# Patient Record
Sex: Male | Born: 1950 | Race: White | Hispanic: No | Marital: Married | Smoking: Never smoker
Health system: Southern US, Community
[De-identification: ages and names within clinical notes are randomized; demographics above are authoritative.]

## PROBLEM LIST (undated history)

## (undated) DIAGNOSIS — D61818 Other pancytopenia: Secondary | ICD-10-CM

## (undated) DIAGNOSIS — D649 Anemia, unspecified: Secondary | ICD-10-CM

## (undated) DIAGNOSIS — I10 Essential (primary) hypertension: Secondary | ICD-10-CM

## (undated) DIAGNOSIS — C801 Malignant (primary) neoplasm, unspecified: Secondary | ICD-10-CM

## (undated) DIAGNOSIS — C61 Malignant neoplasm of prostate: Secondary | ICD-10-CM

## (undated) DIAGNOSIS — F1011 Alcohol abuse, in remission: Secondary | ICD-10-CM

## (undated) DIAGNOSIS — Z8739 Personal history of other diseases of the musculoskeletal system and connective tissue: Secondary | ICD-10-CM

## (undated) DIAGNOSIS — E039 Hypothyroidism, unspecified: Secondary | ICD-10-CM

## (undated) DIAGNOSIS — K219 Gastro-esophageal reflux disease without esophagitis: Secondary | ICD-10-CM

## (undated) DIAGNOSIS — N3281 Overactive bladder: Secondary | ICD-10-CM

## (undated) DIAGNOSIS — K633 Ulcer of intestine: Secondary | ICD-10-CM

## (undated) DIAGNOSIS — G4730 Sleep apnea, unspecified: Secondary | ICD-10-CM

## (undated) DIAGNOSIS — K7460 Unspecified cirrhosis of liver: Secondary | ICD-10-CM

## (undated) DIAGNOSIS — E079 Disorder of thyroid, unspecified: Secondary | ICD-10-CM

## (undated) DIAGNOSIS — K635 Polyp of colon: Secondary | ICD-10-CM

## (undated) DIAGNOSIS — I2510 Atherosclerotic heart disease of native coronary artery without angina pectoris: Secondary | ICD-10-CM

## (undated) DIAGNOSIS — M1990 Unspecified osteoarthritis, unspecified site: Secondary | ICD-10-CM

## (undated) DIAGNOSIS — D500 Iron deficiency anemia secondary to blood loss (chronic): Secondary | ICD-10-CM

## (undated) HISTORY — DX: Essential (primary) hypertension: I10

## (undated) HISTORY — PX: KNEE ARTHROSCOPY: SHX127

## (undated) HISTORY — DX: Malignant neoplasm of prostate: C61

## (undated) HISTORY — PX: BILATERAL TOTAL SHOULDER ARTHROPLASTY: SHX6441

## (undated) HISTORY — DX: Other pancytopenia: D61.818

## (undated) HISTORY — DX: Alcohol abuse, in remission: F10.11

## (undated) HISTORY — DX: Ulcer of intestine: K63.3

## (undated) HISTORY — DX: Atherosclerotic heart disease of native coronary artery without angina pectoris: I25.10

## (undated) HISTORY — PX: KNEE ARTHROSCOPY W/ MENISCAL REPAIR: SHX1877

## (undated) HISTORY — DX: Iron deficiency anemia secondary to blood loss (chronic): D50.0

---

## 2002-07-07 HISTORY — PX: TONSILLECTOMY: SUR1361

## 2008-07-22 ENCOUNTER — Emergency Department: Payer: Self-pay | Admitting: Emergency Medicine

## 2008-07-29 ENCOUNTER — Emergency Department: Payer: Self-pay | Admitting: Emergency Medicine

## 2008-08-24 DIAGNOSIS — I10 Essential (primary) hypertension: Secondary | ICD-10-CM

## 2008-08-24 HISTORY — DX: Essential (primary) hypertension: I10

## 2008-09-22 DIAGNOSIS — I2510 Atherosclerotic heart disease of native coronary artery without angina pectoris: Secondary | ICD-10-CM | POA: Insufficient documentation

## 2009-03-29 ENCOUNTER — Emergency Department: Payer: Self-pay | Admitting: Emergency Medicine

## 2009-09-13 ENCOUNTER — Emergency Department: Payer: Self-pay | Admitting: Emergency Medicine

## 2010-01-11 DIAGNOSIS — K7200 Acute and subacute hepatic failure without coma: Secondary | ICD-10-CM | POA: Insufficient documentation

## 2010-01-11 DIAGNOSIS — E785 Hyperlipidemia, unspecified: Secondary | ICD-10-CM | POA: Insufficient documentation

## 2010-01-11 DIAGNOSIS — F1010 Alcohol abuse, uncomplicated: Secondary | ICD-10-CM | POA: Insufficient documentation

## 2010-01-11 DIAGNOSIS — E039 Hypothyroidism, unspecified: Secondary | ICD-10-CM | POA: Insufficient documentation

## 2010-06-12 ENCOUNTER — Emergency Department: Payer: Self-pay | Admitting: Emergency Medicine

## 2012-09-08 ENCOUNTER — Ambulatory Visit: Payer: Self-pay | Admitting: Cardiology

## 2012-09-09 ENCOUNTER — Ambulatory Visit: Payer: Self-pay | Admitting: Cardiology

## 2013-01-04 ENCOUNTER — Observation Stay: Payer: Self-pay | Admitting: Internal Medicine

## 2013-01-04 LAB — TROPONIN I
Troponin-I: <0.02 ng/mL
Troponin-I: <0.02 ng/mL

## 2013-01-04 LAB — CBC WITH DIFFERENTIAL/PLATELET
Basophil #: 0.1 10*3/uL (ref 0.0–0.1)
Basophil %: 1.2 %
Eosinophil #: 0.2 10*3/uL (ref 0.0–0.7)
Eosinophil %: 3.2 %
HCT: 43.1 % (ref 40.0–52.0)
HGB: 15.1 g/dL (ref 13.0–18.0)
Lymphocyte #: 1.4 10*3/uL (ref 1.0–3.6)
Lymphocyte %: 25 %
MCH: 29.9 pg (ref 26.0–34.0)
MCHC: 34.9 g/dL (ref 32.0–36.0)
MCV: 86 fL (ref 80–100)
Monocyte #: 0.4 x10 3/mm (ref 0.2–1.0)
Monocyte %: 7.3 %
Neutrophil #: 3.6 10*3/uL (ref 1.4–6.5)
Neutrophil %: 63.3 %
Platelet: 114 10*3/uL — ABNORMAL LOW (ref 150–440)
RBC: 5.03 10*6/uL (ref 4.40–5.90)
RDW: 14.7 % — ABNORMAL HIGH (ref 11.5–14.5)
WBC: 5.8 10*3/uL (ref 3.8–10.6)

## 2013-01-04 LAB — CK TOTAL AND CKMB (NOT AT ARMC)
CK, Total: 170 U/L (ref 35–232)
CK-MB: 1.7 ng/mL (ref 0.5–3.6)

## 2013-01-04 LAB — COMPREHENSIVE METABOLIC PANEL
Albumin: 3.7 g/dL (ref 3.4–5.0)
Alkaline Phosphatase: 103 U/L (ref 50–136)
Anion Gap: 5 — ABNORMAL LOW (ref 7–16)
BUN: 13 mg/dL (ref 7–18)
Bilirubin,Total: 0.5 mg/dL (ref 0.2–1.0)
Calcium, Total: 8.9 mg/dL (ref 8.5–10.1)
Chloride: 107 mmol/L (ref 98–107)
Co2: 27 mmol/L (ref 21–32)
Creatinine: 0.95 mg/dL (ref 0.60–1.30)
EGFR (African American): >60
EGFR (Non-African Amer.): >60
Glucose: 96 mg/dL (ref 65–99)
Osmolality: 278 (ref 275–301)
Potassium: 4.2 mmol/L (ref 3.5–5.1)
SGOT(AST): 33 U/L (ref 15–37)
SGPT (ALT): 26 U/L (ref 12–78)
Sodium: 139 mmol/L (ref 136–145)
Total Protein: 6.9 g/dL (ref 6.4–8.2)

## 2013-01-04 LAB — TSH: Thyroid Stimulating Horm: 39.6 u[IU]/mL — ABNORMAL HIGH

## 2013-01-04 LAB — T4, FREE: Free Thyroxine: 0.73 ng/dL — ABNORMAL LOW (ref 0.76–1.46)

## 2013-01-05 LAB — CBC WITH DIFFERENTIAL/PLATELET
Basophil #: 0 10*3/uL (ref 0.0–0.1)
Basophil %: 0.4 %
Eosinophil #: 0.2 10*3/uL (ref 0.0–0.7)
Eosinophil %: 2.9 %
HCT: 42.8 % (ref 40.0–52.0)
HGB: 14.9 g/dL (ref 13.0–18.0)
Lymphocyte #: 1.5 10*3/uL (ref 1.0–3.6)
Lymphocyte %: 23.4 %
MCH: 30.2 pg (ref 26.0–34.0)
MCHC: 34.7 g/dL (ref 32.0–36.0)
MCV: 87 fL (ref 80–100)
Monocyte #: 0.6 x10 3/mm (ref 0.2–1.0)
Monocyte %: 8.4 %
Neutrophil #: 4.3 10*3/uL (ref 1.4–6.5)
Neutrophil %: 64.9 %
Platelet: 106 10*3/uL — ABNORMAL LOW (ref 150–440)
RBC: 4.92 10*6/uL (ref 4.40–5.90)
RDW: 14.9 % — ABNORMAL HIGH (ref 11.5–14.5)
WBC: 6.6 10*3/uL (ref 3.8–10.6)

## 2013-01-05 LAB — COMPREHENSIVE METABOLIC PANEL
Albumin: 3.6 g/dL (ref 3.4–5.0)
Alkaline Phosphatase: 89 U/L (ref 50–136)
Anion Gap: 3 — ABNORMAL LOW (ref 7–16)
BUN: 17 mg/dL (ref 7–18)
Bilirubin,Total: 0.7 mg/dL (ref 0.2–1.0)
Calcium, Total: 8.8 mg/dL (ref 8.5–10.1)
Chloride: 106 mmol/L (ref 98–107)
Co2: 32 mmol/L (ref 21–32)
Creatinine: 1.29 mg/dL (ref 0.60–1.30)
EGFR (African American): >60
EGFR (Non-African Amer.): 60 — ABNORMAL LOW
Glucose: 103 mg/dL — ABNORMAL HIGH (ref 65–99)
Osmolality: 283 (ref 275–301)
Potassium: 4.8 mmol/L (ref 3.5–5.1)
SGOT(AST): 25 U/L (ref 15–37)
SGPT (ALT): 24 U/L (ref 12–78)
Sodium: 141 mmol/L (ref 136–145)
Total Protein: 6.7 g/dL (ref 6.4–8.2)

## 2013-01-05 LAB — CK TOTAL AND CKMB (NOT AT ARMC)
CK, Total: 150 U/L (ref 35–232)
CK-MB: 1.5 ng/mL (ref 0.5–3.6)

## 2013-01-05 LAB — TROPONIN I: Troponin-I: <0.02 ng/mL

## 2013-01-05 LAB — LIPID PANEL
Cholesterol: 156 mg/dL (ref 0–200)
HDL Cholesterol: 31 mg/dL — ABNORMAL LOW (ref 40–60)
Ldl Cholesterol, Calc: 92 mg/dL (ref 0–100)
Triglycerides: 165 mg/dL (ref 0–200)
VLDL Cholesterol, Calc: 33 mg/dL (ref 5–40)

## 2013-02-15 ENCOUNTER — Ambulatory Visit: Payer: Self-pay | Admitting: Specialist

## 2013-02-16 ENCOUNTER — Other Ambulatory Visit: Payer: Self-pay | Admitting: Bariatrics

## 2013-02-16 DIAGNOSIS — I10 Essential (primary) hypertension: Secondary | ICD-10-CM

## 2013-02-16 DIAGNOSIS — E669 Obesity, unspecified: Secondary | ICD-10-CM

## 2013-02-24 ENCOUNTER — Ambulatory Visit
Admission: RE | Admit: 2013-02-24 | Discharge: 2013-02-24 | Disposition: A | Discharge status: Home / Self Care | Payer: 59 | Source: Ambulatory Visit | Attending: Bariatrics | Admitting: Bariatrics

## 2013-02-24 DIAGNOSIS — I10 Essential (primary) hypertension: Secondary | ICD-10-CM

## 2013-02-24 DIAGNOSIS — E669 Obesity, unspecified: Secondary | ICD-10-CM

## 2013-03-11 ENCOUNTER — Ambulatory Visit: Payer: Self-pay | Admitting: Family Medicine

## 2013-04-01 ENCOUNTER — Ambulatory Visit: Payer: Self-pay | Admitting: Gastroenterology

## 2013-04-22 ENCOUNTER — Ambulatory Visit: Payer: Self-pay | Admitting: Gastroenterology

## 2013-04-25 LAB — PATHOLOGY REPORT

## 2013-07-15 ENCOUNTER — Emergency Department: Payer: Self-pay | Admitting: Emergency Medicine

## 2013-07-15 LAB — COMPREHENSIVE METABOLIC PANEL
Albumin: 3.9 g/dL (ref 3.4–5.0)
Alkaline Phosphatase: 111 U/L
Anion Gap: 9 (ref 7–16)
BUN: 12 mg/dL (ref 7–18)
Bilirubin,Total: 0.5 mg/dL (ref 0.2–1.0)
Calcium, Total: 8.8 mg/dL (ref 8.5–10.1)
Chloride: 105 mmol/L (ref 98–107)
Co2: 24 mmol/L (ref 21–32)
Creatinine: 1.08 mg/dL (ref 0.60–1.30)
EGFR (African American): >60
EGFR (Non-African Amer.): >60
Glucose: 78 mg/dL (ref 65–99)
Osmolality: 274 (ref 275–301)
Potassium: 4.2 mmol/L (ref 3.5–5.1)
SGOT(AST): 35 U/L (ref 15–37)
SGPT (ALT): 28 U/L (ref 12–78)
Sodium: 138 mmol/L (ref 136–145)
Total Protein: 7.5 g/dL (ref 6.4–8.2)

## 2013-07-15 LAB — URINALYSIS, COMPLETE
Bacteria: NONE SEEN
Bilirubin,UR: NEGATIVE
Blood: NEGATIVE
Glucose,UR: NEGATIVE mg/dL (ref 0–75)
Ketone: NEGATIVE
Nitrite: NEGATIVE
Ph: 7 (ref 4.5–8.0)
Protein: NEGATIVE
RBC,UR: 1 /HPF (ref 0–5)
Specific Gravity: 1.008 (ref 1.003–1.030)
Squamous Epithelial: 1
WBC UR: 10 /HPF (ref 0–5)

## 2013-07-15 LAB — CBC WITH DIFFERENTIAL/PLATELET
Basophil #: 0.1 10*3/uL (ref 0.0–0.1)
Basophil %: 0.8 %
Eosinophil #: 0.1 10*3/uL (ref 0.0–0.7)
Eosinophil %: 1.6 %
HCT: 46.4 % (ref 40.0–52.0)
HGB: 15.9 g/dL (ref 13.0–18.0)
Lymphocyte #: 1.6 10*3/uL (ref 1.0–3.6)
Lymphocyte %: 22.1 %
MCH: 30 pg (ref 26.0–34.0)
MCHC: 34.2 g/dL (ref 32.0–36.0)
MCV: 88 fL (ref 80–100)
Monocyte #: 0.5 x10 3/mm (ref 0.2–1.0)
Monocyte %: 7 %
Neutrophil #: 5 10*3/uL (ref 1.4–6.5)
Neutrophil %: 68.5 %
Platelet: 127 10*3/uL — ABNORMAL LOW (ref 150–440)
RBC: 5.3 10*6/uL (ref 4.40–5.90)
RDW: 14.9 % — ABNORMAL HIGH (ref 11.5–14.5)
WBC: 7.3 10*3/uL (ref 3.8–10.6)

## 2013-07-15 LAB — TROPONIN I: Troponin-I: <0.02 ng/mL

## 2013-07-15 LAB — LIPASE, BLOOD: Lipase: 192 U/L (ref 73–393)

## 2013-07-19 ENCOUNTER — Ambulatory Visit: Payer: Self-pay | Admitting: Gastroenterology

## 2013-07-22 LAB — PATHOLOGY REPORT

## 2013-09-12 ENCOUNTER — Ambulatory Visit: Payer: Self-pay | Admitting: Specialist

## 2013-10-05 ENCOUNTER — Ambulatory Visit: Payer: Self-pay | Admitting: Specialist

## 2013-11-14 ENCOUNTER — Ambulatory Visit: Payer: Self-pay | Admitting: Specialist

## 2013-11-14 LAB — APTT: Activated PTT: 30.9 secs (ref 23.6–35.9)

## 2013-11-14 LAB — PROTIME-INR
INR: 1
Prothrombin Time: 13.4 secs (ref 11.5–14.7)

## 2014-01-19 DIAGNOSIS — E669 Obesity, unspecified: Secondary | ICD-10-CM | POA: Insufficient documentation

## 2014-07-07 HISTORY — PX: GASTRIC BYPASS: SHX52

## 2014-10-27 NOTE — Discharge Summary (Signed)
PATIENT NAME:  Dylan Ho, Dylan Ho MR#:  [id number] DATE OF BIRTH:  11/22/1950  DATE OF ADMISSION:  01/04/2013 DATE OF DISCHARGE:  01/05/2013  PRESENTING COMPLAINT: Chest pain.   DISCHARGE DIAGNOSES:  1.  Chest pain, appears noncardiac.  2.  Morbid obesity.  3.  Alcohol-induced liver disease.   PROCEDURES: Cath, no significant CAD.   CODE STATUS: Full code.   MEDICATIONS:  1.  Coreg 25 mg daily.  2.  Lasix 40 mg daily as needed.  3.  Lactulose 15 mL orally daily.  4.  Levothyroxine 175 mcg daily.   FOLLOWUP: With Dr. Ubaldo Glassing in 2 to 4 weeks as needed.   STUDIES: CBC within normal limits except platelet count of 106. Comprehensive metabolic panel within normal limits. Cardiac enzymes x 3 negative. Lipid profile within normal limits. TSH was 39.6.   BRIEF SUMMARY OF HOSPITAL COURSE: [name] is a 64 year old, morbidly obese, Caucasian gentleman, who comes to the Emergency Room with:  1.  Chest pain. He was admitted on the telemetry floor and ruled out for acute MI. He had a recent negative stress test. Cardiology consultation was obtained with Dr. Ubaldo Glassing. The patient underwent cardiac cath that did not show any significant coronary artery disease. The patient remained asymptomatic thereafter.  2.  Hypothyroidism. Synthroid dosage was increased since his TSH is 39.  3.  Hypertension. Continued Coreg.  4.  Alcoholic liver disease. Followed lactulose daily. Lactulose was continued along with p.r.n. Lasix. Hospital stay otherwise remained stable. The patient remained a full code.   TIME SPENT: 50 minutes.  ____________________________ Hart Rochester Posey Pronto, MD sap:aw D: 01/06/2013 07:18:42 ET T: 01/06/2013 07:32:45 ET JOB#: [id number]  cc: Sarahjane Matherly A. Posey Pronto, MD, <Dictator> Ilda Basset MD ELECTRONICALLY SIGNED 01/24/2013 18:56
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
## 2014-10-27 NOTE — Consult Note (Signed)
   Present Illness 64 yo male admitted with chest pain. Pt has had history of intermittant chest pain in the past. He has had cardiac cath procedures in the distant past which have not revealed any signficant cad. He recently underwent a funcitonal study in preperation for consideration for gastic bypass surgery. This was limited by poor exercize tolerance. Echo revealed preserved lv funciton. He has recurrent chest pain. He has ruled out for an mi thus far but has continued rest chest pain. EKG unremarkable for ischemia   Physical Exam:  GEN morbidly obesse   HEENT PERRL, hearing intact to voice   NECK supple   RESP normal resp effort  no use of accessory muscles   CARD Regular rate and rhythm  Normal, S1, S2  No murmur   ABD denies tenderness  difficult to evaluate secondary to obesity   LYMPH negative neck, negative axillae   EXTR negative cyanosis/clubbing, negative edema   SKIN normal to palpation, No ulcers   NEURO cranial nerves intact, motor/sensory function intact   PSYCH A+O to time, place, person   Review of Systems:  Subjective/Chief Complaint chest pain   General: No Complaints   Skin: No Complaints   ENT: No Complaints   Eyes: No Complaints   Neck: No Complaints   Respiratory: Short of breath   Cardiovascular: Chest pain or discomfort   Gastrointestinal: No Complaints   Genitourinary: No Complaints   Vascular: No Complaints   Musculoskeletal: No Complaints   Neurologic: No Complaints   Hematologic: No Complaints   Endocrine: No Complaints   Psychiatric: No Complaints   Review of Systems: All other systems were reviewed and found to be negative   Medications/Allergies Reviewed Medications/Allergies reviewed     Hypothyroidism:    Hypercholesterolemia:    htn:    heart disease:    cardiac cath:   Home Medications: Medication Instructions Status  Synthroid 150 mcg (0.15 mg) oral tablet 1 tab(s) orally once a day Active  Coreg 25  mg oral tablet 1 tab(s) orally once a day Active  Lasix 40 mg oral tablet 1 tab(s) orally once a day, As Needed Active  lactulose 10 g/15 mL oral syrup 15 milliliter(s) orally once a day, As Needed Active   EKG:  EKG NSR    No Known Allergies:    Impression 64 yo male with exogenous obesity who is admitted iwth rest chest pian. He has ruled out for an mi. He had a recentl negative funcitonal study but continues to have chest pain at rest. He is being considerred for gastric bypass. Will need cardiac cath to evaluate coronary anatomy to guide further therapy   Plan 1. rule out fo rmi 2. Continue current meds 3. Risk and benefits of cardiac cath discussed with patient 4. Proceed iwth cardiac cath in am 5. Further recs after cath   Electronic Signatures: Teodoro Spray (MD)  (Signed 01-Jul-14 21:33)  Authored: General Aspect/Present Illness, History and Physical Exam, Review of System, Past Medical History, Home Medications, EKG , Allergies, Impression/Plan   Last Updated: 01-Jul-14 21:33 by Teodoro Spray (MD)

## 2014-12-26 ENCOUNTER — Emergency Department
Admission: EM | Admit: 2014-12-26 | Discharge: 2014-12-26 | Disposition: A | Discharge status: Home / Self Care | Payer: No Typology Code available for payment source | Attending: Emergency Medicine | Admitting: Emergency Medicine

## 2014-12-26 ENCOUNTER — Encounter: Payer: Self-pay | Admitting: Emergency Medicine

## 2014-12-26 DIAGNOSIS — Y9389 Activity, other specified: Secondary | ICD-10-CM | POA: Insufficient documentation

## 2014-12-26 DIAGNOSIS — Z79899 Other long term (current) drug therapy: Secondary | ICD-10-CM | POA: Diagnosis not present

## 2014-12-26 DIAGNOSIS — S63601A Unspecified sprain of right thumb, initial encounter: Secondary | ICD-10-CM

## 2014-12-26 DIAGNOSIS — S0990XA Unspecified injury of head, initial encounter: Secondary | ICD-10-CM | POA: Insufficient documentation

## 2014-12-26 DIAGNOSIS — Y998 Other external cause status: Secondary | ICD-10-CM | POA: Insufficient documentation

## 2014-12-26 DIAGNOSIS — S6991XA Unspecified injury of right wrist, hand and finger(s), initial encounter: Secondary | ICD-10-CM | POA: Diagnosis present

## 2014-12-26 DIAGNOSIS — S199XXA Unspecified injury of neck, initial encounter: Secondary | ICD-10-CM | POA: Insufficient documentation

## 2014-12-26 DIAGNOSIS — Y92410 Unspecified street and highway as the place of occurrence of the external cause: Secondary | ICD-10-CM | POA: Insufficient documentation

## 2014-12-26 DIAGNOSIS — I10 Essential (primary) hypertension: Secondary | ICD-10-CM | POA: Insufficient documentation

## 2014-12-26 HISTORY — DX: Essential (primary) hypertension: I10

## 2014-12-26 HISTORY — DX: Disorder of thyroid, unspecified: E07.9
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
## 2015-02-05 ENCOUNTER — Ambulatory Visit
Admission: RE | Admit: 2015-02-05 | Discharge: 2015-02-05 | Disposition: A | Discharge status: Home / Self Care | Payer: No Typology Code available for payment source | Source: Ambulatory Visit | Attending: Chiropractor | Admitting: Chiropractor

## 2015-02-05 ENCOUNTER — Other Ambulatory Visit: Payer: Self-pay | Admitting: Chiropractor

## 2015-02-05 DIAGNOSIS — M79644 Pain in right finger(s): Secondary | ICD-10-CM

## 2015-02-05 DIAGNOSIS — M19041 Primary osteoarthritis, right hand: Secondary | ICD-10-CM | POA: Insufficient documentation

## 2015-02-05 DIAGNOSIS — M79641 Pain in right hand: Secondary | ICD-10-CM

## 2015-02-05 DIAGNOSIS — M1811 Unilateral primary osteoarthritis of first carpometacarpal joint, right hand: Secondary | ICD-10-CM | POA: Diagnosis not present

## 2016-03-15 ENCOUNTER — Emergency Department: Payer: BLUE CROSS/BLUE SHIELD

## 2016-03-15 ENCOUNTER — Emergency Department
Admission: EM | Admit: 2016-03-15 | Discharge: 2016-03-15 | Disposition: A | Discharge status: Home / Self Care | Payer: BLUE CROSS/BLUE SHIELD | Attending: Emergency Medicine | Admitting: Emergency Medicine

## 2016-03-15 ENCOUNTER — Encounter: Payer: Self-pay | Admitting: Emergency Medicine

## 2016-03-15 DIAGNOSIS — Z79899 Other long term (current) drug therapy: Secondary | ICD-10-CM | POA: Insufficient documentation

## 2016-03-15 DIAGNOSIS — R1013 Epigastric pain: Secondary | ICD-10-CM | POA: Diagnosis present

## 2016-03-15 DIAGNOSIS — K8020 Calculus of gallbladder without cholecystitis without obstruction: Secondary | ICD-10-CM | POA: Diagnosis not present

## 2016-03-15 DIAGNOSIS — R1011 Right upper quadrant pain: Secondary | ICD-10-CM

## 2016-03-15 DIAGNOSIS — I10 Essential (primary) hypertension: Secondary | ICD-10-CM | POA: Diagnosis not present

## 2016-03-15 DIAGNOSIS — I2510 Atherosclerotic heart disease of native coronary artery without angina pectoris: Secondary | ICD-10-CM | POA: Diagnosis not present

## 2016-03-15 LAB — CBC
HCT: 45.4 % (ref 40.0–52.0)
Hemoglobin: 15.9 g/dL (ref 13.0–18.0)
MCH: 38.3 pg — ABNORMAL HIGH (ref 26.0–34.0)
MCHC: 35 g/dL (ref 32.0–36.0)
MCV: 109.3 fL — ABNORMAL HIGH (ref 80.0–100.0)
Platelets: 118 10*3/uL — ABNORMAL LOW (ref 150–440)
RBC: 4.15 MIL/uL — ABNORMAL LOW (ref 4.40–5.90)
RDW: 15.4 % — ABNORMAL HIGH (ref 11.5–14.5)
WBC: 7.9 10*3/uL (ref 3.8–10.6)

## 2016-03-15 LAB — COMPREHENSIVE METABOLIC PANEL
ALT: 15 U/L — ABNORMAL LOW (ref 17–63)
AST: 28 U/L (ref 15–41)
Albumin: 4.9 g/dL (ref 3.5–5.0)
Alkaline Phosphatase: 82 U/L (ref 38–126)
Anion gap: 10 (ref 5–15)
BUN: 21 mg/dL — ABNORMAL HIGH (ref 6–20)
CO2: 26 mmol/L (ref 22–32)
Calcium: 9.3 mg/dL (ref 8.9–10.3)
Chloride: 100 mmol/L — ABNORMAL LOW (ref 101–111)
Creatinine, Ser: 1.07 mg/dL (ref 0.61–1.24)
GFR calc Af Amer: >60 mL/min (ref >60)
GFR calc non Af Amer: >60 mL/min (ref >60)
Glucose, Bld: 97 mg/dL (ref 65–99)
Potassium: 4 mmol/L (ref 3.5–5.1)
Sodium: 136 mmol/L (ref 135–145)
Total Bilirubin: 0.7 mg/dL (ref 0.3–1.2)
Total Protein: 8 g/dL (ref 6.5–8.1)

## 2016-03-15 LAB — TROPONIN I: Troponin I: <0.03 ng/mL (ref <0.03)

## 2016-03-15 LAB — LIPASE, BLOOD: Lipase: 42 U/L (ref 11–51)

## 2016-03-15 MED ORDER — ONDANSETRON 4 MG PO TBDP: 4.0000 mg | ORAL_TABLET | Freq: Three times a day (TID) | ORAL | 0 refills | Status: DC | PRN

## 2016-03-15 MED ORDER — MORPHINE SULFATE (PF) 4 MG/ML IV SOLN
INTRAVENOUS | Status: AC
  Administered 2016-03-15: 4 mg via INTRAVENOUS
  Filled 2016-03-15: qty 1

## 2016-03-15 MED ORDER — MORPHINE SULFATE (PF) 4 MG/ML IV SOLN
4.0000 mg | Freq: Once | INTRAVENOUS | Status: AC
  Administered 2016-03-15: 4 mg via INTRAVENOUS

## 2016-03-15 MED ORDER — OXYCODONE HCL 5 MG PO TABS: 5.0000 mg | ORAL_TABLET | Freq: Four times a day (QID) | ORAL | 0 refills | Status: DC | PRN

## 2016-03-15 MED ORDER — MORPHINE SULFATE (PF) 4 MG/ML IV SOLN
4.0000 mg | Freq: Once | INTRAVENOUS | Status: AC
  Administered 2016-03-15: 4 mg via INTRAVENOUS
  Filled 2016-03-15: qty 1

## 2016-03-15 MED ORDER — ONDANSETRON HCL 4 MG/2ML IJ SOLN
INTRAMUSCULAR | Status: AC
  Administered 2016-03-15: 4 mg via INTRAVENOUS
  Filled 2016-03-15: qty 2

## 2016-03-15 MED ORDER — ONDANSETRON HCL 4 MG/2ML IJ SOLN
4.0000 mg | Freq: Once | INTRAMUSCULAR | Status: AC
  Administered 2016-03-15: 4 mg via INTRAVENOUS

## 2016-03-15 NOTE — ED Provider Notes (Signed)
-----------------------------------------   8:40 AM on 03/15/2016 -----------------------------------------  Care was assumed from Dr. Owens Shark at 7 AM pending results of the right upper quadrant ultrasound. Briefly this is a 65 year old male who presented for evaluation of one week of intermittent epigastric and right upper quadrant abdominal pain worse after eating, found to have right upper quadrant tenderness on exam. US shows multiple gallstones which is the most likely cause of his pain, no evidence of cholecystitis. EKG not consistent with ischemia, troponin was negative and ACS is thought to be unlikely. Patient reports he feels much better at this time, tolerating by mouth intake without vomiting. We discussed meticulous return precautions, need for close general surgery follow-up and he is comfortable with the discharge plan. DC home.  [place] RUQ IMPRESSION: Multiple mobile gallstones are demonstrated within the gallbladder lumen. The gallbladder appears mildly distended however no additional secondary signs of acute cholecystitis are identified   Joanne Gavel, MD 03/15/16 [phone]

## 2016-03-15 NOTE — ED Triage Notes (Addendum)
Pt reports epigastric pain x 1 week, worse over past 2 days, reports some dizziness, reports radiation up to chest.  Pt denies n/v.  Pt reports hx of angina and gastric bypass.  Pt in visible discomfort at this time.

## 2016-03-15 NOTE — ED Notes (Signed)
Dr. Brown at bedside

## 2016-03-15 NOTE — ED Provider Notes (Signed)
[facility] Emergency Department Provider Note   First MD Initiated Contact with Patient 03/15/16 [phone]     (approximate)  I have reviewed the triage vital signs and the nursing notes.   HISTORY  Chief Complaint Abdominal Pain   HPI Dylan Ho is a 65 y.o. male history gastric bypass surgery, angina presents to the emergency department with intermittent epigastric pain times approximately one week. Patient denies any nausea or vomiting. Patient does admit that the pain is worse after eating. Patient denies any dyspnea. Patient states that his current pain score is 10 out of 10  Past Medical History:  Diagnosis Date  . Hypertension   . Thyroid disease     There are no active problems to display for this patient.   Past Surgical History:  Procedure Laterality Date  . GASTRIC BYPASS      Prior to Admission medications   Medication Sig Start Date End Date Taking? Authorizing Provider  levothyroxine (SYNTHROID, LEVOTHROID) 100 MCG tablet Take 100 mcg by mouth daily before breakfast.   Yes Historical Provider, MD    Allergies No known drug allergies History reviewed. No pertinent family history.  Social History Social History  Substance Use Topics  . Smoking status: Never Smoker  . Smokeless tobacco: Never Used  . Alcohol use No    Review of Systems Constitutional: No fever/chills Eyes: No visual changes. ENT: No sore throat. Cardiovascular: Denies chest pain. Respiratory: Denies shortness of breath. Gastrointestinal: Positive for abdominal pain  No nausea, no vomiting.  No diarrhea.  No constipation. Genitourinary: Negative for dysuria. Musculoskeletal: Negative for back pain. Skin: Negative for rash. Neurological: Negative for headaches, focal weakness or numbness.  10-point ROS otherwise negative.  ____________________________________________   PHYSICAL EXAM:  VITAL SIGNS: ED Triage Vitals  Enc Vitals Group     BP  03/15/16 0529 (!) 188/100     Pulse Rate 03/15/16 0529 73     Resp 03/15/16 0529 19     Temp 03/15/16 0529 97.6 F (36.4 C)     Temp src --      SpO2 03/15/16 0529 100 %     Weight 03/15/16 0522 270 lb (122.5 kg)     Height 03/15/16 0522 6' (1.829 m)     Head Circumference --      Peak Flow --      Pain Score 03/15/16 0522 10     Pain Loc --      Pain Edu? --      Excl. in [place]? --     Constitutional: Alert and oriented. Apparent discomfort Eyes: Conjunctivae are normal. PERRL. EOMI. Head: Atraumatic. Ears:  Healthy appearing ear canals and TMs bilaterally Mouth/Throat: Mucous membranes are moist.  Oropharynx non-erythematous. Neck: No stridor.  No meningeal signs.  Cardiovascular: Normal rate, regular rhythm. Good peripheral circulation. Grossly normal heart sounds. Respiratory: Normal respiratory effort.  No retractions. Lungs CTAB. Gastrointestinal: Right upper quadrant tenderness to palpation. No distention.  Musculoskeletal: No lower extremity tenderness nor edema. No gross deformities of extremities. Neurologic:  Normal speech and language. No gross focal neurologic deficits are appreciated.  Skin:  Skin is warm, dry and intact. No rash noted. Psychiatric: Mood and affect are normal. Speech and behavior are normal.  ____________________________________________   LABS (all labs ordered are listed, but only abnormal results are displayed)  Labs Reviewed  COMPREHENSIVE METABOLIC PANEL - Abnormal; Notable for the following:       Result Value   Chloride 100 (*)  BUN 21 (*)    ALT 15 (*)    All other components within normal limits  CBC - Abnormal; Notable for the following:    RBC 4.15 (*)    MCV 109.3 (*)    MCH 38.3 (*)    RDW 15.4 (*)    Platelets 118 (*)    All other components within normal limits  LIPASE, BLOOD  TROPONIN I   ____________________________________________  EKG  ED ECG REPORT I, [place] N Coda Filler, the attending physician, personally viewed  and interpreted this ECG.   Date: 03/15/2016  EKG Time: 5:23 AM  Rate: 77  Rhythm: Normal Sinus rhythm  Axis: Normal  Intervals: Normal  ST&T Change: None  ____________________________________________  RADIOLOGY I, Chase N Faustino Luecke, personally viewed and evaluated these images (plain radiographs) as part of my medical decision making, as well as reviewing the written report by the radiologist.  No results found.    Procedures     INITIAL IMPRESSION / ASSESSMENT AND PLAN / ED COURSE  Pertinent labs & imaging results that were available during my care of the patient were reviewed by me and considered in my medical decision making (see chart for details).  Patient given IV morphine 4 mg and IV Zofran 4 mg. History of physical exam consistent with possible gallstones as such ultrasound has been ordered and is pending patient's care transferred to Dr. Madaline Savage   Clinical Course    ____________________________________________  FINAL CLINICAL IMPRESSION(S) / ED DIAGNOSES  Final diagnoses:  Right upper quadrant pain  Calculus of gallbladder without cholecystitis without obstruction     MEDICATIONS GIVEN DURING THIS VISIT:  Medications  morphine 4 MG/ML injection 4 mg (4 mg Intravenous Given 03/15/16 0533)  ondansetron (ZOFRAN) injection 4 mg (4 mg Intravenous Given 03/15/16 0533)     NEW OUTPATIENT MEDICATIONS STARTED DURING THIS VISIT:  New Prescriptions   No medications on file    Modified Medications   No medications on file    Discontinued Medications   No medications on file     Note:  This document was prepared using Dragon voice recognition software and may include unintentional dictation errors.    Gregor Hams, MD 03/16/16 [phone]

## 2016-03-15 NOTE — ED Notes (Signed)
Pt verbalized understanding of discharge instructions. NAD at this time. 

## 2016-03-15 NOTE — ED Notes (Signed)
Pt's o2sat noted to be 88% with good waveform, pt placed on 2L oxygen via [place].  Pt also c/o tingling in feet bilaterally, pedal pulses strong bilat, sensation and motor intact, cap refill 4 sec

## 2016-03-17 ENCOUNTER — Encounter: Payer: Self-pay | Admitting: Surgery

## 2016-03-17 ENCOUNTER — Ambulatory Visit (INDEPENDENT_AMBULATORY_CARE_PROVIDER_SITE_OTHER): Payer: BLUE CROSS/BLUE SHIELD | Admitting: Surgery

## 2016-03-17 ENCOUNTER — Other Ambulatory Visit
Admission: RE | Admit: 2016-03-17 | Discharge: 2016-03-17 | Disposition: A | Discharge status: Home / Self Care | Payer: BLUE CROSS/BLUE SHIELD | Source: Ambulatory Visit | Attending: Surgery | Admitting: Surgery

## 2016-03-17 VITALS — BP 127/79 | HR 88 | Temp 98.5°F | Resp 18 | Ht 72.0 in | Wt 280.0 lb

## 2016-03-17 DIAGNOSIS — R109 Unspecified abdominal pain: Secondary | ICD-10-CM | POA: Diagnosis not present

## 2016-03-17 LAB — COMPREHENSIVE METABOLIC PANEL
ALT: 15 U/L — ABNORMAL LOW (ref 17–63)
AST: 27 U/L (ref 15–41)
Albumin: 4.3 g/dL (ref 3.5–5.0)
Alkaline Phosphatase: 84 U/L (ref 38–126)
Anion gap: 7 (ref 5–15)
BUN: 20 mg/dL (ref 6–20)
CO2: 28 mmol/L (ref 22–32)
Calcium: 9.4 mg/dL (ref 8.9–10.3)
Chloride: 100 mmol/L — ABNORMAL LOW (ref 101–111)
Creatinine, Ser: 1.2 mg/dL (ref 0.61–1.24)
GFR calc Af Amer: >60 mL/min (ref >60)
GFR calc non Af Amer: >60 mL/min (ref >60)
Glucose, Bld: 109 mg/dL — ABNORMAL HIGH (ref 65–99)
Potassium: 4.1 mmol/L (ref 3.5–5.1)
Sodium: 135 mmol/L (ref 135–145)
Total Bilirubin: 1.1 mg/dL (ref 0.3–1.2)
Total Protein: 7.3 g/dL (ref 6.5–8.1)

## 2016-03-17 LAB — CBC WITH DIFFERENTIAL/PLATELET
Basophils Absolute: 0 10*3/uL (ref 0–0.1)
Basophils Relative: 0 %
Eosinophils Absolute: 0.1 10*3/uL (ref 0–0.7)
Eosinophils Relative: 1 %
HCT: 41.5 % (ref 40.0–52.0)
Hemoglobin: 13.9 g/dL (ref 13.0–18.0)
Lymphocytes Relative: 23 %
Lymphs Abs: 1.3 10*3/uL (ref 1.0–3.6)
MCH: 37.1 pg — ABNORMAL HIGH (ref 26.0–34.0)
MCHC: 33.6 g/dL (ref 32.0–36.0)
MCV: 110.6 fL — ABNORMAL HIGH (ref 80.0–100.0)
Monocytes Absolute: 0.5 10*3/uL (ref 0.2–1.0)
Monocytes Relative: 9 %
Neutro Abs: 3.8 10*3/uL (ref 1.4–6.5)
Neutrophils Relative %: 67 %
Platelets: 111 10*3/uL — ABNORMAL LOW (ref 150–440)
RBC: 3.75 MIL/uL — ABNORMAL LOW (ref 4.40–5.90)
RDW: 14.9 % — ABNORMAL HIGH (ref 11.5–14.5)
WBC: 5.7 10*3/uL (ref 3.8–10.6)

## 2016-03-17 LAB — PROTIME-INR
INR: 0.97
Prothrombin Time: 12.9 seconds (ref 11.4–15.2)

## 2016-03-17 LAB — APTT: aPTT: 32 seconds (ref 24–36)

## 2016-03-17 NOTE — Patient Instructions (Addendum)
Please stop by the lab downstairs prior to leaving today.

## 2016-03-17 NOTE — Progress Notes (Signed)
Patient ID: Dylan Ho, male   DOB: December 21, 1950, 65 y.o.   MRN: [id number]  HPI Dylan Ho is a 65 y.o. male patient seen for a abdominal pain for the last 10 days or so. He reports that his pain is both in the right upper quadrant and epigastric area. It is moderate to severe in intensity and is intermittent and apparently worsening with meals. He did have some nausea. A couple days ago went to the emergency room work workup revealed evidence of mobile gallstones without cholecystitis. Normal common bile duct( to some personal reviewed). Lab work reviewed and and there is evidence of mild thrombocytopenia. And at that time in the emergency room at the troponins were negative as well as EKG showed no evidence of ischemia. His rectum was normal. He did have a history of a lap or scopic Roux-en-Y gastric bypass in 2015 by Dr. Darnell Level. He has lost approximately 120 pounds since then. He also reports that before he had some angina and some dyspnea on exertion and this has disappeared after a gastric bypass Did have a history of alcoholic cirrhosis and the last time he had a drink was in 2011. Given his gastric bypass there is still some and evidence of mild cirrhosis.   HPI  Past Medical History:  Diagnosis Date  . CAD (coronary artery disease)    No Stents Present- per patient  . History of alcohol abuse Last Drink- 2011  . Hypertension   . Thyroid disease     Past Surgical History:  Procedure Laterality Date  . BILATERAL TOTAL SHOULDER ARTHROPLASTY  1990's  . GASTRIC BYPASS  2016   Dr. Darnell Level- [place], [place]  . REPLACEMENT TOTAL KNEE BILATERAL  1990's  . TONSILLECTOMY  2004    Family History  Problem Relation Age of Onset  . Cancer Mother     Breast  . Heart disease Father   . Stroke Father   . AAA (abdominal aortic aneurysm) Father     Social History Social History  Substance Use Topics  . Smoking status: Never Smoker  . Smokeless tobacco: Never Used  . Alcohol use No   Comment: Heavy ETOH in past- Last Drink 2011 per patient    No Known Allergies  Current Outpatient Prescriptions  Medication Sig Dispense Refill  . levothyroxine (SYNTHROID, LEVOTHROID) 175 MCG tablet Take 175 mcg by mouth daily before breakfast.    . ondansetron (ZOFRAN ODT) 4 MG disintegrating tablet Take 1 tablet (4 mg total) by mouth every 8 (eight) hours as needed for nausea or vomiting. 10 tablet 0  . oxyCODONE (ROXICODONE) 5 MG immediate release tablet Take 1 tablet (5 mg total) by mouth every 6 (six) hours as needed for moderate pain. Do not drive while taking this medication. 12 tablet 0   No current facility-administered medications for this visit.      Review of Systems A 10 point review of systems was asked and was negative except for the information on the HPI  Physical Exam Blood pressure 127/79, pulse 88, temperature 98.5 F (36.9 C), temperature source Oral, resp. rate 18, height 6' (1.829 m), weight 127 kg (280 lb). CONSTITUTIONAL: NAD, obese male EYES: Pupils are equal, round, and reactive to light, Sclera are non-icteric. EARS, NOSE, MOUTH AND THROAT: The oropharynx is clear. The oral mucosa is pink and moist. Hearing is intact to voice. LYMPH NODES:  Lymph nodes in the neck are normal. RESPIRATORY:  Lungs are clear. There is normal respiratory effort, with  equal breath sounds bilaterally, and without pathologic use of accessory muscles. CARDIOVASCULAR: Heart is regular without murmurs, gallops, or rubs. GI: The abdomen is soft,Tender on epigastric area, no peritonitis, no murphy GU: Rectal deferred.   MUSCULOSKELETAL: Normal muscle strength and tone. No cyanosis or edema.   SKIN: Turgor is good and there are no pathologic skin lesions or ulcers. NEUROLOGIC: Motor and sensation is grossly normal. Cranial nerves are grossly intact. PSYCH:  Oriented to person, place and time. Affect is normal.  Data Reviewed U/S and labs I have personally reviewed the patient's  imaging, laboratory findings and medical records.    Assessment/Plan Abdominal pain in a patient with a hx of gastric bypass. Discussed with the patient in detail that  this can certainly be symptomatic cholelithiasis but before we can establish that the gallstones are the culprit  We will  need to rule out any internal hernia or any problems related to his gastric bypass surgery. I will go ahead and schedule him for a CT scan of the abdomen and pelvis tonight and I'll reassess him in the morning. Currently there is no need for any immediate surgical intervention at this time. He is nontoxic and I think it is reasonable for [place] to wait for the CT scan tonight and clinical reevaluation in the morning. Discussed with the patient in detail and extensive counseling was provided  Caroleen Hamman, MD FACS General Surgeon 03/17/2016, 4:35 PM

## 2016-03-18 ENCOUNTER — Encounter: Payer: Self-pay | Admitting: Surgery

## 2016-03-18 ENCOUNTER — Ambulatory Visit (INDEPENDENT_AMBULATORY_CARE_PROVIDER_SITE_OTHER): Payer: BLUE CROSS/BLUE SHIELD | Admitting: Surgery

## 2016-03-18 ENCOUNTER — Ambulatory Visit: Payer: Self-pay | Admitting: Surgery

## 2016-03-18 ENCOUNTER — Ambulatory Visit
Admission: RE | Admit: 2016-03-18 | Discharge: 2016-03-18 | Disposition: A | Discharge status: Home / Self Care | Payer: BLUE CROSS/BLUE SHIELD | Source: Ambulatory Visit | Attending: Surgery | Admitting: Surgery

## 2016-03-18 VITALS — BP 156/95 | HR 71 | Temp 97.7°F | Ht 72.0 in | Wt 283.0 lb

## 2016-03-18 DIAGNOSIS — N289 Disorder of kidney and ureter, unspecified: Secondary | ICD-10-CM | POA: Diagnosis not present

## 2016-03-18 DIAGNOSIS — K828 Other specified diseases of gallbladder: Secondary | ICD-10-CM | POA: Insufficient documentation

## 2016-03-18 DIAGNOSIS — R1013 Epigastric pain: Secondary | ICD-10-CM | POA: Diagnosis not present

## 2016-03-18 DIAGNOSIS — R161 Splenomegaly, not elsewhere classified: Secondary | ICD-10-CM | POA: Diagnosis not present

## 2016-03-18 DIAGNOSIS — R109 Unspecified abdominal pain: Secondary | ICD-10-CM

## 2016-03-18 DIAGNOSIS — K7460 Unspecified cirrhosis of liver: Secondary | ICD-10-CM | POA: Diagnosis not present

## 2016-03-18 DIAGNOSIS — Z9884 Bariatric surgery status: Secondary | ICD-10-CM | POA: Diagnosis not present

## 2016-03-18 DIAGNOSIS — I700 Atherosclerosis of aorta: Secondary | ICD-10-CM | POA: Insufficient documentation

## 2016-03-18 MED ORDER — IOPAMIDOL (ISOVUE-300) INJECTION 61%: 100.0000 mL | Freq: Once | INTRAVENOUS | Status: DC | PRN

## 2016-03-18 MED ORDER — IOPAMIDOL (ISOVUE-370) INJECTION 76%
125.0000 mL | Freq: Once | INTRAVENOUS | Status: AC | PRN
  Administered 2016-03-18: 100 mL via INTRAVENOUS

## 2016-03-18 NOTE — Patient Instructions (Addendum)
We will refer you to [facility] to a Hepatobiliary specialist. We will call you with the details of this appointment. If you do not hear from our office within 5-7 days.

## 2016-03-18 NOTE — Progress Notes (Signed)
Outpatient Surgical Follow Up  03/18/2016  Dylan Ho is an 65 y.o. male.   Chief Complaint  Patient presents with  . Follow-up    Abdominal pain-Ct scheduled today    HPI: Mr Dylan Ho is following up for atypical abdominal pain. His CT scan of the abdomen and pelvis showed a distended gallbladder with small stones. No evidence of cholecystitis. No evidence of internal hernias or apparent complications from his gastric bypass. There is no evidence of ascites but there is evidence of cirrhosis and splenomegaly. His platelet count is also in the 110s. He feels better this morning but is still having some intermittent abdominal pain. Coags and creat nml  Past Medical History:  Diagnosis Date  . CAD (coronary artery disease)    No Stents Present- per patient  . History of alcohol abuse Last Drink- 2011  . Hypertension   . Thyroid disease     Past Surgical History:  Procedure Laterality Date  . BILATERAL TOTAL SHOULDER ARTHROPLASTY  1990's  . GASTRIC BYPASS  2016   Dr. Darnell Level- [place], [place]  . REPLACEMENT TOTAL KNEE BILATERAL  1990's  . TONSILLECTOMY  2004    Family History  Problem Relation Age of Onset  . Cancer Mother     Breast  . Heart disease Father   . Stroke Father   . AAA (abdominal aortic aneurysm) Father     Social History:  reports that he has never smoked. He has never used smokeless tobacco. He reports that he does not drink alcohol or use drugs.  Allergies: No Known Allergies  Medications reviewed.    ROS Full ROS performed and is otherwise negative other than what is stated in the history of present illness   BP (!) 156/95   Pulse 71   Temp 97.7 F (36.5 C) (Oral)   Ht 6' (1.829 m)   Wt 128.4 kg (283 lb)   BMI 38.38 kg/m   Physical Exam NAD, alert, morbidly obese Abd: soft, mild TTP epigastric area, no murphy and no peritonitis   Results for orders placed or performed during the hospital encounter of 03/17/16 (from the past 48 hour(s))   Comprehensive metabolic panel     Status: Abnormal   Collection Time: 03/17/16  5:48 PM  Result Value Ref Range   Sodium 135 135 - 145 mmol/L   Potassium 4.1 3.5 - 5.1 mmol/L   Chloride 100 (L) 101 - 111 mmol/L   CO2 28 22 - 32 mmol/L   Glucose, Bld 109 (H) 65 - 99 mg/dL   BUN 20 6 - 20 mg/dL   Creatinine, Ser 1.20 0.61 - 1.24 mg/dL   Calcium 9.4 8.9 - 10.3 mg/dL   Total Protein 7.3 6.5 - 8.1 g/dL   Albumin 4.3 3.5 - 5.0 g/dL   AST 27 15 - 41 U/L   ALT 15 (L) 17 - 63 U/L   Alkaline Phosphatase 84 38 - 126 U/L   Total Bilirubin 1.1 0.3 - 1.2 mg/dL   GFR calc non Af Amer >60 >60 mL/min   GFR calc Af Amer >60 >60 mL/min    Comment: (NOTE) The eGFR has been calculated using the CKD EPI equation. This calculation has not been validated in all clinical situations. eGFR's persistently <60 mL/min signify possible Chronic Kidney Disease.    Anion gap 7 5 - 15  Protime-INR     Status: None   Collection Time: 03/17/16  5:48 PM  Result Value Ref Range   Prothrombin Time  12.9 11.4 - 15.2 seconds   INR 0.97   APTT     Status: None   Collection Time: 03/17/16  5:48 PM  Result Value Ref Range   aPTT 32 24 - 36 seconds  CBC with Differential/Platelet     Status: Abnormal   Collection Time: 03/17/16  6:00 PM  Result Value Ref Range   WBC 5.7 3.8 - 10.6 K/uL   RBC 3.75 (L) 4.40 - 5.90 MIL/uL   Hemoglobin 13.9 13.0 - 18.0 g/dL   HCT 41.5 40.0 - 52.0 %   MCV 110.6 (H) 80.0 - 100.0 fL   MCH 37.1 (H) 26.0 - 34.0 pg   MCHC 33.6 32.0 - 36.0 g/dL   RDW 14.9 (H) 11.5 - 14.5 %   Platelets 111 (L) 150 - 440 K/uL   Neutrophils Relative % 67 %   Neutro Abs 3.8 1.4 - 6.5 K/uL   Lymphocytes Relative 23 %   Lymphs Abs 1.3 1.0 - 3.6 K/uL   Monocytes Relative 9 %   Monocytes Absolute 0.5 0.2 - 1.0 K/uL   Eosinophils Relative 1 %   Eosinophils Absolute 0.1 0 - 0.7 K/uL   Basophils Relative 0 %   Basophils Absolute 0.0 0 - 0.1 K/uL   Ct Abdomen Pelvis W Contrast  Result Date:  03/18/2016 CLINICAL DATA:  Pt c/o severe mid upper abd pain with nausea and vomiting this past Friday and ER visit. Pt states states pain was severe Sunday as well and has eased up some. Hx of gastric bypass 2 yrs ago. FU [place] from ER. No hx of cancer. NKI EXAM: CT ABDOMEN AND PELVIS WITH CONTRAST TECHNIQUE: Multidetector CT imaging of the abdomen and pelvis was performed using the standard protocol following bolus administration of intravenous contrast. CONTRAST:  100 mL of Isovue 370 intravenous contrast COMPARISON:  07/15/2013 FINDINGS: Lower chest: No acute abnormality. Hepatobiliary: Liver shows morphologic changes of cirrhosis. No liver mass or focal lesion. Gallbladder is distended. Small stones and/or sludge is noted in the gallbladder neck. There is no wall thickening or pericholecystic inflammation. Pancreas: Unremarkable. No pancreatic ductal dilatation or surrounding inflammatory changes. Spleen: Spleen borderline enlarged measuring 12.1 x 7.0 x 0.2 cm, unchanged. No splenic mass or focal lesion. Adrenals/Urinary Tract: Small bilateral low-density renal masses, which are stable, largest from the upper pole the right kidney measure 2.3 cm, all consistent with cysts. No other renal masses, no stones and no hydronephrosis. Normal ureters. Normal bladder. No adrenal masses. Stomach/Bowel: There are changes from previous gastric bypass surgery with formation of a gastrojejunostomy, new since the prior CT. No stomach wall thickening or inflammation. Small bowel is otherwise unremarkable. Normal appearance of the colon. Normal appendix visualized. Vascular/Lymphatic: Minor calcifications along a normal caliber abdominal aorta and iliac arteries consistent with atherosclerosis. No adenopathy. Reproductive: Prostate is unremarkable. Other: No ascites.  No hernia. Musculoskeletal: Degenerative changes noted throughout the visualized spine. Bones are demineralized. No osteoblastic or osteolytic lesions. IMPRESSION:  1. No acute findings noted within the abdomen or pelvis. 2. Probable small gallstones in gallbladder sludge in a distended gallbladder, but without CT evidence of acute cholecystitis. 3. Stable changes of cirrhosis. Borderline splenomegaly suggesting a degree of portal venous hypertension. This is also stable. 4. There changes from gastric bypass surgery new since the prior exam. The no evidence of bowel inflammation or bowel obstruction. 5. Stable low-density renal lesions consistent with cysts. 6. Aortic atherosclerosis. Electronically Signed   By: Lajean Manes M.D.   On: 03/18/2016  10:45    Assessment/Plan: Atypical abdominal pain on necessary classic for biliary colic on and morbidly obese male with a history of cirrhosis, thrombocytopenia and splenomegaly suggesting portal hypertension. His comorbidities I will refer him to one of the hepatobiliary surgeons at [facility]. I discussed with him that if there was a surgical complication [facility] for him to be. Extensive counseling provided. He understands. No need for immediate surgical intervention at this time. I spent at least 25 minutes in this encounter with the majority of time spent in counseling and coordination of care.  Caroleen Hamman MD FACS General Surgeon  03/18/2016,1:21 PM

## 2016-03-20 ENCOUNTER — Ambulatory Visit: Payer: Self-pay | Admitting: Surgery

## 2016-09-17 ENCOUNTER — Telehealth: Payer: Self-pay | Admitting: Gastroenterology

## 2016-09-17 NOTE — Telephone Encounter (Signed)
Left voice message with patient to call and schedule appointment with GI for rectal bleeding.

## 2016-10-05 ENCOUNTER — Emergency Department: Payer: BLUE CROSS/BLUE SHIELD

## 2016-10-05 ENCOUNTER — Emergency Department
Admission: EM | Admit: 2016-10-05 | Discharge: 2016-10-05 | Disposition: A | Discharge status: Home / Self Care | Payer: BLUE CROSS/BLUE SHIELD | Attending: Emergency Medicine | Admitting: Emergency Medicine

## 2016-10-05 DIAGNOSIS — Z79899 Other long term (current) drug therapy: Secondary | ICD-10-CM | POA: Diagnosis not present

## 2016-10-05 DIAGNOSIS — E039 Hypothyroidism, unspecified: Secondary | ICD-10-CM | POA: Insufficient documentation

## 2016-10-05 DIAGNOSIS — I10 Essential (primary) hypertension: Secondary | ICD-10-CM | POA: Diagnosis not present

## 2016-10-05 DIAGNOSIS — K8500 Idiopathic acute pancreatitis without necrosis or infection: Secondary | ICD-10-CM | POA: Diagnosis not present

## 2016-10-05 DIAGNOSIS — I2510 Atherosclerotic heart disease of native coronary artery without angina pectoris: Secondary | ICD-10-CM | POA: Diagnosis not present

## 2016-10-05 DIAGNOSIS — R109 Unspecified abdominal pain: Secondary | ICD-10-CM | POA: Diagnosis present

## 2016-10-05 LAB — COMPREHENSIVE METABOLIC PANEL
ALT: 24 U/L (ref 17–63)
AST: 34 U/L (ref 15–41)
Albumin: 4.5 g/dL (ref 3.5–5.0)
Alkaline Phosphatase: 70 U/L (ref 38–126)
Anion gap: 6 (ref 5–15)
BUN: 20 mg/dL (ref 6–20)
CO2: 27 mmol/L (ref 22–32)
Calcium: 9.4 mg/dL (ref 8.9–10.3)
Chloride: 105 mmol/L (ref 101–111)
Creatinine, Ser: 1.11 mg/dL (ref 0.61–1.24)
GFR calc Af Amer: >60 mL/min (ref >60)
GFR calc non Af Amer: >60 mL/min (ref >60)
Glucose, Bld: 100 mg/dL — ABNORMAL HIGH (ref 65–99)
Potassium: 4.2 mmol/L (ref 3.5–5.1)
Sodium: 138 mmol/L (ref 135–145)
Total Bilirubin: 0.9 mg/dL (ref 0.3–1.2)
Total Protein: 7.1 g/dL (ref 6.5–8.1)

## 2016-10-05 LAB — LIPASE, BLOOD: Lipase: 92 U/L — ABNORMAL HIGH (ref 11–51)

## 2016-10-05 LAB — CBC
HCT: 40.2 % (ref 40.0–52.0)
Hemoglobin: 13.7 g/dL (ref 13.0–18.0)
MCH: 37.8 pg — ABNORMAL HIGH (ref 26.0–34.0)
MCHC: 34.1 g/dL (ref 32.0–36.0)
MCV: 110.8 fL — ABNORMAL HIGH (ref 80.0–100.0)
Platelets: 105 10*3/uL — ABNORMAL LOW (ref 150–440)
RBC: 3.62 MIL/uL — ABNORMAL LOW (ref 4.40–5.90)
RDW: 14.7 % — ABNORMAL HIGH (ref 11.5–14.5)
WBC: 6.1 10*3/uL (ref 3.8–10.6)

## 2016-10-05 LAB — URINALYSIS, COMPLETE (UACMP) WITH MICROSCOPIC
Bacteria, UA: NONE SEEN
Bilirubin Urine: NEGATIVE
Glucose, UA: NEGATIVE mg/dL
Hgb urine dipstick: NEGATIVE
Ketones, ur: NEGATIVE mg/dL
Nitrite: NEGATIVE
Protein, ur: NEGATIVE mg/dL
Specific Gravity, Urine: 1.012 (ref 1.005–1.030)
Squamous Epithelial / LPF: NONE SEEN
pH: 6 (ref 5.0–8.0)

## 2016-10-05 MED ORDER — IOPAMIDOL (ISOVUE-300) INJECTION 61%
100.0000 mL | Freq: Once | INTRAVENOUS | Status: AC | PRN
  Administered 2016-10-05: 100 mL via INTRAVENOUS

## 2016-10-05 MED ORDER — SODIUM CHLORIDE 0.9 % IV BOLUS (SEPSIS)
1000.0000 mL | Freq: Once | INTRAVENOUS | Status: AC
  Administered 2016-10-05: 1000 mL via INTRAVENOUS

## 2016-10-05 MED ORDER — TRAMADOL HCL 50 MG PO TABS: 50.0000 mg | ORAL_TABLET | Freq: Four times a day (QID) | ORAL | 0 refills | Status: DC | PRN

## 2016-10-05 MED ORDER — MORPHINE SULFATE (PF) 4 MG/ML IV SOLN
4.0000 mg | Freq: Once | INTRAVENOUS | Status: AC
  Administered 2016-10-05: 4 mg via INTRAVENOUS
  Filled 2016-10-05: qty 1

## 2016-10-05 MED ORDER — IOPAMIDOL (ISOVUE-300) INJECTION 61%
30.0000 mL | Freq: Once | INTRAVENOUS | Status: AC
  Administered 2016-10-05: 30 mL via ORAL

## 2016-10-05 MED ORDER — OXYCODONE-ACETAMINOPHEN 5-325 MG PO TABS
1.0000 | ORAL_TABLET | Freq: Once | ORAL | Status: AC
  Administered 2016-10-05: 1 via ORAL
  Filled 2016-10-05: qty 1

## 2016-10-05 MED ORDER — ONDANSETRON HCL 4 MG/2ML IJ SOLN
4.0000 mg | Freq: Once | INTRAMUSCULAR | Status: AC
  Administered 2016-10-05: 4 mg via INTRAVENOUS
  Filled 2016-10-05: qty 2

## 2016-10-05 MED ORDER — DICYCLOMINE HCL 10 MG PO CAPS
10.0000 mg | ORAL_CAPSULE | Freq: Once | ORAL | Status: AC
  Administered 2016-10-05: 10 mg via ORAL
  Filled 2016-10-05: qty 1

## 2016-10-05 NOTE — ED Notes (Signed)
Pt states, "I'm feeling better, I'm ready to go home."  MD notified.

## 2016-10-05 NOTE — ED Provider Notes (Signed)
At 10:30 patient feels well and wants to go home. Discussed the patient with Dr.  Ernestine Mcmurray [facility] gastroenterology who feels the patient is okay to go home with some pain medicine. Patient can follow-up with primary care or GI   Nena Polio, MD 10/05/16 1036

## 2016-10-05 NOTE — ED Triage Notes (Signed)
Pt to triage via wheelchair. Pt reports abd pain since around 5 pm. Pt reports pain only and denies n/v/d. Pt does report he has hx of gastric bypass July 2015.Pt denies taking medication at home for pain.

## 2016-10-05 NOTE — ED Notes (Signed)
ED Provider at bedside. 

## 2016-10-05 NOTE — ED Notes (Signed)
Placed in wheelchair. Patient reports having abdominal pain.  Patient reports similar pain several months ago and told having gallbladder issues.

## 2016-10-05 NOTE — Discharge Instructions (Signed)
Use Tylenol 4 times a day or Ultram 4 times a day for pain. Drink clear liquids until about noon today and if she can tolerate the Brat diet: bananas rice applesauce toast. Try that, if that is okay can eat regular food in small amounts frequently if you have trouble with increasing pain with the Brat diet stick to clear liquids for another day or 2 and then try the Brat diet again. Return to the emergency room for increasing pain fever vomiting or diarrhea. Remember clear liquids includes anything you can see through not citrus not dairy but other fruit juices like grape juice or apple juice, flat soda, chicken broth etc. please follow-up with your primary care doctor or she can refer you to gastroenterology. He can also follow-up with Dr. Herbert Seta R who is a gastroenterologist I talked to here in the emergency room.

## 2016-10-05 NOTE — ED Notes (Signed)
CT called to come get patient, oral contrast is done

## 2016-10-05 NOTE — ED Provider Notes (Signed)
[facility] Emergency Department Provider Note   ____________________________________________   I have reviewed the triage vital signs and the nursing notes.   HISTORY  Chief Complaint Abdominal Pain   History limited by: Not Limited   HPI Dylan Ho is a 66 y.o. male who presents to the emergency department today because of concerns for abdominal pain. It started yesterday afternoon shortly after the patient ate. It is located in the center abdomen. He denies any radiation to his chest or to his back. It has grown progressively worse since then. He describes it as a 12 out of 10 in severity scale. Patient has not had any nausea or vomiting with it. Denies any diarrhea or change in bowels recently. Does state that he has a history of gallstones but still has his gallbladder. No fevers.   Past Medical History:  Diagnosis Date  . CAD (coronary artery disease)    No Stents Present- per patient  . History of alcohol abuse Last Drink- 2011  . Hypertension   . Thyroid disease     Patient Active Problem List   Diagnosis Date Noted  . Morbid obesity ([place]) 01/19/2014  . Nondependent alcohol abuse 01/11/2010  . Hypothyroidism 01/11/2010  . Hyperlipidemia 01/11/2010  . Acute and subacute liver necrosis 01/11/2010  . CAD (coronary artery disease), native coronary artery 09/22/2008  . Essential hypertension 08/24/2008    Past Surgical History:  Procedure Laterality Date  . BILATERAL TOTAL SHOULDER ARTHROPLASTY  1990's  . GASTRIC BYPASS  2016   Dr. Darnell Level- [place],   . REPLACEMENT TOTAL KNEE BILATERAL  1990's  . TONSILLECTOMY  2004    Prior to Admission medications   Medication Sig Start Date End Date Taking? Authorizing Provider  levothyroxine (SYNTHROID, LEVOTHROID) 175 MCG tablet Take 175 mcg by mouth daily before breakfast.    Historical Provider, MD  ondansetron (ZOFRAN ODT) 4 MG disintegrating tablet Take 1 tablet (4 mg total) by mouth every 8  (eight) hours as needed for nausea or vomiting. 03/15/16   Joanne Gavel, MD  oxyCODONE (ROXICODONE) 5 MG immediate release tablet Take 1 tablet (5 mg total) by mouth every 6 (six) hours as needed for moderate pain. Do not drive while taking this medication. 03/15/16   Joanne Gavel, MD    Allergies Patient has no known allergies.  Family History  Problem Relation Age of Onset  . Cancer Mother     Breast  . Heart disease Father   . Stroke Father   . AAA (abdominal aortic aneurysm) Father     Social History Social History  Substance Use Topics  . Smoking status: Never Smoker  . Smokeless tobacco: Never Used  . Alcohol use No     Comment: Heavy ETOH in past- Last Drink 2011 per patient    Review of Systems  Constitutional: Negative for fever. Cardiovascular: Negative for chest pain. Respiratory: Negative for shortness of breath. Gastrointestinal: Positive for abdominal pain. Neurological: Negative for headaches, focal weakness or numbness.  10-point ROS otherwise negative.  ____________________________________________   PHYSICAL EXAM:  VITAL SIGNS: ED Triage Vitals  Enc Vitals Group     BP 10/05/16 0206 (!) 141/90     Pulse Rate 10/05/16 0206 79     Resp 10/05/16 0206 18     Temp 10/05/16 0206 98.2 F (36.8 C)     Temp Source 10/05/16 0206 Oral     SpO2 10/05/16 0206 99 %     Weight 10/05/16 0207 235  lb (106.6 kg)     Height 10/05/16 0207 6' (1.829 m)     Head Circumference --      Peak Flow --      Pain Score 10/05/16 0206 10   Constitutional: Alert and oriented. Well appearing and in no distress. Eyes: Conjunctivae are normal. Normal extraocular movements. ENT   Head: Normocephalic and atraumatic.   Nose: No congestion/rhinnorhea.   Mouth/Throat: Mucous membranes are moist.   Neck: No stridor. Hematological/Lymphatic/Immunilogical: No cervical lymphadenopathy. Cardiovascular: Normal rate, regular rhythm.  No murmurs, rubs, or gallops.   Respiratory: Normal respiratory effort without tachypnea nor retractions. Breath sounds are clear and equal bilaterally. No wheezes/rales/rhonchi. Gastrointestinal: Soft and non tender. No rebound. No guarding.  Genitourinary: Deferred Musculoskeletal: Normal range of motion in all extremities. No lower extremity edema. Neurologic:  Normal speech and language. No gross focal neurologic deficits are appreciated.  Skin:  Skin is warm, dry and intact. No rash noted. Psychiatric: Mood and affect are normal. Speech and behavior are normal. Patient exhibits appropriate insight and judgment.  ____________________________________________    LABS (pertinent positives/negatives)  Labs Reviewed  LIPASE, BLOOD - Abnormal; Notable for the following:       Result Value   Lipase 92 (*)    All other components within normal limits  COMPREHENSIVE METABOLIC PANEL - Abnormal; Notable for the following:    Glucose, Bld 100 (*)    All other components within normal limits  CBC - Abnormal; Notable for the following:    RBC 3.62 (*)    MCV 110.8 (*)    MCH 37.8 (*)    RDW 14.7 (*)    Platelets 105 (*)    All other components within normal limits  URINALYSIS, COMPLETE (UACMP) WITH MICROSCOPIC - Abnormal; Notable for the following:    Color, Urine YELLOW (*)    APPearance CLEAR (*)    Leukocytes, UA TRACE (*)    All other components within normal limits     ____________________________________________   EKG  None  ____________________________________________    RADIOLOGY  CT abd/pel IMPRESSION:  1. Increased attenuation in the small bowel mesentery, which is new  since the prior CT. This has an appearance consistent with  mesenteric panniculitis.  2. No other acute abnormalities or changes from the prior study.  3. Cirrhosis with borderline splenomegaly.  4. Small gallstone. No acute cholecystitis.  5. Stable changes from previous gastric bypass surgery.     ____________________________________________   PROCEDURES  Procedures  ____________________________________________   INITIAL IMPRESSION / ASSESSMENT AND PLAN / ED COURSE  Pertinent labs & imaging results that were available during my care of the patient were reviewed by me and considered in my medical decision making (see chart for details).  Patient presented to the emergency department today because of concerns for abdominal pain. Patient's lipase was elevated raising concerns for pancreatitis. Patient states he does have a history of gallstones however continues to have his gallbladder. Will give pain medication and obtain CT scan.  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 At this point I wonder patient's mild lipase elevation is secondary to passage of gallstone. No LFT elevation to suggest obstruction. Will trial patient on medication. Will sign out to oncoming provider.  ____________________________________________   FINAL CLINICAL IMPRESSION(S) / ED DIAGNOSES  Final diagnoses:  Abdominal pain, unspecified abdominal location  Idiopathic acute pancreatitis without infection or necrosis     Note: This dictation was prepared with Dragon dictation. Any transcriptional  errors that result from this process are unintentional     Nance Pear, MD 10/05/16 2329

## 2016-10-22 ENCOUNTER — Other Ambulatory Visit: Payer: Self-pay

## 2016-10-22 ENCOUNTER — Encounter: Payer: Self-pay | Admitting: Gastroenterology

## 2016-10-22 ENCOUNTER — Ambulatory Visit (INDEPENDENT_AMBULATORY_CARE_PROVIDER_SITE_OTHER): Payer: BLUE CROSS/BLUE SHIELD | Admitting: Gastroenterology

## 2016-10-22 VITALS — BP 151/92 | HR 64 | Temp 97.7°F | Ht 72.0 in | Wt 243.0 lb

## 2016-10-22 DIAGNOSIS — K921 Melena: Secondary | ICD-10-CM

## 2016-10-22 DIAGNOSIS — K625 Hemorrhage of anus and rectum: Secondary | ICD-10-CM

## 2016-10-22 DIAGNOSIS — K7030 Alcoholic cirrhosis of liver without ascites: Secondary | ICD-10-CM | POA: Diagnosis not present

## 2016-10-22 DIAGNOSIS — K7290 Hepatic failure, unspecified without coma: Secondary | ICD-10-CM

## 2016-10-23 LAB — AFP TUMOR MARKER: AFP-Tumor Marker: 5.9 ng/mL (ref 0.0–8.3)

## 2016-10-23 NOTE — Progress Notes (Signed)
Gastroenterology Consultation  Referring Provider:     Lavera Guise, MD Primary Care Physician:  Lavera Guise, MD Primary Gastroenterologist:  Dr. Allen Norris     Reason for Consultation:     Rectal bleeding        HPI:   Dylan Ho is a 66 y.o. y/o male referred for consultation & management of Rectal bleeding by Dr. Humphrey Rolls, Timoteo Gaul, MD.  This patient comes in today with a history of rectal bleeding.  The patient also has what he describes as alcoholic cirrhosis.  The patient states that he stopped drinking back when he had this diagnosis.  The patient also had a gastric bypass 2 years ago for morbid obesity.  Prior to that the patient had a upper endoscopy by Dr. Rayann Heman.  There is no report of any esophageal varices at that time.  The patient also reports that he has a history of having a colonoscopy in the past with polyps.  He states that he is due for repeat colonoscopy.  The patient's rectal bleeding is bright red blood and he denies any blood mixed with stools or any maroon stools or passage of clots.   Past Medical History:  Diagnosis Date  . CAD (coronary artery disease)    No Stents Present- per patient  . History of alcohol abuse Last Drink- 2011  . Hypertension   . Thyroid disease     Past Surgical History:  Procedure Laterality Date  . BILATERAL TOTAL SHOULDER ARTHROPLASTY  1990's  . GASTRIC BYPASS  2016   Dr. Darnell Level- [place], [place]  . REPLACEMENT TOTAL KNEE BILATERAL  1990's  . TONSILLECTOMY  2004    Prior to Admission medications   Medication Sig Start Date End Date Taking? Authorizing Provider  amLODipine (NORVASC) 5 MG tablet  09/16/16  Yes Historical Provider, MD  levothyroxine (SYNTHROID, LEVOTHROID) 175 MCG tablet Take 175 mcg by mouth daily before breakfast.   Yes Historical Provider, MD  levothyroxine (SYNTHROID, LEVOTHROID) 75 MCG tablet  09/16/16  Yes Historical Provider, MD  ondansetron (ZOFRAN ODT) 4 MG disintegrating tablet Take 1 tablet (4 mg total) by mouth  every 8 (eight) hours as needed for nausea or vomiting. 03/15/16  Yes Joanne Gavel, MD  oxyCODONE (ROXICODONE) 5 MG immediate release tablet Take 1 tablet (5 mg total) by mouth every 6 (six) hours as needed for moderate pain. Do not drive while taking this medication. 03/15/16  Yes Joanne Gavel, MD  traMADol (ULTRAM) 50 MG tablet Take 1 tablet (50 mg total) by mouth every 6 (six) hours as needed. 10/05/16 10/05/17 Yes Nena Polio, MD    Family History  Problem Relation Age of Onset  . Cancer Mother     Breast  . Heart disease Father   . Stroke Father   . AAA (abdominal aortic aneurysm) Father      Social History  Substance Use Topics  . Smoking status: Never Smoker  . Smokeless tobacco: Never Used  . Alcohol use No     Comment: Heavy ETOH in past- Last Drink 2011 per patient    Allergies as of 10/22/2016  . (No Known Allergies)    Review of Systems:    All systems reviewed and negative except where noted in HPI.   Physical Exam:  BP (!) 151/92   Pulse 64   Temp 97.7 F (36.5 C) (Oral)   Ht 6' (1.829 m)   Wt 243 lb (110.2 kg)   BMI  32.96 kg/m  No LMP for male patient. Psych:  Alert and cooperative. Normal mood and affect. General:   Alert,  Well-developed, well-nourished, pleasant and cooperative in NAD Head:  Normocephalic and atraumatic. Eyes:  Sclera clear, no icterus.   Conjunctiva pink. Ears:  Normal auditory acuity. Nose:  No deformity, discharge, or lesions. Mouth:  No deformity or lesions,oropharynx pink & moist. Neck:  Supple; no masses or thyromegaly. Lungs:  Respirations even and unlabored.  Clear throughout to auscultation.   No wheezes, crackles, or rhonchi. No acute distress. Heart:  Regular rate and rhythm; no murmurs, clicks, rubs, or gallops. Abdomen:  Normal bowel sounds.  No bruits.  Soft, non-tender and non-distended without masses, hepatosplenomegaly or hernias noted.  No guarding or rebound tenderness.  Negative Carnett sign.   Rectal:  Deferred.    Msk:  Symmetrical without gross deformities.  Good, equal movement & strength bilaterally. Pulses:  Normal pulses noted. Extremities:  No clubbing or edema.  No cyanosis. Neurologic:  Alert and oriented x3;  grossly normal neurologically. Skin:  Intact without significant lesions or rashes.  No jaundice. Lymph Nodes:  No significant cervical adenopathy. Psych:  Alert and cooperative. Normal mood and affect.  Imaging Studies: Ct Abdomen Pelvis W Contrast  Result Date: 10/05/2016 CLINICAL DATA:  Pt states pain in his lower abd/pelvis that started yesterday afternoon. Pt denies N/V/D. Pt with hx of gastric bypass. EXAM: CT ABDOMEN AND PELVIS WITH CONTRAST TECHNIQUE: Multidetector CT imaging of the abdomen and pelvis was performed using the standard protocol following bolus administration of intravenous contrast. CONTRAST:  113mL ISOVUE-300 IOPAMIDOL (ISOVUE-300) INJECTION 61% COMPARISON:  03/18/2016 FINDINGS: Lower chest: Clear lung bases.  Heart normal size. Hepatobiliary: Liver has morphologic changes consistent with cirrhosis with central volume loss and nodularity. No discrete mass or focal lesion. Gallbladder is distended. There is small gallstone dependently. No gallbladder wall thickening or adjacent inflammation. No bile duct dilation. Pancreas: Mild pancreatic atrophy. No pancreatic mass or inflammation. No duct dilation. Spleen: Prominent measuring 12 cm in greatest dimension. No splenic mass or focal lesion. Adrenals/Urinary Tract: No adrenal masses. Bilateral low-attenuation renal masses are noted, largest in the right kidney at the midpole measuring 2.1 cm. These are consistent with cysts. No renal stones. No hydronephrosis. Normal ureters. Normal bladder. Stomach/Bowel: There is hazy opacity throughout the mid and lower abdomen mesenteric, new since the prior study. No mesenteric mass. There are stable changes from the gastric bypass surgery. Stomach is decompressed. Small bowel is normal in  caliber. There is no small bowel wall thickening. There is no colonic wall thickening or inflammation. Normal appendix is visualized. Vascular/Lymphatic: Minor aortic and iliac artery vascular calcifications. No adenopathy. Reproductive: Unremarkable. Other: No abdominal wall hernia.  No ascites. Musculoskeletal: No fracture or acute finding. No osteoblastic or osteolytic lesions. IMPRESSION: 1. Increased attenuation in the small bowel mesentery, which is new since the prior CT. This has an appearance consistent with mesenteric panniculitis. 2. No other acute abnormalities or changes from the prior study. 3. Cirrhosis with borderline splenomegaly. 4. Small gallstone.  No acute cholecystitis. 5. Stable changes from previous gastric bypass surgery. Electronically Signed   By: Lajean Manes M.D.   On: 10/05/2016 08:03    Assessment and Plan:   DAVIT VASSAR is a 66 y.o. y/o male who comes in today with a history of cirrhosis and polyps and a previous colonoscopy. The patient has been stable with regards to his cirrhosis.  The patient had a recent CT scan for  pain in his lower pelvis that did not show any liver masses. The patient will have his bloods sent off for alpha-fetoprotein. The patient will also be set up for colonoscopy. I have discussed risks & benefits which include, but are not limited to, bleeding, infection, perforation & drug reaction.  The patient agrees with this plan & written consent will be obtained.       Lucilla Lame, MD. Marval Regal   Note: This dictation was prepared with Dragon dictation along with smaller phrase technology. Any transcriptional errors that result from this process are unintentional.

## 2016-10-24 ENCOUNTER — Telehealth: Payer: Self-pay | Admitting: Gastroenterology

## 2016-10-24 ENCOUNTER — Telehealth: Payer: Self-pay

## 2016-10-24 NOTE — Telephone Encounter (Signed)
10/24/16 Spoke with Lucita Ferrara at [facility] and NO prior Josem Kaufmann is required for Colonoscopy [phone] / K62.5

## 2016-10-24 NOTE — Telephone Encounter (Signed)
-----   Message from Lucilla Lame, MD sent at 10/23/2016  7:15 PM EDT ----- The patient noted a tumor marker was negative

## 2016-10-27 NOTE — Telephone Encounter (Signed)
Left pt a message on his voice mail informing him  tumor marker was negative.

## 2016-10-29 ENCOUNTER — Inpatient Hospital Stay
Admission: EM | Admit: 2016-10-29 | Discharge: 2016-10-31 | DRG: 377 | Disposition: A | Discharge status: Home / Self Care | Payer: BLUE CROSS/BLUE SHIELD | Attending: Internal Medicine | Admitting: Internal Medicine

## 2016-10-29 ENCOUNTER — Inpatient Hospital Stay: Payer: BLUE CROSS/BLUE SHIELD

## 2016-10-29 ENCOUNTER — Encounter: Payer: Self-pay | Admitting: Emergency Medicine

## 2016-10-29 DIAGNOSIS — E876 Hypokalemia: Secondary | ICD-10-CM | POA: Diagnosis present

## 2016-10-29 DIAGNOSIS — E039 Hypothyroidism, unspecified: Secondary | ICD-10-CM | POA: Diagnosis present

## 2016-10-29 DIAGNOSIS — K254 Chronic or unspecified gastric ulcer with hemorrhage: Principal | ICD-10-CM | POA: Diagnosis present

## 2016-10-29 DIAGNOSIS — K641 Second degree hemorrhoids: Secondary | ICD-10-CM | POA: Diagnosis present

## 2016-10-29 DIAGNOSIS — I2510 Atherosclerotic heart disease of native coronary artery without angina pectoris: Secondary | ICD-10-CM | POA: Diagnosis present

## 2016-10-29 DIAGNOSIS — I10 Essential (primary) hypertension: Secondary | ICD-10-CM | POA: Diagnosis present

## 2016-10-29 DIAGNOSIS — Z6831 Body mass index (BMI) 31.0-31.9, adult: Secondary | ICD-10-CM | POA: Diagnosis not present

## 2016-10-29 DIAGNOSIS — E669 Obesity, unspecified: Secondary | ICD-10-CM | POA: Diagnosis present

## 2016-10-29 DIAGNOSIS — R578 Other shock: Secondary | ICD-10-CM | POA: Diagnosis present

## 2016-10-29 DIAGNOSIS — K5730 Diverticulosis of large intestine without perforation or abscess without bleeding: Secondary | ICD-10-CM | POA: Diagnosis present

## 2016-10-29 DIAGNOSIS — D709 Neutropenia, unspecified: Secondary | ICD-10-CM | POA: Diagnosis present

## 2016-10-29 DIAGNOSIS — K920 Hematemesis: Secondary | ICD-10-CM | POA: Diagnosis not present

## 2016-10-29 DIAGNOSIS — Z96653 Presence of artificial knee joint, bilateral: Secondary | ICD-10-CM | POA: Diagnosis present

## 2016-10-29 DIAGNOSIS — Z79899 Other long term (current) drug therapy: Secondary | ICD-10-CM | POA: Diagnosis not present

## 2016-10-29 DIAGNOSIS — K922 Gastrointestinal hemorrhage, unspecified: Secondary | ICD-10-CM | POA: Diagnosis not present

## 2016-10-29 DIAGNOSIS — Z86010 Personal history of colonic polyps: Secondary | ICD-10-CM

## 2016-10-29 DIAGNOSIS — K289 Gastrojejunal ulcer, unspecified as acute or chronic, without hemorrhage or perforation: Secondary | ICD-10-CM

## 2016-10-29 DIAGNOSIS — I959 Hypotension, unspecified: Secondary | ICD-10-CM | POA: Diagnosis present

## 2016-10-29 DIAGNOSIS — M25562 Pain in left knee: Secondary | ICD-10-CM

## 2016-10-29 DIAGNOSIS — D62 Acute posthemorrhagic anemia: Secondary | ICD-10-CM | POA: Diagnosis present

## 2016-10-29 DIAGNOSIS — K921 Melena: Secondary | ICD-10-CM | POA: Diagnosis not present

## 2016-10-29 DIAGNOSIS — K7030 Alcoholic cirrhosis of liver without ascites: Secondary | ICD-10-CM | POA: Diagnosis present

## 2016-10-29 DIAGNOSIS — Z8719 Personal history of other diseases of the digestive system: Secondary | ICD-10-CM

## 2016-10-29 DIAGNOSIS — M25462 Effusion, left knee: Secondary | ICD-10-CM

## 2016-10-29 DIAGNOSIS — Z9884 Bariatric surgery status: Secondary | ICD-10-CM | POA: Diagnosis not present

## 2016-10-29 DIAGNOSIS — G4730 Sleep apnea, unspecified: Secondary | ICD-10-CM | POA: Diagnosis present

## 2016-10-29 DIAGNOSIS — D539 Nutritional anemia, unspecified: Secondary | ICD-10-CM | POA: Diagnosis not present

## 2016-10-29 DIAGNOSIS — D6959 Other secondary thrombocytopenia: Secondary | ICD-10-CM | POA: Diagnosis present

## 2016-10-29 HISTORY — DX: Polyp of colon: K63.5

## 2016-10-29 HISTORY — DX: Sleep apnea, unspecified: G47.30

## 2016-10-29 LAB — CBC WITH DIFFERENTIAL/PLATELET
Basophils Absolute: 0 10*3/uL (ref 0–0.1)
Basophils Relative: 0 %
Eosinophils Absolute: 0 10*3/uL (ref 0–0.7)
Eosinophils Relative: 1 %
HCT: 25.1 % — ABNORMAL LOW (ref 40.0–52.0)
Hemoglobin: 8.3 g/dL — ABNORMAL LOW (ref 13.0–18.0)
Lymphocytes Relative: 21 %
Lymphs Abs: 0.7 10*3/uL — ABNORMAL LOW (ref 1.0–3.6)
MCH: 36.7 pg — ABNORMAL HIGH (ref 26.0–34.0)
MCHC: 33.3 g/dL (ref 32.0–36.0)
MCV: 110.4 fL — ABNORMAL HIGH (ref 80.0–100.0)
Monocytes Absolute: 0.2 10*3/uL (ref 0.2–1.0)
Monocytes Relative: 4 %
Neutro Abs: 2.6 10*3/uL (ref 1.4–6.5)
Neutrophils Relative %: 74 %
Platelets: 95 10*3/uL — ABNORMAL LOW (ref 150–440)
RBC: 2.27 MIL/uL — ABNORMAL LOW (ref 4.40–5.90)
RDW: 14.2 % (ref 11.5–14.5)
WBC: 3.5 10*3/uL — ABNORMAL LOW (ref 3.8–10.6)

## 2016-10-29 LAB — COMPREHENSIVE METABOLIC PANEL
ALT: 15 U/L — ABNORMAL LOW (ref 17–63)
AST: 28 U/L (ref 15–41)
Albumin: 3.2 g/dL — ABNORMAL LOW (ref 3.5–5.0)
Alkaline Phosphatase: 44 U/L (ref 38–126)
Anion gap: 11 (ref 5–15)
BUN: 52 mg/dL — ABNORMAL HIGH (ref 6–20)
CO2: 19 mmol/L — ABNORMAL LOW (ref 22–32)
Calcium: 8.2 mg/dL — ABNORMAL LOW (ref 8.9–10.3)
Chloride: 110 mmol/L (ref 101–111)
Creatinine, Ser: 1.23 mg/dL (ref 0.61–1.24)
GFR calc Af Amer: >60 mL/min (ref >60)
GFR calc non Af Amer: >60 mL/min (ref >60)
Glucose, Bld: 161 mg/dL — ABNORMAL HIGH (ref 65–99)
Potassium: 3.2 mmol/L — ABNORMAL LOW (ref 3.5–5.1)
Sodium: 140 mmol/L (ref 135–145)
Total Bilirubin: 0.7 mg/dL (ref 0.3–1.2)
Total Protein: 5.1 g/dL — ABNORMAL LOW (ref 6.5–8.1)

## 2016-10-29 LAB — MAGNESIUM: Magnesium: 1.9 mg/dL (ref 1.7–2.4)

## 2016-10-29 LAB — HEMOGLOBIN: Hemoglobin: 10.9 g/dL — ABNORMAL LOW (ref 13.0–18.0)

## 2016-10-29 LAB — TROPONIN I: Troponin I: <0.03 ng/mL (ref <0.03)

## 2016-10-29 LAB — GLUCOSE, CAPILLARY: Glucose-Capillary: 130 mg/dL — ABNORMAL HIGH (ref 65–99)

## 2016-10-29 LAB — APTT: aPTT: 24 seconds (ref 24–36)

## 2016-10-29 LAB — MRSA PCR SCREENING: MRSA by PCR: NEGATIVE

## 2016-10-29 LAB — PREPARE RBC (CROSSMATCH)

## 2016-10-29 LAB — PROTIME-INR
INR: 1.33
Prothrombin Time: 16.6 seconds — ABNORMAL HIGH (ref 11.4–15.2)

## 2016-10-29 MED ORDER — ONDANSETRON HCL 4 MG/2ML IJ SOLN: 4.0000 mg | Freq: Four times a day (QID) | INTRAMUSCULAR | Status: DC | PRN

## 2016-10-29 MED ORDER — MORPHINE SULFATE (PF) 4 MG/ML IV SOLN
1.0000 mg | INTRAVENOUS | Status: DC | PRN
  Administered 2016-10-30: 1 mg via INTRAVENOUS
  Filled 2016-10-29: qty 1

## 2016-10-29 MED ORDER — POTASSIUM CHLORIDE CRYS ER 20 MEQ PO TBCR
20.0000 meq | EXTENDED_RELEASE_TABLET | Freq: Two times a day (BID) | ORAL | Status: AC
Start: 2016-10-29 — End: 2016-10-31
  Administered 2016-10-29 – 2016-10-31 (×4): 20 meq via ORAL
  Filled 2016-10-29 (×4): qty 1

## 2016-10-29 MED ORDER — ONDANSETRON HCL 4 MG PO TABS: 4.0000 mg | ORAL_TABLET | Freq: Four times a day (QID) | ORAL | Status: DC | PRN

## 2016-10-29 MED ORDER — SODIUM CHLORIDE 0.9% FLUSH
3.0000 mL | Freq: Two times a day (BID) | INTRAVENOUS | Status: DC
  Administered 2016-10-29 – 2016-10-31 (×3): 3 mL via INTRAVENOUS

## 2016-10-29 MED ORDER — SODIUM CHLORIDE 0.9 % IV SOLN
50.0000 ug/h | INTRAVENOUS | Status: DC
  Administered 2016-10-29 (×2): 50 ug/h via INTRAVENOUS
  Filled 2016-10-29 (×6): qty 1

## 2016-10-29 MED ORDER — POLYETHYLENE GLYCOL 3350 17 GM/SCOOP PO POWD
1.0000 | ORAL | Status: AC
  Administered 2016-10-29: 255 g via ORAL
  Filled 2016-10-29: qty 255

## 2016-10-29 MED ORDER — PANTOPRAZOLE SODIUM 40 MG IV SOLR
40.0000 mg | Freq: Once | INTRAVENOUS | Status: AC
  Administered 2016-10-29: 40 mg via INTRAVENOUS
  Filled 2016-10-29: qty 40

## 2016-10-29 MED ORDER — SODIUM CHLORIDE 0.9 % IV BOLUS (SEPSIS)
1000.0000 mL | Freq: Once | INTRAVENOUS | Status: AC
  Administered 2016-10-29: 1000 mL via INTRAVENOUS

## 2016-10-29 MED ORDER — PANTOPRAZOLE SODIUM 40 MG IV SOLR: 40.0000 mg | Freq: Two times a day (BID) | INTRAVENOUS | Status: DC

## 2016-10-29 MED ORDER — SODIUM CHLORIDE 0.9 % IV SOLN: 10.0000 mL/h | Freq: Once | INTRAVENOUS | Status: DC

## 2016-10-29 MED ORDER — PANTOPRAZOLE SODIUM 40 MG IV SOLR
8.0000 mg/h | INTRAVENOUS | Status: DC
  Administered 2016-10-29 – 2016-10-30 (×2): 8 mg/h via INTRAVENOUS
  Filled 2016-10-29 (×3): qty 80

## 2016-10-29 MED ORDER — SODIUM CHLORIDE 0.9 % IV SOLN
INTRAVENOUS | Status: DC
  Administered 2016-10-29 – 2016-10-30 (×2): via INTRAVENOUS

## 2016-10-29 MED ORDER — OCTREOTIDE LOAD VIA INFUSION
50.0000 ug | Freq: Once | INTRAVENOUS | Status: AC
  Administered 2016-10-29: 50 ug via INTRAVENOUS
  Filled 2016-10-29: qty 25

## 2016-10-29 MED ORDER — ACETAMINOPHEN 650 MG RE SUPP: 650.0000 mg | Freq: Four times a day (QID) | RECTAL | Status: DC | PRN

## 2016-10-29 MED ORDER — ACETAMINOPHEN 325 MG PO TABS
650.0000 mg | ORAL_TABLET | Freq: Four times a day (QID) | ORAL | Status: DC | PRN
  Administered 2016-10-31: 650 mg via ORAL
  Filled 2016-10-29: qty 2

## 2016-10-29 NOTE — Consult Note (Signed)
Name: Dylan Ho MRN: [id number] DOB: 03/04/1951    ADMISSION DATE:  10/29/2016 CONSULTATION DATE: 10/29/2016  REFERRING MD :  Dr. Verdell Carmine for Gi bleeding.   CHIEF COMPLAINT:  Rectal bleeding   BRIEF PATIENT DESCRIPTION:  66 yo male admitted 04/25 with rectal bleeding and hematemesis   SIGNIFICANT EVENTS  04/25-Pt admitted to [facility] Unit with gastrointestinal bleeding PCCM consulted for additional management   STUDIES:  None  HISTORY OF PRESENT ILLNESS:   This is a 66 yo male with a PMH of Hypothyroidism, HTN, Alcoholic Cirrhosis, ETOH Abuse (quit 2011), Colon Polyps, Gastric Bypass for Morbid Obesity (2016), and CAD.  He presented to [facility] ER 04/25 via EMS with c/o rectal bleeding and vomiting blood. According to ER notes per EMS the pt has had dark red rectal bleeding intermittently over the past 3 months, but denies hematemesis. He had an initial outpatient gastroenterology consultation with Dr. Allen Norris on 10/22/16 plans were made for a colonoscopy scheduled for 11/07/16 and blood samples were sent for alpha-fetoprotein.  According to the pt he was at work yesterday 04/24 and during ambulation he began to feel dizzy and short of breath.  When he got home from work this morning 04/25 he began to have 3 episodes of bright red hematemesis and a syncopal episode following a loose stool. He is unsure if blood was present in the stool, however when he wiped there was dark blood present prompting current ER visit. He denies taking anticoagulants, tylenol, vitamins or herbal remedies, however he does endorse taking ibuprofen occasionally for pain.   In the ER lab results revealed hgb 8.3, platelet count 95, prothrombin time 16.6, inr 1.33, APTT 24, and vss.  Therefore, orders placed to transfuse 2 units pRBC's and pt [facility] with gastrointestinal bleeding by hospitalist for further work-up and management.    PAST MEDICAL HISTORY :   has a past medical history  of CAD (coronary artery disease); Colon polyps; History of alcohol abuse (Last Drink- 2011); Hypertension; and Thyroid disease.  has a past surgical history that includes Gastric bypass (2016); Tonsillectomy (2004); Replacement total knee bilateral (1990's); and Bilateral total shoulder arthroplasty (1990's). Prior to Admission medications   Medication Sig Start Date End Date Taking? Authorizing Provider  amLODipine (NORVASC) 5 MG tablet Take 5 mg by mouth daily.  09/16/16  Yes Historical Provider, MD  levothyroxine (SYNTHROID, LEVOTHROID) 75 MCG tablet Take 75 mcg by mouth daily before breakfast.  09/16/16  Yes Historical Provider, MD  traMADol (ULTRAM) 50 MG tablet Take 1 tablet (50 mg total) by mouth every 6 (six) hours as needed. 10/05/16 10/05/17 Yes Nena Polio, MD  ondansetron (ZOFRAN ODT) 4 MG disintegrating tablet Take 1 tablet (4 mg total) by mouth every 8 (eight) hours as needed for nausea or vomiting. Patient not taking: Reported on 10/29/2016 03/15/16   Joanne Gavel, MD  oxyCODONE (ROXICODONE) 5 MG immediate release tablet Take 1 tablet (5 mg total) by mouth every 6 (six) hours as needed for moderate pain. Do not drive while taking this medication. Patient not taking: Reported on 10/29/2016 03/15/16   Joanne Gavel, MD   No Known Allergies  FAMILY HISTORY:  family history includes AAA (abdominal aortic aneurysm) in his father; Cancer in his mother; Heart disease in his father; Stroke in his father. SOCIAL HISTORY:  reports that he has never smoked. He has never used smokeless tobacco. He reports that he does not drink alcohol or use drugs.  REVIEW OF SYSTEMS:  Positives in BOLD Constitutional: Negative for fever, chills, weight loss, malaise/fatigue and diaphoresis.  HENT: Negative for hearing loss, ear pain, nosebleeds, congestion, sore throat, neck pain, tinnitus and ear discharge.   Eyes: Negative for blurred vision, double vision, photophobia, pain, discharge and redness.    Respiratory: cough, hemoptysis, sputum production, shortness of breath, wheezing and stridor.   Cardiovascular: Negative for chest pain, palpitations, orthopnea, claudication, leg swelling and PND.  Gastrointestinal: heartburn, nausea, vomiting, abdominal pain, diarrhea, constipation, hematemesis, blood in stool and melena.  Genitourinary: Negative for dysuria, urgency, frequency, hematuria and flank pain.  Musculoskeletal: myalgias, back pain, left knee joint pain and falls.  Skin: Negative for itching and rash.  Neurological: dizziness, syncopal episode, tingling, tremors, sensory change, speech change, focal weakness, seizures, loss of consciousness, weakness and headaches.  Endo/Heme/Allergies: Negative for environmental allergies and polydipsia. Does not bruise/bleed easily.  SUBJECTIVE:  Pts only complaint is left knee swelling and pain.   VITAL SIGNS: Temp:  [97.6 F (36.4 C)-97.9 F (36.6 C)] 97.8 F (36.6 C) (04/25 1403) Pulse Rate:  [78-84] 84 (04/25 1403) Resp:  [18-21] 20 (04/25 1403) BP: (97-125)/(62-78) 125/70 (04/25 1403) SpO2:  [97 %-100 %] 100 % (04/25 1403) Weight:  [111.1 kg (245 lb)] 111.1 kg (245 lb) (04/25 1117)  PHYSICAL EXAMINATION: General: well developed, well nourished obese Caucasian male Neuro: alert and oriented, follows commands HEENT: large neck, no JVD Cardiovascular: nsr, s1s2, rrr, no M/R/G Lungs: clear throughout, even, non labored  Abdomen: +BS x4, soft, obese, non tender, non distended  Musculoskeletal: normal bulk and tone Skin: intact no rashes or lesions    Recent Labs Lab 10/29/16 1127  NA 140  K 3.2*  CL 110  CO2 19*  BUN 52*  CREATININE 1.23  GLUCOSE 161*    Recent Labs Lab 10/29/16 1127  HGB 8.3*  HCT 25.1*  WBC 3.5*  PLT 95*   No results found.  ASSESSMENT / PLAN: Gastrointestinal Bleeding Acute Blood Loss Anemia Thrombocytopenia  Neutropenia  Hx: ETOH Abuse, Alcoholic Cirrhosis, Colon Polyps, and Gastric  Bypass P: Supplemental O2 to maintain O2 sats >92% or for dyspnea Transfuse for hgb <7 or for active s/sx of bleeding Serial hgb's q6hrs Monitor for s/sx of bleeding Avoid chemical VTE prophylaxis, SCD's for VTE prophylaxis  NS @ 119ml/hr  Fluid resuscitation prn to maintain map >65 Gastroenterology consulted appreciate input Octeotide and Protonix gtt If WBC becomes <2 will place pt on neutropenic precautions Replace electrolytes as indicated  Prn zofran for nausea and vomiting   Marda Stalker, [place] Pager [phone] (please enter 7 digits) PCCM Consult Pager [phone] (please enter 7 digits)  Patient seen and examined with NP, above note as amended reflects my findings, assessment, plan. 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 Patient received 2 units of packed red cells overnight, hemoglobin is 10, baseline hemoglobin previously was 13.7, on admission his hemoglobin was 8.3. His also noted to have significant macrocytic anemia with an MCV of 110. The case has been discussed with gastroenterology, scope is being planned for later this morning.  -Marda Stalker, M.D.  10/30/2016

## 2016-10-29 NOTE — Progress Notes (Signed)
[place] Progress Note Patient Name: Dylan Ho DOB: [birth year] MRN: [id number]   Date of Service  10/29/2016  HPI/Events of Note  Admitted with GI bleed, history of alcohol cirrhosis  Hemoglobin 8.3  Stable on cam check   eICU Interventions  GI consulted On octreotide, Protonix monitor CBC     Intervention Category Evaluation Type: New Patient Evaluation  Chaka Boyson 10/29/2016, 4:57 PM

## 2016-10-29 NOTE — ED Notes (Signed)
Hooked pt up to monitor and gave pt a warm blanket.

## 2016-10-29 NOTE — H&P (Signed)
[facility] NAME: Dylan Ho    MR#:  [id number]  DATE OF BIRTH:  1950/10/08  DATE OF ADMISSION:  10/29/2016  PRIMARY CARE PHYSICIAN: Dylan Guise, MD   REQUESTING/REFERRING PHYSICIAN: Dr. Brenton Ho  CHIEF COMPLAINT:   Chief Complaint  Patient presents with  . Rectal Bleeding    HISTORY OF PRESENT ILLNESS:  Dylan Ho  is a 66 y.o. 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 Patient says that he was working yesterday and when walking around and getting up at work he felt dizzy and short of breath. He came home this morning and had 3 episodes of hematemesis and also had one episode of loose stools and when he wiped himself it was bloody. He therefore came to the ER for further evaluation. Patient has been having intermittent rectal bleeding now for the past few months and is supposed to see gastroenterology next week for an outpatient colonoscopy but given his acute symptoms presently he presented to the ER for further evaluation and hospitalist services were contacted further treatment and evaluation. Patient denies any chest pain, abdominal pain, fever or chills or any other associated symptoms presently.  PAST MEDICAL HISTORY:   Past Medical History:  Diagnosis Date  . CAD (coronary artery disease)    No Stents Present- per patient  . Colon polyps   . History of alcohol abuse Last Drink- 2011  . Hypertension   . Thyroid disease     PAST SURGICAL HISTORY:   Past Surgical History:  Procedure Laterality Date  . BILATERAL TOTAL SHOULDER ARTHROPLASTY  1990's  . GASTRIC BYPASS  2016   Dr. Darnell Level- [place], [place]  . REPLACEMENT TOTAL KNEE BILATERAL  1990's  . TONSILLECTOMY  2004    SOCIAL HISTORY:   Social History  Substance Use Topics   . Smoking status: Never Smoker  . Smokeless tobacco: Never Used  . Alcohol use No     Comment: Heavy ETOH in past- Last Drink 2011 per patient    FAMILY HISTORY:   Family History  Problem Relation Age of Onset  . Cancer Mother     Breast  . Heart disease Father   . Stroke Father   . AAA (abdominal aortic aneurysm) Father     DRUG ALLERGIES:  No Known Allergies  REVIEW OF SYSTEMS:   Review of Systems  Constitutional: Negative for fever and weight loss.  HENT: Negative for congestion, nosebleeds and tinnitus.   Eyes: Negative for blurred vision, double vision and redness.  Respiratory: Negative for cough, hemoptysis and shortness of breath.   Cardiovascular: Negative for chest pain, orthopnea, leg swelling and PND.  Gastrointestinal: Positive for blood in stool and vomiting. Negative for abdominal pain, diarrhea and melena.       Hematemesis.   Genitourinary: Negative for dysuria, hematuria and urgency.  Musculoskeletal: Negative for falls and joint pain.  Neurological: Negative for dizziness, tingling, sensory change, focal weakness, seizures, weakness and headaches.  Endo/Heme/Allergies: Negative for polydipsia. Does not bruise/bleed easily.  Psychiatric/Behavioral: Negative for depression and memory loss. The patient is not nervous/anxious.     MEDICATIONS AT HOME:   Prior to Admission medications   Medication Sig Start Date End Date Taking? Authorizing Provider  amLODipine (NORVASC) 5 MG tablet Take 5 mg by mouth daily.  09/16/16  Yes Historical Provider, MD  levothyroxine (SYNTHROID, LEVOTHROID) 75 MCG tablet Take 75 mcg by mouth daily before breakfast.  09/16/16  Yes Historical Provider, MD  traMADol (ULTRAM) 50 MG tablet Take 1 tablet (50 mg total) by mouth every 6 (six) hours as needed. 10/05/16 10/05/17 Yes Nena Polio, MD  ondansetron (ZOFRAN ODT) 4 MG disintegrating tablet Take 1 tablet (4 mg total) by mouth every 8 (eight) hours as needed for nausea or  vomiting. Patient not taking: Reported on 10/29/2016 03/15/16   Joanne Gavel, MD  oxyCODONE (ROXICODONE) 5 MG immediate release tablet Take 1 tablet (5 mg total) by mouth every 6 (six) hours as needed for moderate pain. Do not drive while taking this medication. Patient not taking: Reported on 10/29/2016 03/15/16   Joanne Gavel, MD      VITAL SIGNS:  Blood pressure 125/70, pulse 84, temperature 97.8 F (36.6 C), temperature source Oral, resp. rate 20, height 6' (1.829 m), weight 111.1 kg (245 lb), SpO2 100 %.  PHYSICAL EXAMINATION:  Physical Exam  GENERAL:  66 y.o.-year-old obese pale appearing patient lying in bed in no acute distress.  EYES: Pupils equal, round, reactive to light and accommodation. No scleral icterus. Extraocular muscles intact.  HEENT: Head atraumatic, normocephalic. Oropharynx and nasopharynx clear. No oropharyngeal erythema, moist oral mucosa. Poor Dentition.  NECK:  Supple, no jugular venous distention. No thyroid enlargement, no tenderness.  LUNGS: Normal breath sounds bilaterally, no wheezing, rales, rhonchi. No use of accessory muscles of respiration.  CARDIOVASCULAR: S1, S2 RRR. No murmurs, rubs, gallops, clicks.  ABDOMEN: Soft, nontender, nondistended. Bowel sounds present. No organomegaly or mass.  EXTREMITIES: No pedal edema, cyanosis, or clubbing. + 2 pedal & radial pulses b/l.   NEUROLOGIC: Cranial nerves II through XII are intact. No focal Motor or sensory deficits appreciated b/l PSYCHIATRIC: The patient is alert and oriented x 3.  SKIN: No obvious rash, lesion, or ulcer.   LABORATORY PANEL:   CBC  Recent Labs Lab 10/29/16 1127  WBC 3.5*  HGB 8.3*  HCT 25.1*  PLT 95*   ------------------------------------------------------------------------------------------------------------------  Chemistries   Recent Labs Lab 10/29/16 1127  NA 140  K 3.2*  CL 110  CO2 19*  GLUCOSE 161*  BUN 52*  CREATININE 1.23  CALCIUM 8.2*  AST 28  ALT 15*   ALKPHOS 44  BILITOT 0.7   ------------------------------------------------------------------------------------------------------------------  Cardiac Enzymes  Recent Labs Lab 10/29/16 1127  TROPONINI <0.03   ------------------------------------------------------------------------------------------------------------------  RADIOLOGY:  No results found.   IMPRESSION AND PLAN:   66 year old male with past medical history of hypothyroidism, hypertension, history of coronary artery disease, alcoholic liver cirrhosis who presents to the hospital due to hematemesis and also rectal bleeding.  1. GI bleed-patient has symptoms of both an upper and lower GI bleed. Patient has had some intermittent rectal bleeding and also some hematemesis. -He does have a history of liver disease secondary to alcohol. We'll start the patient on octreotide and Protonix drip. -Hemoglobin has dropped and we'll transfuse 2 units of packed red blood cells. Follow serial hemoglobin. -We'll get a gastroenterology consult, patient is to be evaluated by Dr.Wohl tomorrow.   2. Acute blood loss Anemia - due to GI bleed as mentioned above.  - will transfuse 2 units of PRBC's and follow Hg.  - Hg. Was 13.7 in early April and today is 8.3.    3. 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  - will follow counts.   4. Hypokalemia - will  place on oral potassium supplements and repeat level in a.m.  - check mg. Level.   5. Hypothyroidism - cont. Synthroid.   6. HTN - pt. Was hypotensive and will hold anti-HTn for now.     All the records are reviewed and case discussed with ED provider. Management plans discussed with the patient, family and they are in agreement.  CODE STATUS: Full code  TOTAL TIME TAKING CARE OF THIS PATIENT: 45 minutes.    Henreitta Leber M.D on 10/29/2016 at 2:07 PM  Between 7am to 6pm - Pager - [phone]  After 6pm go to [email or web address] - password [email or web address] [facility]  Office  [phone]  CC: Primary care physician; Dylan Guise, MD

## 2016-10-29 NOTE — ED Notes (Signed)
This RN obtained consent for blood transfusion from patient, placed in chart.

## 2016-10-29 NOTE — ED Provider Notes (Signed)
[facility] Emergency Department Provider Note  ____________________________________________  Time seen: Approximately 1:21 PM  I have reviewed the triage vital signs and the nursing notes.   HISTORY  Chief Complaint Rectal Bleeding    HPI Dylan Ho is a 66 y.o. male brought to the ED due to hematemesis hypertension and rectal bleeding.  Patient reports that he's had rectal bleeding for the past 3 months. He seen GI Dr. Allen Norris and is scheduled for a colonoscopy on May 4.However, he is continued to have rectal bleeding, and this morning he vomited blood. He also has a history of nonalcoholic liver cirrhosis with unremarkable LFTs. Denies pain. Does endorse dizziness. No shortness of breath or syncope. No trauma.     Past Medical History:  Diagnosis Date  . CAD (coronary artery disease)    No Stents Present- per patient  . Colon polyps   . History of alcohol abuse Last Drink- 2011  . Hypertension   . Thyroid disease      Patient Active Problem List   Diagnosis Date Noted  . Morbid obesity ([place]) 01/19/2014  . Nondependent alcohol abuse 01/11/2010  . Hypothyroidism 01/11/2010  . Hyperlipidemia 01/11/2010  . Acute and subacute liver necrosis 01/11/2010  . Coronary atherosclerosis of native coronary artery 09/22/2008  . Essential hypertension 08/24/2008     Past Surgical History:  Procedure Laterality Date  . BILATERAL TOTAL SHOULDER ARTHROPLASTY  1990's  . GASTRIC BYPASS  2016   Dr. Darnell Level- [place], [place]  . REPLACEMENT TOTAL KNEE BILATERAL  1990's  . TONSILLECTOMY  2004     Prior to Admission medications   Medication Sig Start Date End Date Taking? Authorizing Provider  traMADol (ULTRAM) 50 MG tablet Take 1 tablet (50 mg total) by mouth every 6 (six) hours as needed. 10/05/16 10/05/17 Yes Nena Polio, MD  amLODipine (NORVASC) 5 MG tablet Take 5 mg by mouth daily.  09/16/16   Historical Provider, MD  levothyroxine (SYNTHROID, LEVOTHROID) 175  MCG tablet Take 175 mcg by mouth daily before breakfast.    Historical Provider, MD  levothyroxine (SYNTHROID, LEVOTHROID) 75 MCG tablet Take 75 mcg by mouth daily before breakfast.  09/16/16   Historical Provider, MD  ondansetron (ZOFRAN ODT) 4 MG disintegrating tablet Take 1 tablet (4 mg total) by mouth every 8 (eight) hours as needed for nausea or vomiting. Patient not taking: Reported on 10/29/2016 03/15/16   Joanne Gavel, MD  oxyCODONE (ROXICODONE) 5 MG immediate release tablet Take 1 tablet (5 mg total) by mouth every 6 (six) hours as needed for moderate pain. Do not drive while taking this medication. Patient not taking: Reported on 10/29/2016 03/15/16   Joanne Gavel, MD     Allergies Patient has no known allergies.   Family History  Problem Relation Age of Onset  . Cancer Mother     Breast  . Heart disease Father   . Stroke Father   . AAA (abdominal aortic aneurysm) Father     Social History Social History  Substance Use Topics  . Smoking status: Never Smoker  . Smokeless tobacco: Never Used  . Alcohol use No     Comment: Heavy ETOH in past- Last Drink 2011 per patient    Review of Systems  Constitutional:   No fever or chills.  ENT:   No sore throat. No rhinorrhea. Lymphatic: No swollen glands, No extremity swelling Endocrine: No hot/cold flashes. No significant weight change. No neck swelling. Cardiovascular:   No chest pain  or syncope. Respiratory:   No dyspnea or cough. Gastrointestinal:   Negative for abdominal pain, Positive hematemesis. Positive rectal bleeding Genitourinary:   Negative for dysuria or difficulty urinating. Musculoskeletal:   Negative for focal pain or swelling Neurological:   Negative for headaches or weakness. All other systems reviewed and are negative except as documented above in ROS and HPI.  ____________________________________________   PHYSICAL EXAM:  VITAL SIGNS: ED Triage Vitals  Enc Vitals Group     BP 10/29/16 1125 97/62      Pulse Rate 10/29/16 1125 78     Resp 10/29/16 1125 (!) 21     Temp 10/29/16 1125 97.6 F (36.4 C)     Temp Source 10/29/16 1125 Oral     SpO2 10/29/16 1115 97 %     Weight 10/29/16 1117 245 lb (111.1 kg)     Height 10/29/16 1117 6' (1.829 m)     Head Circumference --      Peak Flow --      Pain Score --      Pain Loc --      Pain Edu? --      Excl. in [place]? --     Vital signs reviewed, nursing assessments reviewed.   Constitutional:   Alert and oriented. Well appearing and in no distress. Eyes:   No scleral icterus. Positive conjunctival pallor. PERRL. EOMI.  No nystagmus. ENT   Head:   Normocephalic and atraumatic.   Nose:   No congestion/rhinnorhea. No septal hematoma   Mouth/Throat:   MMM, no pharyngeal erythema. No peritonsillar mass.    Neck:   No stridor. No SubQ emphysema. No meningismus. Hematological/Lymphatic/Immunilogical:   No cervical lymphadenopathy. Cardiovascular:   RRR. Symmetric bilateral radial and DP pulses.  No murmurs.  Respiratory:   Normal respiratory effort without tachypnea nor retractions. Breath sounds are clear and equal bilaterally. No wheezes/rales/rhonchi. Gastrointestinal:   Soft and nontender. Non distended. There is no CVA tenderness.  No rebound, rigidity, or guarding. Rectal exam reveals brown stool that is Hemoccult positive. There is also a small amount of fresh red blood on the anus. Genitourinary:   deferred Musculoskeletal:   Normal range of motion in all extremities. No joint effusions.  No lower extremity tenderness.  No edema. Neurologic:   Normal speech and language.  CN 2-10 normal. Motor grossly intact. No gross focal neurologic deficits are appreciated.  Skin:    Skin is warm, dry and intact. No rash noted.  No petechiae, purpura, or bullae.  ____________________________________________    LABS (pertinent positives/negatives) (all labs ordered are listed, but only abnormal results are displayed) Labs Reviewed  CBC  WITH DIFFERENTIAL/PLATELET - Abnormal; Notable for the following:       Result Value   WBC 3.5 (*)    RBC 2.27 (*)    Hemoglobin 8.3 (*)    HCT 25.1 (*)    MCV 110.4 (*)    MCH 36.7 (*)    Platelets 95 (*)    Lymphs Abs 0.7 (*)    All other components within normal limits  COMPREHENSIVE METABOLIC PANEL - Abnormal; Notable for the following:    Potassium 3.2 (*)    CO2 19 (*)    Glucose, Bld 161 (*)    BUN 52 (*)    Calcium 8.2 (*)    Total Protein 5.1 (*)    Albumin 3.2 (*)    ALT 15 (*)    All other components within normal limits  TROPONIN I  TYPE AND SCREEN  PREPARE RBC (CROSSMATCH)  ABO/RH   ____________________________________________   EKG  Interpreted by me Sinus rhythm rate of 78, normal axis and intervals. Normal QRS ST segments and T waves. ____________________________________________    RADIOLOGY  No results found.  ____________________________________________   PROCEDURES Procedures CRITICAL CARE Performed by: Joni Fears, Ayasha Ellingsen   Total critical care time: 35 minutes  Critical care time was exclusive of separately billable procedures and treating other patients.  Critical care was necessary to treat or prevent imminent or life-threatening deterioration.  Critical care was time spent personally by me on the following activities: development of treatment plan with patient and/or surrogate as well as nursing, discussions with consultants, evaluation of patient's response to treatment, examination of patient, obtaining history from patient or surrogate, ordering and performing treatments and interventions, ordering and review of laboratory studies, ordering and review of radiographic studies, pulse oximetry and re-evaluation of patient's condition.  ____________________________________________   INITIAL IMPRESSION / ASSESSMENT AND PLAN / ED COURSE  Pertinent labs & imaging results that were available during my care of the patient were reviewed by  me and considered in my medical decision making (see chart for details).    Clinical Course as of Oct 30 1319  Wed Oct 29, 2016  1137 Patient presents with reported hematemesis. Also Hemoccult positive stool with hypotension and pallor. Concern for hemorrhagic shock from acute on chronic GI bleed. We'll treat for upper GI bleed, plan to admit. IV fluid boluses for initial resuscitation. We'll follow-up blood count.  [PS]  1229 Hb 8, down from 14 3 weeks ago. Will transfuse, admit.   [PS]  1246 D/w Dr. Allen Norris, advises adding on Octreotide, requests pt be admitted at [facility], can plan upper and lower endoscopy once hemodynamically stable.   [PS]    Clinical Course User Index [PS] Carrie Mew, MD     ____________________________________________   FINAL CLINICAL IMPRESSION(S) / ED DIAGNOSES  Final diagnoses:  Acute upper GI bleed  Hemorrhagic shock ([place])      New Prescriptions   No medications on file     Portions of this note were generated with dragon dictation software. Dictation errors may occur despite best attempts at proofreading.    Carrie Mew, MD 10/29/16 1331

## 2016-10-29 NOTE — ED Notes (Signed)
PT states that he wants to leave, Dr. Verdell Carmine at bedside at this time to speak with patient about leaving AMA.

## 2016-10-29 NOTE — ED Notes (Signed)
Dr. Verdell Carmine at bedside at this time to admit patient. This RN remains at bedside for 15 mins per blood transfusion protocol.

## 2016-10-29 NOTE — ED Triage Notes (Signed)
Pt presents to ED via [facility] with c/o rectal bleeding. Per EMS pt has had bleeding from his rectum intermittently x 3 months and is being followed by GI for it, EMS states pt has colon polyps that are bleeding. EMS reports this morning patient had 1 episode of vomiting blood, upon their arrival pt was pale, weak, and sitting on the toilet with a paper towel spotted with bright red blood. EMS states initial BP on scene was 57/37, pt receiving 500cc bolus of NS BP PTA was 96/48. Pt presents to ED with noted weakness, and is pale at this time.

## 2016-10-29 NOTE — ED Notes (Signed)
Gave patient two more blankets.

## 2016-10-29 NOTE — Progress Notes (Signed)
Dr. Allen Norris called and gave order for bowel prep.

## 2016-10-29 NOTE — Progress Notes (Signed)
   10/29/16 1600  Clinical Encounter Type  Visited With Patient;Family;Patient and family together  Visit Type Initial;Other (Comment) (HCPOA Info)  Referral From Nurse  Spiritual Encounters  Spiritual Needs Prayer;Emotional;Other (Comment);Literature Best boy)  Advance Directives (For Healthcare)  Would patient like information on creating a medical advance directive? Yes (Inpatient - patient requests chaplain consult to create a medical advance directive)  [place] responded to consult to drop information for HCPOA; [place] spoke with patient and brother at bedside; [facility] offered spiritual and emotional support along with prayer at patient request. 4:49 PM Gwynn Burly

## 2016-10-30 ENCOUNTER — Encounter: Payer: Self-pay | Admitting: Anesthesiology

## 2016-10-30 ENCOUNTER — Encounter
Admission: EM | Disposition: A | Discharge status: Home / Self Care | Payer: Self-pay | Source: Home / Self Care | Attending: Internal Medicine

## 2016-10-30 ENCOUNTER — Inpatient Hospital Stay: Payer: BLUE CROSS/BLUE SHIELD | Admitting: Registered Nurse

## 2016-10-30 DIAGNOSIS — D539 Nutritional anemia, unspecified: Secondary | ICD-10-CM

## 2016-10-30 DIAGNOSIS — K289 Gastrojejunal ulcer, unspecified as acute or chronic, without hemorrhage or perforation: Secondary | ICD-10-CM

## 2016-10-30 DIAGNOSIS — D62 Acute posthemorrhagic anemia: Secondary | ICD-10-CM

## 2016-10-30 DIAGNOSIS — K920 Hematemesis: Secondary | ICD-10-CM

## 2016-10-30 DIAGNOSIS — Z8719 Personal history of other diseases of the digestive system: Secondary | ICD-10-CM

## 2016-10-30 DIAGNOSIS — K921 Melena: Secondary | ICD-10-CM

## 2016-10-30 DIAGNOSIS — K922 Gastrointestinal hemorrhage, unspecified: Secondary | ICD-10-CM

## 2016-10-30 HISTORY — PX: COLONOSCOPY WITH PROPOFOL: SHX5780

## 2016-10-30 HISTORY — PX: ESOPHAGOGASTRODUODENOSCOPY (EGD) WITH PROPOFOL: SHX5813

## 2016-10-30 LAB — BASIC METABOLIC PANEL
Anion gap: 5 (ref 5–15)
BUN: 30 mg/dL — ABNORMAL HIGH (ref 6–20)
CO2: 23 mmol/L (ref 22–32)
Calcium: 8.5 mg/dL — ABNORMAL LOW (ref 8.9–10.3)
Chloride: 114 mmol/L — ABNORMAL HIGH (ref 101–111)
Creatinine, Ser: 0.82 mg/dL (ref 0.61–1.24)
GFR calc Af Amer: >60 mL/min (ref >60)
GFR calc non Af Amer: >60 mL/min (ref >60)
Glucose, Bld: 129 mg/dL — ABNORMAL HIGH (ref 65–99)
Potassium: 4.3 mmol/L (ref 3.5–5.1)
Sodium: 142 mmol/L (ref 135–145)

## 2016-10-30 LAB — TYPE AND SCREEN
ABO/RH(D): O NEG
Antibody Screen: NEGATIVE
Unit division: 0
Unit division: 0

## 2016-10-30 LAB — CBC
HCT: 29.8 % — ABNORMAL LOW (ref 40.0–52.0)
Hemoglobin: 10 g/dL — ABNORMAL LOW (ref 13.0–18.0)
MCH: 35 pg — ABNORMAL HIGH (ref 26.0–34.0)
MCHC: 33.6 g/dL (ref 32.0–36.0)
MCV: 104.4 fL — ABNORMAL HIGH (ref 80.0–100.0)
Platelets: 76 10*3/uL — ABNORMAL LOW (ref 150–440)
RBC: 2.85 MIL/uL — ABNORMAL LOW (ref 4.40–5.90)
RDW: 18.4 % — ABNORMAL HIGH (ref 11.5–14.5)
WBC: 3 10*3/uL — ABNORMAL LOW (ref 3.8–10.6)

## 2016-10-30 LAB — BPAM RBC
Blood Product Expiration Date: 201804302359
Blood Product Expiration Date: 201805022359
ISSUE DATE / TIME: 201804251332
ISSUE DATE / TIME: 201804251722
Unit Type and Rh: 9500
Unit Type and Rh: 9500

## 2016-10-30 SURGERY — ESOPHAGOGASTRODUODENOSCOPY (EGD) WITH PROPOFOL
Anesthesia: General

## 2016-10-30 MED ORDER — PROPOFOL 10 MG/ML IV BOLUS
INTRAVENOUS | Status: DC | PRN
  Administered 2016-10-30: 70 mg via INTRAVENOUS

## 2016-10-30 MED ORDER — GLYCOPYRROLATE 0.2 MG/ML IJ SOLN
INTRAMUSCULAR | Status: AC
  Filled 2016-10-30: qty 1

## 2016-10-30 MED ORDER — PANTOPRAZOLE SODIUM 40 MG PO TBEC
40.0000 mg | DELAYED_RELEASE_TABLET | Freq: Two times a day (BID) | ORAL | Status: DC
  Administered 2016-10-30 – 2016-10-31 (×3): 40 mg via ORAL
  Filled 2016-10-30 (×3): qty 1

## 2016-10-30 MED ORDER — THIAMINE HCL 100 MG/ML IJ SOLN
100.0000 mg | Freq: Every day | INTRAMUSCULAR | Status: AC
  Administered 2016-10-31 (×2): 100 mg via INTRAVENOUS
  Filled 2016-10-30 (×2): qty 2

## 2016-10-30 MED ORDER — MIDAZOLAM HCL 2 MG/2ML IJ SOLN
INTRAMUSCULAR | Status: AC
  Filled 2016-10-30: qty 2

## 2016-10-30 MED ORDER — PHENYLEPHRINE HCL 10 MG/ML IJ SOLN
INTRAMUSCULAR | Status: DC | PRN
  Administered 2016-10-30 (×3): 100 ug via INTRAVENOUS

## 2016-10-30 MED ORDER — FENTANYL CITRATE (PF) 100 MCG/2ML IJ SOLN
INTRAMUSCULAR | Status: AC
  Filled 2016-10-30: qty 2

## 2016-10-30 MED ORDER — MIDAZOLAM HCL 2 MG/2ML IJ SOLN
INTRAMUSCULAR | Status: DC | PRN
  Administered 2016-10-30: 1 mg via INTRAVENOUS

## 2016-10-30 MED ORDER — EPHEDRINE SULFATE 50 MG/ML IJ SOLN
INTRAMUSCULAR | Status: DC | PRN
  Administered 2016-10-30: 5 mg via INTRAVENOUS

## 2016-10-30 MED ORDER — FENTANYL CITRATE (PF) 100 MCG/2ML IJ SOLN
INTRAMUSCULAR | Status: DC | PRN
  Administered 2016-10-30: 50 ug via INTRAVENOUS

## 2016-10-30 MED ORDER — PROPOFOL 500 MG/50ML IV EMUL
INTRAVENOUS | Status: DC | PRN
  Administered 2016-10-30: 140 ug/kg/min via INTRAVENOUS

## 2016-10-30 MED ORDER — CYANOCOBALAMIN 1000 MCG/ML IJ SOLN
1000.0000 ug | Freq: Once | INTRAMUSCULAR | Status: AC
  Administered 2016-10-31: 1000 ug via INTRAMUSCULAR
  Filled 2016-10-30 (×2): qty 1

## 2016-10-30 MED ORDER — SODIUM CHLORIDE 0.9 % IV SOLN: INTRAVENOUS | Status: DC

## 2016-10-30 NOTE — Progress Notes (Signed)
Patient alert and oriented. On room air, no complaints of shortness of breath / pain. No active bleeding noted. Vitals stable. Coloscopy and endoscopy performed today. Diet has been ordered. Patient up to bathroom with one assist.

## 2016-10-30 NOTE — Anesthesia Postprocedure Evaluation (Signed)
Anesthesia Post Note  Patient: Dylan Ho  Procedure(s) Performed: Procedure(s) (LRB): ESOPHAGOGASTRODUODENOSCOPY (EGD) WITH PROPOFOL (N/A) COLONOSCOPY WITH PROPOFOL (N/A)  Patient location during evaluation: Endoscopy Anesthesia Type: General Level of consciousness: awake and alert Pain management: pain level controlled Vital Signs Assessment: post-procedure vital signs reviewed and stable Respiratory status: spontaneous breathing, nonlabored ventilation, respiratory function stable and patient connected to nasal cannula oxygen Cardiovascular status: blood pressure returned to baseline and stable Postop Assessment: no signs of nausea or vomiting Anesthetic complications: no     Last Vitals:  Vitals:   10/30/16 1230 10/30/16 1240  BP:  116/81  Pulse: 86 88  Resp: (!) 21 20  Temp:      Last Pain:  Vitals:   10/30/16 1157  TempSrc: Tympanic  PainSc:                  Jacinta Penalver S

## 2016-10-30 NOTE — Transfer of Care (Signed)
Immediate Anesthesia Transfer of Care Note  Patient: Dylan Ho  Procedure(s) Performed: Procedure(s): ESOPHAGOGASTRODUODENOSCOPY (EGD) WITH PROPOFOL (N/A) COLONOSCOPY WITH PROPOFOL (N/A)  Patient Location: PACU  Anesthesia Type:General  Level of Consciousness: awake and alert   Airway & Oxygen Therapy: Patient Spontanous Breathing and Patient connected to nasal cannula oxygen  Post-op Assessment: Report given to RN and Post -op Vital signs reviewed and stable  Post vital signs: Reviewed and stable  Last Vitals:  Vitals:   10/30/16 1156 10/30/16 1157  BP: (!) 90/41 (!) 90/41  Pulse: 73 73  Resp: 15 14  Temp:  (!) 36 C    Last Pain:  Vitals:   10/30/16 1157  TempSrc: Tympanic  PainSc:       Patients Stated Pain Goal: 2 (34/62/19 4712)  Complications: No apparent anesthesia complications

## 2016-10-30 NOTE — Op Note (Signed)
[facility] Gastroenterology Patient Name: Dylan Ho Procedure Date: 10/30/2016 11:20 AM MRN: [id number] Account #: [id number] Date of Birth: [birth year] Admit Type: Inpatient Age: 66 Room: [facility] ENDO ROOM 4 Gender: Male Note Status: Finalized Procedure:            Colonoscopy Indications:          Hematochezia Providers:            Lucilla Lame MD, MD Referring MD:         Lavera Guise, MD (Referring MD) Medicines:            Propofol per Anesthesia Complications:        No immediate complications. Procedure:            Pre-Anesthesia Assessment:                       - Prior to the procedure, a History and Physical was                        performed, and patient medications and allergies were                        reviewed. The patient's tolerance of previous                        anesthesia was also reviewed. The risks and benefits of                        the procedure and the sedation options and risks were                        discussed with the patient. All questions were                        answered, and informed consent was obtained. Prior                        Anticoagulants: The patient has taken no previous                        anticoagulant or antiplatelet agents. ASA Grade                        Assessment: II - A patient with mild systemic disease.                        After reviewing the risks and benefits, the patient was                        deemed in satisfactory condition to undergo the                        procedure.                       After obtaining informed consent, the colonoscope was                        passed under direct vision. Throughout the procedure,  the patient's blood pressure, pulse, and oxygen                        saturations were monitored continuously. The                        Colonoscope was introduced through the anus and                        advanced to the the cecum,  identified by appendiceal                        orifice and ileocecal valve. The colonoscopy was                        performed without difficulty. The patient tolerated the                        procedure well. The quality of the bowel preparation                        was fair. Findings:      The perianal and digital rectal examinations were normal.      Bleeding internal hemorrhoids were found. The hemorrhoids were Grade II       (internal hemorrhoids that prolapse but reduce spontaneously).      Multiple small-mouthed diverticula were found in the sigmoid colon. Impression:           - Preparation of the colon was fair.                       - Bleeding internal hemorrhoids.                       - Diverticulosis in the sigmoid colon.                       - No specimens collected.                       - Hemorrhoids likely the cause for the bleeding Recommendation:       - Return patient to hospital ward for ongoing care. Procedure Code(s):    --- Professional ---                       [phone], Colonoscopy, flexible; diagnostic, including                        collection of specimen(s) by brushing or washing, when                        performed (separate procedure) Diagnosis Code(s):    --- Professional ---                       K92.1, Melena (includes Hematochezia) CPT copyright 2016 American Medical Association. All rights reserved. The codes documented in this report are preliminary and upon coder review may  be revised to meet current compliance requirements. Lucilla Lame MD, MD 10/30/2016 11:52:16 AM This report has been signed electronically. Number of Addenda: 0 Note Initiated On: 10/30/2016 11:20 AM Scope Withdrawal Time: 0 hours 5 minutes 0  seconds  Total Procedure Duration: 0 hours 8 minutes 53 seconds       [facility]

## 2016-10-30 NOTE — Progress Notes (Signed)
[facility] [place] NAME: Dylan Ho    MR#:  [id number]  DATE OF BIRTH:  [birth year]  SUBJECTIVE:  CHIEF COMPLAINT:   Chief Complaint  Patient presents with  . Rectal Bleeding     Came with rectal bleed and hematemesis.   Received 2 unit PRBC.   s/p EGD and colonoscopy- found to have gastric ulcer. Internal hemorrhoids.  REVIEW OF SYSTEMS:  CONSTITUTIONAL: No fever, fatigue or weakness.  EYES: No blurred or double vision.  EARS, NOSE, AND THROAT: No tinnitus or ear pain.  RESPIRATORY: No cough, shortness of breath, wheezing or hemoptysis.  CARDIOVASCULAR: No chest pain, orthopnea, edema.  GASTROINTESTINAL: No nausea, vomiting, diarrhea or abdominal pain.  GENITOURINARY: No dysuria, hematuria.  ENDOCRINE: No polyuria, nocturia,  HEMATOLOGY: No anemia, easy bruising or bleeding SKIN: No rash or lesion. MUSCULOSKELETAL: No joint pain or arthritis.   NEUROLOGIC: No tingling, numbness, weakness.  PSYCHIATRY: No anxiety or depression.   ROS  DRUG ALLERGIES:  No Known Allergies  VITALS:  Blood pressure 116/81, pulse 88, temperature (!) 96.8 F (36 C), temperature source Tympanic, resp. rate 20, height 6' (1.829 m), weight 103.4 kg (228 lb), SpO2 100 %.  PHYSICAL EXAMINATION:  GENERAL:  66 y.o.-year-old patient lying in the bed with no acute distress.  EYES: Pupils equal, round, reactive to light and accommodation. No scleral icterus. Extraocular muscles intact.  HEENT: Head atraumatic, normocephalic. Oropharynx and nasopharynx clear.  NECK:  Supple, no jugular venous distention. No thyroid enlargement, no tenderness.  LUNGS: Normal breath sounds bilaterally, no wheezing, rales,rhonchi or crepitation. No use of accessory muscles of respiration.  CARDIOVASCULAR: S1, S2 normal. No murmurs, rubs, or gallops.  ABDOMEN: Soft, nontender, nondistended. Bowel sounds present. No organomegaly or mass.  EXTREMITIES: No pedal edema, cyanosis,  or clubbing.  NEUROLOGIC: Cranial nerves II through XII are intact. Muscle strength 5/5 in all extremities. Sensation intact. Gait not checked.  PSYCHIATRIC: The patient is alert and oriented x 3.  SKIN: No obvious rash, lesion, or ulcer.   Physical Exam LABORATORY PANEL:   CBC  Recent Labs Lab 10/30/16 0528  WBC 3.0*  HGB 10.0*  HCT 29.8*  PLT 76*   ------------------------------------------------------------------------------------------------------------------  Chemistries   Recent Labs Lab 10/29/16 1127 10/29/16 2131 10/30/16 0528  NA 140  --  142  K 3.2*  --  4.3  CL 110  --  114*  CO2 19*  --  23  GLUCOSE 161*  --  129*  BUN 52*  --  30*  CREATININE 1.23  --  0.82  CALCIUM 8.2*  --  8.5*  MG  --  1.9  --   AST 28  --   --   ALT 15*  --   --   ALKPHOS 44  --   --   BILITOT 0.7  --   --    ------------------------------------------------------------------------------------------------------------------  Cardiac Enzymes  Recent Labs Lab 10/29/16 1127  TROPONINI <0.03   ------------------------------------------------------------------------------------------------------------------  RADIOLOGY:  Dg Knee Left Port  Result Date: 10/29/2016 CLINICAL DATA:  Left knee pain and swelling for 2 weeks EXAM: PORTABLE LEFT KNEE - 1-2 VIEW COMPARISON:  None. FINDINGS: Mild degenerative changes are noted in the medial joint space and patellofemoral space. No acute fracture or dislocation is noted. Small joint effusion is seen. IMPRESSION: Mild degenerative change and small joint effusion. Electronically Signed   By: Inez Catalina M.D.   On: 10/29/2016 17:41    ASSESSMENT AND PLAN:  Active Problems:   GI bleed   Hematochezia   Acute upper GI bleed   Hematemesis without nausea   Gastrointestinal ulcer  66 year old male with past medical history of hypothyroidism, hypertension, history of coronary artery disease, alcoholic liver cirrhosis who presents to the  hospital due to hematemesis and also rectal bleeding.  1. GI bleed-patient has symptoms of both an upper and lower GI bleed. Patient has had some intermittent rectal bleeding and also some hematemesis. -He does have a history of liver disease secondary to alcohol. kept on octreotide and Protonix drip. -Hemoglobin has dropped and  transfused 2 units of packed red blood cells. Follow serial hemoglobin- stable. -appreciated gastroenterology consult,    Taken for Upper and Lower GI scope- have ulcer in stomach and internal hemorrhoids.    Stop Octreotide and protonix drip, start oral PPI BID, Full liquid diet.  2. Acute blood loss Anemia - due to GI bleed as mentioned above.  - s/p transfuse 2 units of PRBC's and follow Hg.  - Hg. Was 13.7 in early April and down to 8.3- now 10.    3. 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  - will follow counts. Stable.  4. Hypokalemia - will place on oral potassium supplements and repeat level in a.m.  - checked mg. Level- 1.9.   5. Hypothyroidism - cont. Synthroid.   6. HTN - pt. Was hypotensive and will hold anti-HTn for now. Stable now.   All the records are reviewed and case discussed with Care Management/Social Workerr. Management plans discussed with the patient, family and they are in agreement.  CODE STATUS: full.  TOTAL TIME TAKING CARE OF THIS PATIENT: 35 minutes.     POSSIBLE D/C IN 1-2 DAYS, DEPENDING ON CLINICAL CONDITION.   Vaughan Basta M.D on 10/30/2016   Between 7am to 6pm - Pager - [phone]  After 6pm go to [email or web address] - password [email or web address] [facility]  Office  [phone]  CC: Primary care physician; Lavera Guise, MD  Note: This dictation was prepared with Dragon dictation along with smaller phrase technology. Any transcriptional errors that result from this process are unintentional.

## 2016-10-30 NOTE — Progress Notes (Signed)
Patient still passing   bright red blood from rectum.

## 2016-10-30 NOTE — Progress Notes (Signed)
Patient was educated on bed alarm and purpose of it. Patient verbalized understanding.

## 2016-10-30 NOTE — Anesthesia Procedure Notes (Signed)
Date/Time: 10/30/2016 11:25 AM Performed by: Hedda Slade Pre-anesthesia Checklist: Patient identified, Emergency Drugs available, Suction available and Patient being monitored Patient Re-evaluated:Patient Re-evaluated prior to inductionOxygen Delivery Method: Nasal cannula

## 2016-10-30 NOTE — Anesthesia Post-op Follow-up Note (Cosign Needed)
Anesthesia QCDR form completed.        

## 2016-10-30 NOTE — Op Note (Signed)
[facility] Gastroenterology Patient Name: Dylan Ho Procedure Date: 10/30/2016 11:21 AM MRN: [id number] Account #: [id number] Date of Birth: 03/20/1951 Admit Type: Inpatient Age: 66 Room: [facility] ENDO ROOM 4 Gender: Male Note Status: Finalized Procedure:            Upper GI endoscopy Indications:          Hematemesis Providers:            Lucilla Lame MD, MD Referring MD:         Lavera Guise, MD (Referring MD) Medicines:            Propofol per Anesthesia Complications:        No immediate complications. Procedure:            Pre-Anesthesia Assessment:                       - Prior to the procedure, a History and Physical was                        performed, and patient medications and allergies were                        reviewed. The patient's tolerance of previous                        anesthesia was also reviewed. The risks and benefits of                        the procedure and the sedation options and risks were                        discussed with the patient. All questions were                        answered, and informed consent was obtained. Prior                        Anticoagulants: The patient has taken no previous                        anticoagulant or antiplatelet agents. ASA Grade                        Assessment: II - A patient with mild systemic disease.                        After reviewing the risks and benefits, the patient was                        deemed in satisfactory condition to undergo the                        procedure.                       After obtaining informed consent, the endoscope was                        passed under direct vision. Throughout the procedure,  the patient's blood pressure, pulse, and oxygen                        saturations were monitored continuously. The Endoscope                        was introduced through the mouth, and advanced to the   jejunum. The upper GI endoscopy was accomplished                        without difficulty. The patient tolerated the procedure                        well. Findings:      The examined esophagus was normal.      Evidence of a gastric bypass was found. A gastric pouch with a normal       size was found containing staples and ulceration. The staple line       appeared intact. The gastrojejunal anastomosis was characterized by       ulceration. This was traversed. Removal of a staple was accomplished       with a regular forceps.      The examined jejunum was normal. Impression:           - Normal esophagus.                       - Gastric bypass with a normal-sized pouch and intact                        staple line. Gastrojejunal anastomosis characterized by                        ulceration.                       - Normal examined jejunum.                       - Staple removed. Recommendation:       - Perform a colonoscopy today. Procedure Code(s):    --- Professional ---                       [phone], Esophagogastroduodenoscopy, flexible, transoral;                        with removal of foreign body(s) Diagnosis Code(s):    --- Professional ---                       K28.9, Gastrojejunal ulcer, unspecified as acute or                        chronic, without hemorrhage or perforation                       K92.0, Hematemesis CPT copyright 2016 American Medical Association. All rights reserved. The codes documented in this report are preliminary and upon coder review may  be revised to meet current compliance requirements. Lucilla Lame MD, MD 10/30/2016 11:37:59 AM This report has been signed electronically. Number of Addenda: 0 Note Initiated On: 10/30/2016 11:21 AM      [facility]

## 2016-10-30 NOTE — Anesthesia Preprocedure Evaluation (Addendum)
Anesthesia Evaluation  Patient identified by MRN, date of birth, ID band Patient awake    Reviewed: Allergy & Precautions, NPO status , Patient's Chart, lab work & pertinent test results, reviewed documented beta blocker date and time   Airway Mallampati: IV  TM Distance: >3 FB     Dental  (+) Chipped, Poor Dentition, Dental Advisory Given, Missing   Pulmonary sleep apnea ,           Cardiovascular hypertension, Pt. on medications + CAD       Neuro/Psych Anxiety    GI/Hepatic   Endo/Other  Hypothyroidism Morbid obesity  Renal/GU      Musculoskeletal   Abdominal   Peds  Hematology  (+) anemia ,   Anesthesia Other Findings ETOH hx.gastric bypass. Very poor dentition. No stents.Big thick neck - would be a difficult intubation. Hb 10.  Reproductive/Obstetrics                           Anesthesia Physical Anesthesia Plan  ASA: III  Anesthesia Plan: General   Post-op Pain Management:    Induction: Intravenous  Airway Management Planned:   Additional Equipment:   Intra-op Plan:   Post-operative Plan:   Informed Consent: I have reviewed the patients History and Physical, chart, labs and discussed the procedure including the risks, benefits and alternatives for the proposed anesthesia with the patient or authorized representative who has indicated his/her understanding and acceptance.     Plan Discussed with: CRNA  Anesthesia Plan Comments:         Anesthesia Quick Evaluation

## 2016-10-31 ENCOUNTER — Encounter: Payer: Self-pay | Admitting: Gastroenterology

## 2016-10-31 LAB — BASIC METABOLIC PANEL
Anion gap: 5 (ref 5–15)
BUN: 13 mg/dL (ref 6–20)
CO2: 26 mmol/L (ref 22–32)
Calcium: 8.5 mg/dL — ABNORMAL LOW (ref 8.9–10.3)
Chloride: 112 mmol/L — ABNORMAL HIGH (ref 101–111)
Creatinine, Ser: 0.78 mg/dL (ref 0.61–1.24)
GFR calc Af Amer: >60 mL/min (ref >60)
GFR calc non Af Amer: >60 mL/min (ref >60)
Glucose, Bld: 134 mg/dL — ABNORMAL HIGH (ref 65–99)
Potassium: 3.8 mmol/L (ref 3.5–5.1)
Sodium: 143 mmol/L (ref 135–145)

## 2016-10-31 LAB — CBC
HCT: 27.3 % — ABNORMAL LOW (ref 40.0–52.0)
Hemoglobin: 9.1 g/dL — ABNORMAL LOW (ref 13.0–18.0)
MCH: 35.6 pg — ABNORMAL HIGH (ref 26.0–34.0)
MCHC: 33.4 g/dL (ref 32.0–36.0)
MCV: 106.7 fL — ABNORMAL HIGH (ref 80.0–100.0)
Platelets: 72 10*3/uL — ABNORMAL LOW (ref 150–440)
RBC: 2.55 MIL/uL — ABNORMAL LOW (ref 4.40–5.90)
RDW: 18 % — ABNORMAL HIGH (ref 11.5–14.5)
WBC: 2.4 10*3/uL — ABNORMAL LOW (ref 3.8–10.6)

## 2016-10-31 LAB — ABO/RH: ABO/RH(D): O NEG

## 2016-10-31 MED ORDER — POLYETHYLENE GLYCOL 3350 17 G PO PACK: 17.0000 g | PACK | Freq: Every day | ORAL | 0 refills | Status: DC

## 2016-10-31 MED ORDER — PANTOPRAZOLE SODIUM 40 MG PO TBEC: 40.0000 mg | DELAYED_RELEASE_TABLET | Freq: Two times a day (BID) | ORAL | 0 refills | Status: DC

## 2016-10-31 MED ORDER — BISACODYL 10 MG RE SUPP
10.0000 mg | RECTAL | 0 refills | Status: DC | PRN
Start: 2016-10-31 — End: 2016-12-12

## 2016-10-31 NOTE — Discharge Summary (Signed)
[facility] NAME: Dylan Ho    MR#:  [id number]  DATE OF BIRTH:  05-20-1951  DATE OF ADMISSION:  10/29/2016 ADMITTING PHYSICIAN: Henreitta Leber, MD  DATE OF DISCHARGE:10/31/2016  PRIMARY CARE PHYSICIAN: Lavera Guise, MD    ADMISSION DIAGNOSIS:  Hemorrhagic shock ([place]) [R57.8] Acute upper GI bleed [K92.2]  DISCHARGE DIAGNOSIS:  Active Problems:   GI bleed   Hematochezia   Acute upper GI bleed   Hematemesis without nausea   Gastrointestinal ulcer   SECONDARY DIAGNOSIS:   Past Medical History:  Diagnosis Date  . CAD (coronary artery disease)    No Stents Present- per patient  . Colon polyps   . History of alcohol abuse Last Drink- 2011  . Hypertension   . Sleep apnea   . Thyroid disease     HOSPITAL COURSE:   66 year old male with past medical history of hypothyroidism, hypertension, history of coronary artery disease, alcoholic liver cirrhosis who presents to the hospital due to hematemesis and also rectal bleeding.  1. GI bleed-patient has symptoms of both an upper and lower GI bleed. Patient has had some intermittent rectal bleeding and also some hematemesis. -He does have a history of liver disease secondary to alcohol. kept on octreotide and Protonix drip. -Hemoglobin has dropped and  transfused 2 units of packed red blood cells. Follow serial hemoglobin- stable. -appreciated gastroenterology consult,    Taken for Upper and Lower GI scope- have ulcer in stomach and internal hemorrhoids.    Stop Octreotide and protonix drip, start oral PPI BID, Full liquid diet, upgraded to soft diet and tolerated well.  2. Acute blood loss Anemia - due to GI bleed as mentioned above.  - s/p transfuse 2 units of PRBC's and follow Hg.  - Hg. Was 13.7 in early April and down to 8.3- now 10- 9.   3. 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  - will follow counts. Stable.  4. Hypokalemia -  place on oral potassium supplements and repeat level in a.m.  - checked mg. Level- 1.9.   5. Hypothyroidism - cont. Synthroid.   6. HTN - pt. Was hypotensive and will hold anti-HTn for now. Stable now.  DISCHARGE CONDITIONS:   Stable.  CONSULTS OBTAINED:  Treatment Team:  Lucilla Lame, MD  DRUG ALLERGIES:  No Known Allergies  DISCHARGE MEDICATIONS:   Current Discharge Medication List    START taking these medications   Details  bisacodyl (DULCOLAX) 10 MG suppository Place 1 suppository (10 mg total) rectally as needed for moderate constipation. Qty: 12 suppository, Refills: 0    pantoprazole (PROTONIX) 40 MG tablet Take 1 tablet (40 mg total) by mouth 2 (two) times daily. Qty: 60 tablet, Refills: 0    polyethylene glycol (MIRALAX) packet Take 17 g by mouth daily. Qty: 14 each, Refills: 0      CONTINUE these medications which have NOT CHANGED   Details  amLODipine (NORVASC) 5 MG tablet Take 5 mg by mouth daily.  Refills: 0    levothyroxine (SYNTHROID, LEVOTHROID) 75 MCG tablet Take 75 mcg by mouth daily before breakfast.  Refills: 0    ondansetron (ZOFRAN ODT) 4 MG disintegrating tablet Take 1 tablet (4 mg total) by mouth every 8 (eight) hours as needed for nausea or vomiting. Qty: 10 tablet, Refills: 0      STOP taking these medications     oxyCODONE (ROXICODONE) 5 MG immediate release tablet  traMADol (ULTRAM) 50 MG tablet          DISCHARGE INSTRUCTIONS:    Follow with GI clinic in 2-3 weeks.  If you experience worsening of your admission symptoms, develop shortness of breath, life threatening emergency, suicidal or homicidal thoughts you must seek medical attention immediately by calling 911 or calling your MD immediately  if symptoms less severe.  You Must read complete instructions/literature along with all the possible adverse reactions/side effects for all the Medicines you take and that have been prescribed to you. Take any new Medicines  after you have completely understood and accept all the possible adverse reactions/side effects.   Please note  You were cared for by a hospitalist during your hospital stay. If you have any questions about your discharge medications or the care you received while you were in the hospital after you are discharged, you can call the unit and asked to speak with the hospitalist on call if the hospitalist that took care of you is not available. Once you are discharged, your primary care physician will handle any further medical issues. Please note that NO REFILLS for any discharge medications will be authorized once you are discharged, as it is imperative that you return to your primary care physician (or establish a relationship with a primary care physician if you do not have one) for your aftercare needs so that they can reassess your need for medications and monitor your lab values.    Today   CHIEF COMPLAINT:   Chief Complaint  Patient presents with  . Rectal Bleeding    HISTORY OF PRESENT ILLNESS:  Dylan Ho  is a 66 y.o. 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 Patient says that he was working yesterday and when walking around and getting up at work he felt dizzy and short of breath. He came home this morning and had 3 episodes of hematemesis and also had one episode of loose stools and when he wiped himself it was bloody. He therefore came to the ER for further evaluation. Patient has been having intermittent rectal bleeding now for the past few months and is supposed to see gastroenterology next week for an outpatient colonoscopy but given his acute symptoms presently he presented to the ER for further evaluation and hospitalist services were contacted further treatment and evaluation. Patient denies  any chest pain, abdominal pain, fever or chills or any other associated symptoms presently.   VITAL SIGNS:  Blood pressure (!) 102/54, pulse 71, temperature 97.9 F (36.6 C), temperature source Oral, resp. rate 18, height 6' (1.829 m), weight 103.4 kg (228 lb), SpO2 96 %.  I/O:   Intake/Output Summary (Last 24 hours) at 10/31/16 1017 Last data filed at 10/30/16 2038  Gross per 24 hour  Intake              960 ml  Output                2 ml  Net              958 ml    PHYSICAL EXAMINATION:  GENERAL:  66 y.o.-year-old patient lying in the bed with no acute distress.  EYES: Pupils equal, round, reactive to light and accommodation. No scleral icterus. Extraocular muscles intact.  HEENT: Head atraumatic, normocephalic. Oropharynx and nasopharynx clear.  NECK:  Supple, no jugular venous distention. No thyroid enlargement, no tenderness.  LUNGS: Normal breath sounds bilaterally, no wheezing, rales,rhonchi or crepitation. No use of accessory muscles of respiration.  CARDIOVASCULAR: S1, S2 normal. No murmurs, rubs, or gallops.  ABDOMEN: Soft, non-tender, non-distended. Bowel sounds present. No organomegaly or mass.  EXTREMITIES: No pedal edema, cyanosis, or clubbing.  NEUROLOGIC: Cranial nerves II through XII are intact. Muscle strength 5/5 in all extremities. Sensation intact. Gait not checked.  PSYCHIATRIC: The patient is alert and oriented x 3.  SKIN: No obvious rash, lesion, or ulcer.   DATA REVIEW:   CBC  Recent Labs Lab 10/31/16 0420  WBC 2.4*  HGB 9.1*  HCT 27.3*  PLT 72*    Chemistries   Recent Labs Lab 10/29/16 1127 10/29/16 2131  10/31/16 0420  NA 140  --   < > 143  K 3.2*  --   < > 3.8  CL 110  --   < > 112*  CO2 19*  --   < > 26  GLUCOSE 161*  --   < > 134*  BUN 52*  --   < > 13  CREATININE 1.23  --   < > 0.78  CALCIUM 8.2*  --   < > 8.5*  MG  --  1.9  --   --   AST 28  --   --   --   ALT 15*  --   --   --   ALKPHOS 44  --   --   --   BILITOT 0.7  --    --   --   < > = values in this interval not displayed.  Cardiac Enzymes  Recent Labs Lab 10/29/16 1127  TROPONINI <0.03    Microbiology Results  Results for orders placed or performed during the hospital encounter of 10/29/16  MRSA PCR Screening     Status: None   Collection Time: 10/29/16  3:53 PM  Result Value Ref Range Status   MRSA by PCR NEGATIVE NEGATIVE Final    Comment:        The GeneXpert MRSA Assay (FDA approved for NASAL specimens only), is one component of a comprehensive MRSA colonization surveillance program. It is not intended to diagnose MRSA infection nor to guide or monitor treatment for MRSA infections.     RADIOLOGY:  Dg Knee Left Port  Result Date: 10/29/2016 CLINICAL DATA:  Left knee pain and swelling for 2 weeks EXAM: PORTABLE LEFT KNEE - 1-2 VIEW COMPARISON:  None. FINDINGS: Mild degenerative changes are noted in the medial joint space and patellofemoral space. No acute fracture or dislocation is noted. Small joint effusion is seen. IMPRESSION: Mild degenerative change and small joint effusion. Electronically Signed   By: Inez Catalina M.D.   On: 10/29/2016 17:41    EKG:   Orders placed or performed during the hospital encounter of 10/29/16  . EKG 12-Lead  . EKG 12-Lead  . ED EKG  . EKG 12-Lead  . EKG 12-Lead  . ED EKG      Management plans discussed with the patient, family and they are in agreement.  CODE STATUS:     Code Status Orders        Start     Ordered   10/29/16 1616  Full code  Continuous     10/29/16 1615    Code Status History    Date Active Date Inactive Code Status Order ID Comments User Context   This patient  has a current code status but no historical code status.      TOTAL TIME TAKING CARE OF THIS PATIENT: 35 minutes.    Vaughan Basta M.D on 10/31/2016 at 9:22 AM  Between 7am to 6pm - Pager - [phone]  After 6pm go to [email or web address] - password [email or web address] [facility]   Office  [phone]  CC: Primary care physician; Lavera Guise, MD   Note: This dictation was prepared with Dragon dictation along with smaller phrase technology. Any transcriptional errors that result from this process are unintentional.

## 2016-10-31 NOTE — Progress Notes (Signed)
Patient discharge teaching given, including activity, diet, follow-up appoints, and medications. Patient verbalized understanding of all discharge instructions. IV access was d/c'd by student nurse Bonner Puna. Vitals are stable. Skin is intact except as charted in most recent assessments. Pt refused to be escorted out by NT or RN, to be driven home by family.  Dylan Ho [employer]

## 2016-11-07 ENCOUNTER — Ambulatory Visit: Admit: 2016-11-07 | Payer: BLUE CROSS/BLUE SHIELD | Admitting: Gastroenterology

## 2016-11-07 SURGERY — COLONOSCOPY WITH PROPOFOL
Anesthesia: Choice

## 2016-11-19 ENCOUNTER — Ambulatory Visit (INDEPENDENT_AMBULATORY_CARE_PROVIDER_SITE_OTHER): Payer: BLUE CROSS/BLUE SHIELD | Admitting: Gastroenterology

## 2016-11-19 ENCOUNTER — Other Ambulatory Visit: Payer: Self-pay

## 2016-11-19 ENCOUNTER — Encounter: Payer: Self-pay | Admitting: Gastroenterology

## 2016-11-19 VITALS — BP 114/80 | HR 72 | Temp 97.7°F | Ht 73.0 in | Wt 236.0 lb

## 2016-11-19 DIAGNOSIS — D6800 Von Willebrand disease, unspecified: Secondary | ICD-10-CM | POA: Insufficient documentation

## 2016-11-19 DIAGNOSIS — E7800 Pure hypercholesterolemia, unspecified: Secondary | ICD-10-CM | POA: Insufficient documentation

## 2016-11-19 DIAGNOSIS — R1011 Right upper quadrant pain: Secondary | ICD-10-CM | POA: Insufficient documentation

## 2016-11-19 DIAGNOSIS — G4730 Sleep apnea, unspecified: Secondary | ICD-10-CM | POA: Insufficient documentation

## 2016-11-19 DIAGNOSIS — R768 Other specified abnormal immunological findings in serum: Secondary | ICD-10-CM | POA: Insufficient documentation

## 2016-11-19 DIAGNOSIS — K7460 Unspecified cirrhosis of liver: Secondary | ICD-10-CM | POA: Insufficient documentation

## 2016-11-19 DIAGNOSIS — R0600 Dyspnea, unspecified: Secondary | ICD-10-CM | POA: Insufficient documentation

## 2016-11-19 DIAGNOSIS — K922 Gastrointestinal hemorrhage, unspecified: Secondary | ICD-10-CM

## 2016-11-19 MED ORDER — SUCRALFATE 1 GM/10ML PO SUSP: 1.0000 g | Freq: Four times a day (QID) | ORAL | 1 refills | Status: DC

## 2016-11-19 MED ORDER — FERROUS SULFATE 325 (65 FE) MG PO TABS: 325.0000 mg | ORAL_TABLET | Freq: Every day | ORAL | 3 refills | Status: DC

## 2016-11-19 MED ORDER — HYDROCORTISONE ACETATE 25 MG RE SUPP: 25.0000 mg | Freq: Every evening | RECTAL | 1 refills | Status: AC | PRN

## 2016-11-19 NOTE — Progress Notes (Signed)
Primary Care Physician: Lavera Guise, MD  Primary Gastroenterologist:  Dr. Lucilla Lame  Chief Complaint  Patient presents with  . GI Bleeding    follow up    HPI: Dylan Ho is a 66 y.o. male here for follow-up after being discharged from the hospital with rectal bleeding and anemia. The patient reports that the rectal bleeding is the most part gone.  He does have some spotting of blood.  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  There is no report of any black stools or bloody stools.  The patient's wife states that the patient has appeared more pallor recently.  Current Outpatient Prescriptions  Medication Sig Dispense Refill  . bisacodyl (DULCOLAX) 10 MG suppository Place 1 suppository (10 mg total) rectally as needed for moderate constipation. 12 suppository 0  . levothyroxine (SYNTHROID, LEVOTHROID) 150 MCG tablet levothyroxine 150 mcg tablet    . pantoprazole (PROTONIX) 40 MG tablet Take 1 tablet (40 mg total) by mouth 2 (two) times daily. 60 tablet 0  . amLODipine (NORVASC) 5 MG tablet Take 5 mg by mouth daily.   0  . benzonatate (TESSALON) 100 MG capsule benzonatate 100 mg capsule    . bismuth-metronidazole-tetracycline (PYLERA) 140-125-125 MG capsule Pylera 140 mg-125 mg-125 mg capsule    . carvedilol (COREG) 3.125 MG tablet carvedilol 3.125 mg tablet    . enoxaparin (LOVENOX) 40 MG/0.4ML injection enoxaparin 40 mg/0.4 mL subcutaneous syringe    . ferrous sulfate (FERROUSUL) 325 (65 FE) MG tablet Take 1 tablet (325 mg total) by mouth daily with breakfast. 30 tablet 3  . furosemide (LASIX) 40 MG tablet furosemide 40 mg tablet    . HYDROcodone-acetaminophen (NORCO/VICODIN) 5-325 MG tablet hydrocodone 5 mg-acetaminophen 325 mg tablet    . hydrocortisone (ANUSOL-HC) 25 MG suppository Place 1 suppository (25 mg total) rectally at bedtime as needed for hemorrhoids or itching. 12 suppository 1  . levofloxacin (LEVAQUIN) 500 MG  tablet levofloxacin 500 mg tablet    . levothyroxine (SYNTHROID, LEVOTHROID) 175 MCG tablet levothyroxine 175 mcg tablet    . levothyroxine (SYNTHROID, LEVOTHROID) 75 MCG tablet Take 75 mcg by mouth daily before breakfast.   0  . ondansetron (ZOFRAN-ODT) 4 MG disintegrating tablet ondansetron 4 mg disintegrating tablet    . polyethylene glycol (MIRALAX) packet Take 17 g by mouth daily. (Patient not taking: Reported on 11/19/2016) 14 each 0  . predniSONE (DELTASONE) 10 MG tablet prednisone 10 mg tablet    . sucralfate (CARAFATE) 1 GM/10ML suspension Take 10 mLs (1 g total) by mouth 4 (four) times daily. 420 mL 1   No current facility-administered medications for this visit.     Allergies as of 11/19/2016  . (No Known Allergies)    ROS:  General: Negative for anorexia, weight loss, fever, chills, fatigue, weakness. ENT: Negative for hoarseness, difficulty swallowing , nasal congestion. CV: Negative for chest pain, angina, palpitations, dyspnea on exertion, peripheral edema.  Respiratory: Negative for dyspnea at rest, dyspnea on exertion, cough, sputum, wheezing.  GI: See history of present illness. GU:  Negative for dysuria, hematuria, urinary incontinence, urinary frequency, nocturnal urination.  Endo: Negative for unusual weight change.    Physical Examination:   BP 114/80   Pulse 72   Temp 97.7 F (36.5 C) (Oral)   Ht 6\' 1"  (1.854 m)   Wt 236 lb (107 kg)   BMI 31.14 kg/m   General: Well-nourished, well-developed in no acute distress.  Eyes: No icterus. Conjunctivae pink. Mouth: Oropharyngeal mucosa moist and pink , no lesions erythema or exudate. Lungs: Clear to auscultation bilaterally. Non-labored. Heart: Regular rate and rhythm, no murmurs rubs or gallops.  Abdomen: Bowel sounds are normal, nontender, nondistended, no hepatosplenomegaly or masses, no abdominal bruits or hernia , no rebound or guarding.   Extremities: No lower extremity edema. No clubbing or  deformities. Neuro: Alert and oriented x 3.  Grossly intact. Skin: Warm and dry, no jaundice.   Psych: Alert and cooperative, normal mood and affect.  Labs:    Imaging Studies: Dg Knee Left Port  Result Date: 10/29/2016 CLINICAL DATA:  Left knee pain and swelling for 2 weeks EXAM: PORTABLE LEFT KNEE - 1-2 VIEW COMPARISON:  None. FINDINGS: Mild degenerative changes are noted in the medial joint space and patellofemoral space. No acute fracture or dislocation is noted. Small joint effusion is seen. IMPRESSION: Mild degenerative change and small joint effusion. Electronically Signed   By: Inez Catalina M.D.   On: 10/29/2016 17:41    Assessment and Plan:   Dylan Ho is a 66 y.o. y/o male who had a GI bleed and was admitted to the hospital.  The patient will be started on Anusol suppositories with hydrocortisone for his hemorrhoids.  The patient will also be started on Carafate for his anastomotic ulcer. The patient will be sent for blood work to check his blood count today and he will also be started on iron supplementation.  The patient has been explained the plan and agrees with it.    Lucilla Lame, MD. Marval Regal   Note: This dictation was prepared with Dragon dictation along with smaller phrase technology. Any transcriptional errors that result from this process are unintentional.

## 2016-11-20 ENCOUNTER — Other Ambulatory Visit: Payer: Self-pay

## 2016-11-20 LAB — CBC WITH DIFFERENTIAL/PLATELET
Basophils Absolute: 0 10*3/uL (ref 0.0–0.2)
Basos: 1 %
EOS (ABSOLUTE): 0.1 10*3/uL (ref 0.0–0.4)
Eos: 2 %
Hematocrit: 32 % — ABNORMAL LOW (ref 37.5–51.0)
Hemoglobin: 10.1 g/dL — ABNORMAL LOW (ref 13.0–17.7)
Immature Grans (Abs): 0 10*3/uL (ref 0.0–0.1)
Immature Granulocytes: 0 %
Lymphocytes Absolute: 0.8 10*3/uL (ref 0.7–3.1)
Lymphs: 28 %
MCH: 32.3 pg (ref 26.6–33.0)
MCHC: 31.6 g/dL (ref 31.5–35.7)
MCV: 102 fL — ABNORMAL HIGH (ref 79–97)
Monocytes Absolute: 0.3 10*3/uL (ref 0.1–0.9)
Monocytes: 10 %
Neutrophils Absolute: 1.8 10*3/uL (ref 1.4–7.0)
Neutrophils: 59 %
Platelets: 127 10*3/uL — ABNORMAL LOW (ref 150–379)
RBC: 3.13 x10E6/uL — ABNORMAL LOW (ref 4.14–5.80)
RDW: 16.7 % — ABNORMAL HIGH (ref 12.3–15.4)
WBC: 3 10*3/uL — ABNORMAL LOW (ref 3.4–10.8)

## 2016-11-20 MED ORDER — SUCRALFATE 1 GM/10ML PO SUSP: 1.0000 g | Freq: Four times a day (QID) | ORAL | 1 refills | Status: DC

## 2016-11-24 ENCOUNTER — Telehealth: Payer: Self-pay

## 2016-11-24 NOTE — Telephone Encounter (Signed)
Pt has been informed that his blood count was higher now than it was 2 weeks ago. Also notified pt that pharmacy will not cover carafate suspension and we will change to tablets.

## 2016-11-27 ENCOUNTER — Telehealth: Payer: Self-pay

## 2016-11-27 NOTE — Telephone Encounter (Signed)
-----   Message from Lucilla Lame, MD sent at 11/20/2016  1:04 PM EDT ----- Let the patient know that his blood count was higher now than it was 2 weeks ago.

## 2016-11-27 NOTE — Telephone Encounter (Signed)
Pt has already been notified of blood work.

## 2016-12-12 ENCOUNTER — Ambulatory Visit: Payer: BLUE CROSS/BLUE SHIELD | Admitting: Family Medicine

## 2016-12-12 ENCOUNTER — Ambulatory Visit (INDEPENDENT_AMBULATORY_CARE_PROVIDER_SITE_OTHER): Payer: BLUE CROSS/BLUE SHIELD | Admitting: Family Medicine

## 2016-12-12 ENCOUNTER — Encounter: Payer: Self-pay | Admitting: Family Medicine

## 2016-12-12 VITALS — BP 99/78 | HR 69 | Resp 16 | Ht 73.0 in | Wt 231.0 lb

## 2016-12-12 DIAGNOSIS — I25118 Atherosclerotic heart disease of native coronary artery with other forms of angina pectoris: Secondary | ICD-10-CM

## 2016-12-12 DIAGNOSIS — R739 Hyperglycemia, unspecified: Secondary | ICD-10-CM | POA: Diagnosis not present

## 2016-12-12 DIAGNOSIS — I959 Hypotension, unspecified: Secondary | ICD-10-CM | POA: Diagnosis not present

## 2016-12-12 DIAGNOSIS — H6121 Impacted cerumen, right ear: Secondary | ICD-10-CM | POA: Diagnosis not present

## 2016-12-12 DIAGNOSIS — R2681 Unsteadiness on feet: Secondary | ICD-10-CM | POA: Diagnosis not present

## 2016-12-12 DIAGNOSIS — G4700 Insomnia, unspecified: Secondary | ICD-10-CM | POA: Diagnosis not present

## 2016-12-12 DIAGNOSIS — K289 Gastrojejunal ulcer, unspecified as acute or chronic, without hemorrhage or perforation: Secondary | ICD-10-CM | POA: Diagnosis not present

## 2016-12-12 DIAGNOSIS — E039 Hypothyroidism, unspecified: Secondary | ICD-10-CM | POA: Diagnosis not present

## 2016-12-12 DIAGNOSIS — Z23 Encounter for immunization: Secondary | ICD-10-CM

## 2016-12-12 DIAGNOSIS — D500 Iron deficiency anemia secondary to blood loss (chronic): Secondary | ICD-10-CM | POA: Diagnosis not present

## 2016-12-12 DIAGNOSIS — Z9884 Bariatric surgery status: Secondary | ICD-10-CM | POA: Insufficient documentation

## 2016-12-12 MED ORDER — TRAZODONE HCL 50 MG PO TABS: 50.0000 mg | ORAL_TABLET | Freq: Every evening | ORAL | 2 refills | Status: DC | PRN

## 2016-12-12 NOTE — Progress Notes (Signed)
Date:  12/12/2016   Name:  Dylan Ho   DOB:  [birth year]   MRN:  [id number]  PCP:  Adline Potter, MD    Chief Complaint: Establish Care (Dr. Yancey Flemings transfer as she was very insulting and rude to patient. ); GI Bleeding ([facility] from [facility] ); and Insomnia (Taking 2-3 cold and flu pills nightly just to sleep. )   History of Present Illness:  This is a 66 y.o. male seen for initial visit. Recent hospitalization in April for UGI bleed due to anastomotic gastric ulcer (had GI bypass 3 yrs ago and lost 150#, weight now stable). CBC improved in May but still anemic, GI placed on Carafate along with Prevacid bid and Fe supplement. Still c/o marked fatigue, thinks Synthroid dose too low, was on 175 mcg daily, now on 75 mcg daily. Hx HTN on amlodipine but not taking. Chronic insomnia, taking high doses of OTC cold meds to sleep. Hx CAD with angina yrs ago, told blockages did not require stenting or bypass, no recent CP. HLD in past, not on statin. Hx Etoh cirrhosis, quit drinking 5 yrs ago, last LFTs ok. Uses Lasix prn LE edema. Falls frequently, occ L foot tingling. Hyperglycemia while in hospital. Father died AAA 34, mother died stroke 35, brother with DM, BPH. Tet status unknown, Pneumovax 5 yrs ago, zoster imm 2 yrs ago. Colonoscopy in hospital ok.  Review of Systems:  Review of Systems  Constitutional: Negative for chills and fever.  HENT: Negative for ear pain, sinus pain and trouble swallowing.   Eyes: Negative for pain.  Respiratory: Negative for cough and shortness of breath.   Cardiovascular: Negative for chest pain and palpitations.  Gastrointestinal: Negative for abdominal pain, constipation and diarrhea.  Endocrine: Negative for polydipsia and polyuria.  Genitourinary: Negative for difficulty urinating.  Neurological: Negative for syncope and light-headedness.    Patient Active Problem List   Diagnosis Date Noted  . Insomnia 12/12/2016  . Cirrhosis of liver ([place]) 11/19/2016  . Dyspnea  11/19/2016  . Positive Helicobacter pylori serology 11/19/2016  . Hypercholesterolemia 11/19/2016  . Right upper quadrant pain 11/19/2016  . Sleep apnea 11/19/2016  . Von Willebrand disease ([place]) 11/19/2016  . Hematochezia   . Acute upper GI bleed   . Hematemesis without nausea   . Gastrointestinal ulcer   . GI bleed 10/29/2016  . Morbid obesity ([place]) 01/19/2014  . Nondependent alcohol abuse 01/11/2010  . Hypothyroidism 01/11/2010  . Hyperlipidemia 01/11/2010  . Acute and subacute liver necrosis 01/11/2010  . Coronary atherosclerosis of native coronary artery 09/22/2008  . Essential hypertension 08/24/2008    Prior to Admission medications   Medication Sig Start Date End Date Taking? Authorizing Provider  ferrous sulfate (FERROUSUL) 325 (65 FE) MG tablet Take 1 tablet (325 mg total) by mouth daily with breakfast. 11/19/16  Yes Lucilla Lame, MD  furosemide (LASIX) 40 MG tablet Take 1 tablet by mouth daily as needed.   Yes [provider]  levothyroxine (SYNTHROID, LEVOTHROID) 75 MCG tablet Take 150 mcg by mouth daily before breakfast. 09/16/16  Yes [provider]  pantoprazole (PROTONIX) 40 MG tablet Take 1 tablet (40 mg total) by mouth 2 (two) times daily. 10/31/16  Yes Vaughan Basta, MD  sucralfate (CARAFATE) 1 g tablet Take 1 g by mouth 4 (four) times daily -  with meals and at bedtime.    [provider]  traZODone (DESYREL) 50 MG tablet Take 1 tablet (50 mg total) by mouth at bedtime as needed for  sleep. 12/12/16   Adline Potter, MD    No Known Allergies  Past Surgical History:  Procedure Laterality Date  . BILATERAL TOTAL SHOULDER ARTHROPLASTY  1990's  . COLONOSCOPY WITH PROPOFOL N/A 10/30/2016   Procedure: COLONOSCOPY WITH PROPOFOL;  Surgeon: Lucilla Lame, MD;  Location: [facility] ENDOSCOPY;  Service: Endoscopy;  Laterality: N/A;  . ESOPHAGOGASTRODUODENOSCOPY (EGD) WITH PROPOFOL N/A 10/30/2016   Procedure: ESOPHAGOGASTRODUODENOSCOPY (EGD) WITH  PROPOFOL;  Surgeon: Lucilla Lame, MD;  Location: [facility] ENDOSCOPY;  Service: Endoscopy;  Laterality: N/A;  . GASTRIC BYPASS  2016   Dr. Darnell Level- [place], [place]  . REPLACEMENT TOTAL KNEE BILATERAL  1990's  . TONSILLECTOMY  2004    Social History  Substance Use Topics  . Smoking status: Never Smoker  . Smokeless tobacco: Never Used  . Alcohol use No     Comment: Heavy ETOH in past- Last Drink 2011 per patient    Family History  Problem Relation Age of Onset  . Cancer Mother        Breast  . Heart disease Father   . Stroke Father   . AAA (abdominal aortic aneurysm) Father     Medication list has been reviewed and updated.  Physical Examination: BP 99/78   Pulse 69   Resp 16   Ht 6\' 1"  (1.854 m)   Wt 231 lb (104.8 kg)   SpO2 98%   BMI 30.48 kg/m   Physical Exam  Constitutional: He is oriented to person, place, and time. He appears well-developed and well-nourished.  HENT:  Head: Atraumatic.  Right Ear: External ear normal.  Left Ear: External ear normal.  Nose: Nose normal.  Mouth/Throat: Oropharynx is clear and moist.  R cerumen impaction  Eyes: Conjunctivae and EOM are normal. Pupils are equal, round, and reactive to light. No scleral icterus.  Neck: Neck supple. No thyromegaly present.  Cardiovascular: Normal rate, regular rhythm, normal heart sounds and intact distal pulses.   Pulmonary/Chest: Effort normal and breath sounds normal.  Abdominal: Soft. He exhibits no distension and no mass. There is no tenderness.  Musculoskeletal: He exhibits no edema.  Lymphadenopathy:    He has no cervical adenopathy.  Neurological: He is alert and oriented to person, place, and time. Coordination normal.  Romberg positive, gait wide-based and unsteady  Skin: Skin is warm and dry.  Psychiatric: He has a normal mood and affect. His behavior is normal.  Nursing note and vitals reviewed.   Assessment and Plan:  1. Atherosclerosis of native coronary artery of native heart with stable  angina pectoris ([place]) Consider BB/ACEI/asa/statin once stable - Comprehensive Metabolic Panel (CMET) - CBC - Lipid Profile  2. Gastrointestinal ulcer Cont Prevacid/Carafate per GI for now for now, consider d/c Carafate, decrease Prevacid to daily next visit  3. Anemia due to blood loss Recheck CBC, cont Fe supplement  4. Hypothyroidism, unspecified type Double Synthroid dose to 150 mcg daily over weekend, adjust per TSH - TSH  5. Hypotension, unspecified hypotension type D/c amlodipine (not taking anyway), may improve with anemia and hypothyroid rx, d/c cold meds  6. Hyperglycemia FH DM - HgB A1c  7. Gait instability Hypothyroidism may be contributing - B12  8. Insomnia, unspecified type Stop OTC meds, begin trazodone 50 mg qhs, call if ineffective  9. Impacted cerumen of right ear OTC wax removal system  10. Need for vaccination against Streptococcus pneumoniae using pneumococcal conjugate vaccine 13 Consider Tdap next visit - Pneumococcal conjugate vaccine 13-valent  Return in about 4 weeks (around 01/09/2017).  45 mins spent with pt over half in counseling  Satira Anis. [facility]  12/12/2016

## 2016-12-13 LAB — COMPREHENSIVE METABOLIC PANEL
ALT: 15 IU/L (ref 0–44)
AST: 26 IU/L (ref 0–40)
Albumin/Globulin Ratio: 2.4 — ABNORMAL HIGH (ref 1.2–2.2)
Albumin: 4.4 g/dL (ref 3.6–4.8)
Alkaline Phosphatase: 70 IU/L (ref 39–117)
BUN/Creatinine Ratio: 23 (ref 10–24)
BUN: 23 mg/dL (ref 8–27)
Bilirubin Total: 0.4 mg/dL (ref 0.0–1.2)
CO2: 20 mmol/L (ref 18–29)
Calcium: 9 mg/dL (ref 8.6–10.2)
Chloride: 109 mmol/L — ABNORMAL HIGH (ref 96–106)
Creatinine, Ser: 1.02 mg/dL (ref 0.76–1.27)
GFR calc Af Amer: 89 mL/min/{1.73_m2} (ref >59)
GFR calc non Af Amer: 77 mL/min/{1.73_m2} (ref >59)
Globulin, Total: 1.8 g/dL (ref 1.5–4.5)
Glucose: 84 mg/dL (ref 65–99)
Potassium: 4.9 mmol/L (ref 3.5–5.2)
Sodium: 142 mmol/L (ref 134–144)
Total Protein: 6.2 g/dL (ref 6.0–8.5)

## 2016-12-13 LAB — CBC
Hematocrit: 35.4 % — ABNORMAL LOW (ref 37.5–51.0)
Hemoglobin: 11.4 g/dL — ABNORMAL LOW (ref 13.0–17.7)
MCH: 33.9 pg — ABNORMAL HIGH (ref 26.6–33.0)
MCHC: 32.2 g/dL (ref 31.5–35.7)
MCV: 105 fL — ABNORMAL HIGH (ref 79–97)
Platelets: 171 10*3/uL (ref 150–379)
RBC: 3.36 x10E6/uL — ABNORMAL LOW (ref 4.14–5.80)
RDW: 18.1 % — ABNORMAL HIGH (ref 12.3–15.4)
WBC: 3.6 10*3/uL (ref 3.4–10.8)

## 2016-12-13 LAB — LIPID PANEL
Chol/HDL Ratio: 2.4 ratio (ref 0.0–5.0)
Cholesterol, Total: 112 mg/dL (ref 100–199)
HDL: 46 mg/dL (ref >39)
LDL Calculated: 47 mg/dL (ref 0–99)
Triglycerides: 97 mg/dL (ref 0–149)
VLDL Cholesterol Cal: 19 mg/dL (ref 5–40)

## 2016-12-13 LAB — HEMOGLOBIN A1C
Est. average glucose Bld gHb Est-mCnc: 88 mg/dL
Hgb A1c MFr Bld: 4.7 % — ABNORMAL LOW (ref 4.8–5.6)

## 2016-12-13 LAB — VITAMIN B12: Vitamin B-12: 498 pg/mL (ref 232–1245)

## 2016-12-13 LAB — TSH: TSH: 25.95 u[IU]/mL — ABNORMAL HIGH (ref 0.450–4.500)

## 2016-12-15 ENCOUNTER — Telehealth: Payer: Self-pay | Admitting: Family Medicine

## 2016-12-15 NOTE — Telephone Encounter (Signed)
Pt returned call for lab results. Pt stated that he doubled his thyroid medication as directed and he is still feeling fatigue. Pt request a call back tomorrow b/c he has a meeting this afternoon. Please advise. Thanks TNP

## 2016-12-16 ENCOUNTER — Other Ambulatory Visit: Payer: Self-pay | Admitting: Family Medicine

## 2016-12-16 MED ORDER — LEVOTHYROXINE SODIUM 175 MCG PO TABS: 175.0000 ug | ORAL_TABLET | Freq: Every day | ORAL | 2 refills | Status: DC

## 2016-12-16 NOTE — Telephone Encounter (Signed)
Pt said he doubled his thyroid medicine and is still feeling fatigued. Any suggestions?

## 2016-12-16 NOTE — Telephone Encounter (Signed)
Spoke with patient and conveyed lab results by Dr. Vicente Masson. Also let patient know that new dose of Synthroid has been sent in and should make a difference in his energy level but needs to give it a few weeks to feel full effect. He voiced understanding.

## 2016-12-16 NOTE — Telephone Encounter (Signed)
Will send in rx for previous Synthroid dose (175 mcg daily). This is higher than current double dose but it will take several weeks for the higher dose to have full effect.

## 2017-01-09 ENCOUNTER — Ambulatory Visit (INDEPENDENT_AMBULATORY_CARE_PROVIDER_SITE_OTHER): Payer: BLUE CROSS/BLUE SHIELD | Admitting: Family Medicine

## 2017-01-09 ENCOUNTER — Encounter: Payer: Self-pay | Admitting: Family Medicine

## 2017-01-09 ENCOUNTER — Other Ambulatory Visit: Payer: Self-pay

## 2017-01-09 VITALS — BP 113/76 | HR 69 | Resp 16 | Ht 73.0 in | Wt 235.0 lb

## 2017-01-09 DIAGNOSIS — Z23 Encounter for immunization: Secondary | ICD-10-CM | POA: Diagnosis not present

## 2017-01-09 DIAGNOSIS — G4700 Insomnia, unspecified: Secondary | ICD-10-CM | POA: Diagnosis not present

## 2017-01-09 DIAGNOSIS — E669 Obesity, unspecified: Secondary | ICD-10-CM | POA: Diagnosis not present

## 2017-01-09 DIAGNOSIS — I2510 Atherosclerotic heart disease of native coronary artery without angina pectoris: Secondary | ICD-10-CM | POA: Diagnosis not present

## 2017-01-09 DIAGNOSIS — E538 Deficiency of other specified B group vitamins: Secondary | ICD-10-CM | POA: Diagnosis not present

## 2017-01-09 DIAGNOSIS — G4730 Sleep apnea, unspecified: Secondary | ICD-10-CM

## 2017-01-09 DIAGNOSIS — D500 Iron deficiency anemia secondary to blood loss (chronic): Secondary | ICD-10-CM | POA: Insufficient documentation

## 2017-01-09 DIAGNOSIS — N3281 Overactive bladder: Secondary | ICD-10-CM

## 2017-01-09 DIAGNOSIS — E039 Hypothyroidism, unspecified: Secondary | ICD-10-CM | POA: Diagnosis not present

## 2017-01-09 DIAGNOSIS — M170 Bilateral primary osteoarthritis of knee: Secondary | ICD-10-CM | POA: Insufficient documentation

## 2017-01-09 MED ORDER — TRAZODONE HCL 50 MG PO TABS: 100.0000 mg | ORAL_TABLET | Freq: Every evening | ORAL | 3 refills | Status: DC | PRN

## 2017-01-09 MED ORDER — OXYBUTYNIN CHLORIDE ER 5 MG PO TB24: 5.0000 mg | ORAL_TABLET | Freq: Every day | ORAL | 2 refills | Status: DC

## 2017-01-09 MED ORDER — ACETAMINOPHEN 500 MG PO TABS: 1000.0000 mg | ORAL_TABLET | Freq: Two times a day (BID) | ORAL | 0 refills | Status: DC

## 2017-01-09 NOTE — Progress Notes (Signed)
Date:  01/09/2017   Name:  Dylan Ho   DOB:  [birth year]   MRN:  [id number]  PCP:  Adline Potter, MD    Chief Complaint: Chest Pain (follow up-); Hypertension (85/57 63/39 97/64); Leg Swelling; Insomnia; Knee Pain (left knee pain 12 months or more ); Nocturia; and Gait Problem (unstable balance )   History of Present Illness:  This is a 66 y.o. male seen in one month f/u. Thyroid level low and still anemic due to UGIB last visit. Synthroid dose increased. Stopped iron supp due to constipation. C/o persistent fatigue, B knee pain, urinary urgency, and spraying of urine. Nocturia x 1 only. Sleep apnea before gastric bypass surgery, no longer snores. Told heart not pumping well in past, gets occ BLE edema but improves overnight. Insomnia improved but having to take 3-4 trazodone tabs nightly. Received B12 shots in hospital, taking oral B12 supp now.  Review of Systems:  Review of Systems  Constitutional: Negative for chills and fever.  Respiratory: Negative for cough and shortness of breath.   Cardiovascular: Negative for chest pain and palpitations.  Gastrointestinal: Negative for abdominal pain.  Genitourinary: Negative for dysuria.  Neurological: Negative for syncope and light-headedness.  Psychiatric/Behavioral: Negative for dysphoric mood.    Patient Active Problem List   Diagnosis Date Noted  . Anemia due to blood loss 01/09/2017  . B12 deficiency 01/09/2017  . 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 Osteoarthritis of both knees 01/09/2017  . Insomnia 12/12/2016  . Hx of gastric bypass 12/12/2016  . Cirrhosis of liver ([place]) 11/19/2016  . Dyspnea 11/19/2016  . Positive Helicobacter pylori serology 11/19/2016  . Hypercholesterolemia 11/19/2016  . Right upper quadrant pain 11/19/2016  . Sleep apnea 11/19/2016  . Von Willebrand disease ([place]) 11/19/2016  . Hematochezia   . Acute upper GI bleed   . Hematemesis without nausea   . Gastrointestinal ulcer   . GI bleed 10/29/2016   . Morbid obesity ([place]) 01/19/2014  . Nondependent alcohol abuse 01/11/2010  . Hypothyroidism 01/11/2010  . Hyperlipidemia 01/11/2010  . Acute and subacute liver necrosis 01/11/2010  . Coronary atherosclerosis of native coronary artery 09/22/2008  . Essential hypertension 08/24/2008    Prior to Admission medications   Medication Sig Start Date End Date Taking? Authorizing Provider  levothyroxine (SYNTHROID, LEVOTHROID) 175 MCG tablet Take 1 tablet (175 mcg total) by mouth daily before breakfast. 12/16/16  Yes Pranavi Aure, Gwyndolyn Saxon, MD  Multiple Vitamin (MULTIVITAMIN) capsule Take 1 capsule by mouth daily.   Yes [provider]  pantoprazole (PROTONIX) 40 MG tablet Take 1 tablet (40 mg total) by mouth 2 (two) times daily. 10/31/16  Yes Vaughan Basta, MD  sucralfate (CARAFATE) 1 g tablet Take 1 g by mouth 4 (four) times daily -  with meals and at bedtime.   Yes [provider]  traZODone (DESYREL) 50 MG tablet Take 1 tablet (50 mg total) by mouth at bedtime as needed for sleep. 12/12/16  Yes Knoxx Boeding, Gwyndolyn Saxon, MD  vitamin B-12 (CYANOCOBALAMIN) 500 MCG tablet Take 500 mcg by mouth daily.   Yes [provider]  acetaminophen (TYLENOL) 500 MG tablet Take 2 tablets (1,000 mg total) by mouth 2 (two) times daily. 01/09/17   Jessie Schrieber, Gwyndolyn Saxon, MD  oxybutynin (DITROPAN XL) 5 MG 24 hr tablet Take 1 tablet (5 mg total) by mouth at bedtime. 01/09/17   Adline Potter, MD    No Known Allergies  Past Surgical History:  Procedure Laterality Date  . BILATERAL TOTAL SHOULDER ARTHROPLASTY  1990's  .  COLONOSCOPY WITH PROPOFOL N/A 10/30/2016   Procedure: COLONOSCOPY WITH PROPOFOL;  Surgeon: Lucilla Lame, MD;  Location: [facility] ENDOSCOPY;  Service: Endoscopy;  Laterality: N/A;  . ESOPHAGOGASTRODUODENOSCOPY (EGD) WITH PROPOFOL N/A 10/30/2016   Procedure: ESOPHAGOGASTRODUODENOSCOPY (EGD) WITH PROPOFOL;  Surgeon: Lucilla Lame, MD;  Location: [facility] ENDOSCOPY;  Service: Endoscopy;  Laterality: N/A;  .  GASTRIC BYPASS  2016   Dr. Darnell Level- [place], [place]  . REPLACEMENT TOTAL KNEE BILATERAL  1990's  . TONSILLECTOMY  2004    Social History  Substance Use Topics  . Smoking status: Never Smoker  . Smokeless tobacco: Never Used  . Alcohol use No     Comment: Heavy ETOH in past- Last Drink 2011 per patient    Family History  Problem Relation Age of Onset  . Cancer Mother        Breast  . Heart disease Father   . Stroke Father   . AAA (abdominal aortic aneurysm) Father     Medication list has been reviewed and updated.  Physical Examination: BP 113/76 Comment: patients wrist  Pulse 69   Resp 16   Ht 6\' 1"  (1.854 m)   Wt 235 lb (106.6 kg)   SpO2 100%   BMI 31.00 kg/m   Physical Exam  Constitutional: He appears well-developed and well-nourished.  Cardiovascular: Normal rate, regular rhythm and normal heart sounds.   Pulmonary/Chest: Effort normal and breath sounds normal.  Musculoskeletal: He exhibits no edema.  Neurological: He is alert.  Skin: Skin is warm and dry.  Psychiatric: He has a normal mood and affect. His behavior is normal.  Nursing note and vitals reviewed.   Assessment and Plan:  1. Hypothyroidism, unspecified type On increased Synthroid dose - TSH  2. Anemia due to blood loss Improving, off Fe supp, consider d/c Carafate and decrease Prevacid to daily when stable - CBC  3. Detrusor instability Trial Ditropan XL - Ambulatory referral to Urology  4. Atherosclerosis of native coronary artery of native heart without angina pectoris Consider echo if fatigue persists  5. Primary osteoarthritis of both knees Tylenol 1000 mg bid, consider ortho referral if sxs persist  6. Sleep apnea, unspecified type Off CPAP with weight loss, consider repeat sleep study if fatigue persists  7. Insomnia, unspecified type Improved on trazodone  8. Obesity (BMI 30.0-34.9) S/p bariatric surgery, weight up 4#, exercise/weight loss discussed  9. B12 deficiency On  supplement - B12  10. Need for diphtheria-tetanus-pertussis (Tdap) vaccine - Tdap vaccine greater than or equal to 7yo IM  Return in about 4 weeks (around 02/06/2017).  Satira Anis. [facility]  01/09/2017

## 2017-01-10 LAB — CBC
Hematocrit: 33.1 % — ABNORMAL LOW (ref 37.5–51.0)
Hemoglobin: 10.8 g/dL — ABNORMAL LOW (ref 13.0–17.7)
MCH: 31.4 pg (ref 26.6–33.0)
MCHC: 32.6 g/dL (ref 31.5–35.7)
MCV: 96 fL (ref 79–97)
Platelets: 145 10*3/uL — ABNORMAL LOW (ref 150–379)
RBC: 3.44 x10E6/uL — ABNORMAL LOW (ref 4.14–5.80)
RDW: 17.2 % — ABNORMAL HIGH (ref 12.3–15.4)
WBC: 3 10*3/uL — ABNORMAL LOW (ref 3.4–10.8)

## 2017-01-10 LAB — TSH: TSH: 0.195 u[IU]/mL — ABNORMAL LOW (ref 0.450–4.500)

## 2017-01-10 LAB — VITAMIN B12: Vitamin B-12: 422 pg/mL (ref 232–1245)

## 2017-01-12 ENCOUNTER — Other Ambulatory Visit: Payer: Self-pay | Admitting: Family Medicine

## 2017-01-12 ENCOUNTER — Encounter: Payer: Self-pay | Admitting: Family Medicine

## 2017-01-12 MED ORDER — PANTOPRAZOLE SODIUM 40 MG PO TBEC: 40.0000 mg | DELAYED_RELEASE_TABLET | Freq: Every day | ORAL | 0 refills | Status: DC

## 2017-01-13 ENCOUNTER — Encounter: Payer: Self-pay | Admitting: Family Medicine

## 2017-01-14 ENCOUNTER — Other Ambulatory Visit: Payer: Self-pay | Admitting: Family Medicine

## 2017-01-14 MED ORDER — LACTULOSE 10 G PO PACK: 10.0000 g | PACK | Freq: Two times a day (BID) | ORAL | 2 refills | Status: DC

## 2017-01-15 ENCOUNTER — Telehealth: Payer: Self-pay

## 2017-01-15 ENCOUNTER — Other Ambulatory Visit: Payer: Self-pay | Admitting: Family Medicine

## 2017-01-15 MED ORDER — LACTULOSE 10 GM/15ML PO SOLN: 10.0000 g | Freq: Two times a day (BID) | ORAL | 2 refills | Status: DC

## 2017-01-15 NOTE — Telephone Encounter (Signed)
Patient uses liquid form of Lactulose not Powder. Powder is not covered by insurance.

## 2017-01-15 NOTE — Telephone Encounter (Signed)
Done

## 2017-01-16 ENCOUNTER — Encounter: Payer: Self-pay | Admitting: Family Medicine

## 2017-01-16 NOTE — Telephone Encounter (Signed)
Pt Message.

## 2017-01-26 ENCOUNTER — Ambulatory Visit: Payer: BLUE CROSS/BLUE SHIELD

## 2017-02-04 ENCOUNTER — Encounter: Payer: Self-pay | Admitting: Family Medicine

## 2017-02-05 ENCOUNTER — Other Ambulatory Visit: Payer: Self-pay | Admitting: Family Medicine

## 2017-02-05 MED ORDER — TRAZODONE HCL 150 MG PO TABS: 150.0000 mg | ORAL_TABLET | Freq: Every evening | ORAL | 2 refills | Status: DC | PRN

## 2017-02-05 NOTE — Telephone Encounter (Signed)
Patient MyChart message. Please advise.

## 2017-02-09 ENCOUNTER — Ambulatory Visit (INDEPENDENT_AMBULATORY_CARE_PROVIDER_SITE_OTHER): Payer: BLUE CROSS/BLUE SHIELD | Admitting: Family Medicine

## 2017-02-09 ENCOUNTER — Encounter: Payer: Self-pay | Admitting: Family Medicine

## 2017-02-09 VITALS — BP 132/82 | HR 64 | Resp 16 | Ht 73.0 in | Wt 222.0 lb

## 2017-02-09 DIAGNOSIS — E669 Obesity, unspecified: Secondary | ICD-10-CM | POA: Diagnosis not present

## 2017-02-09 DIAGNOSIS — G4700 Insomnia, unspecified: Secondary | ICD-10-CM | POA: Diagnosis not present

## 2017-02-09 DIAGNOSIS — I2510 Atherosclerotic heart disease of native coronary artery without angina pectoris: Secondary | ICD-10-CM

## 2017-02-09 DIAGNOSIS — D500 Iron deficiency anemia secondary to blood loss (chronic): Secondary | ICD-10-CM

## 2017-02-09 DIAGNOSIS — E538 Deficiency of other specified B group vitamins: Secondary | ICD-10-CM | POA: Diagnosis not present

## 2017-02-09 DIAGNOSIS — E039 Hypothyroidism, unspecified: Secondary | ICD-10-CM | POA: Diagnosis not present

## 2017-02-09 DIAGNOSIS — M170 Bilateral primary osteoarthritis of knee: Secondary | ICD-10-CM | POA: Diagnosis not present

## 2017-02-09 MED ORDER — LACTULOSE 10 GM/15ML PO SOLN: 10.0000 g | Freq: Two times a day (BID) | ORAL | 2 refills | Status: DC | PRN

## 2017-02-09 NOTE — Progress Notes (Signed)
Date:  02/09/2017   Name:  Dylan Ho   DOB:  09-16-1950   MRN:  [id number]  PCP:  Dylan Potter, Ho    Chief Complaint: Hypothyroidism (Follow up: Dylan Ho  had knee injection and all knee pain is gone! )   History of Present Illness:  This is a 66 y.o. male seen for one month f/u. On increased Synthroid dose. Recent UGI bleed, off Carafate, taking PPI only prn indigestion. Ditropan did not help urinary sxs, to see urology tomorrow. Cortisone shot L knee helped a lot, also less fatigue. Insomnia improved on increased trazodone, taking B12 supp, constipation resolved.  Review of Systems:  Review of Systems  Constitutional: Negative for chills and fever.  Respiratory: Negative for cough and shortness of breath.   Cardiovascular: Negative for chest pain and leg swelling.  Gastrointestinal: Negative for abdominal pain.  Neurological: Negative for syncope and light-headedness.    Patient Active Problem List   Diagnosis Date Noted  . Anemia due to blood loss 01/09/2017  . B12 deficiency 01/09/2017  . 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 Osteoarthritis of both knees 01/09/2017  . Insomnia 12/12/2016  . Hx of gastric bypass 12/12/2016  . Cirrhosis of liver ([place]) 11/19/2016  . Dyspnea 11/19/2016  . Positive Helicobacter pylori serology 11/19/2016  . Hypercholesterolemia 11/19/2016  . Right upper quadrant pain 11/19/2016  . Sleep apnea 11/19/2016  . Von Willebrand disease ([place]) 11/19/2016  . Hematochezia   . History of upper gastrointestinal bleeding   . Hematemesis without nausea   . Gastrointestinal ulcer   . GI bleed 10/29/2016  . Obesity (BMI 30.0-34.9) 01/19/2014  . Nondependent alcohol abuse 01/11/2010  . Hypothyroidism 01/11/2010  . Hyperlipidemia 01/11/2010  . Acute and subacute liver necrosis 01/11/2010  . Coronary atherosclerosis of native coronary artery 09/22/2008  . Essential hypertension 08/24/2008    Prior to Admission medications   Medication Sig Start Date End  Date Taking? Authorizing Provider  lactulose (CHRONULAC) 10 GM/15ML solution Take 15 mLs (10 g total) by mouth 2 (two) times daily as needed for mild constipation. 02/09/17  Yes Dylan Ho  levothyroxine (SYNTHROID, LEVOTHROID) 175 MCG tablet Take 1 tablet (175 mcg total) by mouth daily before breakfast. 12/16/16  Yes Dylan Ho  Multiple Vitamin (MULTIVITAMIN) capsule Take 1 capsule by mouth daily.   Yes Provider, Historical, Ho  pantoprazole (PROTONIX) 40 MG tablet Take 1 tablet (40 mg total) by mouth daily. 01/12/17  Yes Dylan Ho  traZODone (DESYREL) 150 MG tablet Take 1 tablet (150 mg total) by mouth at bedtime as needed for sleep. 02/05/17  Yes Dylan Ho  vitamin B-12 (CYANOCOBALAMIN) 500 MCG tablet Take 500 mcg by mouth daily.   Yes Provider, Historical, Ho    No Known Allergies  Past Surgical History:  Procedure Laterality Date  . BILATERAL TOTAL SHOULDER ARTHROPLASTY  1990's  . COLONOSCOPY WITH PROPOFOL N/A 10/30/2016   Procedure: COLONOSCOPY WITH PROPOFOL;  Surgeon: Dylan Ho;  Location: [facility] END[facility]COPY;  Service: Endoscopy;  Laterality: N/A;  . ESOPHAGOGASTRODUODENOSCOPY (EGD) WITH PROPOFOL N/A 10/30/2016   Procedure: ESOPHAGOGASTRODUODENOSCOPY (EGD) WITH PROPOFOL;  Surgeon: Dylan Ho;  Location: [facility];  Service: Endoscopy;  Laterality: N/A;  . GASTRIC BYPASS  2016   Dylan Ho  . REPLACEMENT TOTAL KNEE BILATERAL  1990's  . TONSILLECTOMY  2004    Social History  Substance Use Topics  . Smoking status: Never Smoker  . Smokeless tobacco: Never Used  . Alcohol use No  Comment: Heavy ETOH in past- Last Drink 2011 per patient    Family History  Problem Relation Age of Onset  . Cancer Mother        Breast  . Heart disease Father   . Stroke Father   . AAA (abdominal aortic aneurysm) Father     Medication list has been reviewed and updated.  Physical Examination: BP 132/82   Pulse 64   Resp 16   Ht 6\' 1"  (1.854  m)   Wt 222 lb (100.7 kg)   SpO2 100%   BMI 29.29 kg/m   Physical Exam  Constitutional: He appears well-developed and well-nourished.  Cardiovascular: Normal rate, regular rhythm and normal heart sounds.   Pulmonary/Chest: Effort normal and breath sounds normal.  Musculoskeletal: He exhibits no edema.  Neurological: He is alert.  Skin: Skin is warm and dry.  Psychiatric: He has a normal mood and affect. His behavior is normal.  Nursing note and vitals reviewed.   Assessment and Plan:  1. Atherosclerosis of native coronary artery of native heart without angina pectoris Avoiding asa due to UGIB, LDL 47 off statin  2. Anemia due to blood loss H/H slightly worse last visit, may need iron, recommend continue daily PPI for now - CBC  3. Hypothyroidism, unspecified type On increased Synthroid dose - TSH  4. Primary osteoarthritis of both knees Improved with cortisone injections per ortho  5. Insomnia, unspecified type Improved on trazodone  6. B12 deficiency Well controlled on supplement  7. Obesity (BMI 30.0-34.9) Weight down 9#, continue exercise/weight loss  Return in about 6 months (around 08/12/2017).  Dylan Ho  02/09/2017

## 2017-02-10 ENCOUNTER — Encounter: Payer: Self-pay | Admitting: Urology

## 2017-02-10 ENCOUNTER — Telehealth: Payer: Self-pay | Admitting: Radiology

## 2017-02-10 ENCOUNTER — Ambulatory Visit (INDEPENDENT_AMBULATORY_CARE_PROVIDER_SITE_OTHER): Payer: BLUE CROSS/BLUE SHIELD | Admitting: Urology

## 2017-02-10 ENCOUNTER — Encounter: Payer: Self-pay | Admitting: Radiology

## 2017-02-10 ENCOUNTER — Other Ambulatory Visit: Payer: Self-pay | Admitting: Radiology

## 2017-02-10 ENCOUNTER — Other Ambulatory Visit: Payer: Self-pay | Admitting: Family Medicine

## 2017-02-10 VITALS — BP 96/60 | HR 68 | Ht 73.0 in | Wt 224.2 lb

## 2017-02-10 DIAGNOSIS — N471 Phimosis: Secondary | ICD-10-CM

## 2017-02-10 DIAGNOSIS — D61818 Other pancytopenia: Secondary | ICD-10-CM

## 2017-02-10 DIAGNOSIS — N3281 Overactive bladder: Secondary | ICD-10-CM

## 2017-02-10 LAB — CBC
Hematocrit: 35 % — ABNORMAL LOW (ref 37.5–51.0)
Hemoglobin: 11.1 g/dL — ABNORMAL LOW (ref 13.0–17.7)
MCH: 28.2 pg (ref 26.6–33.0)
MCHC: 31.7 g/dL (ref 31.5–35.7)
MCV: 89 fL (ref 79–97)
Platelets: 86 10*3/uL — CL (ref 150–379)
RBC: 3.94 x10E6/uL — ABNORMAL LOW (ref 4.14–5.80)
RDW: 15.9 % — ABNORMAL HIGH (ref 12.3–15.4)
WBC: 2.5 10*3/uL — CL (ref 3.4–10.8)

## 2017-02-10 LAB — URINALYSIS, COMPLETE
Bilirubin, UA: NEGATIVE
Glucose, UA: NEGATIVE
Ketones, UA: NEGATIVE
Nitrite, UA: NEGATIVE
Protein, UA: NEGATIVE
RBC, UA: NEGATIVE
Specific Gravity, UA: 1.02 (ref 1.005–1.030)
Urobilinogen, Ur: 0.2 mg/dL (ref 0.2–1.0)
pH, UA: 6 (ref 5.0–7.5)

## 2017-02-10 LAB — BLADDER SCAN AMB NON-IMAGING: Scan Result: 32

## 2017-02-10 LAB — TSH: TSH: 0.378 u[IU]/mL — ABNORMAL LOW (ref 0.450–4.500)

## 2017-02-10 MED ORDER — MIRABEGRON ER 50 MG PO TB24: 50.0000 mg | ORAL_TABLET | Freq: Every day | ORAL | 11 refills | Status: DC

## 2017-02-10 NOTE — Progress Notes (Signed)
02/10/2017 9:12 AM   [place] 03/21/1951 [id number]  Referring provider: Adline Potter, MD [address] [place], [place] [address]  Chief Complaint  Patient presents with  . Over Active Bladder    HPI: The patient is a 66 year old gentleman who presents today for evaluation of tightening of the skin edges around his glans penis as well as an overactive bladder.  1. Secondary phimosis The patient complains that for the last few years she has noticed the skin edges around his penis starting to close over the tip of his penis. He describes that it looks like he was never circumcised though he was. He only has a pinpoint opening of the skin edges currently. Urine gets trapped in there and he gets red and irritated. He is increasingly bothered by this. 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  2. Overactive bladder Patient complains of multiyear history of hoarseness the urge with urge incontinence. He also has urinary frequency sometimes up to every 30 minutes. He has nocturia 1. He is unsure if he empties his bladder. He notes a good stream. He does not have to strain and has no intermittency. PVR today is 32. He tried Detrol for 1 month but has since stopped this medication as it did not provide any relief of his symptoms.  PMH: Past Medical History:  Diagnosis Date  . CAD (coronary artery disease)    No Stents Present- per patient  . Colon polyps   . History of alcohol abuse Last Drink- 2011  . Hypertension   . Sleep apnea   . Thyroid disease     Surgical History: Past Surgical History:  Procedure Laterality Date  . BILATERAL TOTAL SHOULDER ARTHROPLASTY  1990's  . COLONOSCOPY WITH PROPOFOL N/A 10/30/2016   Procedure: COLONOSCOPY WITH PROPOFOL;  Surgeon: Lucilla Lame, MD;  Location: [facility] END[facility]COPY;  Service: Endoscopy;  Laterality: N/A;  . ESOPHAGOGASTRODUODENOSCOPY (EGD)  WITH PROPOFOL N/A 10/30/2016   Procedure: ESOPHAGOGASTRODUODENOSCOPY (EGD) WITH PROPOFOL;  Surgeon: Lucilla Lame, MD;  Location: [facility] END[facility];  Service: Endoscopy;  Laterality: N/A;  . GASTRIC BYPASS  2016   Dr. Darnell Level- [place], [place]  . REPLACEMENT TOTAL KNEE BILATERAL  1990's  . TONSILLECTOMY  2004    Home Medications:  Allergies as of 02/10/2017   No Known Allergies     Medication List       Accurate as of 02/10/17  9:12 AM. Always use your most recent med list.          glucosamine-chondroitin 500-400 MG tablet Take 1 tablet by mouth 3 (three) times daily.   lactulose 10 GM/15ML solution Commonly known as:  CHRONULAC Take 15 mLs (10 g total) by mouth 2 (two) times daily as needed for mild constipation.   levothyroxine 175 MCG tablet Commonly known as:  SYNTHROID, LEVOTHROID Take 1 tablet (175 mcg total) by mouth daily before breakfast.   multivitamin capsule Take 1 capsule by mouth daily.   pantoprazole 40 MG tablet Commonly known as:  PROTONIX Take 1 tablet (40 mg total) by mouth daily.   traZODone 150 MG tablet Commonly known as:  DESYREL Take 1 tablet (150 mg total) by mouth at bedtime as needed for sleep.   vitamin B-12 500 MCG tablet Commonly known as:  CYANOCOBALAMIN Take 500 mcg by mouth daily.       Allergies: No Known Allergies  Family History: Family History  Problem Relation  Age of Onset  . Cancer Mother        Breast  . Heart disease Father   . Stroke Father   . AAA (abdominal aortic aneurysm) Father   . Prostate cancer Neg Hx   . Bladder Cancer Neg Hx   . Kidney cancer Neg Hx     Social History:  reports that he has never smoked. He has never used smokeless tobacco. He reports that he does not drink alcohol or use drugs.  ROS: UROLOGY Frequent Urination?: Yes Hard to postpone urination?: Yes Burning/pain with urination?: No Get up at night to urinate?: Yes Leakage of urine?: No Urine stream starts and stops?: Yes Trouble starting  stream?: No Do you have to strain to urinate?: No Blood in urine?: No Urinary tract infection?: No Sexually transmitted disease?: No Injury to kidneys or bladder?: No Painful intercourse?: No Weak stream?: No Erection problems?: No Penile pain?: No  Gastrointestinal Nausea?: No Vomiting?: No Indigestion/heartburn?: No Diarrhea?: No Constipation?: No  Constitutional Fever: No Night sweats?: No Weight loss?: Yes Fatigue?: No  Skin Skin rash/lesions?: No Itching?: No  Eyes Blurred vision?: No Double vision?: No  Ears/Nose/Throat Sore throat?: No Sinus problems?: Yes  Hematologic/Lymphatic Swollen glands?: No Easy bruising?: Yes  Cardiovascular Leg swelling?: No Chest pain?: No  Respiratory Cough?: No Shortness of breath?: No  Endocrine Excessive thirst?: No  Musculoskeletal Back pain?: Yes Joint pain?: No  Neurological Headaches?: No Dizziness?: No  Psychologic Depression?: No Anxiety?: No  Physical Exam: BP 96/60 (BP Location: Left Arm, Patient Position: Sitting, Cuff Size: Normal)   Pulse 68   Ht 6\' 1"  (1.854 m)   Wt 224 lb 3.2 oz (101.7 kg)   BMI 29.58 kg/m   Constitutional:  Alert and oriented, No acute distress. HEENT: [place] AT, moist mucus membranes.  Trachea midline, no masses. Cardiovascular: No clubbing, cyanosis, or edema. Respiratory: Normal respiratory effort, no increased work of breathing. GI: Abdomen is soft, nontender, nondistended, no abdominal masses GU: No CVA tenphallus with evidence of secondary phimosis with only a pinpoint opening of the distal skin edges. Testicles descended and benign bilaterally. DRE: Benign 30 g Skin: No rashes, bruises or suspicious lesions. Lymph: No cervical or inguinal adenopathy. Neurologic: Grossly intact, no focal deficits, moving all 4 extremities. Psychiatric: Normal mood and affect.  Laboratory Data: Lab Results  Component Value Date   WBC 2.5 (LL) 02/09/2017   HGB 11.1 (L) 02/09/2017     HCT 35.0 (L) 02/09/2017   MCV 89 02/09/2017   PLT 86 (LL) 02/09/2017    Lab Results  Component Value Date   CREATININE 1.02 12/12/2016    No results found for: PSA  No results found for: TESTOSTERONE  Lab Results  Component Value Date   HGBA1C 4.7 (L) 12/12/2016    Urinalysis    Component Value Date/Time   COLORURINE YELLOW (A) 10/05/2016 0211   APPEARANCEUR CLEAR (A) 10/05/2016 0211   APPEARANCEUR Clear 07/15/2013 1327   LABSPEC 1.012 10/05/2016 0211   LABSPEC 1.008 07/15/2013 1327   PHURINE 6.0 10/05/2016 0211   GLUCOSEU NEGATIVE 10/05/2016 0211   GLUCOSEU Negative 07/15/2013 1327   HGBUR NEGATIVE 10/05/2016 0211   BILIRUBINUR NEGATIVE 10/05/2016 0211   BILIRUBINUR Negative 07/15/2013 1327   KETONESUR NEGATIVE 10/05/2016 0211   PROTEINUR NEGATIVE 10/05/2016 0211   NITRITE NEGATIVE 10/05/2016 0211   LEUKOCYTESUR TRACE (A) 10/05/2016 0211   LEUKOCYTESUR 2+ 07/15/2013 1327    Assessment & Plan:    1. OAB Patient is artery  failed an anticholinergic. We'll try Myrbetriq 50 mg daily.  2. Secondary phimosis A discussed with the patient the only treatment for his secondary phimosis is revision of his circumcision. We discussed the risks, benefits, and indications of this procedure. I didn't explain the chance for penile skin shortening with revision of his circumcision. Also discussed iatrogenic injury, bleeding, infection, and poor cosmesis/wound healing. All questions were answered. The patient has elected to proceed.  Nickie Retort, MD  [facility] [address] [place], [place] [address] [phone]

## 2017-02-10 NOTE — Telephone Encounter (Signed)
Pt scheduled for revision of circumcision with Dr Pilar Jarvis on 03/06/17. Made pt aware of surgery date, pre-admit testing appt & to call day prior to surgery for arrival time to [facility]. Questions answered. Pt voices understanding.

## 2017-02-16 ENCOUNTER — Encounter: Payer: Self-pay | Admitting: Family Medicine

## 2017-02-16 ENCOUNTER — Other Ambulatory Visit: Payer: Self-pay | Admitting: Family Medicine

## 2017-02-17 ENCOUNTER — Other Ambulatory Visit: Payer: BLUE CROSS/BLUE SHIELD

## 2017-02-19 ENCOUNTER — Encounter: Payer: Self-pay | Admitting: Family Medicine

## 2017-02-19 ENCOUNTER — Ambulatory Visit (INDEPENDENT_AMBULATORY_CARE_PROVIDER_SITE_OTHER): Payer: BLUE CROSS/BLUE SHIELD | Admitting: Family Medicine

## 2017-02-19 VITALS — BP 113/76 | HR 69 | Temp 98.3°F | Resp 16 | Ht 73.0 in | Wt 224.0 lb

## 2017-02-19 DIAGNOSIS — R1013 Epigastric pain: Secondary | ICD-10-CM | POA: Diagnosis not present

## 2017-02-19 DIAGNOSIS — D61818 Other pancytopenia: Secondary | ICD-10-CM | POA: Diagnosis not present

## 2017-02-19 DIAGNOSIS — M170 Bilateral primary osteoarthritis of knee: Secondary | ICD-10-CM | POA: Diagnosis not present

## 2017-02-19 DIAGNOSIS — E538 Deficiency of other specified B group vitamins: Secondary | ICD-10-CM | POA: Diagnosis not present

## 2017-02-19 DIAGNOSIS — E039 Hypothyroidism, unspecified: Secondary | ICD-10-CM | POA: Diagnosis not present

## 2017-02-19 DIAGNOSIS — G4700 Insomnia, unspecified: Secondary | ICD-10-CM

## 2017-02-19 MED ORDER — GABAPENTIN 300 MG PO CAPS: 300.0000 mg | ORAL_CAPSULE | Freq: Every day | ORAL | 2 refills | Status: DC

## 2017-02-19 MED ORDER — PANTOPRAZOLE SODIUM 40 MG PO TBEC: 40.0000 mg | DELAYED_RELEASE_TABLET | Freq: Two times a day (BID) | ORAL | 2 refills | Status: DC

## 2017-02-19 MED ORDER — PANTOPRAZOLE SODIUM 40 MG PO TBEC: 40.0000 mg | DELAYED_RELEASE_TABLET | Freq: Two times a day (BID) | ORAL | 0 refills | Status: DC

## 2017-02-19 NOTE — Progress Notes (Signed)
Date:  02/19/2017   Name:  Dylan Ho   DOB:  October 30, 1950   MRN:  [id number]  PCP:  Adline Potter, MD    Chief Complaint: Insomnia (Taking 2-3 Trazadone at night and still unable to fall asleep. Averages 8 hours once falling asleep. ) and GI Problem (pain in upper middle quad of stomach. Feels it may be another bleeding ulcer not sure. This started x 1 week ago. Denies NV but having some loose stools. Not eating well havinfg LOA. )   History of Present Illness:  This is a 66 y.o. male seen for 10 day f/u. C/o epigastric pain similar to recent gastric ulcer with GIB, taking Protonix daily, off Carafate, no hematemesis. Also c/o insomnia uncontrolled on trazodone 300 mg qhs. Last CBC showed new pancytopenia, has hematology appt next week.Urology changed to [facility] which has not helped and has circumcision revision scheduled next month. Hx sleep apnea on CPAP but stopped snoring with weight loss s/p bariatric surgery.  Review of Systems:  Review of Systems  Constitutional: Positive for fatigue. Negative for chills and fever.  Respiratory: Negative for cough and shortness of breath.   Cardiovascular: Negative for chest pain and leg swelling.  Gastrointestinal: Negative for blood in stool, constipation and vomiting.       Some loose stools  Genitourinary: Negative for difficulty urinating.  Neurological: Negative for syncope and light-headedness.    Patient Active Problem List   Diagnosis Date Noted  . Anemia due to blood loss 01/09/2017  . B12 deficiency 01/09/2017  . 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 Osteoarthritis of both knees 01/09/2017  . Insomnia 12/12/2016  . Hx of gastric bypass 12/12/2016  . Cirrhosis of liver ([place]) 11/19/2016  . Dyspnea 11/19/2016  . Positive Helicobacter pylori serology 11/19/2016  . Hypercholesterolemia 11/19/2016  . Right upper quadrant pain 11/19/2016  . Sleep apnea 11/19/2016  . Von Willebrand disease ([place]) 11/19/2016  . Hematochezia   .  History of upper gastrointestinal bleeding   . Hematemesis without nausea   . Gastrointestinal ulcer   . GI bleed 10/29/2016  . Obesity (BMI 30.0-34.9) 01/19/2014  . Nondependent alcohol abuse 01/11/2010  . Hypothyroidism 01/11/2010  . Hyperlipidemia 01/11/2010  . Acute and subacute liver necrosis 01/11/2010  . Coronary atherosclerosis of native coronary artery 09/22/2008  . Essential hypertension 08/24/2008    Prior to Admission medications   Medication Sig Start Date End Date Taking? Authorizing Provider  glucosamine-chondroitin 500-400 MG tablet Take 1 tablet by mouth 3 (three) times daily.   Yes [provider]  lactulose (CHRONULAC) 10 GM/15ML solution Take 15 mLs (10 g total) by mouth 2 (two) times daily as needed for mild constipation. 02/09/17  Yes Kamille Toomey, Gwyndolyn Saxon, MD  levothyroxine (SYNTHROID, LEVOTHROID) 175 MCG tablet Take 1 tablet (175 mcg total) by mouth daily before breakfast. 12/16/16  Yes Katyana Trolinger, Gwyndolyn Saxon, MD  mirabegron ER (MYRBETRIQ) 50 MG TB24 tablet Take 1 tablet (50 mg total) by mouth daily. 02/10/17  Yes Nickie Retort, MD  Multiple Vitamin (MULTIVITAMIN) capsule Take 1 capsule by mouth daily.   Yes [provider]  pantoprazole (PROTONIX) 40 MG tablet Take 1 tablet (40 mg total) by mouth 2 (two) times daily. 02/19/17  Yes Jeniyah Menor, Gwyndolyn Saxon, MD  vitamin B-12 (CYANOCOBALAMIN) 500 MCG tablet Take 500 mcg by mouth daily.   Yes [provider]  gabapentin (NEURONTIN) 300 MG capsule Take 1 capsule (300 mg total) by mouth at bedtime. 02/19/17   Adline Potter, MD    No  Known Allergies  Past Surgical History:  Procedure Laterality Date  . BILATERAL TOTAL SHOULDER ARTHROPLASTY  1990's  . COLONOSCOPY WITH PROPOFOL N/A 10/30/2016   Procedure: COLONOSCOPY WITH PROPOFOL;  Surgeon: Lucilla Lame, MD;  Location: [facility] ENDOSCOPY;  Service: Endoscopy;  Laterality: N/A;  . ESOPHAGOGASTRODUODENOSCOPY (EGD) WITH PROPOFOL N/A 10/30/2016   Procedure:  ESOPHAGOGASTRODUODENOSCOPY (EGD) WITH PROPOFOL;  Surgeon: Lucilla Lame, MD;  Location: [facility] ENDOSCOPY;  Service: Endoscopy;  Laterality: N/A;  . GASTRIC BYPASS  2016   Dr. Darnell Level- [place], [place]  . REPLACEMENT TOTAL KNEE BILATERAL  1990's  . TONSILLECTOMY  2004    Social History  Substance Use Topics  . Smoking status: Never Smoker  . Smokeless tobacco: Never Used  . Alcohol use No     Comment: Heavy ETOH in past- Last Drink 2011 per patient    Family History  Problem Relation Age of Onset  . Cancer Mother        Breast  . Heart disease Father   . Stroke Father   . AAA (abdominal aortic aneurysm) Father   . Prostate cancer Neg Hx   . Bladder Cancer Neg Hx   . Kidney cancer Neg Hx     Medication list has been reviewed and updated.  Physical Examination: BP 113/76   Pulse 69   Temp 98.3 F (36.8 C)   Resp 16   Ht 6\' 1"  (1.854 m)   Wt 224 lb (101.6 kg)   SpO2 100%   BMI 29.55 kg/m   Physical Exam  Constitutional: He appears well-developed and well-nourished.  Cardiovascular: Normal rate, regular rhythm and normal heart sounds.   Pulmonary/Chest: Effort normal and breath sounds normal.  Abdominal: Soft. He exhibits no distension and no mass. There is no guarding.  Mod epigastric tenderness  Musculoskeletal: He exhibits no edema.  Neurological: He is alert.  Skin: Skin is warm and dry.  Psychiatric: He has a normal mood and affect. His behavior is normal.  Nursing note and vitals reviewed.   Assessment and Plan:  1. Epigastric pain Possible recurrence anastamosis ulcer, increase Protonix to bid, add Carafate if sxs persist, consider GI consult (saw Dr. Allen Norris in hospital) - Comprehensive Metabolic Panel (CMET) - CBC  2. Pancytopenia (HCC) Trazodone could be contributing, repeat CBC today, for heme eval next week  3. Hypothyroidism, unspecified type On increased Synthroid - TSH  4. Primary osteoarthritis of both knees Improved s/p cortisone injection  5.  Insomnia, unspecified type Stop trazodone, begin gabapentin qhs, may take up to three capsules nightly  6. B12 deficiency Continue supplement  Return if symptoms worsen or fail to improve.  Satira Anis. [facility]  02/19/2017

## 2017-02-19 NOTE — Patient Instructions (Signed)
Increase pantoprazole to twice daily. Stop trazodone, begin gabapentin daily at bedtime. Keep hematology appointment next week.

## 2017-02-20 ENCOUNTER — Other Ambulatory Visit: Payer: Self-pay | Admitting: Family Medicine

## 2017-02-20 ENCOUNTER — Other Ambulatory Visit: Payer: BLUE CROSS/BLUE SHIELD

## 2017-02-20 LAB — COMPREHENSIVE METABOLIC PANEL
ALT: 21 IU/L (ref 0–44)
AST: 31 IU/L (ref 0–40)
Albumin/Globulin Ratio: 2.5 — ABNORMAL HIGH (ref 1.2–2.2)
Albumin: 4.3 g/dL (ref 3.6–4.8)
Alkaline Phosphatase: 83 IU/L (ref 39–117)
BUN/Creatinine Ratio: 12 (ref 10–24)
BUN: 10 mg/dL (ref 8–27)
Bilirubin Total: 0.4 mg/dL (ref 0.0–1.2)
CO2: 24 mmol/L (ref 20–29)
Calcium: 8.8 mg/dL (ref 8.6–10.2)
Chloride: 106 mmol/L (ref 96–106)
Creatinine, Ser: 0.84 mg/dL (ref 0.76–1.27)
GFR calc Af Amer: 107 mL/min/{1.73_m2} (ref >59)
GFR calc non Af Amer: 93 mL/min/{1.73_m2} (ref >59)
Globulin, Total: 1.7 g/dL (ref 1.5–4.5)
Glucose: 86 mg/dL (ref 65–99)
Potassium: 4.5 mmol/L (ref 3.5–5.2)
Sodium: 144 mmol/L (ref 134–144)
Total Protein: 6 g/dL (ref 6.0–8.5)

## 2017-02-20 LAB — CBC
Hematocrit: 35.8 % — ABNORMAL LOW (ref 37.5–51.0)
Hemoglobin: 11.1 g/dL — ABNORMAL LOW (ref 13.0–17.7)
MCH: 27.7 pg (ref 26.6–33.0)
MCHC: 31 g/dL — ABNORMAL LOW (ref 31.5–35.7)
MCV: 89 fL (ref 79–97)
Platelets: 99 10*3/uL — CL (ref 150–379)
RBC: 4.01 x10E6/uL — ABNORMAL LOW (ref 4.14–5.80)
RDW: 15.5 % — ABNORMAL HIGH (ref 12.3–15.4)
WBC: 2.9 10*3/uL — ABNORMAL LOW (ref 3.4–10.8)

## 2017-02-20 LAB — TSH: TSH: 0.188 u[IU]/mL — ABNORMAL LOW (ref 0.450–4.500)

## 2017-02-20 MED ORDER — LEVOTHYROXINE SODIUM 150 MCG PO TABS: 150.0000 ug | ORAL_TABLET | Freq: Every day | ORAL | 2 refills | Status: DC

## 2017-02-23 ENCOUNTER — Encounter: Payer: Self-pay | Admitting: Family Medicine

## 2017-02-23 NOTE — Telephone Encounter (Signed)
MyChart message

## 2017-02-24 ENCOUNTER — Encounter: Payer: Self-pay | Admitting: Family Medicine

## 2017-02-24 NOTE — Telephone Encounter (Signed)
Patient MyChart Msg.

## 2017-02-25 ENCOUNTER — Other Ambulatory Visit: Payer: Self-pay

## 2017-02-25 DIAGNOSIS — D61818 Other pancytopenia: Secondary | ICD-10-CM

## 2017-02-25 HISTORY — DX: Other pancytopenia: D61.818

## 2017-02-25 MED ORDER — LACTULOSE 10 GM/15ML PO SOLN: 10.0000 g | Freq: Two times a day (BID) | ORAL | 2 refills | Status: DC | PRN

## 2017-02-25 MED ORDER — PANTOPRAZOLE SODIUM 40 MG PO TBEC: 40.0000 mg | DELAYED_RELEASE_TABLET | Freq: Two times a day (BID) | ORAL | 2 refills | Status: DC

## 2017-02-26 ENCOUNTER — Encounter: Payer: Self-pay | Admitting: Oncology

## 2017-02-26 ENCOUNTER — Inpatient Hospital Stay: Payer: BLUE CROSS/BLUE SHIELD | Attending: Oncology | Admitting: Oncology

## 2017-02-26 ENCOUNTER — Inpatient Hospital Stay: Payer: BLUE CROSS/BLUE SHIELD

## 2017-02-26 VITALS — BP 104/72 | HR 60 | Temp 97.3°F | Ht 69.5 in | Wt 222.1 lb

## 2017-02-26 DIAGNOSIS — R1031 Right lower quadrant pain: Secondary | ICD-10-CM | POA: Insufficient documentation

## 2017-02-26 DIAGNOSIS — G4730 Sleep apnea, unspecified: Secondary | ICD-10-CM

## 2017-02-26 DIAGNOSIS — K7469 Other cirrhosis of liver: Secondary | ICD-10-CM | POA: Diagnosis not present

## 2017-02-26 DIAGNOSIS — D696 Thrombocytopenia, unspecified: Secondary | ICD-10-CM

## 2017-02-26 DIAGNOSIS — E079 Disorder of thyroid, unspecified: Secondary | ICD-10-CM | POA: Insufficient documentation

## 2017-02-26 DIAGNOSIS — K648 Other hemorrhoids: Secondary | ICD-10-CM | POA: Insufficient documentation

## 2017-02-26 DIAGNOSIS — R161 Splenomegaly, not elsewhere classified: Secondary | ICD-10-CM

## 2017-02-26 DIAGNOSIS — I10 Essential (primary) hypertension: Secondary | ICD-10-CM | POA: Insufficient documentation

## 2017-02-26 DIAGNOSIS — R5383 Other fatigue: Secondary | ICD-10-CM | POA: Insufficient documentation

## 2017-02-26 DIAGNOSIS — D72819 Decreased white blood cell count, unspecified: Secondary | ICD-10-CM

## 2017-02-26 DIAGNOSIS — E538 Deficiency of other specified B group vitamins: Secondary | ICD-10-CM

## 2017-02-26 DIAGNOSIS — E039 Hypothyroidism, unspecified: Secondary | ICD-10-CM | POA: Insufficient documentation

## 2017-02-26 DIAGNOSIS — R109 Unspecified abdominal pain: Secondary | ICD-10-CM | POA: Insufficient documentation

## 2017-02-26 DIAGNOSIS — K7030 Alcoholic cirrhosis of liver without ascites: Secondary | ICD-10-CM | POA: Diagnosis not present

## 2017-02-26 DIAGNOSIS — Z9884 Bariatric surgery status: Secondary | ICD-10-CM | POA: Diagnosis not present

## 2017-02-26 DIAGNOSIS — G8929 Other chronic pain: Secondary | ICD-10-CM | POA: Diagnosis not present

## 2017-02-26 DIAGNOSIS — R1013 Epigastric pain: Secondary | ICD-10-CM | POA: Diagnosis not present

## 2017-02-26 DIAGNOSIS — Z79899 Other long term (current) drug therapy: Secondary | ICD-10-CM

## 2017-02-26 DIAGNOSIS — D500 Iron deficiency anemia secondary to blood loss (chronic): Secondary | ICD-10-CM

## 2017-02-26 DIAGNOSIS — D649 Anemia, unspecified: Secondary | ICD-10-CM

## 2017-02-26 DIAGNOSIS — K6389 Other specified diseases of intestine: Secondary | ICD-10-CM | POA: Insufficient documentation

## 2017-02-26 DIAGNOSIS — K219 Gastro-esophageal reflux disease without esophagitis: Secondary | ICD-10-CM | POA: Insufficient documentation

## 2017-02-26 DIAGNOSIS — Z803 Family history of malignant neoplasm of breast: Secondary | ICD-10-CM | POA: Diagnosis not present

## 2017-02-26 DIAGNOSIS — I2510 Atherosclerotic heart disease of native coronary artery without angina pectoris: Secondary | ICD-10-CM | POA: Insufficient documentation

## 2017-02-26 DIAGNOSIS — D61818 Other pancytopenia: Secondary | ICD-10-CM | POA: Diagnosis present

## 2017-02-26 DIAGNOSIS — Z86010 Personal history of colonic polyps: Secondary | ICD-10-CM | POA: Insufficient documentation

## 2017-02-26 LAB — CBC WITH DIFFERENTIAL/PLATELET
Basophils Absolute: 0 10*3/uL (ref 0–0.1)
Basophils Relative: 1 %
Eosinophils Absolute: 0.1 10*3/uL (ref 0–0.7)
Eosinophils Relative: 2 %
HCT: 36 % — ABNORMAL LOW (ref 40.0–52.0)
Hemoglobin: 11.7 g/dL — ABNORMAL LOW (ref 13.0–18.0)
Lymphocytes Relative: 22 %
Lymphs Abs: 0.8 10*3/uL — ABNORMAL LOW (ref 1.0–3.6)
MCH: 28 pg (ref 26.0–34.0)
MCHC: 32.6 g/dL (ref 32.0–36.0)
MCV: 85.9 fL (ref 80.0–100.0)
Monocytes Absolute: 0.3 10*3/uL (ref 0.2–1.0)
Monocytes Relative: 10 %
Neutro Abs: 2.3 10*3/uL (ref 1.4–6.5)
Neutrophils Relative %: 65 %
Platelets: 120 10*3/uL — ABNORMAL LOW (ref 150–440)
RBC: 4.19 MIL/uL — ABNORMAL LOW (ref 4.40–5.90)
RDW: 16.7 % — ABNORMAL HIGH (ref 11.5–14.5)
WBC: 3.5 10*3/uL — ABNORMAL LOW (ref 3.8–10.6)

## 2017-02-26 LAB — COMPREHENSIVE METABOLIC PANEL
ALT: 24 U/L (ref 17–63)
AST: 34 U/L (ref 15–41)
Albumin: 4.4 g/dL (ref 3.5–5.0)
Alkaline Phosphatase: 81 U/L (ref 38–126)
Anion gap: 3 — ABNORMAL LOW (ref 5–15)
BUN: 12 mg/dL (ref 6–20)
CO2: 32 mmol/L (ref 22–32)
Calcium: 9.1 mg/dL (ref 8.9–10.3)
Chloride: 105 mmol/L (ref 101–111)
Creatinine, Ser: 0.83 mg/dL (ref 0.61–1.24)
GFR calc Af Amer: >60 mL/min (ref >60)
GFR calc non Af Amer: >60 mL/min (ref >60)
Glucose, Bld: 93 mg/dL (ref 65–99)
Potassium: 4.1 mmol/L (ref 3.5–5.1)
Sodium: 140 mmol/L (ref 135–145)
Total Bilirubin: 0.9 mg/dL (ref 0.3–1.2)
Total Protein: 6.9 g/dL (ref 6.5–8.1)

## 2017-02-26 LAB — FOLATE: Folate: 30 ng/mL (ref >5.9)

## 2017-02-26 LAB — IRON AND TIBC
Iron: 24 ug/dL — ABNORMAL LOW (ref 45–182)
Saturation Ratios: 6 % — ABNORMAL LOW (ref 17.9–39.5)
TIBC: 427 ug/dL (ref 250–450)
UIBC: 403 ug/dL

## 2017-02-26 LAB — PATHOLOGIST SMEAR REVIEW

## 2017-02-26 LAB — FERRITIN: Ferritin: 4 ng/mL — ABNORMAL LOW (ref 24–336)

## 2017-02-26 NOTE — Progress Notes (Addendum)
* **  Hematology/Oncology Consult note [facility] Telephone:([phone] [phone]  CONSULT NOTE Patient Care Team: Adline Potter, MD as PCP - General (Family Medicine)  CHIEF COMPLAINTS/PURPOSE OF CONSULTATION:  I have low blood counts.   HISTORY OF PRESENTING ILLNESS:  Dylan Ho 66 y.o.  male with past medical history listed as below was referred by Dr. Vicente Masson to me for evaluation and management of patient's pancytopenia.   Patient denies any unintentional weight loss, fever/chills, weakness.Patient has a history of gastric bypass. Patient was hospitalized this April due to hematemesis. EGD showed anastomosis ulcer. Patient started on PPI.  Recently patient feels more stomach discomfort. He has not followed up with GI yet since April.   ROS:  Review of Systems  Constitutional: Positive for fatigue.  HENT:  Negative.   Eyes: Negative.   Respiratory: Negative.   Cardiovascular: Negative.   Gastrointestinal: Positive for abdominal pain.  Endocrine: Negative.   Genitourinary: Negative.    Musculoskeletal: Negative.   Skin: Negative.   Neurological: Negative.   Hematological: Negative.   Psychiatric/Behavioral: Negative.     MEDICAL HISTORY:  Past Medical History:  Diagnosis Date  . CAD (coronary artery disease)    No Stents Present- per patient  . Colon polyps   . History of alcohol abuse Last Drink- 2011  . Hypertension   . Sleep apnea   . Thyroid disease     SURGICAL HISTORY: Past Surgical History:  Procedure Laterality Date  . BILATERAL TOTAL SHOULDER ARTHROPLASTY  1990's  . COLONOSCOPY WITH PROPOFOL N/A 10/30/2016   Procedure: COLONOSCOPY WITH PROPOFOL;  Surgeon: Lucilla Lame, MD;  Location: [facility] ENDOSCOPY;  Service: Endoscopy;  Laterality: N/A;  . ESOPHAGOGASTRODUODENOSCOPY (EGD) WITH PROPOFOL N/A 10/30/2016   Procedure: ESOPHAGOGASTRODUODENOSCOPY (EGD) WITH PROPOFOL;  Surgeon: Lucilla Lame, MD;  Location: [facility] ENDOSCOPY;   Service: Endoscopy;  Laterality: N/A;  . GASTRIC BYPASS  2016   Dr. Darnell Level- [place], [place]  . REPLACEMENT TOTAL KNEE BILATERAL  1990's  . TONSILLECTOMY  2004    SOCIAL HISTORY: Social History   Social History  . Marital status: Married    Spouse name: N/A  . Number of children: N/A  . Years of education: N/A   Occupational History  . Not on file.   Social History Main Topics  . Smoking status: Never Smoker  . Smokeless tobacco: Never Used  . Alcohol use No     Comment: Heavy ETOH in past- Last Drink 2011 per patient  . Drug use: No  . Sexual activity: Not on file   Other Topics Concern  . Not on file   Social History Narrative  . No narrative on file    FAMILY HISTORY: Family History  Problem Relation Age of Onset  . Cancer Mother        Breast  . Heart disease Father   . Stroke Father   . AAA (abdominal aortic aneurysm) Father   . Prostate cancer Neg Hx   . Bladder Cancer Neg Hx   . Kidney cancer Neg Hx     ALLERGIES:  has No Known Allergies.  MEDICATIONS:  Current Outpatient Prescriptions  Medication Sig Dispense Refill  . gabapentin (NEURONTIN) 300 MG capsule Take 1 capsule (300 mg total) by mouth at bedtime. 30 capsule 2  . glucosamine-chondroitin 500-400 MG tablet Take 1 tablet by mouth 3 (three) times daily.    [name] Kitchen lactulose (CHRONULAC) 10 GM/15ML solution Take 15 mLs (10 g total) by mouth 2 (two) times daily  as needed for mild constipation. 950 mL 2  . levothyroxine (SYNTHROID, LEVOTHROID) 150 MCG tablet Take 1 tablet (150 mcg total) by mouth daily before breakfast. 30 tablet 2  . mirabegron ER (MYRBETRIQ) 50 MG TB24 tablet Take 1 tablet (50 mg total) by mouth daily. 30 tablet 11  . Multiple Vitamin (MULTIVITAMIN) capsule Take 1 capsule by mouth daily.    . pantoprazole (PROTONIX) 40 MG tablet Take 1 tablet (40 mg total) by mouth 2 (two) times daily. 60 tablet 2  . vitamin B-12 (CYANOCOBALAMIN) 500 MCG tablet Take 500 mcg by mouth daily.     No current  facility-administered medications for this visit.       [name] Kitchen  PHYSICAL EXAMINATION: ECOG PERFORMANCE STATUS: 0 - Asymptomatic Vitals:   02/26/17 1030  BP: 104/72  Pulse: 60  Temp: (!) 97.3 F (36.3 C)   Filed Weights   02/26/17 1030  Weight: 222 lb 1.8 oz (100.8 kg)    GENERAL:  Alert, no distress and comfortable.  EYES: no pallor or icterus OROPHARYNX: no thrush or ulceration; good dentition  NECK: supple, no masses felt LYMPH:  no palpable lymphadenopathy in the cervical, axillary or inguinal regions LUNGS: clear to auscultation and  No wheeze or crackles HEART/CVS: regular rate & rhythm and no murmurs; No lower extremity edema ABDOMEN: abdomen soft, epigastric tender and normal bowel sounds Musculoskeletal:no cyanosis of digits and no clubbing  PSYCH: alert & oriented x 3  NEURO: no focal motor/sensory deficits SKIN:  no rashes or significant lesions  LABORATORY DATA:  I have reviewed the data as listed Lab Results  Component Value Date   WBC 2.9 (L) 02/19/2017   HGB 11.1 (L) 02/19/2017   HCT 35.8 (L) 02/19/2017   MCV 89 02/19/2017   PLT 99 (LL) 02/19/2017    Recent Labs  10/29/16 1127  10/31/16 0420 12/12/16 1041 02/19/17 1111  NA 140  < > 143 142 144  K 3.2*  < > 3.8 4.9 4.5  CL 110  < > 112* 109* 106  CO2 19*  < > 26 20 24   GLUCOSE 161*  < > 134* 84 86  BUN 52*  < > 13 23 10   CREATININE 1.23  < > 0.78 1.02 0.84  CALCIUM 8.2*  < > 8.5* 9.0 8.8  GFRNONAA >60  < > >60 77 93  GFRAA >60  < > >60 89 107  PROT 5.1*  --   --  6.2 6.0  ALBUMIN 3.2*  --   --  4.4 4.3  AST 28  --   --  26 31  ALT 15*  --   --  15 21  ALKPHOS 44  --   --  70 83  BILITOT 0.7  --   --  0.4 0.4  < > = values in this interval not displayed. Results for KELAN, PRITT (MRN [id number]) as of 02/26/2017 11:12  Ref. Range 12/12/2016 10:41 01/09/2017 10:36 02/09/2017 10:35 02/19/2017 11:11  TSH Latest Ref Range: 0.450 - 4.500 uIU/mL 25.950 (H) 0.195 (L) 0.378 (L) 0.188 (L)   RADIOGRAPHIC  STUDIES: I have personally reviewed the radiological images as listed and agreed with the findings in the report. No results found.  CT 10/05/2016 IMPRESSION: 1. Increased attenuation in the small bowel mesentery, which is new since the prior CT. This has an appearance consistent with mesenteric panniculitis. 2. No other acute abnormalities or changes from the prior study. 3. Cirrhosis with borderline splenomegaly. 4. Small gallstone.  No acute cholecystitis. 5. Stable changes from previous gastric bypass surgery.  10/30/2016 colonoscopy and EGD showed Normal esophagus. - Gastric bypass with a normal-sized pouch and intact staple line. Gastrojejunal anastomosis characterized by ulceration. - Normal examined jejunum. The perianal and digital rectal examinations were normal. Bleeding internal hemorrhoids were found. The hemorrhoids were Grade II  ASSESSMENT & PLAN:  1. Other pancytopenia ([place])   2. Other cirrhosis of liver ([place])   3. B12 deficiency   4. H/O gastric bypass   5. Normocytic anemia   6. Thrombocytopenia (HCC)   7. Leukopenia, unspecified type   8. Iron deficiency anemia due to chronic blood loss    Lab results were discussed with patient detail. He has chronic pancytopenia. # Leukopenia and thrombocytopenia likely are secondary to chronic liver disease/cirrhosis/hypersplenism.   Check hepatitis panel, HIV. Ultrasound liver and spleen. # Normocytic anemia, check stool occult, iron TIBC ferratin, ANA, smear review. Folate, occult stool 3 Last B12 level was normal. # Follow up with Dr.Wohl for repeat EGD.  all questions were answered. The patient knows to call the clinic with any problems questions or concerns.  Return of visit:  1 week to discuss lab results. Thank you for this kind referral and the opportunity to participate in the care of this patient. A copy of today's note is routed to referring provider   Addendum: iron panel reviewed. Severe iron deficiency.  Plan IV iron with Venofer weekly x 4.   Earlie Server, MD, PhD Hematology Oncology [facility] Pager- [phone] 02/26/2017

## 2017-02-27 ENCOUNTER — Encounter: Payer: Self-pay | Admitting: Oncology

## 2017-02-27 ENCOUNTER — Encounter
Admission: RE | Admit: 2017-02-27 | Discharge: 2017-02-27 | Disposition: A | Discharge status: Home / Self Care | Payer: BLUE CROSS/BLUE SHIELD | Source: Ambulatory Visit | Attending: Urology | Admitting: Urology

## 2017-02-27 DIAGNOSIS — E538 Deficiency of other specified B group vitamins: Secondary | ICD-10-CM | POA: Insufficient documentation

## 2017-02-27 DIAGNOSIS — D696 Thrombocytopenia, unspecified: Secondary | ICD-10-CM | POA: Insufficient documentation

## 2017-02-27 DIAGNOSIS — D72819 Decreased white blood cell count, unspecified: Secondary | ICD-10-CM | POA: Insufficient documentation

## 2017-02-27 DIAGNOSIS — K7469 Other cirrhosis of liver: Secondary | ICD-10-CM | POA: Insufficient documentation

## 2017-02-27 DIAGNOSIS — Z823 Family history of stroke: Secondary | ICD-10-CM | POA: Insufficient documentation

## 2017-02-27 DIAGNOSIS — Z9889 Other specified postprocedural states: Secondary | ICD-10-CM | POA: Insufficient documentation

## 2017-02-27 DIAGNOSIS — D61818 Other pancytopenia: Secondary | ICD-10-CM | POA: Diagnosis not present

## 2017-02-27 DIAGNOSIS — Z01812 Encounter for preprocedural laboratory examination: Secondary | ICD-10-CM | POA: Insufficient documentation

## 2017-02-27 DIAGNOSIS — Z79899 Other long term (current) drug therapy: Secondary | ICD-10-CM | POA: Diagnosis not present

## 2017-02-27 DIAGNOSIS — D649 Anemia, unspecified: Secondary | ICD-10-CM | POA: Diagnosis not present

## 2017-02-27 DIAGNOSIS — Z8249 Family history of ischemic heart disease and other diseases of the circulatory system: Secondary | ICD-10-CM | POA: Insufficient documentation

## 2017-02-27 DIAGNOSIS — D500 Iron deficiency anemia secondary to blood loss (chronic): Secondary | ICD-10-CM

## 2017-02-27 DIAGNOSIS — I2510 Atherosclerotic heart disease of native coronary artery without angina pectoris: Secondary | ICD-10-CM | POA: Insufficient documentation

## 2017-02-27 DIAGNOSIS — I10 Essential (primary) hypertension: Secondary | ICD-10-CM | POA: Diagnosis not present

## 2017-02-27 HISTORY — DX: Overactive bladder: N32.81

## 2017-02-27 HISTORY — DX: Hypothyroidism, unspecified: E03.9

## 2017-02-27 HISTORY — DX: Anemia, unspecified: D64.9

## 2017-02-27 HISTORY — DX: Personal history of other diseases of the musculoskeletal system and connective tissue: Z87.39

## 2017-02-27 HISTORY — DX: Unspecified osteoarthritis, unspecified site: M19.90

## 2017-02-27 HISTORY — DX: Gastro-esophageal reflux disease without esophagitis: K21.9

## 2017-02-27 HISTORY — DX: Iron deficiency anemia secondary to blood loss (chronic): D50.0

## 2017-02-27 LAB — ANTINUCLEAR ANTIBODIES, IFA: ANA Ab, IFA: NEGATIVE

## 2017-02-27 LAB — HIV ANTIBODY (ROUTINE TESTING W REFLEX): HIV Screen 4th Generation wRfx: NONREACTIVE

## 2017-02-27 LAB — HEPATITIS PANEL, ACUTE
HCV Ab: <0.1 s/co ratio (ref 0.0–0.9)
Hep A IgM: NEGATIVE
Hep B C IgM: NEGATIVE
Hepatitis B Surface Ag: NEGATIVE

## 2017-02-27 NOTE — Patient Instructions (Signed)
  Your procedure is scheduled on: March 13, 2017(FRIDAY ) Report to Same Day Surgery 2nd floor medical mall ([address]-take elevator on left to 2nd floor.  Check in with surgery information desk.) To find out your arrival time please call [phone] between 1PM - 3PM on March 12, 2017 (THURSDAY )  Remember: Instructions that are not followed completely may result in serious medical risk, up to and including death, or upon the discretion of your surgeon and anesthesiologist your surgery may need to be rescheduled.    _x___ 1. Do not eat food or drink liquids after midnight. No gum chewing or  hard candies                                __x__ 2. No Alcohol for 24 hours before or after surgery.   __x__3. No Smoking for 24 prior to surgery.   ____  4. Bring all medications with you on the day of surgery if instructed.    __x__ 5. Notify your doctor if there is any change in your medical condition     (cold, fever, infections).     Do not wear jewelry, make-up, hairpins, clips or nail polish.  Do not wear lotions, powders, or perfumes. You may wear deodorant.  Do not shave 48 hours prior to surgery. Men may shave face and neck.  Do not bring valuables to the hospital.    [facility] is not responsible for any belongings or valuables.               Contacts, dentures or bridgework may not be worn into surgery.  Leave your suitcase in the car. After surgery it may be brought to your room.  For patients admitted to the hospital, discharge time is determined by your  treatment team                      Patients discharged the day of surgery will not be allowed to drive home.  You will need someone to drive you home and stay with you the night of your procedure.    Please read over the following fact sheets that you were given:   [facility] Preparing for Surgery and or MRSA Information   TAKE THE FOLLOWING MEDICATIONS THE MORNING OF SURGERY WITH A SIP OF WATER  1.  LEVOTHYROXINE  2. PANTOPRAZOLE  3.  4.  5.  6.  ____Fleets enema or Magnesium Citrate as directed.   __ Use CHG Soap or sage wipes as directed on instruction sheet   ____ Use inhalers on the day of surgery and bring to hospital day of surgery  ____ Stop Metformin and Janumet 2 days prior to surgery.    ____ Take 1/2 of usual insulin dose the night before surgery and none on the morning surgery       _x___ Follow recommendations from Cardiologist, Pulmonologist or PCP regarding          stopping Aspirin, Coumadin, Pllavix ,Eliquis, Effient, or Pradaxa, and Pletal.  X____Stop Anti-inflammatories such as Advil, Aleve, Ibuprofen, Motrin, Naproxen, Naprosyn, Goodies powders or aspirin products. OK to take Tylenol    _x___ Stop supplements until after surgery.  But may continue Vitamin D, Vitamin B, and multivitamin       ____ Bring C-Pap to the hospital.

## 2017-03-02 ENCOUNTER — Encounter: Payer: Self-pay | Admitting: *Deleted

## 2017-03-03 ENCOUNTER — Ambulatory Visit
Admission: RE | Admit: 2017-03-03 | Discharge: 2017-03-03 | Disposition: A | Discharge status: Home / Self Care | Payer: BLUE CROSS/BLUE SHIELD | Source: Ambulatory Visit | Attending: Oncology | Admitting: Oncology

## 2017-03-03 ENCOUNTER — Telehealth: Payer: Self-pay | Admitting: *Deleted

## 2017-03-03 DIAGNOSIS — D61818 Other pancytopenia: Secondary | ICD-10-CM | POA: Diagnosis present

## 2017-03-03 DIAGNOSIS — K7469 Other cirrhosis of liver: Secondary | ICD-10-CM | POA: Diagnosis not present

## 2017-03-03 DIAGNOSIS — N281 Cyst of kidney, acquired: Secondary | ICD-10-CM | POA: Insufficient documentation

## 2017-03-03 DIAGNOSIS — K8020 Calculus of gallbladder without cholecystitis without obstruction: Secondary | ICD-10-CM | POA: Diagnosis not present

## 2017-03-03 DIAGNOSIS — D696 Thrombocytopenia, unspecified: Secondary | ICD-10-CM | POA: Insufficient documentation

## 2017-03-03 NOTE — Telephone Encounter (Signed)
Patient asking to go ahead and get iron infusion ASAP and not wait until Friday. Please advise

## 2017-03-03 NOTE — Telephone Encounter (Signed)
Message has been sent to scheduling to contact pt with appt

## 2017-03-06 ENCOUNTER — Telehealth: Payer: Self-pay | Admitting: Gastroenterology

## 2017-03-06 ENCOUNTER — Other Ambulatory Visit: Payer: Self-pay | Admitting: Oncology

## 2017-03-06 ENCOUNTER — Inpatient Hospital Stay: Payer: BLUE CROSS/BLUE SHIELD

## 2017-03-06 ENCOUNTER — Encounter: Payer: Self-pay | Admitting: Oncology

## 2017-03-06 ENCOUNTER — Inpatient Hospital Stay (HOSPITAL_BASED_OUTPATIENT_CLINIC_OR_DEPARTMENT_OTHER): Payer: BLUE CROSS/BLUE SHIELD | Admitting: Oncology

## 2017-03-06 VITALS — BP 121/81 | HR 55

## 2017-03-06 VITALS — BP 102/68 | HR 69 | Temp 96.1°F | Wt 222.6 lb

## 2017-03-06 DIAGNOSIS — R1031 Right lower quadrant pain: Secondary | ICD-10-CM | POA: Diagnosis not present

## 2017-03-06 DIAGNOSIS — R1013 Epigastric pain: Secondary | ICD-10-CM | POA: Diagnosis not present

## 2017-03-06 DIAGNOSIS — E039 Hypothyroidism, unspecified: Secondary | ICD-10-CM

## 2017-03-06 DIAGNOSIS — Z9884 Bariatric surgery status: Secondary | ICD-10-CM

## 2017-03-06 DIAGNOSIS — Z79899 Other long term (current) drug therapy: Secondary | ICD-10-CM

## 2017-03-06 DIAGNOSIS — I2510 Atherosclerotic heart disease of native coronary artery without angina pectoris: Secondary | ICD-10-CM

## 2017-03-06 DIAGNOSIS — K7469 Other cirrhosis of liver: Secondary | ICD-10-CM | POA: Diagnosis not present

## 2017-03-06 DIAGNOSIS — G4730 Sleep apnea, unspecified: Secondary | ICD-10-CM

## 2017-03-06 DIAGNOSIS — K648 Other hemorrhoids: Secondary | ICD-10-CM

## 2017-03-06 DIAGNOSIS — E538 Deficiency of other specified B group vitamins: Secondary | ICD-10-CM | POA: Diagnosis not present

## 2017-03-06 DIAGNOSIS — R161 Splenomegaly, not elsewhere classified: Secondary | ICD-10-CM

## 2017-03-06 DIAGNOSIS — R5383 Other fatigue: Secondary | ICD-10-CM

## 2017-03-06 DIAGNOSIS — D500 Iron deficiency anemia secondary to blood loss (chronic): Secondary | ICD-10-CM

## 2017-03-06 DIAGNOSIS — D61818 Other pancytopenia: Secondary | ICD-10-CM

## 2017-03-06 DIAGNOSIS — D72819 Decreased white blood cell count, unspecified: Secondary | ICD-10-CM

## 2017-03-06 DIAGNOSIS — Z803 Family history of malignant neoplasm of breast: Secondary | ICD-10-CM

## 2017-03-06 DIAGNOSIS — K7030 Alcoholic cirrhosis of liver without ascites: Secondary | ICD-10-CM

## 2017-03-06 DIAGNOSIS — K219 Gastro-esophageal reflux disease without esophagitis: Secondary | ICD-10-CM

## 2017-03-06 DIAGNOSIS — I10 Essential (primary) hypertension: Secondary | ICD-10-CM

## 2017-03-06 DIAGNOSIS — D696 Thrombocytopenia, unspecified: Secondary | ICD-10-CM

## 2017-03-06 DIAGNOSIS — Z86010 Personal history of colonic polyps: Secondary | ICD-10-CM

## 2017-03-06 DIAGNOSIS — R109 Unspecified abdominal pain: Secondary | ICD-10-CM

## 2017-03-06 DIAGNOSIS — K6389 Other specified diseases of intestine: Secondary | ICD-10-CM | POA: Diagnosis not present

## 2017-03-06 DIAGNOSIS — D649 Anemia, unspecified: Secondary | ICD-10-CM

## 2017-03-06 DIAGNOSIS — G8929 Other chronic pain: Secondary | ICD-10-CM | POA: Diagnosis not present

## 2017-03-06 DIAGNOSIS — E079 Disorder of thyroid, unspecified: Secondary | ICD-10-CM

## 2017-03-06 DIAGNOSIS — K709 Alcoholic liver disease, unspecified: Secondary | ICD-10-CM

## 2017-03-06 MED ORDER — IRON SUCROSE 20 MG/ML IV SOLN
200.0000 mg | Freq: Once | INTRAVENOUS | Status: AC
  Administered 2017-03-06: 200 mg via INTRAVENOUS
  Filled 2017-03-06: qty 10

## 2017-03-06 MED ORDER — SODIUM CHLORIDE 0.9 % IV SOLN: 200.0000 mg | Freq: Once | INTRAVENOUS | Status: DC

## 2017-03-06 MED ORDER — SODIUM CHLORIDE 0.9 % IV SOLN
Freq: Once | INTRAVENOUS | Status: AC
  Administered 2017-03-06: 11:00:00 via INTRAVENOUS
  Filled 2017-03-06: qty 1000

## 2017-03-06 NOTE — Progress Notes (Signed)
Patient here today for follow up and first IV iron treatment.  Patient c/o right abdominal pain and nausea

## 2017-03-06 NOTE — Progress Notes (Signed)
*   Hematology/Oncology Follow Up Visit note [facility] [phone] [phone]  CONSULT NOTE Patient Care Team: Adline Potter, MD as PCP - General (Family Medicine)  CHIEF COMPLAINTS/PURPOSE OF CONSULTATION:  I have low blood counts.   HISTORY OF PRESENTING ILLNESS:  Dylan Ho 66 y.o.  male with past medical history listed as below was referred by Dr. Vicente Masson to me for evaluation and management of patient's pancytopenia.   Patient denies any unintentional weight loss, fever/chills, weakness.Patient has a history of gastric bypass. Patient was hospitalized this April due to hematemesis. EGD showed anastomosis ulcer. Patient started on PPI.  Recently patient feels more stomach discomfort. He has not followed up with GI yet since April.   INTERVAL HISTORY Patient presents to discuss about the results. He report right lower quadrant pain, chronic,getting worse. Also still have epigastric discomfort.     ROS:  Review of Systems  Constitutional: Positive for fatigue.  HENT:  Negative.   Eyes: Negative.   Respiratory: Negative.   Cardiovascular: Negative.   Gastrointestinal: Positive for abdominal pain.  Endocrine: Negative.   Genitourinary: Negative.    Musculoskeletal: Negative.   Skin: Negative.   Neurological: Negative.   Hematological: Negative.   Psychiatric/Behavioral: Negative.     MEDICAL HISTORY:  Past Medical History:  Diagnosis Date  . Anemia   . Arthritis   . CAD (coronary artery disease)    No Stents Present- per patient  . Colon polyps   . GERD (gastroesophageal reflux disease)   . History of alcohol abuse Last Drink- 2011  . History of gout   . Hypertension   . Hypothyroidism   . Intestinal ulcer   . Iron deficiency anemia due to chronic blood loss 02/27/2017  . Overactive bladder   . Sleep apnea   . Thyroid disease     SURGICAL HISTORY: Past Surgical History:  Procedure Laterality Date  .  BILATERAL TOTAL SHOULDER ARTHROPLASTY  1990's  . COLONOSCOPY WITH PROPOFOL N/A 10/30/2016   Procedure: COLONOSCOPY WITH PROPOFOL;  Surgeon: Lucilla Lame, MD;  Location: [facility] ENDOSCOPY;  Service: Endoscopy;  Laterality: N/A;  . ESOPHAGOGASTRODUODENOSCOPY (EGD) WITH PROPOFOL N/A 10/30/2016   Procedure: ESOPHAGOGASTRODUODENOSCOPY (EGD) WITH PROPOFOL;  Surgeon: Lucilla Lame, MD;  Location: [facility] ENDOSCOPY;  Service: Endoscopy;  Laterality: N/A;  . GASTRIC BYPASS  2016   Dr. Darnell Level- [facility], [place]  . KNEE ARTHROSCOPY W/ MENISCAL REPAIR Bilateral   . REPLACEMENT TOTAL KNEE BILATERAL  1990's   Patient denies this surgery  . TONSILLECTOMY  2004    SOCIAL HISTORY: Social History   Social History  . Marital status: Married    Spouse name: N/A  . Number of children: N/A  . Years of education: N/A   Occupational History  . Not on file.   Social History Main Topics  . Smoking status: Never Smoker  . Smokeless tobacco: Current User    Types: Chew  . Alcohol use No     Comment: Heavy ETOH in past- Last Drink 2011 per patient  . Drug use: No  . Sexual activity: Not on file   Other Topics Concern  . Not on file   Social History Narrative  . No narrative on file    FAMILY HISTORY: Family History  Problem Relation Age of Onset  . Stroke Mother   . Breast cancer Mother   . Heart disease Father   . Stroke Father   . AAA (abdominal aortic aneurysm) Father   . Lung cancer  Paternal Grandmother   . Prostate cancer Neg Hx   . Bladder Cancer Neg Hx   . Kidney cancer Neg Hx     ALLERGIES:  has No Known Allergies.  MEDICATIONS:  Current Outpatient Prescriptions  Medication Sig Dispense Refill  . lactulose (CHRONULAC) 10 GM/15ML solution Take 15 mLs (10 g total) by mouth 2 (two) times daily as needed for mild constipation. 950 mL 2  . levothyroxine (SYNTHROID, LEVOTHROID) 150 MCG tablet Take 1 tablet (150 mcg total) by mouth daily before breakfast. 30 tablet 2  . mirabegron ER (MYRBETRIQ) 50 MG  TB24 tablet Take 1 tablet (50 mg total) by mouth daily. 30 tablet 11  . Multiple Vitamin (MULTIVITAMIN) capsule Take 1 capsule by mouth daily.    . pantoprazole (PROTONIX) 40 MG tablet Take 1 tablet (40 mg total) by mouth 2 (two) times daily. 60 tablet 2  . traZODone (DESYREL) 150 MG tablet Take 150 mg by mouth at bedtime.    . vitamin B-12 (CYANOCOBALAMIN) 500 MCG tablet Take 1,000 mcg by mouth daily.      No current facility-administered medications for this visit.       [name] Kitchen  PHYSICAL EXAMINATION: ECOG PERFORMANCE STATUS: 0 - Asymptomatic Vitals:   03/06/17 1008  BP: 102/68  Pulse: 69  Temp: (!) 96.1 F (35.6 C)   Filed Weights   03/06/17 1008  Weight: 222 lb 9 oz (101 kg)    GENERAL:  Alert, no distress and comfortable.  EYES: no pallor or icterus OROPHARYNX: no thrush or ulceration; good dentition  NECK: supple, no masses felt LYMPH:  no palpable lymphadenopathy in the cervical, axillary or inguinal regions LUNGS: clear to auscultation and  No wheeze or crackles HEART/CVS: regular rate & rhythm and no murmurs; No lower extremity edema ABDOMEN: abdomen soft, epigastric tender and normal bowel sounds Musculoskeletal:no cyanosis of digits and no clubbing  PSYCH: alert & oriented x 3  NEURO: no focal motor/sensory deficits SKIN:  no rashes or significant lesions  LABORATORY DATA:  I have reviewed the data as listed Lab Results  Component Value Date   WBC 3.5 (L) 02/26/2017   HGB 11.7 (L) 02/26/2017   HCT 36.0 (L) 02/26/2017   MCV 85.9 02/26/2017   PLT 120 (L) 02/26/2017    Recent Labs  12/12/16 1041 02/19/17 1111 02/26/17 1135  NA 142 144 140  K 4.9 4.5 4.1  CL 109* 106 105  CO2 20 24 32  GLUCOSE 84 86 93  BUN 23 10 12   CREATININE 1.02 0.84 0.83  CALCIUM 9.0 8.8 9.1  GFRNONAA 77 93 >60  GFRAA 89 107 >60  PROT 6.2 6.0 6.9  ALBUMIN 4.4 4.3 4.4  AST 26 31 34  ALT 15 21 24   ALKPHOS 70 83 81  BILITOT 0.4 0.4 0.9   Results for Dylan Ho, Dylan Ho (MRN  [id number]) as of 02/26/2017 11:12  Ref. Range 12/12/2016 10:41 01/09/2017 10:36 02/09/2017 10:35 02/19/2017 11:11  TSH Latest Ref Range: 0.450 - 4.500 uIU/mL 25.950 (H) 0.195 (L) 0.378 (L) 0.188 (L)   Iron/TIBC/Ferritin/ %Sat    Component Value Date/Time   IRON 24 (L) 02/26/2017 1135   TIBC 427 02/26/2017 1135   FERRITIN 4 (L) 02/26/2017 1135   IRONPCTSAT 6 (L) 02/26/2017 1135   RADIOGRAPHIC STUDIES: I have personally reviewed the radiological images as listed and agreed with the findings in the report. US Abdomen Complete  Result Date: 03/03/2017 CLINICAL DATA:  Thrombocytopenia.  Cirrhosis.  Splenomegaly. EXAM: ABDOMEN ULTRASOUND COMPLETE COMPARISON:  CT 10/05/2016, 03/18/2016.  Ultrasound 03/15/2016. FINDINGS: Gallbladder: Multiple gallstones. Gallbladder is distended. Gallbladder wall thickness 2.1 mm. Negative Murphy sign. Common bile duct: Diameter: 3.5 mm Liver: Heterogeneous parenchymal pattern consistent fatty infiltration and/or hepatocellular disease in this patient with known cirrhosis. No focal hepatic abnormality identified. Portal vein is patent on color Doppler imaging with normal direction of blood flow towards the liver. IVC: No abnormality visualized. Pancreas: Not visualized due to overlying bowel gas. Spleen: Size and appearance within normal limits. Right Kidney: Length: 12.1 cm. Echogenicity within normal limits. 2.0 cm cyst with thin septation again noted. No hydronephrosis visualized. Left Kidney: Length: 11.1 cm. Echogenicity within normal limits. No mass or hydronephrosis visualized. Abdominal aorta: No aneurysm visualized. Other findings: None. IMPRESSION: 1. Multiple gallstones. The gallbladder is distended. Gallbladder wall thickness normal. Negative Murphy sign. No biliary distention. 2. Heterogeneous hepatic parenchymal pattern consistent with fatty infiltration and/or hepatocellular disease in this patient with known cirrhosis. No focal hepatic abnormality. Portal vein is  patent with normal direction of blood flow. No splenomegaly. 3. 2 cm cyst with thin septation again noted in the right kidney. This is consistent with a benign cyst. Electronically Signed   By: Marcello Moores  Register   On: 03/03/2017 09:37    CT 10/05/2016 IMPRESSION: 1. Increased attenuation in the small bowel mesentery, which is new since the prior CT. This has an appearance consistent with mesenteric panniculitis. 2. No other acute abnormalities or changes from the prior study. 3. Cirrhosis with borderline splenomegaly. 4. Small gallstone.  No acute cholecystitis. 5. Stable changes from previous gastric bypass surgery.  10/30/2016 colonoscopy and EGD showed Normal esophagus. - Gastric bypass with a normal-sized pouch and intact staple line. Gastrojejunal anastomosis characterized by ulceration. - Normal examined jejunum. The perianal and digital rectal examinations were normal. Bleeding internal hemorrhoids were found. The hemorrhoids were Grade II   03/03/2017 IMPRESSION: 1. Multiple gallstones. The gallbladder is distended. Gallbladder wall thickness normal. Negative Murphy sign. No biliary distention. 2. Heterogeneous hepatic parenchymal pattern consistent with fatty infiltration and/or hepatocellular disease in this patient with known cirrhosis. No focal hepatic abnormality. Portal vein is patent with normal direction of blood flow. No splenomegaly. 3. 2 cm cyst with thin septation again noted in the right kidney. This is consistent with a benign cyst.  ASSESSMENT & PLAN:  1. Iron deficiency anemia due to chronic blood loss   2. Other pancytopenia ([place])   3. H/O gastric bypass   4. Thrombocytopenia (HCC)   5. Leukopenia, unspecified type   6. Normocytic anemia   7. Alcoholic liver disease, unspecified ([place])    # Lab results and Image results were discussed with patient detail. He has chronic pancytopenia. Leukopenia and thrombocytopenia likely are secondary to chronic liver  disease/cirrhosis/hypersplenism.  Flow cytometry is pending.   # Normocytic anemia with iron deficiency from chronic blood loss. Discussed about IV iron with venofer weekly x 4. Infusion reaction including anaphylactic reactions discussed with patient and patient is willing to proceed.  # Last B12 level was normal. # Encourage Follow up with Dr.Wohl for repeat EGD, and evaluation of possible liver cirrhosis.   all questions were answered. The patient knows to call the clinic with any problems questions or concerns.  Return of visit: 4 weeks with repeat cbc, iron tibc ferritin. Thank you for this kind referral and the opportunity to participate in the care of this patient. A copy of today's note is routed to referring provider    Earlie Server, MD, PhD Hematology  Oncology [facility] Pager- [phone] 03/06/2017

## 2017-03-06 NOTE — Telephone Encounter (Signed)
Patient called to make an appt with Dr. Allen Norris. Stated he has been losing blood and needs an appt before Oct. He has been getting infusions. Please call

## 2017-03-09 ENCOUNTER — Encounter: Payer: Self-pay | Admitting: Family Medicine

## 2017-03-10 ENCOUNTER — Other Ambulatory Visit: Payer: Self-pay | Admitting: Family Medicine

## 2017-03-10 ENCOUNTER — Encounter: Payer: Self-pay | Admitting: Family Medicine

## 2017-03-10 LAB — COMP PANEL: LEUKEMIA/LYMPHOMA

## 2017-03-10 MED ORDER — PANTOPRAZOLE SODIUM 40 MG PO TBEC: 40.0000 mg | DELAYED_RELEASE_TABLET | Freq: Two times a day (BID) | ORAL | 2 refills | Status: DC

## 2017-03-10 NOTE — Telephone Encounter (Signed)
Pt response.

## 2017-03-10 NOTE — Telephone Encounter (Signed)
Additional  

## 2017-03-11 ENCOUNTER — Other Ambulatory Visit: Payer: Self-pay | Admitting: Family Medicine

## 2017-03-11 MED ORDER — ZALEPLON 5 MG PO CAPS: 5.0000 mg | ORAL_CAPSULE | Freq: Every evening | ORAL | 0 refills | Status: DC | PRN

## 2017-03-12 ENCOUNTER — Other Ambulatory Visit: Payer: Self-pay | Admitting: Family Medicine

## 2017-03-12 DIAGNOSIS — G4700 Insomnia, unspecified: Secondary | ICD-10-CM

## 2017-03-12 DIAGNOSIS — G4733 Obstructive sleep apnea (adult) (pediatric): Secondary | ICD-10-CM

## 2017-03-12 MED ORDER — CEFAZOLIN SODIUM-DEXTROSE 2-4 GM/100ML-% IV SOLN
2.0000 g | INTRAVENOUS | Status: AC
  Administered 2017-03-13: 2 g via INTRAVENOUS

## 2017-03-12 NOTE — Telephone Encounter (Signed)
Pt has been scheduled for an office appt with Wohl on 03/18/17 @ 9:00am.

## 2017-03-13 ENCOUNTER — Inpatient Hospital Stay: Payer: BLUE CROSS/BLUE SHIELD

## 2017-03-13 ENCOUNTER — Encounter: Payer: Self-pay | Admitting: *Deleted

## 2017-03-13 ENCOUNTER — Encounter
Admission: RE | Disposition: A | Discharge status: Home / Self Care | Payer: Self-pay | Source: Ambulatory Visit | Attending: Urology

## 2017-03-13 ENCOUNTER — Ambulatory Visit
Admission: RE | Admit: 2017-03-13 | Discharge: 2017-03-13 | Disposition: A | Discharge status: Home / Self Care | Payer: BLUE CROSS/BLUE SHIELD | Source: Ambulatory Visit | Attending: Urology | Admitting: Urology

## 2017-03-13 ENCOUNTER — Ambulatory Visit: Payer: BLUE CROSS/BLUE SHIELD | Admitting: Anesthesiology

## 2017-03-13 DIAGNOSIS — R490 Dysphonia: Secondary | ICD-10-CM | POA: Insufficient documentation

## 2017-03-13 DIAGNOSIS — I2510 Atherosclerotic heart disease of native coronary artery without angina pectoris: Secondary | ICD-10-CM | POA: Insufficient documentation

## 2017-03-13 DIAGNOSIS — Z803 Family history of malignant neoplasm of breast: Secondary | ICD-10-CM | POA: Insufficient documentation

## 2017-03-13 DIAGNOSIS — Z8711 Personal history of peptic ulcer disease: Secondary | ICD-10-CM | POA: Diagnosis not present

## 2017-03-13 DIAGNOSIS — Z96653 Presence of artificial knee joint, bilateral: Secondary | ICD-10-CM | POA: Diagnosis not present

## 2017-03-13 DIAGNOSIS — Z9884 Bariatric surgery status: Secondary | ICD-10-CM | POA: Diagnosis not present

## 2017-03-13 DIAGNOSIS — Z823 Family history of stroke: Secondary | ICD-10-CM | POA: Insufficient documentation

## 2017-03-13 DIAGNOSIS — E079 Disorder of thyroid, unspecified: Secondary | ICD-10-CM | POA: Insufficient documentation

## 2017-03-13 DIAGNOSIS — N471 Phimosis: Secondary | ICD-10-CM | POA: Insufficient documentation

## 2017-03-13 DIAGNOSIS — Z8249 Family history of ischemic heart disease and other diseases of the circulatory system: Secondary | ICD-10-CM | POA: Diagnosis not present

## 2017-03-13 DIAGNOSIS — Q5564 Hidden penis: Secondary | ICD-10-CM | POA: Diagnosis not present

## 2017-03-13 DIAGNOSIS — Z86010 Personal history of colonic polyps: Secondary | ICD-10-CM | POA: Diagnosis not present

## 2017-03-13 DIAGNOSIS — D649 Anemia, unspecified: Secondary | ICD-10-CM | POA: Insufficient documentation

## 2017-03-13 DIAGNOSIS — N3281 Overactive bladder: Secondary | ICD-10-CM | POA: Insufficient documentation

## 2017-03-13 DIAGNOSIS — G4730 Sleep apnea, unspecified: Secondary | ICD-10-CM | POA: Diagnosis not present

## 2017-03-13 DIAGNOSIS — N475 Adhesions of prepuce and glans penis: Secondary | ICD-10-CM | POA: Diagnosis not present

## 2017-03-13 HISTORY — PX: CIRCUMCISION REVISION: SHX6634

## 2017-03-13 SURGERY — REVISION, CIRCUMCISION
Anesthesia: General | Wound class: Clean Contaminated

## 2017-03-13 MED ORDER — PROPOFOL 10 MG/ML IV BOLUS
INTRAVENOUS | Status: AC
  Filled 2017-03-13: qty 20

## 2017-03-13 MED ORDER — LIDOCAINE HCL (PF) 2 % IJ SOLN
INTRAMUSCULAR | Status: AC
Start: 2017-03-13 — End: 2017-03-13
  Filled 2017-03-13: qty 2

## 2017-03-13 MED ORDER — LACTATED RINGERS IV SOLN
INTRAVENOUS | Status: DC
  Administered 2017-03-13 (×2): via INTRAVENOUS

## 2017-03-13 MED ORDER — BUPIVACAINE HCL (PF) 0.5 % IJ SOLN
INTRAMUSCULAR | Status: AC
  Filled 2017-03-13: qty 30

## 2017-03-13 MED ORDER — DEXAMETHASONE SODIUM PHOSPHATE 10 MG/ML IJ SOLN
INTRAMUSCULAR | Status: DC | PRN
  Administered 2017-03-13: 10 mg via INTRAVENOUS

## 2017-03-13 MED ORDER — LIDOCAINE HCL (CARDIAC) 20 MG/ML IV SOLN
INTRAVENOUS | Status: DC | PRN
  Administered 2017-03-13: 40 mg via INTRAVENOUS

## 2017-03-13 MED ORDER — ROCURONIUM BROMIDE 50 MG/5ML IV SOLN
INTRAVENOUS | Status: AC
  Filled 2017-03-13: qty 1

## 2017-03-13 MED ORDER — HYDROCODONE-ACETAMINOPHEN 5-325 MG PO TABS: 1.0000 | ORAL_TABLET | ORAL | 0 refills | Status: DC | PRN

## 2017-03-13 MED ORDER — MIDAZOLAM HCL 2 MG/2ML IJ SOLN
INTRAMUSCULAR | Status: DC | PRN
  Administered 2017-03-13: 2 mg via INTRAVENOUS

## 2017-03-13 MED ORDER — ONDANSETRON HCL 4 MG/2ML IJ SOLN
INTRAMUSCULAR | Status: DC | PRN
  Administered 2017-03-13: 4 mg via INTRAVENOUS

## 2017-03-13 MED ORDER — FENTANYL CITRATE (PF) 100 MCG/2ML IJ SOLN: 25.0000 ug | INTRAMUSCULAR | Status: DC | PRN

## 2017-03-13 MED ORDER — ONDANSETRON HCL 4 MG/2ML IJ SOLN: 4.0000 mg | Freq: Once | INTRAMUSCULAR | Status: DC | PRN

## 2017-03-13 MED ORDER — FENTANYL CITRATE (PF) 100 MCG/2ML IJ SOLN
INTRAMUSCULAR | Status: AC
  Filled 2017-03-13: qty 2

## 2017-03-13 MED ORDER — ACETAMINOPHEN 10 MG/ML IV SOLN
INTRAVENOUS | Status: AC
  Filled 2017-03-13: qty 100

## 2017-03-13 MED ORDER — EPHEDRINE SULFATE 50 MG/ML IJ SOLN
INTRAMUSCULAR | Status: DC | PRN
  Administered 2017-03-13: 10 mg via INTRAVENOUS

## 2017-03-13 MED ORDER — PROPOFOL 10 MG/ML IV BOLUS
INTRAVENOUS | Status: DC | PRN
  Administered 2017-03-13: 150 mg via INTRAVENOUS
  Administered 2017-03-13: 20 mg via INTRAVENOUS
  Administered 2017-03-13: 50 mg via INTRAVENOUS

## 2017-03-13 MED ORDER — FENTANYL CITRATE (PF) 100 MCG/2ML IJ SOLN
INTRAMUSCULAR | Status: DC | PRN
  Administered 2017-03-13 (×6): 25 ug via INTRAVENOUS

## 2017-03-13 MED ORDER — ACETAMINOPHEN 10 MG/ML IV SOLN
INTRAVENOUS | Status: DC | PRN
  Administered 2017-03-13: 1000 mg via INTRAVENOUS

## 2017-03-13 MED ORDER — CEFAZOLIN SODIUM-DEXTROSE 2-4 GM/100ML-% IV SOLN
INTRAVENOUS | Status: AC
  Filled 2017-03-13: qty 100

## 2017-03-13 MED ORDER — CEPHALEXIN 500 MG PO CAPS
500.0000 mg | ORAL_CAPSULE | Freq: Three times a day (TID) | ORAL | 0 refills | Status: DC
Start: 2017-03-13 — End: 2017-03-23

## 2017-03-13 MED ORDER — BUPIVACAINE HCL (PF) 0.5 % IJ SOLN
INTRAMUSCULAR | Status: DC | PRN
  Administered 2017-03-13: 10 mL

## 2017-03-13 MED ORDER — GLYCOPYRROLATE 0.2 MG/ML IJ SOLN
INTRAMUSCULAR | Status: DC | PRN
  Administered 2017-03-13: .2 mg via INTRAVENOUS

## 2017-03-13 MED ORDER — MIDAZOLAM HCL 2 MG/2ML IJ SOLN
INTRAMUSCULAR | Status: AC
  Filled 2017-03-13: qty 2

## 2017-03-13 SURGICAL SUPPLY — 28 items
BLADE SURG 15 STRL LF DISP TIS (BLADE) ×1 IMPLANT
BLADE SURG 15 STRL SS (BLADE) ×1
CANISTER SUCT 1200ML W/VALVE (MISCELLANEOUS) ×2 IMPLANT
CHLORAPREP W/TINT 26ML (MISCELLANEOUS) ×2 IMPLANT
DRAPE LAPAROTOMY 77X122 PED (DRAPES) ×2 IMPLANT
ELECT REM PT RETURN 9FT ADLT (ELECTROSURGICAL) ×2
ELECTRODE REM PT RTRN 9FT ADLT (ELECTROSURGICAL) ×1 IMPLANT
GAUZE PETROLATUM 1 X8 (GAUZE/BANDAGES/DRESSINGS) ×4 IMPLANT
GAUZE STRETCH 2X75IN STRL (MISCELLANEOUS) ×2 IMPLANT
GLOVE BIO SURGEON STRL SZ7 (GLOVE) ×4 IMPLANT
GLOVE INDICATOR 7.5 STRL GRN (GLOVE) ×4 IMPLANT
GOWN STRL REUS W/ TWL LRG LVL3 (GOWN DISPOSABLE) ×1 IMPLANT
GOWN STRL REUS W/ TWL XL LVL3 (GOWN DISPOSABLE) ×1 IMPLANT
GOWN STRL REUS W/TWL LRG LVL3 (GOWN DISPOSABLE) ×1
GOWN STRL REUS W/TWL XL LVL3 (GOWN DISPOSABLE) ×1
KIT RM TURNOVER STRD PROC AR (KITS) ×2 IMPLANT
LABEL OR SOLS (LABEL) IMPLANT
NEEDLE HYPO 25X1 1.5 SAFETY (NEEDLE) ×2 IMPLANT
NS IRRIG 500ML POUR BTL (IV SOLUTION) ×2 IMPLANT
PACK BASIN MINOR ARMC (MISCELLANEOUS) ×2 IMPLANT
SPONGE LAP 18X18 5 PK (GAUZE/BANDAGES/DRESSINGS) ×2 IMPLANT
SPONGE XRAY 4X4 16PLY STRL (MISCELLANEOUS) ×2 IMPLANT
SUT CHROMIC 3 0 SH 27 (SUTURE) ×6 IMPLANT
SUT MNCRL 3-0 UNDYED SH (SUTURE) ×2 IMPLANT
SUT MONOCRYL 3-0 UNDYED (SUTURE) ×2
SUT VIC AB 2-0 SH 27 (SUTURE) ×2
SUT VIC AB 2-0 SH 27XBRD (SUTURE) ×2 IMPLANT
SYRINGE 10CC LL (SYRINGE) ×2 IMPLANT

## 2017-03-13 NOTE — Transfer of Care (Signed)
Immediate Anesthesia Transfer of Care Note  Patient: Dylan Ho  Procedure(s) Performed: Procedure(s): CIRCUMCISION REVISION (N/A)  Patient Location: PACU  Anesthesia Type:General  Level of Consciousness: awake  Airway & Oxygen Therapy: Patient Spontanous Breathing and Patient connected to face mask oxygen  Post-op Assessment: Report given to RN and Post -op Vital signs reviewed and stable  Post vital signs: Reviewed and stable  Last Vitals:  Vitals:   03/13/17 0751 03/13/17 1105  BP: 138/86 140/75  Pulse: 65 78  Resp: 18 11  Temp: 36.4 C (!) 36.2 C  SpO2: 100% 100%    Last Pain:  Vitals:   03/13/17 0751  TempSrc: Oral         Complications: No apparent anesthesia complications

## 2017-03-13 NOTE — Discharge Instructions (Signed)

## 2017-03-13 NOTE — Op Note (Signed)
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 Same   Procedure: 1. Revision of circumcision  Surgeon: Baruch Gouty, MD  Anesthesia: General  Complications: None  Intraoperative findings: The patient is very small opening at the tip of the secondary phimosis. It required a dorsal slit to reduce. He had significant inflammation and adhesions of the inner phimotic skin to his glans. Reconstruction was with very difficult after excising the inflamed phimotic ring tissue. Penile scrotal skin as described below was used to cover the penis.  EBL: 10 cc  Specimens: None  Drains: None  Disposition: Stable to the postanesthesia care unit  Indication for procedure: The patient is a 66 y.o. 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 He presents today for revision of his circumcision. We had multiple discussions of the concern for potential for penile skin shortening after this procedure as well as poor cosmesis.  After reviewing the management options for treatment, the patient elected to proceed with the above surgical procedure(s). We have discussed the potential benefits and risks of the procedure, side effects of the proposed treatment, the likelihood of the patient achieving the goals of the procedure, and any potential problems that might occur during the procedure or recuperation. Informed consent has been obtained.  Description of procedure: The patient was met in the preoperative area. All risks, benefits, and indications of the procedure were described in great detail. The patient consented to the procedure. Preoperative antibiotics were given. The patient was taken to the operative theater. General anesthesia was induced per the anesthesia service. The patient was then placed in the supine  position and prepped and draped in the usual sterile fashion. A preoperative timeout was called.   The patient had a tiny opening at the tip of his secondary phimosis skin. His foreskin was unable to reduce. A dorsal slit was performed by placing a hemostat on the torso tissue to obtain hemostasis followed by incising the stitches Metzenbaum scissors. This took 2 different times before the penis is eventually able to reduce. Upon reduction the proximal glans was severely adherent to the inner foreskin tissue. It took a mixture of manual pressure as well as Metzenbaum scissors to eventually remove the adherent tissue off the proximal glans. This was particularly difficult on the dorsal portion. This inflammatory reaction is likely from many years of urine trapping in this area causing significant inflammation. Once this was reduced, there was very Kerstetter distal penile shaft tissue surrounding the glans particularly dorsally. An attempt was initially made at this point to reapproximate and the edges of the dorsal slit vertically however this still causation constricting phimosis ring around the distal glans. This was done to try to avoid penile shortening. However this point, it was decided that it the significantly inflamed contracted with phimosis ring tissue and no choice but removed which would result in penile shortening. I was also concern that removing this tissue would lead to poor wound healing. At this point, the inflamed tissue of the inner foreskin that had been adhered to the distal glans was excised. He did have approximately 1-2 cm of distal penile tissue remaining that was healthy on the ventral side of the penis. Again, there was very Dente distal penile tissue on the dorsal side as it had been severely adhered in an inflammatory reaction when  it was adherent to the glans penis. To minimize penile shortening, the proximal dartos layer of the proximal penile tissue from the 3:00 position was affixed  to the 12:00 position on the distal aspect of the penis with multiple 2-0 Vicryl suture to allow for a tension-free reapproximation. This was repeated with proximal penlie tissue from the 9:00 position. This created a tension free circumferential close proximity is a patient of the distal and proximal penile skin. The edges were reapproximated with interrupted 3-0 chromic suture circumferentially around the penis. Due to the manipulation of tissues to obtain the above reapproximation, he did have a vertical line of penile tissue was reapproximated with interrupted 3-0 chromic. This was due to the essential wings of tissue that were brought from the side of the penis did move dorsally. At this point there was good reapproximation of tissue and a tension-free closure. There was a minor amount of penile shortening noted though this would only be an issue in this situation erection which the patient has not had an many many years. At this point the incisions were covered with Vaseline gauze followed by normal gauze. The patient was then awoken from anesthesia and transferred in stable condition to the postanesthesia care unit.  Plan: The patient will follow-up in approximately one month for a wound check.  Baruch Gouty, M.D.

## 2017-03-13 NOTE — Anesthesia Post-op Follow-up Note (Signed)
Anesthesia QCDR form completed.        

## 2017-03-13 NOTE — H&P (Signed)
03/13/2017  [place] [birth year] [id number]  Referring provider: Adline Potter, MD [address] [place], [place] [address]     Chief Complaint  Patient presents with  . Over Active Bladder    HPI: The patient is a 66 year old gentleman who presents today for evaluation of tightening of the skin edges around his glans penis as well as an overactive bladder.  1. Secondary phimosis The patient complains that for the last few years she has noticed the skin edges around his penis starting to close over the tip of his penis. He describes that it looks like he was never circumcised though he was. He only has a pinpoint opening of the skin edges currently. Urine gets trapped in there and he gets red and irritated. He is increasingly bothered by this. 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  2. Overactive bladder Patient complains of multiyear history of hoarseness the urge with urge incontinence. He also has urinary frequency sometimes up to every 30 minutes. He has nocturia 1. He is unsure if he empties his bladder. He notes a good stream. He does not have to strain and has no intermittency. PVR today is 32. He tried Detrol for 1 month but has since stopped this medication as it did not provide any relief of his symptoms.  PMH:     Past Medical History:  Diagnosis Date  . CAD (coronary artery disease)    No Stents Present- per patient  . Colon polyps   . History of alcohol abuse Last Drink- 2011  . Hypertension   . Sleep apnea   . Thyroid disease     Surgical History:      Past Surgical History:  Procedure Laterality Date  . BILATERAL TOTAL SHOULDER ARTHROPLASTY  1990's  . COLONOSCOPY WITH PROPOFOL N/A 10/30/2016   Procedure: COLONOSCOPY WITH PROPOFOL;  Surgeon: Lucilla Lame, MD;  Location: [facility];  Service: Endoscopy;  Laterality: N/A;  .  ESOPHAGOGASTRODUODENOSCOPY (EGD) WITH PROPOFOL N/A 10/30/2016   Procedure: ESOPHAGOGASTRODUODENOSCOPY (EGD) WITH PROPOFOL;  Surgeon: Lucilla Lame, MD;  Location: [facility] END[facility]COPY;  Service: Endoscopy;  Laterality: N/A;  . GASTRIC BYPASS  2016   Dr. Darnell Level- [place], [place]  . REPLACEMENT TOTAL KNEE BILATERAL  1990's  . TONSILLECTOMY  2004    Home Medications:  Allergies as of 02/10/2017   No Known Allergies                 Medication List            Accurate as of 02/10/17  9:12 AM. Always use your most recent med list.           glucosamine-chondroitin 500-400 MG tablet Take 1 tablet by mouth 3 (three) times daily.   lactulose 10 GM/15ML solution Commonly known as:  CHRONULAC Take 15 mLs (10 g total) by mouth 2 (two) times daily as needed for mild constipation.   levothyroxine 175 MCG tablet Commonly known as:  SYNTHROID, LEVOTHROID Take 1 tablet (175 mcg total) by mouth daily before breakfast.   multivitamin capsule Take 1 capsule by mouth daily.   pantoprazole 40 MG tablet Commonly known as:  PROTONIX Take 1 tablet (40 mg total) by mouth daily.   traZODone 150 MG tablet Commonly known as:  DESYREL Take 1 tablet (150 mg total) by mouth at bedtime as needed for sleep.   vitamin B-12 500 MCG tablet Commonly known  as:  CYANOCOBALAMIN Take 500 mcg by mouth daily.       Allergies: No Known Allergies  Family History:      Family History  Problem Relation Age of Onset  . Cancer Mother        Breast  . Heart disease Father   . Stroke Father   . AAA (abdominal aortic aneurysm) Father   . Prostate cancer Neg Hx   . Bladder Cancer Neg Hx   . Kidney cancer Neg Hx     Social History:  reports that he has never smoked. He has never used smokeless tobacco. He reports that he does not drink alcohol or use drugs.  ROS: UROLOGY Frequent Urination?: Yes Hard to postpone urination?: Yes Burning/pain with urination?: No Get up at night to  urinate?: Yes Leakage of urine?: No Urine stream starts and stops?: Yes Trouble starting stream?: No Do you have to strain to urinate?: No Blood in urine?: No Urinary tract infection?: No Sexually transmitted disease?: No Injury to kidneys or bladder?: No Painful intercourse?: No Weak stream?: No Erection problems?: No Penile pain?: No  Gastrointestinal Nausea?: No Vomiting?: No Indigestion/heartburn?: No Diarrhea?: No Constipation?: No  Constitutional Fever: No Night sweats?: No Weight loss?: Yes Fatigue?: No  Skin Skin rash/lesions?: No Itching?: No  Eyes Blurred vision?: No Double vision?: No  Ears/Nose/Throat Sore throat?: No Sinus problems?: Yes  Hematologic/Lymphatic Swollen glands?: No Easy bruising?: Yes  Cardiovascular Leg swelling?: No Chest pain?: No  Respiratory Cough?: No Shortness of breath?: No  Endocrine Excessive thirst?: No  Musculoskeletal Back pain?: Yes Joint pain?: No  Neurological Headaches?: No Dizziness?: No  Psychologic Depression?: No Anxiety?: No  Physical Exam: BP 96/60 (BP Location: Left Arm, Patient Position: Sitting, Cuff Size: Normal)   Pulse 68   Ht 6\' 1"  (1.854 m)   Wt 224 lb 3.2 oz (101.7 kg)   BMI 29.58 kg/m   Constitutional:  Alert and oriented, No acute distress. HEENT: [place] AT, moist mucus membranes.  Trachea midline, no masses. Cardiovascular: No clubbing, cyanosis, or edema. RRR Respiratory: Normal respiratory effort, no increased work of breathing. Lungs clear GI: Abdomen is soft, nontender, nondistended, no abdominal masses GU: No CVA tenphallus with evidence of secondary phimosis with only a pinpoint opening of the distal skin edges. Testicles descended and benign bilaterally. DRE: Benign 30 g Skin: No rashes, bruises or suspicious lesions. Lymph: No cervical or inguinal adenopathy. Neurologic: Grossly intact, no focal deficits, moving all 4 extremities. Psychiatric: Normal mood  and affect.  Laboratory Data: RecentLabs       Lab Results  Component Value Date   WBC 2.5 (LL) 02/09/2017   HGB 11.1 (L) 02/09/2017   HCT 35.0 (L) 02/09/2017   MCV 89 02/09/2017   PLT 86 (LL) 02/09/2017      RecentLabs       Lab Results  Component Value Date   CREATININE 1.02 12/12/2016      RecentLabs  No results found for: PSA    RecentLabs  No results found for: TESTOSTERONE    RecentLabs       Lab Results  Component Value Date   HGBA1C 4.7 (L) 12/12/2016      Urinalysis Labs(Brief)          Component Value Date/Time   COLORURINE YELLOW (A) 10/05/2016 0211   APPEARANCEUR CLEAR (A) 10/05/2016 0211   APPEARANCEUR Clear 07/15/2013 1327   LABSPEC 1.012 10/05/2016 0211   LABSPEC 1.008 07/15/2013 1327   PHURINE 6.0 10/05/2016  0211   GLUCOSEU NEGATIVE 10/05/2016 0211   GLUCOSEU Negative 07/15/2013 1327   HGBUR NEGATIVE 10/05/2016 0211   BILIRUBINUR NEGATIVE 10/05/2016 0211   BILIRUBINUR Negative 07/15/2013 1327   KETONESUR NEGATIVE 10/05/2016 0211   PROTEINUR NEGATIVE 10/05/2016 0211   NITRITE NEGATIVE 10/05/2016 0211   LEUKOCYTESUR TRACE (A) 10/05/2016 0211   LEUKOCYTESUR 2+ 07/15/2013 1327      Assessment & Plan:    1. OAB Patient is artery failed an anticholinergic. We'll try Myrbetriq 50 mg daily.  2. Secondary phimosis A discussed with the patient the only treatment for his secondary phimosis is revision of his circumcision. We discussed the risks, benefits, and indications of this procedure. I didn't explain the chance for penile skin shortening with revision of his circumcision. Also discussed iatrogenic injury, bleeding, infection, and poor cosmesis/wound healing. All questions were answered. The patient has elected to proceed.  Nickie Retort, MD  [facility] [address] [place], [place] [address] [phone]

## 2017-03-13 NOTE — Anesthesia Preprocedure Evaluation (Signed)
Anesthesia Evaluation  Patient identified by MRN, date of birth, ID band Patient awake    Reviewed: Allergy & Precautions, NPO status , Patient's Chart, lab work & pertinent test results  History of Anesthesia Complications Negative for: history of anesthetic complications  Airway Mallampati: II       Dental  (+) Missing, Chipped, Poor Dentition   Pulmonary sleep apnea (prior to 200 lb weight loss) ,           Cardiovascular hypertension (prior to weight loss), + CAD  (-) Past MI and (-) CHF (-) dysrhythmias (-) Valvular Problems/Murmurs     Neuro/Psych    GI/Hepatic Neg liver ROS, PUD,   Endo/Other  neg diabetesHypothyroidism   Renal/GU negative Renal ROS     Musculoskeletal   Abdominal   Peds  Hematology  (+) anemia ,   Anesthesia Other Findings   Reproductive/Obstetrics                             Anesthesia Physical Anesthesia Plan  ASA: III  Anesthesia Plan: General   Post-op Pain Management:    Induction: Intravenous  PONV Risk Score and Plan: 2 and Ondansetron and Dexamethasone  Airway Management Planned: LMA  Additional Equipment:   Intra-op Plan:   Post-operative Plan:   Informed Consent: I have reviewed the patients History and Physical, chart, labs and discussed the procedure including the risks, benefits and alternatives for the proposed anesthesia with the patient or authorized representative who has indicated his/her understanding and acceptance.     Plan Discussed with:   Anesthesia Plan Comments:         Anesthesia Quick Evaluation

## 2017-03-13 NOTE — Anesthesia Postprocedure Evaluation (Signed)
Anesthesia Post Note  Patient: Dylan Ho  Procedure(s) Performed: Procedure(s) (LRB): CIRCUMCISION REVISION (N/A)  Patient location during evaluation: PACU Anesthesia Type: General Level of consciousness: awake and alert Pain management: pain level controlled Vital Signs Assessment: post-procedure vital signs reviewed and stable Respiratory status: spontaneous breathing and respiratory function stable Cardiovascular status: stable Anesthetic complications: no     Last Vitals:  Vitals:   03/13/17 1105 03/13/17 1119  BP: 140/75   Pulse: 78 80  Resp: 11 15  Temp: (!) 36.2 C   SpO2: 100% 100%    Last Pain:  Vitals:   03/13/17 1119  TempSrc:   PainSc: 0-No pain                 Derelle Cockrell K

## 2017-03-13 NOTE — Anesthesia Procedure Notes (Signed)
Procedure Name: LMA Insertion Date/Time: 03/13/2017 8:43 AM Performed by: Allean Found Pre-anesthesia Checklist: Patient identified, Emergency Drugs available, Suction available, Patient being monitored and Timeout performed Patient Re-evaluated:Patient Re-evaluated prior to induction Oxygen Delivery Method: Circle system utilized Preoxygenation: Pre-oxygenation with 100% oxygen Induction Type: IV induction Ventilation: Mask ventilation without difficulty LMA: LMA inserted LMA Size: 5.0 Number of attempts: 1 Tube secured with: Tape Dental Injury: Teeth and Oropharynx as per pre-operative assessment

## 2017-03-16 ENCOUNTER — Inpatient Hospital Stay: Payer: BLUE CROSS/BLUE SHIELD | Attending: Oncology

## 2017-03-16 VITALS — BP 123/80 | HR 60 | Temp 96.2°F | Resp 18

## 2017-03-16 DIAGNOSIS — E538 Deficiency of other specified B group vitamins: Secondary | ICD-10-CM | POA: Diagnosis not present

## 2017-03-16 DIAGNOSIS — Z9884 Bariatric surgery status: Secondary | ICD-10-CM

## 2017-03-16 DIAGNOSIS — D508 Other iron deficiency anemias: Secondary | ICD-10-CM | POA: Diagnosis not present

## 2017-03-16 DIAGNOSIS — D61818 Other pancytopenia: Secondary | ICD-10-CM

## 2017-03-16 DIAGNOSIS — D649 Anemia, unspecified: Secondary | ICD-10-CM

## 2017-03-16 DIAGNOSIS — D500 Iron deficiency anemia secondary to blood loss (chronic): Secondary | ICD-10-CM

## 2017-03-16 LAB — OCCULT BLOOD X 1 CARD TO LAB, STOOL
Fecal Occult Bld: POSITIVE — AB
Fecal Occult Bld: POSITIVE — AB
Fecal Occult Bld: NEGATIVE

## 2017-03-16 MED ORDER — IRON SUCROSE 20 MG/ML IV SOLN
200.0000 mg | Freq: Once | INTRAVENOUS | Status: AC
  Administered 2017-03-16: 200 mg via INTRAVENOUS
  Filled 2017-03-16: qty 10

## 2017-03-16 MED ORDER — SODIUM CHLORIDE 0.9 % IV SOLN: 200.0000 mg | Freq: Once | INTRAVENOUS | Status: DC

## 2017-03-16 MED ORDER — SODIUM CHLORIDE 0.9 % IV SOLN
Freq: Once | INTRAVENOUS | Status: AC
  Administered 2017-03-16: 10:00:00 via INTRAVENOUS
  Filled 2017-03-16: qty 1000

## 2017-03-16 NOTE — Patient Instructions (Signed)

## 2017-03-18 ENCOUNTER — Other Ambulatory Visit: Payer: Self-pay

## 2017-03-18 ENCOUNTER — Encounter: Payer: Self-pay | Admitting: Gastroenterology

## 2017-03-18 ENCOUNTER — Ambulatory Visit (INDEPENDENT_AMBULATORY_CARE_PROVIDER_SITE_OTHER): Payer: BLUE CROSS/BLUE SHIELD | Admitting: Gastroenterology

## 2017-03-18 VITALS — BP 114/67 | HR 61 | Temp 97.9°F | Ht 69.5 in | Wt 222.0 lb

## 2017-03-18 DIAGNOSIS — D500 Iron deficiency anemia secondary to blood loss (chronic): Secondary | ICD-10-CM

## 2017-03-18 MED ORDER — PANTOPRAZOLE SODIUM 40 MG PO TBEC: 40.0000 mg | DELAYED_RELEASE_TABLET | Freq: Two times a day (BID) | ORAL | 2 refills | Status: DC

## 2017-03-18 NOTE — Progress Notes (Signed)
Primary Care Physician: Adline Potter, MD  Primary Gastroenterologist:  Dr. Lucilla Lame  No chief complaint on file.   HPI: Dylan Ho is a 66 y.o. male here for weight loss. The patient was 240 pounds back a few months ago and is now 224 pounds. The patient had a colonoscopy that had a poor prep but did not show any signs of bleeding or anemia. The patient did have an upper endoscopy that showed anastomotic ulceration. The patient had his bypass surgery in 2016. The patient also reports that he has abdominal pain in the right lower quadrant when he moves, coughs or sneezes.  Current Outpatient Prescriptions  Medication Sig Dispense Refill  . cephALEXin (KEFLEX) 500 MG capsule Take 1 capsule (500 mg total) by mouth 3 (three) times daily. 6 capsule 0  . HYDROcodone-acetaminophen (NORCO) 5-325 MG tablet Take 1 tablet by mouth every 4 (four) hours as needed for moderate pain. 30 tablet 0  . lactulose (CHRONULAC) 10 GM/15ML solution Take 15 mLs (10 g total) by mouth 2 (two) times daily as needed for mild constipation. 950 mL 2  . levothyroxine (SYNTHROID, LEVOTHROID) 150 MCG tablet Take 1 tablet (150 mcg total) by mouth daily before breakfast. 30 tablet 2  . mirabegron ER (MYRBETRIQ) 50 MG TB24 tablet Take 1 tablet (50 mg total) by mouth daily. 30 tablet 11  . Multiple Vitamin (MULTIVITAMIN) capsule Take 1 capsule by mouth daily.    . pantoprazole (PROTONIX) 40 MG tablet Take 1 tablet (40 mg total) by mouth 2 (two) times daily. 60 tablet 2  . traZODone (DESYREL) 150 MG tablet Take 150 mg by mouth at bedtime.    . vitamin B-12 (CYANOCOBALAMIN) 500 MCG tablet Take 1,000 mcg by mouth daily.     . zaleplon (SONATA) 5 MG capsule Take 1 capsule (5 mg total) by mouth at bedtime as needed for sleep. (Patient not taking: Reported on 03/13/2017) 30 capsule 0   No current facility-administered medications for this visit.     Allergies as of 03/18/2017  . (No Known Allergies)     ROS:  General: Negative for anorexia, weight loss, fever, chills, fatigue, weakness. ENT: Negative for hoarseness, difficulty swallowing , nasal congestion. CV: Negative for chest pain, angina, palpitations, dyspnea on exertion, peripheral edema.  Respiratory: Negative for dyspnea at rest, dyspnea on exertion, cough, sputum, wheezing.  GI: See history of present illness. GU:  Negative for dysuria, hematuria, urinary incontinence, urinary frequency, nocturnal urination.  Endo: Negative for unusual weight change.    Physical Examination:   There were no vitals taken for this visit.  General: Well-nourished, well-developed in no acute distress.  Eyes: No icterus. Conjunctivae pink. Mouth: Oropharyngeal mucosa moist and pink , no lesions erythema or exudate. Lungs: Clear to auscultation bilaterally. Non-labored. Heart: Regular rate and rhythm, no murmurs rubs or gallops.  Abdomen: Bowel sounds are normal, tenderness in the right lower quadrant while flexing the abdominal wall muscles, nondistended, no hepatosplenomegaly or masses, no abdominal bruits or hernia , no rebound or guarding.   Extremities: No lower extremity edema. No clubbing or deformities. Neuro: Alert and oriented x 3.  Grossly intact. Skin: Warm and dry, no jaundice.   Psych: Alert and cooperative, normal mood and affect.  Labs:    Imaging Studies: US Abdomen Complete  Result Date: 03/03/2017 CLINICAL DATA:  Thrombocytopenia.  Cirrhosis.  Splenomegaly. EXAM: ABDOMEN ULTRASOUND COMPLETE COMPARISON:  CT 10/05/2016, 03/18/2016.  Ultrasound 03/15/2016. FINDINGS: Gallbladder: Multiple gallstones. Gallbladder is distended. Gallbladder  wall thickness 2.1 mm. Negative Murphy sign. Common bile duct: Diameter: 3.5 mm Liver: Heterogeneous parenchymal pattern consistent fatty infiltration and/or hepatocellular disease in this patient with known cirrhosis. No focal hepatic abnormality identified. Portal vein is patent on color  Doppler imaging with normal direction of blood flow towards the liver. IVC: No abnormality visualized. Pancreas: Not visualized due to overlying bowel gas. Spleen: Size and appearance within normal limits. Right Kidney: Length: 12.1 cm. Echogenicity within normal limits. 2.0 cm cyst with thin septation again noted. No hydronephrosis visualized. Left Kidney: Length: 11.1 cm. Echogenicity within normal limits. No mass or hydronephrosis visualized. Abdominal aorta: No aneurysm visualized. Other findings: None. IMPRESSION: 1. Multiple gallstones. The gallbladder is distended. Gallbladder wall thickness normal. Negative Murphy sign. No biliary distention. 2. Heterogeneous hepatic parenchymal pattern consistent with fatty infiltration and/or hepatocellular disease in this patient with known cirrhosis. No focal hepatic abnormality. Portal vein is patent with normal direction of blood flow. No splenomegaly. 3. 2 cm cyst with thin septation again noted in the right kidney. This is consistent with a benign cyst. Electronically Signed   By: Clark   On: 03/03/2017 09:37    Assessment and Plan:   Dylan Ho is a 66 y.o. y/o male with a history of gastric bypass surgery and anastomotic ulcer with heme positive stools and weight loss. The patient will be set up for repeat upper endoscopy. If the ulcer has not healed he will be referred back to his bariatric surgeon for possible revision. The patient's right lower quadrant pain is consistent with muscular skeletal pain I have discussed risks & benefits which include, but are not limited to, bleeding, infection, perforation & drug reaction.  The patient agrees with this plan & written consent will be obtained.       Lucilla Lame, MD. Marval Regal   Note: This dictation was prepared with Dragon dictation along with smaller phrase technology. Any transcriptional errors that result from this process are unintentional.

## 2017-03-19 ENCOUNTER — Other Ambulatory Visit: Payer: Self-pay

## 2017-03-19 DIAGNOSIS — D500 Iron deficiency anemia secondary to blood loss (chronic): Secondary | ICD-10-CM

## 2017-03-20 ENCOUNTER — Inpatient Hospital Stay: Payer: BLUE CROSS/BLUE SHIELD

## 2017-03-23 ENCOUNTER — Inpatient Hospital Stay: Payer: BLUE CROSS/BLUE SHIELD

## 2017-03-23 ENCOUNTER — Encounter: Payer: Self-pay | Admitting: *Deleted

## 2017-03-23 VITALS — BP 121/72 | HR 65 | Temp 96.8°F | Resp 18

## 2017-03-23 DIAGNOSIS — D61818 Other pancytopenia: Secondary | ICD-10-CM | POA: Diagnosis not present

## 2017-03-23 DIAGNOSIS — D500 Iron deficiency anemia secondary to blood loss (chronic): Secondary | ICD-10-CM

## 2017-03-23 MED ORDER — IRON SUCROSE 20 MG/ML IV SOLN
INTRAVENOUS | Status: AC
  Filled 2017-03-23: qty 10

## 2017-03-23 MED ORDER — IRON SUCROSE 20 MG/ML IV SOLN
200.0000 mg | Freq: Once | INTRAVENOUS | Status: AC
  Administered 2017-03-23: 200 mg via INTRAVENOUS
  Filled 2017-03-23: qty 10

## 2017-03-23 MED ORDER — SODIUM CHLORIDE 0.9 % IV SOLN
Freq: Once | INTRAVENOUS | Status: AC
  Administered 2017-03-23: 10:00:00 via INTRAVENOUS
  Filled 2017-03-23: qty 1000

## 2017-03-23 NOTE — Discharge Instructions (Signed)
General Anesthesia, Adult, Care After °These instructions provide you with information about caring for yourself after your procedure. Your health care provider may also give you more specific instructions. Your treatment has been planned according to current medical practices, but problems sometimes occur. Call your health care provider if you have any problems or questions after your procedure. °What can I expect after the procedure? °After the procedure, it is common to have: °· Vomiting. °· A sore throat. °· Mental slowness. ° °It is common to feel: °· Nauseous. °· Cold or shivery. °· Sleepy. °· Tired. °· Sore or achy, even in parts of your body where you did not have surgery. ° °Follow these instructions at home: °For at least 24 hours after the procedure: °· Do not: °? Participate in activities where you could fall or become injured. °? Drive. °? Use heavy machinery. °? Drink alcohol. °? Take sleeping pills or medicines that cause drowsiness. °? Make important decisions or sign legal documents. °? Take care of children on your own. °· Rest. °Eating and drinking °· If you vomit, drink water, juice, or soup when you can drink without vomiting. °· Drink enough fluid to keep your urine clear or pale yellow. °· Make sure you have [place] or no nausea before eating solid foods. °· Follow the diet recommended by your health care provider. °General instructions °· Have a responsible adult stay with you until you are awake and alert. °· Return to your normal activities as told by your health care provider. Ask your health care provider what activities are safe for you. °· Take over-the-counter and prescription medicines only as told by your health care provider. °· If you smoke, do not smoke without supervision. °· Keep all follow-up visits as told by your health care provider. This is important. °Contact a health care provider if: °· You continue to have nausea or vomiting at home, and medicines are not helpful. °· You  cannot drink fluids or start eating again. °· You cannot urinate after 8-12 hours. °· You develop a skin rash. °· You have fever. °· You have increasing redness at the site of your procedure. °Get help right away if: °· You have difficulty breathing. °· You have chest pain. °· You have unexpected bleeding. °· You feel that you are having a life-threatening or urgent problem. °This information is not intended to replace advice given to you by your health care provider. Make sure you discuss any questions you have with your health care provider. °Document Released: 09/29/2000 Document Revised: 11/26/2015 Document Reviewed: 06/07/2015 °Elsevier Interactive Patient Education © 2018 [employer] Inc. ° °

## 2017-03-26 ENCOUNTER — Ambulatory Visit: Payer: BLUE CROSS/BLUE SHIELD | Admitting: Anesthesiology

## 2017-03-26 ENCOUNTER — Encounter
Admission: RE | Disposition: A | Discharge status: Home / Self Care | Payer: Self-pay | Source: Ambulatory Visit | Attending: Gastroenterology

## 2017-03-26 ENCOUNTER — Ambulatory Visit
Admission: RE | Admit: 2017-03-26 | Discharge: 2017-03-26 | Disposition: A | Discharge status: Home / Self Care | Payer: BLUE CROSS/BLUE SHIELD | Source: Ambulatory Visit | Attending: Gastroenterology | Admitting: Gastroenterology

## 2017-03-26 DIAGNOSIS — K219 Gastro-esophageal reflux disease without esophagitis: Secondary | ICD-10-CM | POA: Insufficient documentation

## 2017-03-26 DIAGNOSIS — I2510 Atherosclerotic heart disease of native coronary artery without angina pectoris: Secondary | ICD-10-CM | POA: Diagnosis not present

## 2017-03-26 DIAGNOSIS — M109 Gout, unspecified: Secondary | ICD-10-CM | POA: Diagnosis not present

## 2017-03-26 DIAGNOSIS — K449 Diaphragmatic hernia without obstruction or gangrene: Secondary | ICD-10-CM | POA: Diagnosis not present

## 2017-03-26 DIAGNOSIS — Z9884 Bariatric surgery status: Secondary | ICD-10-CM | POA: Insufficient documentation

## 2017-03-26 DIAGNOSIS — D509 Iron deficiency anemia, unspecified: Secondary | ICD-10-CM | POA: Insufficient documentation

## 2017-03-26 DIAGNOSIS — D500 Iron deficiency anemia secondary to blood loss (chronic): Secondary | ICD-10-CM

## 2017-03-26 DIAGNOSIS — G4730 Sleep apnea, unspecified: Secondary | ICD-10-CM | POA: Insufficient documentation

## 2017-03-26 DIAGNOSIS — I10 Essential (primary) hypertension: Secondary | ICD-10-CM | POA: Insufficient documentation

## 2017-03-26 DIAGNOSIS — Z79899 Other long term (current) drug therapy: Secondary | ICD-10-CM | POA: Diagnosis not present

## 2017-03-26 HISTORY — PX: ESOPHAGOGASTRODUODENOSCOPY (EGD) WITH PROPOFOL: SHX5813

## 2017-03-26 HISTORY — DX: Unspecified cirrhosis of liver: K74.60

## 2017-03-26 SURGERY — ESOPHAGOGASTRODUODENOSCOPY (EGD) WITH PROPOFOL
Anesthesia: General

## 2017-03-26 MED ORDER — ACETAMINOPHEN 160 MG/5ML PO SOLN: 325.0000 mg | ORAL | Status: DC | PRN

## 2017-03-26 MED ORDER — PROPOFOL 10 MG/ML IV BOLUS
INTRAVENOUS | Status: DC | PRN
  Administered 2017-03-26: 100 mg via INTRAVENOUS
  Administered 2017-03-26: 20 mg via INTRAVENOUS

## 2017-03-26 MED ORDER — ONDANSETRON HCL 4 MG/2ML IJ SOLN: 4.0000 mg | Freq: Once | INTRAMUSCULAR | Status: DC | PRN

## 2017-03-26 MED ORDER — GLYCOPYRROLATE 0.2 MG/ML IJ SOLN
INTRAMUSCULAR | Status: DC | PRN
  Administered 2017-03-26: 0.1 mg via INTRAVENOUS

## 2017-03-26 MED ORDER — LIDOCAINE HCL (CARDIAC) 20 MG/ML IV SOLN
INTRAVENOUS | Status: DC | PRN
  Administered 2017-03-26: 50 mg via INTRAVENOUS

## 2017-03-26 MED ORDER — LACTATED RINGERS IV SOLN
10.0000 mL/h | INTRAVENOUS | Status: DC
  Administered 2017-03-26: 10 mL/h via INTRAVENOUS

## 2017-03-26 MED ORDER — ACETAMINOPHEN 325 MG PO TABS: 325.0000 mg | ORAL_TABLET | ORAL | Status: DC | PRN

## 2017-03-26 MED ORDER — STERILE WATER FOR IRRIGATION IR SOLN
Status: DC | PRN
  Administered 2017-03-26: 09:00:00

## 2017-03-26 SURGICAL SUPPLY — 32 items
BALLN DILATOR 10-12 8 (BALLOONS)
BALLN DILATOR 12-15 8 (BALLOONS)
BALLN DILATOR 15-18 8 (BALLOONS)
BALLN DILATOR CRE 0-12 8 (BALLOONS)
BALLN DILATOR ESOPH 8 10 CRE (MISCELLANEOUS) IMPLANT
BALLOON DILATOR 12-15 8 (BALLOONS) IMPLANT
BALLOON DILATOR 15-18 8 (BALLOONS) IMPLANT
BALLOON DILATOR CRE 0-12 8 (BALLOONS) IMPLANT
BLOCK BITE 60FR ADLT L/F GRN (MISCELLANEOUS) ×2 IMPLANT
CANISTER SUCT 1200ML W/VALVE (MISCELLANEOUS) ×2 IMPLANT
CLIP HMST 235XBRD CATH ROT (MISCELLANEOUS) IMPLANT
CLIP RESOLUTION 360 11X235 (MISCELLANEOUS)
FCP ESCP3.2XJMB 240X2.8X (MISCELLANEOUS)
FORCEPS BIOP RAD 4 LRG CAP 4 (CUTTING FORCEPS) IMPLANT
FORCEPS BIOP RJ4 240 W/NDL (MISCELLANEOUS)
FORCEPS ESCP3.2XJMB 240X2.8X (MISCELLANEOUS) IMPLANT
GOWN CVR UNV OPN BCK APRN NK (MISCELLANEOUS) ×2 IMPLANT
GOWN ISOL THUMB LOOP REG UNIV (MISCELLANEOUS) ×2
INJECTOR VARIJECT VIN23 (MISCELLANEOUS) IMPLANT
KIT DEFENDO VALVE AND CONN (KITS) IMPLANT
KIT ENDO PROCEDURE OLY (KITS) ×2 IMPLANT
MARKER SPOT ENDO TATTOO 5ML (MISCELLANEOUS) IMPLANT
PAD GROUND ADULT SPLIT (MISCELLANEOUS) IMPLANT
RETRIEVER NET PLAT FOOD (MISCELLANEOUS) IMPLANT
SNARE SHORT THROW 13M SML OVAL (MISCELLANEOUS) IMPLANT
SNARE SHORT THROW 30M LRG OVAL (MISCELLANEOUS) IMPLANT
SPOT EX ENDOSCOPIC TATTOO (MISCELLANEOUS)
SYR INFLATION 60ML (SYRINGE) IMPLANT
TRAP ETRAP POLY (MISCELLANEOUS) IMPLANT
VARIJECT INJECTOR VIN23 (MISCELLANEOUS)
WATER STERILE IRR 250ML POUR (IV SOLUTION) ×2 IMPLANT
WIRE CRE 18-20MM 8CM F G (MISCELLANEOUS) IMPLANT

## 2017-03-26 NOTE — H&P (Signed)
Lucilla Lame, MD [address] [place], [place] [address] Phone:[phone] Fax : [phone]  Primary Care Physician:  Adline Potter, MD Primary Gastroenterologist:  Dr. Allen Norris  Pre-Procedure History & Physical: HPI:  Dylan Ho is a 66 y.o. male is here for an endoscopy.   Past Medical History:  Diagnosis Date  . Anemia   . Arthritis   . CAD (coronary artery disease)    No Stents Present- per patient  . Cirrhosis ([place])    mild  . Colon polyps   . GERD (gastroesophageal reflux disease)   . History of alcohol abuse Last Drink- 2011  . History of gout   . Hypertension    no longer on meds  . Hypothyroidism   . Intestinal ulcer   . Iron deficiency anemia due to chronic blood loss 02/27/2017  . Overactive bladder   . Sleep apnea    improved since gastric bypass  . Thyroid disease     Past Surgical History:  Procedure Laterality Date  . BILATERAL TOTAL SHOULDER ARTHROPLASTY  1990's  . CIRCUMCISION REVISION N/A 03/13/2017   Procedure: CIRCUMCISION REVISION;  Surgeon: Nickie Retort, MD;  Location: [facility] OR[facility];  Service: Urology;  Laterality: N/A;  . COLONOSCOPY WITH PROPOFOL N/A 10/30/2016   Procedure: COLONOSCOPY WITH PROPOFOL;  Surgeon: Lucilla Lame, MD;  Location: [facility];  Service: Endoscopy;  Laterality: N/A;  . ESOPHAGOGASTRODUODENOSCOPY (EGD) WITH PROPOFOL N/A 10/30/2016   Procedure: ESOPHAGOGASTRODUODENOSCOPY (EGD) WITH PROPOFOL;  Surgeon: Lucilla Lame, MD;  Location: [facility];  Service: Endoscopy;  Laterality: N/A;  . GASTRIC BYPASS  2016   Dr. Darnell Level- [place], [place] ARTHROSCOPY  1990s  . KNEE ARTHROSCOPY W/ MENISCAL REPAIR Bilateral   . TONSILLECTOMY  2004    Prior to Admission medications   Medication Sig Start Date End Date Taking? Authorizing Provider  lactulose (CHRONULAC) 10 GM/15ML solution Take 15 mLs (10 g total) by mouth 2 (two) times daily as needed for mild constipation. 02/25/17  Yes Glean Hess, MD    levothyroxine (SYNTHROID, LEVOTHROID) 150 MCG tablet Take 1 tablet (150 mcg total) by mouth daily before breakfast. 02/20/17  Yes Plonk, Gwyndolyn Saxon, MD  Multiple Vitamin (MULTIVITAMIN) capsule Take 1 capsule by mouth daily.   Yes [provider]  pantoprazole (PROTONIX) 40 MG tablet Take 1 tablet (40 mg total) by mouth 2 (two) times daily. 03/18/17  Yes Lucilla Lame, MD  traZODone (DESYREL) 150 MG tablet Take 150 mg by mouth at bedtime.   Yes [provider]  vitamin B-12 (CYANOCOBALAMIN) 500 MCG tablet Take 1,000 mcg by mouth daily.    Yes [provider]  HYDROcodone-acetaminophen (NORCO) 5-325 MG tablet Take 1 tablet by mouth every 4 (four) hours as needed for moderate pain. Patient not taking: Reported on 03/18/2017 03/13/17   Nickie Retort, MD  mirabegron ER (MYRBETRIQ) 50 MG TB24 tablet Take 1 tablet (50 mg total) by mouth daily. Patient not taking: Reported on 03/23/2017 02/10/17   Nickie Retort, MD  zaleplon (SONATA) 5 MG capsule Take 1 capsule (5 mg total) by mouth at bedtime as needed for sleep. Patient not taking: Reported on 03/23/2017 03/11/17   Adline Potter, MD    Allergies as of 03/19/2017  . (No Known Allergies)    Family History  Problem Relation Age of Onset  . Stroke Mother   . Breast cancer Mother   . Heart disease Father   . Stroke Father   . AAA (abdominal aortic  aneurysm) Father   . Lung cancer Paternal Grandmother   . Prostate cancer Neg Hx   . Bladder Cancer Neg Hx   . Kidney cancer Neg Hx     Social History   Social History  . Marital status: Married    Spouse name: N/A  . Number of children: N/A  . Years of education: N/A   Occupational History  . Not on file.   Social History Main Topics  . Smoking status: Never Smoker  . Smokeless tobacco: Current User    Types: Chew  . Alcohol use No     Comment: Heavy ETOH in past- Last Drink 2011 per patient  . Drug use: No  . Sexual activity: Not on file   Other Topics  Concern  . Not on file   Social History Narrative  . No narrative on file    Review of Systems: See HPI, otherwise negative ROS  Physical Exam: Ht 5\' 11"  (1.803 m)   Wt 222 lb (100.7 kg)   BMI 30.96 kg/m  General:   Alert,  pleasant and cooperative in NAD Head:  Normocephalic and atraumatic. Neck:  Supple; no masses or thyromegaly. Lungs:  Clear throughout to auscultation.    Heart:  Regular rate and rhythm. Abdomen:  Soft, nontender and nondistended. Normal bowel sounds, without guarding, and without rebound.   Neurologic:  Alert and  oriented x4;  grossly normal neurologically.  Impression/Plan: Jeren Dufrane Penning is here for an endoscopy to be performed for Anemia and weight loss.  Risks, benefits, limitations, and alternatives regarding  endoscopy have been reviewed with the patient.  Questions have been answered.  All parties agreeable.   Lucilla Lame, MD  03/26/2017, 7:27 AM

## 2017-03-26 NOTE — Anesthesia Preprocedure Evaluation (Signed)
Anesthesia Evaluation  Patient identified by MRN, date of birth, ID band Patient awake    Reviewed: Allergy & Precautions, NPO status   Airway Mallampati: II  TM Distance: >3 FB     Dental  (+) Poor Dentition   Pulmonary sleep apnea (improved after gastric bypass) ,    breath sounds clear to auscultation       Cardiovascular hypertension, + CAD   Rhythm:Regular Rate:Normal     Neuro/Psych    GI/Hepatic GERD  ,  Endo/Other  Hypothyroidism BMI 31   Renal/GU      Musculoskeletal  (+) Arthritis ,   Abdominal   Peds  Hematology  (+) anemia ,   Anesthesia Other Findings   Reproductive/Obstetrics                            Anesthesia Physical Anesthesia Plan  ASA: III  Anesthesia Plan: General   Post-op Pain Management:    Induction: Intravenous  PONV Risk Score and Plan:   Airway Management Planned: Natural Airway and Nasal Cannula  Additional Equipment:   Intra-op Plan:   Post-operative Plan:   Informed Consent: I have reviewed the patients History and Physical, chart, labs and discussed the procedure including the risks, benefits and alternatives for the proposed anesthesia with the patient or authorized representative who has indicated his/her understanding and acceptance.     Plan Discussed with: CRNA  Anesthesia Plan Comments:         Anesthesia Quick Evaluation

## 2017-03-26 NOTE — Transfer of Care (Signed)
Immediate Anesthesia Transfer of Care Note  Patient: Dylan Ho  Procedure(s) Performed: Procedure(s) with comments: ESOPHAGOGASTRODUODENOSCOPY (EGD) WITH PROPOFOL (N/A) - requests early  Patient Location: PACU  Anesthesia Type: General  Level of Consciousness: awake, alert  and patient cooperative  Airway and Oxygen Therapy: Patient Spontanous Breathing and Patient connected to supplemental oxygen  Post-op Assessment: Post-op Vital signs reviewed, Patient's Cardiovascular Status Stable, Respiratory Function Stable, Patent Airway and No signs of Nausea or vomiting  Post-op Vital Signs: Reviewed and stable  Complications: No apparent anesthesia complications

## 2017-03-26 NOTE — Op Note (Signed)
[facility] Patient Name: Dylan Ho Procedure Date: 03/26/2017 8:26 AM MRN: [id number] Account #: [id number] Date of Birth: [birth year] Admit Type: Outpatient Age: 66 Room: [facility] OR ROOM 01 Gender: Male Note Status: Finalized Procedure:            Upper GI endoscopy Indications:          Iron deficiency anemia Providers:            Lucilla Lame MD, MD Referring MD:         Satira Anis. Plonk, MD (Referring MD) Medicines:            Propofol per Anesthesia Complications:        No immediate complications. Procedure:            Pre-Anesthesia Assessment:                       - Prior to the procedure, a History and Physical was                        performed, and patient medications and allergies were                        reviewed. The patient's tolerance of previous                        anesthesia was also reviewed. The risks and benefits of                        the procedure and the sedation options and risks were                        discussed with the patient. All questions were                        answered, and informed consent was obtained. Prior                        Anticoagulants: The patient has taken no previous                        anticoagulant or antiplatelet agents. ASA Grade                        Assessment: II - A patient with mild systemic disease.                        After reviewing the risks and benefits, the patient was                        deemed in satisfactory condition to undergo the                        procedure.                       After obtaining informed consent, the endoscope was                        passed under direct vision. Throughout the procedure,  the patient's blood pressure, pulse, and oxygen                        saturations were monitored continuously. The Olympus                        190 Endoscope [id number]) was introduced through the                         mouth, and advanced to the jejunum. The upper GI                        endoscopy was accomplished without difficulty. The                        patient tolerated the procedure well. Findings:      A small hiatal hernia was present.      Evidence of a gastric bypass was found. A gastric pouch was found. The       gastrojejunal anastomosis was characterized by healthy appearing mucosa.       This was traversed. The pouch-to-jejunum limb was characterized by       healthy appearing mucosa. The jejunojejunal anastomosis was       characterized by healthy appearing mucosa. The duodenum-to-jejunum limb       was examined.      The examined jejunum was normal. Impression:           - Small hiatal hernia.                       - Gastric bypass. Gastrojejunal anastomosis                        characterized by healthy appearing mucosa.                       - Normal examined jejunum.                       - No specimens collected. Recommendation:       - Discharge patient to home.                       - Resume previous diet.                       - Continue present medications. Procedure Code(s):    --- Professional ---                       [phone], Esophagogastroduodenoscopy, flexible, transoral;                        diagnostic, including collection of specimen(s) by                        brushing or washing, when performed (separate procedure) Diagnosis Code(s):    --- Professional ---                       D50.9, Iron deficiency anemia, unspecified                       Z98.84, Bariatric surgery status  CPT copyright 2016 American Medical [employer]. All rights reserved. The codes documented in this report are preliminary and upon coder review may  be revised to meet current compliance requirements. Lucilla Lame MD, MD 03/26/2017 8:41:59 AM This report has been signed electronically. Number of Addenda: 0 Note Initiated On: 03/26/2017 8:26 AM      [facility]

## 2017-03-26 NOTE — Anesthesia Procedure Notes (Signed)
Date/Time: 03/26/2017 8:31 AM Performed by: Cameron Ali Pre-anesthesia Checklist: Patient identified, Emergency Drugs available, Suction available, Timeout performed and Patient being monitored Patient Re-evaluated:Patient Re-evaluated prior to induction Oxygen Delivery Method: Nasal cannula Placement Confirmation: positive ETCO2

## 2017-03-26 NOTE — Anesthesia Postprocedure Evaluation (Signed)
Anesthesia Post Note  Patient: Dylan Ho  Procedure(s) Performed: Procedure(s) (LRB): ESOPHAGOGASTRODUODENOSCOPY (EGD) WITH PROPOFOL (N/A)  Patient location during evaluation: PACU Anesthesia Type: General Level of consciousness: awake Pain management: pain level controlled Vital Signs Assessment: post-procedure vital signs reviewed and stable Respiratory status: respiratory function stable Cardiovascular status: stable Postop Assessment: no signs of nausea or vomiting Anesthetic complications: no    Veda Canning

## 2017-03-27 ENCOUNTER — Encounter: Payer: Self-pay | Admitting: Gastroenterology

## 2017-03-27 ENCOUNTER — Inpatient Hospital Stay: Payer: BLUE CROSS/BLUE SHIELD

## 2017-03-27 ENCOUNTER — Encounter: Payer: Self-pay | Admitting: Family Medicine

## 2017-03-30 ENCOUNTER — Inpatient Hospital Stay: Payer: BLUE CROSS/BLUE SHIELD

## 2017-03-30 ENCOUNTER — Telehealth: Payer: Self-pay

## 2017-03-30 ENCOUNTER — Other Ambulatory Visit: Payer: Self-pay | Admitting: Family Medicine

## 2017-03-30 VITALS — BP 130/78 | HR 57 | Temp 95.9°F | Resp 18

## 2017-03-30 DIAGNOSIS — D61818 Other pancytopenia: Secondary | ICD-10-CM | POA: Diagnosis not present

## 2017-03-30 DIAGNOSIS — D500 Iron deficiency anemia secondary to blood loss (chronic): Secondary | ICD-10-CM

## 2017-03-30 MED ORDER — IRON SUCROSE 20 MG/ML IV SOLN
INTRAVENOUS | Status: AC
  Filled 2017-03-30: qty 10

## 2017-03-30 MED ORDER — IRON SUCROSE 20 MG/ML IV SOLN
200.0000 mg | Freq: Once | INTRAVENOUS | Status: AC
  Administered 2017-03-30: 200 mg via INTRAVENOUS
  Filled 2017-03-30: qty 10

## 2017-03-30 MED ORDER — TRAZODONE HCL 150 MG PO TABS: 300.0000 mg | ORAL_TABLET | Freq: Every day | ORAL | 3 refills | Status: DC

## 2017-03-30 MED ORDER — SODIUM CHLORIDE 0.9 % IV SOLN
Freq: Once | INTRAVENOUS | Status: AC
  Administered 2017-03-30: 10:00:00 via INTRAVENOUS
  Filled 2017-03-30: qty 1000

## 2017-03-30 NOTE — Patient Instructions (Signed)

## 2017-03-30 NOTE — Telephone Encounter (Signed)
Rx sent 

## 2017-03-30 NOTE — Telephone Encounter (Signed)
Patient now taking 2 Trazodone at night and this is working. Can you send in refill for those directions? Patient will check with pharmacy later today or I will call if we can not honor.

## 2017-04-02 ENCOUNTER — Ambulatory Visit (INDEPENDENT_AMBULATORY_CARE_PROVIDER_SITE_OTHER): Payer: BLUE CROSS/BLUE SHIELD | Admitting: Urology

## 2017-04-02 ENCOUNTER — Encounter: Payer: Self-pay | Admitting: Urology

## 2017-04-02 VITALS — BP 93/62 | HR 69 | Ht 72.0 in | Wt 217.0 lb

## 2017-04-02 DIAGNOSIS — N471 Phimosis: Secondary | ICD-10-CM

## 2017-04-02 NOTE — Progress Notes (Signed)
04/02/2017 9:51 AM   Dylan Ho July 12, 1950 [id number]  Referring provider: Adline Potter, MD [address] [place],  [id number]  Chief Complaint  Patient presents with  . 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 It was a difficult operation that required use of penile scrotal skin to completely cover his penis.  He has done well since surgery. He has had to wipe femoris exudate off the tip of his penis on a daily basis this is improving. He stopped taking his medication for overactive bladder since his problem resolved after surgery. He is very happy with the results of surgery. He is able to piece standing up for the first time in many years.   PMH: Past Medical History:  Diagnosis Date  . Anemia   . Arthritis   . CAD (coronary artery disease)    No Stents Present- per patient  . 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 Colon polyps   . GERD (gastroesophageal reflux disease)   . History of alcohol abuse Last Drink- 2011  . History of gout   . Hypertension    no longer on meds  . Hypothyroidism   . Intestinal ulcer   . Iron deficiency anemia due to chronic blood loss 02/27/2017  . Overactive bladder   . Sleep apnea    improved since gastric bypass  . Thyroid disease     Surgical History: Past Surgical History:  Procedure Laterality Date  . BILATERAL TOTAL SHOULDER ARTHROPLASTY  1990's  . CIRCUMCISION REVISION N/A 03/13/2017   Procedure: CIRCUMCISION REVISION;  Surgeon: Nickie Retort, MD;  Location: [facility] ORS;  Service: Urology;  Laterality: N/A;  . COLONOSCOPY WITH PROPOFOL N/A 10/30/2016   Procedure: COLONOSCOPY WITH PROPOFOL;  Surgeon: Lucilla Lame, MD;  Location: [facility] ENDOSCOPY;  Service: Endoscopy;  Laterality: N/A;  . ESOPHAGOGASTRODUODENOSCOPY (EGD) WITH PROPOFOL N/A 10/30/2016   Procedure:  ESOPHAGOGASTRODUODENOSCOPY (EGD) WITH PROPOFOL;  Surgeon: Lucilla Lame, MD;  Location: [facility] ENDOSCOPY;  Service: Endoscopy;  Laterality: N/A;  . ESOPHAGOGASTRODUODENOSCOPY (EGD) WITH PROPOFOL N/A 03/26/2017   Procedure: ESOPHAGOGASTRODUODENOSCOPY (EGD) WITH PROPOFOL;  Surgeon: Lucilla Lame, MD;  Location: [facility];  Service: Gastroenterology;  Laterality: N/A;  requests early  . GASTRIC BYPASS  2016   Dr. Darnell Level- [facility], [place] ARTHROSCOPY  1990s  . KNEE ARTHROSCOPY W/ MENISCAL REPAIR Bilateral   . TONSILLECTOMY  2004    Home Medications:  Allergies as of 04/02/2017   No Known Allergies     Medication List       Accurate as of 04/02/17  9:51 AM. Always use your most recent med list.          lactulose 10 GM/15ML solution Commonly known as:  CHRONULAC Take 15 mLs (10 g total) by mouth 2 (two) times daily as needed for mild constipation.   levothyroxine 150 MCG tablet Commonly known as:  SYNTHROID, LEVOTHROID Take 1 tablet (150 mcg total) by mouth daily before breakfast.   mirabegron ER 50 MG Tb24 tablet Commonly known as:  MYRBETRIQ Take 1 tablet (50 mg total) by mouth daily.   multivitamin capsule Take 1 capsule by mouth daily.   pantoprazole 40 MG tablet Commonly known as:  PROTONIX Take 1 tablet (40 mg total) by mouth 2 (two) times daily.   traZODone 150 MG tablet Commonly  known as:  DESYREL Take 2 tablets (300 mg total) by mouth at bedtime.   vitamin B-12 500 MCG tablet Commonly known as:  CYANOCOBALAMIN Take 1,000 mcg by mouth daily.       Allergies: No Known Allergies  Family History: Family History  Problem Relation Age of Onset  . Stroke Mother   . Breast cancer Mother   . Heart disease Father   . Stroke Father   . AAA (abdominal aortic aneurysm) Father   . Lung cancer Paternal Grandmother   . Prostate cancer Neg Hx   . Bladder Cancer Neg Hx   . Kidney cancer Neg Hx     Social History:  reports that he has never smoked. His smokeless  tobacco use includes Chew. He reports that he does not drink alcohol or use drugs.  ROS: UROLOGY Frequent Urination?: Yes Hard to postpone urination?: No Burning/pain with urination?: No Get up at night to urinate?: Yes Leakage of urine?: No Urine stream starts and stops?: No Trouble starting stream?: No Do you have to strain to urinate?: No Blood in urine?: No Urinary tract infection?: No Sexually transmitted disease?: No Injury to kidneys or bladder?: No Painful intercourse?: No Weak stream?: No Erection problems?: No Penile pain?: No  Gastrointestinal Nausea?: No Vomiting?: No Indigestion/heartburn?: No Diarrhea?: No Constipation?: No  Constitutional Fever: No Night sweats?: No Weight loss?: No Fatigue?: No  Skin Skin rash/lesions?: No Itching?: No  Eyes Blurred vision?: No Double vision?: No  Ears/Nose/Throat Sore throat?: No Sinus problems?: No  Hematologic/Lymphatic Swollen glands?: No Easy bruising?: No  Cardiovascular Leg swelling?: No Chest pain?: No  Respiratory Cough?: No Shortness of breath?: No  Endocrine Excessive thirst?: No  Musculoskeletal Back pain?: No Joint pain?: No  Neurological Headaches?: No Dizziness?: Yes  Psychologic Depression?: No Anxiety?: No  Physical Exam: BP 93/62   Pulse 69   Ht 6' (1.829 m)   Wt 217 lb (98.4 kg)   BMI 29.43 kg/m   Constitutional:  Alert and oriented, No acute distress. HEENT: [place] AT, moist mucus membranes.  Trachea midline, no masses. Cardiovascular: No clubbing, cyanosis, or edema. Respiratory: Normal respiratory effort, no increased work of breathing. GI: Abdomen is soft, nontender, nondistended, no abdominal masses GU: No CVA tenderness. Overall incisions of the penis are healing well. He does have fibrinous exudate. There is a small opening that is not infected at the 12:00 position at the distal penile shaft just before the glans. Approximately 2 mm in size. There is some  fibrous exudate from this. There is no purulent drainage. Overall incisions are healing well. Skin: No rashes, bruises or suspicious lesions. Lymph: No cervical or inguinal adenopathy. Neurologic: Grossly intact, no focal deficits, moving all 4 extremities. Psychiatric: Normal mood and affect.  Laboratory Data: Lab Results  Component Value Date   WBC 3.5 (L) 02/26/2017   HGB 11.7 (L) 02/26/2017   HCT 36.0 (L) 02/26/2017   MCV 85.9 02/26/2017   PLT 120 (L) 02/26/2017    Lab Results  Component Value Date   CREATININE 0.83 02/26/2017    No results found for: PSA  No results found for: TESTOSTERONE  Lab Results  Component Value Date   HGBA1C 4.7 (L) 12/12/2016    Urinalysis    Component Value Date/Time   COLORURINE YELLOW (A) 10/05/2016 0211   APPEARANCEUR Clear 02/10/2017 0850   LABSPEC 1.012 10/05/2016 0211   LABSPEC 1.008 07/15/2013 1327   PHURINE 6.0 10/05/2016 0211   GLUCOSEU Negative 02/10/2017 0850  GLUCOSEU Negative 07/15/2013 1327   HGBUR NEGATIVE 10/05/2016 0211   BILIRUBINUR Negative 02/10/2017 0850   BILIRUBINUR Negative 07/15/2013 1327   KETONESUR NEGATIVE 10/05/2016 0211   PROTEINUR Negative 02/10/2017 0850   PROTEINUR NEGATIVE 10/05/2016 0211   NITRITE Negative 02/10/2017 0850   NITRITE NEGATIVE 10/05/2016 0211   LEUKOCYTESUR 1+ (A) 02/10/2017 0850   LEUKOCYTESUR 2+ 07/15/2013 1327     Assessment & Plan:    1. Secondary phimosis 2. Buried penis The patient is doing well after his complex revision of circumcision. He is happy with the results. Due to the fibrinous exudate and the significant difficulty of the repair, he'll follow-up in 3 months to ensure that his wound continues to heal well. He no longer needs overactive bladder medications at this time since his symptoms have resolved since surgery.  No Follow-up on file.  Nickie Retort, MD  [facility] [address] [place], [place] [address] [phone]

## 2017-04-06 ENCOUNTER — Ambulatory Visit: Payer: BLUE CROSS/BLUE SHIELD | Admitting: Oncology

## 2017-04-09 ENCOUNTER — Inpatient Hospital Stay: Payer: BLUE CROSS/BLUE SHIELD

## 2017-04-09 ENCOUNTER — Encounter: Payer: Self-pay | Admitting: Oncology

## 2017-04-09 ENCOUNTER — Inpatient Hospital Stay: Payer: BLUE CROSS/BLUE SHIELD | Attending: Oncology | Admitting: Oncology

## 2017-04-09 VITALS — BP 106/68 | HR 61 | Temp 96.9°F | Wt 215.2 lb

## 2017-04-09 DIAGNOSIS — Z79899 Other long term (current) drug therapy: Secondary | ICD-10-CM

## 2017-04-09 DIAGNOSIS — D500 Iron deficiency anemia secondary to blood loss (chronic): Secondary | ICD-10-CM

## 2017-04-09 DIAGNOSIS — K449 Diaphragmatic hernia without obstruction or gangrene: Secondary | ICD-10-CM

## 2017-04-09 DIAGNOSIS — K8020 Calculus of gallbladder without cholecystitis without obstruction: Secondary | ICD-10-CM

## 2017-04-09 DIAGNOSIS — Z801 Family history of malignant neoplasm of trachea, bronchus and lung: Secondary | ICD-10-CM

## 2017-04-09 DIAGNOSIS — I2510 Atherosclerotic heart disease of native coronary artery without angina pectoris: Secondary | ICD-10-CM

## 2017-04-09 DIAGNOSIS — K219 Gastro-esophageal reflux disease without esophagitis: Secondary | ICD-10-CM | POA: Insufficient documentation

## 2017-04-09 DIAGNOSIS — K648 Other hemorrhoids: Secondary | ICD-10-CM | POA: Insufficient documentation

## 2017-04-09 DIAGNOSIS — R5383 Other fatigue: Secondary | ICD-10-CM

## 2017-04-09 DIAGNOSIS — D731 Hypersplenism: Secondary | ICD-10-CM | POA: Insufficient documentation

## 2017-04-09 DIAGNOSIS — D61818 Other pancytopenia: Secondary | ICD-10-CM

## 2017-04-09 DIAGNOSIS — Z9884 Bariatric surgery status: Secondary | ICD-10-CM | POA: Diagnosis not present

## 2017-04-09 DIAGNOSIS — N3281 Overactive bladder: Secondary | ICD-10-CM

## 2017-04-09 DIAGNOSIS — K7469 Other cirrhosis of liver: Secondary | ICD-10-CM

## 2017-04-09 DIAGNOSIS — E039 Hypothyroidism, unspecified: Secondary | ICD-10-CM

## 2017-04-09 DIAGNOSIS — D649 Anemia, unspecified: Secondary | ICD-10-CM

## 2017-04-09 DIAGNOSIS — I10 Essential (primary) hypertension: Secondary | ICD-10-CM | POA: Diagnosis not present

## 2017-04-09 DIAGNOSIS — Z803 Family history of malignant neoplasm of breast: Secondary | ICD-10-CM

## 2017-04-09 DIAGNOSIS — D696 Thrombocytopenia, unspecified: Secondary | ICD-10-CM

## 2017-04-09 DIAGNOSIS — M1990 Unspecified osteoarthritis, unspecified site: Secondary | ICD-10-CM | POA: Diagnosis not present

## 2017-04-09 DIAGNOSIS — R1013 Epigastric pain: Secondary | ICD-10-CM | POA: Diagnosis not present

## 2017-04-09 DIAGNOSIS — D72819 Decreased white blood cell count, unspecified: Secondary | ICD-10-CM

## 2017-04-09 DIAGNOSIS — K7460 Unspecified cirrhosis of liver: Secondary | ICD-10-CM | POA: Insufficient documentation

## 2017-04-09 LAB — COMPREHENSIVE METABOLIC PANEL
ALT: 14 U/L — ABNORMAL LOW (ref 17–63)
AST: 22 U/L (ref 15–41)
Albumin: 4 g/dL (ref 3.5–5.0)
Alkaline Phosphatase: 68 U/L (ref 38–126)
Anion gap: 9 (ref 5–15)
BUN: 9 mg/dL (ref 6–20)
CO2: 27 mmol/L (ref 22–32)
Calcium: 8.8 mg/dL — ABNORMAL LOW (ref 8.9–10.3)
Chloride: 105 mmol/L (ref 101–111)
Creatinine, Ser: 0.86 mg/dL (ref 0.61–1.24)
GFR calc Af Amer: >60 mL/min (ref >60)
GFR calc non Af Amer: >60 mL/min (ref >60)
Glucose, Bld: 94 mg/dL (ref 65–99)
Potassium: 3.7 mmol/L (ref 3.5–5.1)
Sodium: 141 mmol/L (ref 135–145)
Total Bilirubin: 0.6 mg/dL (ref 0.3–1.2)
Total Protein: 6.2 g/dL — ABNORMAL LOW (ref 6.5–8.1)

## 2017-04-09 LAB — IRON AND TIBC
Iron: 75 ug/dL (ref 45–182)
Saturation Ratios: 25 % (ref 17.9–39.5)
TIBC: 296 ug/dL (ref 250–450)
UIBC: 221 ug/dL

## 2017-04-09 LAB — FERRITIN: Ferritin: 47 ng/mL (ref 24–336)

## 2017-04-09 LAB — CBC WITH DIFFERENTIAL/PLATELET
Basophils Absolute: 0 10*3/uL (ref 0–0.1)
Basophils Relative: 1 %
Eosinophils Absolute: 0.1 10*3/uL (ref 0–0.7)
Eosinophils Relative: 3 %
HCT: 37.5 % — ABNORMAL LOW (ref 40.0–52.0)
Hemoglobin: 12.6 g/dL — ABNORMAL LOW (ref 13.0–18.0)
Lymphocytes Relative: 29 %
Lymphs Abs: 0.7 10*3/uL — ABNORMAL LOW (ref 1.0–3.6)
MCH: 28 pg (ref 26.0–34.0)
MCHC: 33.6 g/dL (ref 32.0–36.0)
MCV: 83.5 fL (ref 80.0–100.0)
Monocytes Absolute: 0.2 10*3/uL (ref 0.2–1.0)
Monocytes Relative: 7 %
Neutro Abs: 1.5 10*3/uL (ref 1.4–6.5)
Neutrophils Relative %: 60 %
Platelets: 88 10*3/uL — ABNORMAL LOW (ref 150–440)
RBC: 4.49 MIL/uL (ref 4.40–5.90)
RDW: 18.5 % — ABNORMAL HIGH (ref 11.5–14.5)
WBC: 2.6 10*3/uL — ABNORMAL LOW (ref 3.8–10.6)

## 2017-04-09 NOTE — Progress Notes (Signed)
*   Hematology/Oncology Follow Up Visit note [facility] [phone] [phone]   Patient Care Team: Adline Potter, MD as PCP - General (Family Medicine)  REASON FOR VISIT Follow up for management of iron deficiency anemia and pancytopenia  HISTORY OF PRESENTING ILLNESS/PERTINENT HEMATOLOGY HISTORY Dylan Ho 66 y.o.  male with past medical history listed as belowpresents for follow up management of patient's iron deficiency and pancytopenia.  Patient denies any unintentional weight loss, fever/chills, weakness.Patient has a history of gastric bypass. Patient was hospitalized this April due to hematemesis. EGD showed anastomosis ulcer. Patient started on PPI.  Recent EGD on 03/26/2017 showed no active bleeding, healthy appearance of gastrojejunal anastomosis.   Epigastric pain: improved.  Fatigue: still feels profound fatigue, worsen at the end of the day, not improved with resting or taking naps. No appetite.  Weight loss: he continues to have weight loss after gastric bypass surgery 3 years ago and he loses about 1-2 pounds per week. He reports not eating well and having no appetite.   ROS:  Review of Systems  Constitutional: Positive for fatigue.  HENT:  Negative.   Eyes: Negative.   Respiratory: Negative.   Cardiovascular: Negative.   Gastrointestinal: Negative.   Endocrine: Negative.   Genitourinary: Negative.    Musculoskeletal: Negative.   Skin: Negative.   Neurological: Negative.   Hematological: Negative.   Psychiatric/Behavioral: Negative.     MEDICAL HISTORY:  Past Medical History:  Diagnosis Date  . Anemia   . Arthritis   . CAD (coronary artery disease)    No Stents Present- per patient  . Cirrhosis ([place])    mild  . Colon polyps   . GERD (gastroesophageal reflux disease)   . History of alcohol abuse Last Drink- 2011  . History of gout   . Hypertension    no longer on meds  . Hypothyroidism   . Intestinal  ulcer   . Iron deficiency anemia due to chronic blood loss 02/27/2017  . Overactive bladder   . Sleep apnea    improved since gastric bypass  . Thyroid disease     SURGICAL HISTORY: Past Surgical History:  Procedure Laterality Date  . BILATERAL TOTAL SHOULDER ARTHROPLASTY  1990's  . CIRCUMCISION REVISION N/A 03/13/2017   Procedure: CIRCUMCISION REVISION;  Surgeon: Nickie Retort, MD;  Location: [facility];  Service: Urology;  Laterality: N/A;  . COLONOSCOPY WITH PROPOFOL N/A 10/30/2016   Procedure: COLONOSCOPY WITH PROPOFOL;  Surgeon: Lucilla Lame, MD;  Location: [facility] END[facility];  Service: Endoscopy;  Laterality: N/A;  . ESOPHAGOGASTRODUODENOSCOPY (EGD) WITH PROPOFOL N/A 10/30/2016   Procedure: ESOPHAGOGASTRODUODENOSCOPY (EGD) WITH PROPOFOL;  Surgeon: Lucilla Lame, MD;  Location: [facility];  Service: Endoscopy;  Laterality: N/A;  . ESOPHAGOGASTRODUODENOSCOPY (EGD) WITH PROPOFOL N/A 03/26/2017   Procedure: ESOPHAGOGASTRODUODENOSCOPY (EGD) WITH PROPOFOL;  Surgeon: Lucilla Lame, MD;  Location: [place];  Service: Gastroenterology;  Laterality: N/A;  requests early  . GASTRIC BYPASS  2016   Dr. Darnell Level- [facility], [place] ARTHROSCOPY  1990s  . KNEE ARTHROSCOPY W/ MENISCAL REPAIR Bilateral   . TONSILLECTOMY  2004    SOCIAL HISTORY: Social History   Social History  . Marital status: Married    Spouse name: N/A  . Number of children: N/A  . Years of education: N/A   Occupational History  . Not on file.   Social History Main Topics  . Smoking status: Never Smoker  . Smokeless tobacco: Current User    Types: Chew  .  Alcohol use No     Comment: Heavy ETOH in past- Last Drink 2011 per patient  . Drug use: No  . Sexual activity: Not on file   Other Topics Concern  . Not on file   Social History Narrative  . No narrative on file    FAMILY HISTORY: Family History  Problem Relation Age of Onset  . Stroke Mother   . Breast cancer Mother   . Heart disease Father   .  Stroke Father   . AAA (abdominal aortic aneurysm) Father   . Lung cancer Paternal Grandmother   . Prostate cancer Neg Hx   . Bladder Cancer Neg Hx   . Kidney cancer Neg Hx     ALLERGIES:  has No Known Allergies.  MEDICATIONS:  Current Outpatient Prescriptions  Medication Sig Dispense Refill  . lactulose (CHRONULAC) 10 GM/15ML solution Take 15 mLs (10 g total) by mouth 2 (two) times daily as needed for mild constipation. 950 mL 2  . levothyroxine (SYNTHROID, LEVOTHROID) 150 MCG tablet Take 1 tablet (150 mcg total) by mouth daily before breakfast. 30 tablet 2  . mirabegron ER (MYRBETRIQ) 50 MG TB24 tablet Take 1 tablet (50 mg total) by mouth daily. 30 tablet 11  . Multiple Vitamin (MULTIVITAMIN) capsule Take 1 capsule by mouth daily.    . pantoprazole (PROTONIX) 40 MG tablet Take 1 tablet (40 mg total) by mouth 2 (two) times daily. 60 tablet 2  . traZODone (DESYREL) 150 MG tablet Take 2 tablets (300 mg total) by mouth at bedtime. 180 tablet 3  . vitamin B-12 (CYANOCOBALAMIN) 500 MCG tablet Take 1,000 mcg by mouth daily.      No current facility-administered medications for this visit.     PHYSICAL EXAMINATION: ECOG PERFORMANCE STATUS: 0 - Asymptomatic Vitals:   04/09/17 1010  BP: 106/68  Pulse: 61  Temp: (!) 96.9 F (36.1 C)   Filed Weights   04/09/17 1010  Weight: 215 lb 2.7 oz (97.6 kg)    GENERAL:  Alert, no distress and comfortable.  EYES: no pallor or icterus OROPHARYNX: no thrush or ulceration; good dentition  NECK: supple, no masses felt LYMPH:  no palpable lymphadenopathy in the cervical, axillary or inguinal regions LUNGS: clear to auscultation and  No wheeze or crackles HEART/CVS: regular rate & rhythm and no murmurs; No lower extremity edema ABDOMEN: abdomen soft, epigastric tender and normal bowel sounds Musculoskeletal:no cyanosis of digits and no clubbing  PSYCH: alert & oriented x 3  NEURO: no focal motor/sensory deficits SKIN:  no rashes or significant  lesions  LABORATORY DATA:  I have reviewed the data as listed Lab Results  Component Value Date   WBC 3.5 (L) 02/26/2017   HGB 11.7 (L) 02/26/2017   HCT 36.0 (L) 02/26/2017   MCV 85.9 02/26/2017   PLT 120 (L) 02/26/2017    Recent Labs  12/12/16 1041 02/19/17 1111 02/26/17 1135  NA 142 144 140  K 4.9 4.5 4.1  CL 109* 106 105  CO2 20 24 32  GLUCOSE 84 86 93  BUN 23 10 12   CREATININE 1.02 0.84 0.83  CALCIUM 9.0 8.8 9.1  GFRNONAA 77 93 >60  GFRAA 89 107 >60  PROT 6.2 6.0 6.9  ALBUMIN 4.4 4.3 4.4  AST 26 31 34  ALT 15 21 24   ALKPHOS 70 83 81  BILITOT 0.4 0.4 0.9   Results for Dylan, Ho (MRN [id number]) as of 02/26/2017 11:12  Ref. Range 12/12/2016 10:41 01/09/2017 10:36 02/09/2017 10:35  02/19/2017 11:11  TSH Latest Ref Range: 0.450 - 4.500 uIU/mL 25.950 (H) 0.195 (L) 0.378 (L) 0.188 (L)   Iron/TIBC/Ferritin/ %Sat    Component Value Date/Time   IRON 24 (L) 02/26/2017 1135   TIBC 427 02/26/2017 1135   FERRITIN 4 (L) 02/26/2017 1135   IRONPCTSAT 6 (L) 02/26/2017 1135   03/06/2017 Flow cytometry is negative for monoclonal process.  03/03/2017 Stool occult blood positive x 2 Recent B12 level was normal. RADIOGRAPHIC STUDIES: I have personally reviewed the radiological images as listed and agreed with the findings in the report.  CT 10/05/2016 IMPRESSION: 1. Increased attenuation in the small bowel mesentery, which is new since the prior CT. This has an appearance consistent with mesenteric panniculitis. 2. No other acute abnormalities or changes from the prior study. 3. Cirrhosis with borderline splenomegaly. 4. Small gallstone.  No acute cholecystitis. 5. Stable changes from previous gastric bypass surgery.  10/30/2016 colonoscopy and EGD showed Normal esophagus. - Gastric bypass with a normal-sized pouch and intact staple line. Gastrojejunal anastomosis characterized by ulceration. - Normal examined jejunum. The perianal and digital rectal examinations were  normal. Bleeding internal hemorrhoids were found. The hemorrhoids were Grade II   03/03/2017 IMPRESSION: 1. Multiple gallstones. The gallbladder is distended. Gallbladder wall thickness normal. Negative Murphy sign. No biliary distention. 2. Heterogeneous hepatic parenchymal pattern consistent with fatty infiltration and/or hepatocellular disease in this patient with known cirrhosis. No focal hepatic abnormality. Portal vein is patent with normal direction of blood flow. No splenomegaly. 3. 2 cm cyst with thin septation again noted in the right kidney. This is consistent with a benign cyst.  03/26/2017 EGD - Small hiatal hernia. - Gastric bypass. Gastrojejunal anastomosis characterized by healthy appearing mucosa. - Normal examined jejunum. - No specimens collected  ASSESSMENT & PLAN:  1. Iron deficiency anemia due to chronic blood loss   2. Normocytic anemia   3. Thrombocytopenia ([place])   4. Leukopenia, unspecified type   5. Other cirrhosis of liver ([place])   6. Other pancytopenia ([place])   7. H/O gastric bypass   8. Fatigue, unspecified type    # Normocytic anemia with iron deficiency from chronic blood loss.    s/p IV Venofer x 4 doses. Awaiting iron panel to determine whether additional IV iron needed.  # chronic pancytopenia. Leukopenia and thrombocytopenia likely are secondary to chronic liver disease/cirrhosis/hypersplenism, however, a bone marrow cause can not be excluded. His fatigue has not improved after improvement of iron supplementation. Platelet decreases further. Will proceed bone marrow biopsy.  # Liver cirrhosis: compensated. Follow up with GI.   # Fatigue: pending work up. # weight loss, secondary to gastric bypass and poor appetite.   all questions were answered. The patient knows to call the clinic with any problems questions or concerns.  Return of visit:  2 weeks after bone marrow biopsy, with repeat cbc, iron tibc ferritin. Thank you for this kind referral and the  opportunity to participate in the care of this patient. A copy of today's note is routed to referring provider    Earlie Server, MD, PhD Hematology Oncology [facility] at [facility] Pager- [phone] 04/09/2017

## 2017-04-09 NOTE — Progress Notes (Signed)
Patient here today for follow up.  Patient states he has no energy

## 2017-04-10 ENCOUNTER — Encounter: Payer: Self-pay | Admitting: *Deleted

## 2017-04-10 ENCOUNTER — Other Ambulatory Visit: Payer: Self-pay | Admitting: Oncology

## 2017-04-10 ENCOUNTER — Telehealth: Payer: Self-pay | Admitting: Oncology

## 2017-04-10 DIAGNOSIS — D61818 Other pancytopenia: Secondary | ICD-10-CM

## 2017-04-10 NOTE — Telephone Encounter (Signed)
CT Biopsy appt conf with patient.  Patient is also requesting a call regarding his lab results. HHe was seen in [place] yesterday. Staff msg sent to Dr Tasia Catchings & Almyra Free.

## 2017-04-13 ENCOUNTER — Encounter: Payer: Self-pay | Admitting: Oncology

## 2017-04-13 ENCOUNTER — Encounter: Payer: Self-pay | Admitting: *Deleted

## 2017-04-14 ENCOUNTER — Other Ambulatory Visit: Payer: Self-pay | Admitting: Student

## 2017-04-14 ENCOUNTER — Other Ambulatory Visit: Payer: Self-pay | Admitting: Radiology

## 2017-04-14 ENCOUNTER — Encounter: Payer: Self-pay | Admitting: *Deleted

## 2017-04-15 ENCOUNTER — Other Ambulatory Visit (HOSPITAL_COMMUNITY)
Admission: RE | Admit: 2017-04-15 | Discharge: 2017-04-15 | Disposition: A | Discharge status: Home / Self Care | Payer: BLUE CROSS/BLUE SHIELD | Source: Ambulatory Visit | Attending: Oncology | Admitting: Oncology

## 2017-04-15 ENCOUNTER — Ambulatory Visit
Admission: RE | Admit: 2017-04-15 | Discharge: 2017-04-15 | Disposition: A | Discharge status: Home / Self Care | Payer: BLUE CROSS/BLUE SHIELD | Source: Ambulatory Visit | Attending: Oncology | Admitting: Oncology

## 2017-04-15 DIAGNOSIS — Z801 Family history of malignant neoplasm of trachea, bronchus and lung: Secondary | ICD-10-CM | POA: Diagnosis not present

## 2017-04-15 DIAGNOSIS — D61818 Other pancytopenia: Secondary | ICD-10-CM | POA: Diagnosis present

## 2017-04-15 DIAGNOSIS — Z9884 Bariatric surgery status: Secondary | ICD-10-CM | POA: Insufficient documentation

## 2017-04-15 DIAGNOSIS — E039 Hypothyroidism, unspecified: Secondary | ICD-10-CM | POA: Diagnosis not present

## 2017-04-15 DIAGNOSIS — K7460 Unspecified cirrhosis of liver: Secondary | ICD-10-CM | POA: Diagnosis not present

## 2017-04-15 DIAGNOSIS — G4730 Sleep apnea, unspecified: Secondary | ICD-10-CM | POA: Insufficient documentation

## 2017-04-15 DIAGNOSIS — R42 Dizziness and giddiness: Secondary | ICD-10-CM | POA: Diagnosis not present

## 2017-04-15 DIAGNOSIS — N3281 Overactive bladder: Secondary | ICD-10-CM | POA: Insufficient documentation

## 2017-04-15 DIAGNOSIS — Z823 Family history of stroke: Secondary | ICD-10-CM | POA: Insufficient documentation

## 2017-04-15 DIAGNOSIS — Z86010 Personal history of colonic polyps: Secondary | ICD-10-CM | POA: Insufficient documentation

## 2017-04-15 DIAGNOSIS — Z79899 Other long term (current) drug therapy: Secondary | ICD-10-CM | POA: Diagnosis not present

## 2017-04-15 DIAGNOSIS — K219 Gastro-esophageal reflux disease without esophagitis: Secondary | ICD-10-CM | POA: Insufficient documentation

## 2017-04-15 DIAGNOSIS — D7589 Other specified diseases of blood and blood-forming organs: Secondary | ICD-10-CM | POA: Insufficient documentation

## 2017-04-15 DIAGNOSIS — I10 Essential (primary) hypertension: Secondary | ICD-10-CM | POA: Diagnosis not present

## 2017-04-15 DIAGNOSIS — Z8249 Family history of ischemic heart disease and other diseases of the circulatory system: Secondary | ICD-10-CM | POA: Insufficient documentation

## 2017-04-15 DIAGNOSIS — I2510 Atherosclerotic heart disease of native coronary artery without angina pectoris: Secondary | ICD-10-CM | POA: Diagnosis not present

## 2017-04-15 DIAGNOSIS — M109 Gout, unspecified: Secondary | ICD-10-CM | POA: Diagnosis not present

## 2017-04-15 DIAGNOSIS — Z803 Family history of malignant neoplasm of breast: Secondary | ICD-10-CM | POA: Insufficient documentation

## 2017-04-15 DIAGNOSIS — Z9889 Other specified postprocedural states: Secondary | ICD-10-CM | POA: Insufficient documentation

## 2017-04-15 DIAGNOSIS — K920 Hematemesis: Secondary | ICD-10-CM | POA: Diagnosis not present

## 2017-04-15 LAB — PROTIME-INR
INR: 1.14
Prothrombin Time: 14.5 seconds (ref 11.4–15.2)

## 2017-04-15 LAB — CBC WITH DIFFERENTIAL/PLATELET
Basophils Absolute: 0 10*3/uL (ref 0–0.1)
Basophils Relative: 0 %
Eosinophils Absolute: 0.1 10*3/uL (ref 0–0.7)
Eosinophils Relative: 2 %
HCT: 40.2 % (ref 40.0–52.0)
Hemoglobin: 13.3 g/dL (ref 13.0–18.0)
Lymphocytes Relative: 34 %
Lymphs Abs: 0.9 10*3/uL — ABNORMAL LOW (ref 1.0–3.6)
MCH: 27.7 pg (ref 26.0–34.0)
MCHC: 33 g/dL (ref 32.0–36.0)
MCV: 83.8 fL (ref 80.0–100.0)
Monocytes Absolute: 0.2 10*3/uL (ref 0.2–1.0)
Monocytes Relative: 7 %
Neutro Abs: 1.5 10*3/uL (ref 1.4–6.5)
Neutrophils Relative %: 57 %
Platelets: 82 10*3/uL — ABNORMAL LOW (ref 150–440)
RBC: 4.8 MIL/uL (ref 4.40–5.90)
RDW: 19 % — ABNORMAL HIGH (ref 11.5–14.5)
WBC: 2.7 10*3/uL — ABNORMAL LOW (ref 3.8–10.6)

## 2017-04-15 MED ORDER — HYDROCODONE-ACETAMINOPHEN 5-325 MG PO TABS
1.0000 | ORAL_TABLET | ORAL | Status: DC | PRN
  Filled 2017-04-15: qty 2

## 2017-04-15 MED ORDER — HEPARIN SOD (PORK) LOCK FLUSH 100 UNIT/ML IV SOLN
INTRAVENOUS | Status: AC
  Filled 2017-04-15: qty 5

## 2017-04-15 MED ORDER — MIDAZOLAM HCL 5 MG/5ML IJ SOLN
INTRAMUSCULAR | Status: AC
  Filled 2017-04-15: qty 5

## 2017-04-15 MED ORDER — FENTANYL CITRATE (PF) 100 MCG/2ML IJ SOLN
INTRAMUSCULAR | Status: AC | PRN
  Administered 2017-04-15: 50 ug via INTRAVENOUS
  Administered 2017-04-15: 25 ug via INTRAVENOUS

## 2017-04-15 MED ORDER — SODIUM CHLORIDE 0.9 % IV SOLN
INTRAVENOUS | Status: DC
  Administered 2017-04-15: 09:00:00 via INTRAVENOUS

## 2017-04-15 MED ORDER — MIDAZOLAM HCL 5 MG/5ML IJ SOLN
INTRAMUSCULAR | Status: AC | PRN
  Administered 2017-04-15 (×2): 1 mg via INTRAVENOUS

## 2017-04-15 MED ORDER — FENTANYL CITRATE (PF) 100 MCG/2ML IJ SOLN
INTRAMUSCULAR | Status: AC
  Filled 2017-04-15: qty 4

## 2017-04-15 NOTE — Procedures (Signed)
CT guided bone marrow biopsy.  2 aspirates and 2 cores.  Minimal blood loss and no immediate complication.  Bedrest for 1 hour.

## 2017-04-15 NOTE — Consult Note (Signed)
Chief Complaint: Patient was seen in consultation today for CT-guided bone marrow biopsy at the request of Yu,Zhou  Referring Physician(s): Yu,Zhou  Patient Status: [facility] - Out-pt  History of Present Illness: Dylan Ho is a 66 y.o. male with anemia and pancytopenia. His most recent health problems started in April when he presented with dizziness and hematemesis. Patient has a history of a gastric bypass procedure and found to have an ulcer at a surgical anastomosis. Reportedly, the GI issues have resolved and the patient had a recent endoscopy on 03/26/2017. Patient is still having dizziness and fatigue. Patient feels cold most of the time. He denies fevers. Patient is status post IV Venofer.   Past Medical History:  Diagnosis Date  . Anemia   . Arthritis   . CAD (coronary artery disease)    No Stents Present- per patient  . Cirrhosis ([place])    mild  . Colon polyps   . GERD (gastroesophageal reflux disease)    patient denies  . History of alcohol abuse Last Drink- 2011  . History of gout   . Hypertension    no longer on meds  . Hypothyroidism   . Intestinal ulcer   . Iron deficiency anemia due to chronic blood loss 02/27/2017  . Overactive bladder   . Sleep apnea    improved since gastric bypass  . Thyroid disease     Past Surgical History:  Procedure Laterality Date  . BILATERAL TOTAL SHOULDER ARTHROPLASTY  1990's  . CIRCUMCISION REVISION N/A 03/13/2017   Procedure: CIRCUMCISION REVISION;  Surgeon: Nickie Retort, MD;  Location: [facility] OR[facility];  Service: Urology;  Laterality: N/A;  . COLONOSCOPY WITH PROPOFOL N/A 10/30/2016   Procedure: COLONOSCOPY WITH PROPOFOL;  Surgeon: Lucilla Lame, MD;  Location: [facility];  Service: Endoscopy;  Laterality: N/A;  . ESOPHAGOGASTRODUODENOSCOPY (EGD) WITH PROPOFOL N/A 10/30/2016   Procedure: ESOPHAGOGASTRODUODENOSCOPY (EGD) WITH PROPOFOL;  Surgeon: Lucilla Lame, MD;  Location: [facility];  Service: Endoscopy;  Laterality:  N/A;  . ESOPHAGOGASTRODUODENOSCOPY (EGD) WITH PROPOFOL N/A 03/26/2017   Procedure: ESOPHAGOGASTRODUODENOSCOPY (EGD) WITH PROPOFOL;  Surgeon: Lucilla Lame, MD;  Location: [place];  Service: Gastroenterology;  Laterality: N/A;  requests early  . GASTRIC BYPASS  2016   Dr. Darnell Level- [facility], [place] ARTHROSCOPY  1990s  . KNEE ARTHROSCOPY W/ MENISCAL REPAIR Bilateral   . TONSILLECTOMY  2004    Allergies: Patient has no known allergies.  Medications: Prior to Admission medications   Medication Sig Start Date End Date Taking? Authorizing Provider  lactulose (CHRONULAC) 10 GM/15ML solution Take 15 mLs (10 g total) by mouth 2 (two) times daily as needed for mild constipation. 02/25/17  Yes Glean Hess, MD  levothyroxine (SYNTHROID, LEVOTHROID) 150 MCG tablet Take 1 tablet (150 mcg total) by mouth daily before breakfast. 02/20/17  Yes Plonk, Gwyndolyn Saxon, MD  Multiple Vitamin (MULTIVITAMIN) capsule Take 1 capsule by mouth daily.   Yes [provider]  pantoprazole (PROTONIX) 40 MG tablet Take 1 tablet (40 mg total) by mouth 2 (two) times daily. 03/18/17  Yes Lucilla Lame, MD  traZODone (DESYREL) 150 MG tablet Take 2 tablets (300 mg total) by mouth at bedtime. 03/30/17  Yes Plonk, Gwyndolyn Saxon, MD  vitamin B-12 (CYANOCOBALAMIN) 500 MCG tablet Take 1,000 mcg by mouth daily.    Yes [provider]     Family History  Problem Relation Age of Onset  . Stroke Mother   . Breast cancer Mother   .  Heart disease Father   . Stroke Father   . AAA (abdominal aortic aneurysm) Father   . Lung cancer Paternal Grandmother   . Prostate cancer Neg Hx   . Bladder Cancer Neg Hx   . Kidney cancer Neg Hx     Social History   Social History  . Marital status: Married    Spouse name: N/A  . Number of children: N/A  . Years of education: N/A   Social History Main Topics  . Smoking status: Never Smoker  . Smokeless tobacco: Current User    Types: Chew  . Alcohol use No     Comment:  Heavy ETOH in past- Last Drink 2011 per patient  . Drug use: No  . Sexual activity: Not Asked   Other Topics Concern  . None   Social History Narrative  . None     Review of Systems  Constitutional: Positive for fatigue. Negative for fever.  Respiratory: Negative.   Cardiovascular: Negative.   Gastrointestinal: Negative.   Genitourinary: Negative.   Musculoskeletal: Negative.   Neurological: Positive for dizziness.    Vital Signs: BP 125/84   Pulse (!) 54   Temp 98 F (36.7 C) (Oral)   Resp 19   Wt 211 lb (95.7 kg)   SpO2 100%   BMI 28.62 kg/m   Physical Exam  Constitutional: No distress.  HENT:  Mouth/Throat: Oropharynx is clear and moist.  Uvula has been surgically removed.  Cardiovascular: Normal rate, regular rhythm and normal heart sounds.   Pulmonary/Chest: Effort normal and breath sounds normal.  Abdominal: Soft. Bowel sounds are normal. He exhibits no distension. There is no tenderness.  Musculoskeletal: He exhibits no edema.    Imaging: No results found.  Labs:  CBC:  Recent Labs  02/19/17 1111 02/26/17 1135 04/09/17 1020 04/15/17 0754  WBC 2.9* 3.5* 2.6* 2.7*  HGB 11.1* 11.7* 12.6* 13.3  HCT 35.8* 36.0* 37.5* 40.2  PLT 99* 120* 88* 82*    COAGS:  Recent Labs  10/29/16 1321 04/15/17 0754  INR 1.33 1.14  APTT 24  --     BMP:  Recent Labs  12/12/16 1041 02/19/17 1111 02/26/17 1135 04/09/17 1020  NA 142 144 140 141  K 4.9 4.5 4.1 3.7  CL 109* 106 105 105  CO2 20 24 32 27  GLUCOSE 84 86 93 94  BUN _0 CALCIUM 9.0 8.8 9.1 8.8*  CREATININE 1.02 0.84 0.83 0.86  GFRNONAA 77 93 >60 >60  GFRAA 89 107 >60 >60    LIVER FUNCTION TESTS:  Recent Labs  12/12/16 1041 02/19/17 1111 02/26/17 1135 04/09/17 1020  BILITOT 0.4 0.4 0.9 0.6  AST 26 31 34 22  ALT _1 14*  ALKPHOS 70 83 81 68  PROT 6.2 6.0 6.9 6.2*  ALBUMIN 4.4 4.3 4.4 4.0    TUMOR MARKERS:  Recent Labs  10/22/16 1125  AFPTM 5.9     Assessment and Plan:   66 year old with history of normocytic anemia and chronic pancytopenia. Request for a bone marrow biopsy. CT-guided bone marrow biopsy was discussed with the patient. Risks of the procedure were discussed. Informed consent obtained from the patient. Plan for CT-guided bone marrow biopsy with moderate sedation.  Thank you for this interesting consult.  I greatly enjoyed meeting Dylan Ho and look forward to participating in their care.  A copy of this report was sent to the requesting provider on this date.  Electronically Signed: Anselm Pancoast, Vermell [place]  Thurmond Butts, MD 04/15/2017, 8:44 AM   I spent a total of  15 Minutes   in face to face in clinical consultation, greater than 50% of which was counseling/coordinating care for CT-guided bone marrow biopsy.

## 2017-04-20 ENCOUNTER — Encounter: Payer: Self-pay | Admitting: *Deleted

## 2017-04-23 ENCOUNTER — Ambulatory Visit: Payer: BLUE CROSS/BLUE SHIELD | Admitting: Oncology

## 2017-04-23 ENCOUNTER — Other Ambulatory Visit: Payer: BLUE CROSS/BLUE SHIELD

## 2017-05-01 ENCOUNTER — Encounter (HOSPITAL_COMMUNITY): Payer: Self-pay

## 2017-05-01 LAB — CHROMOSOME ANALYSIS, BONE MARROW

## 2017-05-01 LAB — TISSUE HYBRIDIZATION (BONE MARROW)-NCBH

## 2017-05-05 ENCOUNTER — Other Ambulatory Visit: Payer: Self-pay | Admitting: *Deleted

## 2017-05-05 DIAGNOSIS — D61818 Other pancytopenia: Secondary | ICD-10-CM

## 2017-05-07 ENCOUNTER — Inpatient Hospital Stay: Payer: BLUE CROSS/BLUE SHIELD

## 2017-05-07 ENCOUNTER — Inpatient Hospital Stay: Payer: BLUE CROSS/BLUE SHIELD | Attending: Oncology | Admitting: Oncology

## 2017-05-07 ENCOUNTER — Encounter: Payer: Self-pay | Admitting: Oncology

## 2017-05-07 VITALS — BP 132/73 | HR 62 | Temp 96.6°F | Wt 213.2 lb

## 2017-05-07 DIAGNOSIS — M1990 Unspecified osteoarthritis, unspecified site: Secondary | ICD-10-CM | POA: Diagnosis not present

## 2017-05-07 DIAGNOSIS — D72810 Lymphocytopenia: Secondary | ICD-10-CM | POA: Insufficient documentation

## 2017-05-07 DIAGNOSIS — E039 Hypothyroidism, unspecified: Secondary | ICD-10-CM

## 2017-05-07 DIAGNOSIS — K648 Other hemorrhoids: Secondary | ICD-10-CM

## 2017-05-07 DIAGNOSIS — R5383 Other fatigue: Secondary | ICD-10-CM

## 2017-05-07 DIAGNOSIS — D696 Thrombocytopenia, unspecified: Secondary | ICD-10-CM

## 2017-05-07 DIAGNOSIS — D61818 Other pancytopenia: Secondary | ICD-10-CM

## 2017-05-07 DIAGNOSIS — I10 Essential (primary) hypertension: Secondary | ICD-10-CM | POA: Insufficient documentation

## 2017-05-07 DIAGNOSIS — I2510 Atherosclerotic heart disease of native coronary artery without angina pectoris: Secondary | ICD-10-CM | POA: Diagnosis not present

## 2017-05-07 DIAGNOSIS — Z803 Family history of malignant neoplasm of breast: Secondary | ICD-10-CM | POA: Diagnosis not present

## 2017-05-07 DIAGNOSIS — Z801 Family history of malignant neoplasm of trachea, bronchus and lung: Secondary | ICD-10-CM

## 2017-05-07 DIAGNOSIS — K449 Diaphragmatic hernia without obstruction or gangrene: Secondary | ICD-10-CM | POA: Diagnosis not present

## 2017-05-07 DIAGNOSIS — K8020 Calculus of gallbladder without cholecystitis without obstruction: Secondary | ICD-10-CM

## 2017-05-07 DIAGNOSIS — D500 Iron deficiency anemia secondary to blood loss (chronic): Secondary | ICD-10-CM | POA: Diagnosis not present

## 2017-05-07 DIAGNOSIS — K219 Gastro-esophageal reflux disease without esophagitis: Secondary | ICD-10-CM | POA: Diagnosis not present

## 2017-05-07 DIAGNOSIS — K7469 Other cirrhosis of liver: Secondary | ICD-10-CM | POA: Diagnosis not present

## 2017-05-07 DIAGNOSIS — M109 Gout, unspecified: Secondary | ICD-10-CM

## 2017-05-07 DIAGNOSIS — Z86010 Personal history of colonic polyps: Secondary | ICD-10-CM | POA: Insufficient documentation

## 2017-05-07 DIAGNOSIS — Z79899 Other long term (current) drug therapy: Secondary | ICD-10-CM | POA: Diagnosis not present

## 2017-05-07 DIAGNOSIS — Z9884 Bariatric surgery status: Secondary | ICD-10-CM

## 2017-05-07 LAB — TSH: TSH: 0.374 u[IU]/mL (ref 0.350–4.500)

## 2017-05-07 LAB — IRON AND TIBC
Iron: 54 ug/dL (ref 45–182)
Saturation Ratios: 17 % — ABNORMAL LOW (ref 17.9–39.5)
TIBC: 324 ug/dL (ref 250–450)
UIBC: 270 ug/dL

## 2017-05-07 LAB — CBC WITH DIFFERENTIAL/PLATELET
Basophils Absolute: 0 10*3/uL (ref 0–0.1)
Basophils Relative: 0 %
Eosinophils Absolute: 0 10*3/uL (ref 0–0.7)
Eosinophils Relative: 0 %
HCT: 40.6 % (ref 40.0–52.0)
Hemoglobin: 13.6 g/dL (ref 13.0–18.0)
Lymphocytes Relative: 9 %
Lymphs Abs: 0.5 10*3/uL — ABNORMAL LOW (ref 1.0–3.6)
MCH: 28.2 pg (ref 26.0–34.0)
MCHC: 33.5 g/dL (ref 32.0–36.0)
MCV: 84.2 fL (ref 80.0–100.0)
Monocytes Absolute: 0.2 10*3/uL (ref 0.2–1.0)
Monocytes Relative: 4 %
Neutro Abs: 4.3 10*3/uL (ref 1.4–6.5)
Neutrophils Relative %: 87 %
Platelets: 101 10*3/uL — ABNORMAL LOW (ref 150–440)
RBC: 4.83 MIL/uL (ref 4.40–5.90)
RDW: 18.3 % — ABNORMAL HIGH (ref 11.5–14.5)
WBC: 4.9 10*3/uL (ref 3.8–10.6)

## 2017-05-07 LAB — URINALYSIS, COMPLETE (UACMP) WITH MICROSCOPIC
Bacteria, UA: NONE SEEN
Bilirubin Urine: NEGATIVE
Glucose, UA: NEGATIVE mg/dL
Hgb urine dipstick: NEGATIVE
Ketones, ur: NEGATIVE mg/dL
Leukocytes, UA: NEGATIVE
Nitrite: NEGATIVE
Protein, ur: NEGATIVE mg/dL
RBC / HPF: NONE SEEN RBC/hpf (ref 0–5)
Specific Gravity, Urine: 1.025 (ref 1.005–1.030)
Squamous Epithelial / LPF: NONE SEEN
pH: 6.5 (ref 5.0–8.0)

## 2017-05-07 LAB — FERRITIN: Ferritin: 27 ng/mL (ref 24–336)

## 2017-05-07 NOTE — Progress Notes (Signed)
*   Hematology/Oncology Follow Up Visit note [facility], [place] [phone] [phone]   Patient Care Team: Adline Potter, MD as PCP - General (Family Medicine)  REASON FOR VISIT Follow up for management of iron deficiency anemia and pancytopenia.   HISTORY OF PRESENTING ILLNESS/PERTINENT HEMATOLOGY HISTORY Dylan Mudgett Ho 66 y.o.  male with past medical history listed as below presents for follow up management of patient's iron deficiency and discuss bone marrow biopsy results. Recent EGD on 03/26/2017 showed no active bleeding, healthy appearance of gastrojejunal anastomosis.   Patient reports still fatigue, N of feeling cold all the time. His thyroid supplementation has been adjusted to a month ago.  Appetite is fair.  ROS:  Review of Systems  Constitutional: Positive for fatigue. Negative for chills, fever and unexpected weight change.  HENT:  Negative.  Negative for lump/mass.   Eyes: Negative.  Negative for eye problems.  Respiratory: Negative.  Negative for cough.   Cardiovascular: Negative.  Negative for chest pain and leg swelling.  Gastrointestinal: Negative.  Negative for abdominal pain and blood in stool.  Endocrine: Negative.   Genitourinary: Negative for bladder incontinence, dysuria and hematuria.   Musculoskeletal: Negative.  Negative for back pain.  Skin: Negative.  Negative for itching.  Neurological: Negative.  Negative for headaches.  Hematological: Negative.  Negative for adenopathy.  Psychiatric/Behavioral: Negative.  Negative for confusion and depression.    MEDICAL HISTORY:  Past Medical History:  Diagnosis Date  . Anemia   . Arthritis   . CAD (coronary artery disease)    No Stents Present- per patient  . Cirrhosis ([place])    mild  . Colon polyps   . GERD (gastroesophageal reflux disease)    patient denies  . History of alcohol abuse Last Drink- 2011  . History of gout   . Hypertension    no longer on meds  .  Hypothyroidism   . Intestinal ulcer   . Iron deficiency anemia due to chronic blood loss 02/27/2017  . Overactive bladder   . Sleep apnea    improved since gastric bypass  . Thyroid disease     SURGICAL HISTORY: Past Surgical History:  Procedure Laterality Date  . BILATERAL TOTAL SHOULDER ARTHROPLASTY  1990's  . CIRCUMCISION REVISION N/A 03/13/2017   Procedure: CIRCUMCISION REVISION;  Surgeon: Nickie Retort, MD;  Location: [facility];  Service: Urology;  Laterality: N/A;  . COLONOSCOPY WITH PROPOFOL N/A 10/30/2016   Procedure: COLONOSCOPY WITH PROPOFOL;  Surgeon: Lucilla Lame, MD;  Location: [facility];  Service: Endoscopy;  Laterality: N/A;  . ESOPHAGOGASTRODUODENOSCOPY (EGD) WITH PROPOFOL N/A 10/30/2016   Procedure: ESOPHAGOGASTRODUODENOSCOPY (EGD) WITH PROPOFOL;  Surgeon: Lucilla Lame, MD;  Location: [facility];  Service: Endoscopy;  Laterality: N/A;  . ESOPHAGOGASTRODUODENOSCOPY (EGD) WITH PROPOFOL N/A 03/26/2017   Procedure: ESOPHAGOGASTRODUODENOSCOPY (EGD) WITH PROPOFOL;  Surgeon: Lucilla Lame, MD;  Location: [place];  Service: Gastroenterology;  Laterality: N/A;  requests early  . GASTRIC BYPASS  2016   Dr. Darnell Level- [facility], [place] ARTHROSCOPY  1990s  . KNEE ARTHROSCOPY W/ MENISCAL REPAIR Bilateral   . TONSILLECTOMY  2004    SOCIAL HISTORY: Social History   Social History  . Marital status: Married    Spouse name: N/A  . Number of children: N/A  . Years of education: N/A   Occupational History  . Not on file.   Social History Main Topics  . Smoking status: Never Smoker  . Smokeless tobacco: Current User  Types: Chew  . Alcohol use No     Comment: Heavy ETOH in past- Last Drink 2011 per patient  . Drug use: No  . Sexual activity: Not on file   Other Topics Concern  . Not on file   Social History Narrative  . No narrative on file    FAMILY HISTORY: Family History  Problem Relation Age of Onset  . Stroke Mother   . Breast cancer Mother    . Heart disease Father   . Stroke Father   . AAA (abdominal aortic aneurysm) Father   . Lung cancer Paternal Grandmother   . Prostate cancer Neg Hx   . Bladder Cancer Neg Hx   . Kidney cancer Neg Hx     ALLERGIES:  has No Known Allergies.  MEDICATIONS:  Current Outpatient Prescriptions  Medication Sig Dispense Refill  . lactulose (CHRONULAC) 10 GM/15ML solution Take 15 mLs (10 g total) by mouth 2 (two) times daily as needed for mild constipation. 950 mL 2  . levothyroxine (SYNTHROID, LEVOTHROID) 150 MCG tablet Take 1 tablet (150 mcg total) by mouth daily before breakfast. 30 tablet 2  . Multiple Vitamin (MULTIVITAMIN) capsule Take 1 capsule by mouth daily.    . pantoprazole (PROTONIX) 40 MG tablet Take 1 tablet (40 mg total) by mouth 2 (two) times daily. 60 tablet 2  . traZODone (DESYREL) 150 MG tablet Take 2 tablets (300 mg total) by mouth at bedtime. 180 tablet 3  . vitamin B-12 (CYANOCOBALAMIN) 500 MCG tablet Take 1,000 mcg by mouth daily.      No current facility-administered medications for this visit.     PHYSICAL EXAMINATION: ECOG PERFORMANCE STATUS: 0 - Asymptomatic Vitals:   05/07/17 1031  BP: 132/73  Pulse: 62  Temp: (!) 96.6 F (35.9 C)   Filed Weights   05/07/17 1031  Weight: 213 lb 3 oz (96.7 kg)   GENERAL: No distress, well nourished.  SKIN:  No rashes or significant lesions  HEAD: Normocephalic, No masses, lesions, tenderness or abnormalities  EYES: Conjunctiva are pink, non icteric ENT: External ears normal ,lips , buccal mucosa, and tongue normal and mucous membranes are moist  LYMPH: No palpable cervical and axillary lymphadenopathy  LUNGS: Clear to auscultation, no crackles or wheezes HEART: Regular rate & rhythm, no murmurs, no gallops, S1 normal and S2 normal  ABDOMEN: Abdomen soft, non-tender, normal bowel sounds, I did not appreciate any  masses or organomegaly  MUSCULOSKELETAL: No CVA tenderness and no tenderness on percussion of the back or rib  cage.  EXTREMITIES: No edema, no skin discoloration or tenderness NEURO: Alert & oriented, no focal motor/sensory deficits.   LABORATORY DATA:  I have reviewed the data as listed Lab Results  Component Value Date   WBC 4.9 05/07/2017   HGB 13.6 05/07/2017   HCT 40.6 05/07/2017   MCV 84.2 05/07/2017   PLT 101 (L) 05/07/2017    Recent Labs  02/19/17 1111 02/26/17 1135 04/09/17 1020  NA 144 140 141  K 4.5 4.1 3.7  CL 106 105 105  CO2 24 32 27  GLUCOSE 86 93 94  BUN 10 12 9   CREATININE 0.84 0.83 0.86  CALCIUM 8.8 9.1 8.8*  GFRNONAA 93 >60 >60  GFRAA 107 >60 >60  PROT 6.0 6.9 6.2*  ALBUMIN 4.3 4.4 4.0  AST 31 34 22  ALT 21 24 14*  ALKPHOS 83 81 68  BILITOT 0.4 0.9 0.6   Results for MERRICK, MAGGIO (MRN [id number]) as of 02/26/2017  11:12  Ref. Range 12/12/2016 10:41 01/09/2017 10:36 02/09/2017 10:35 02/19/2017 11:11  TSH Latest Ref Range: 0.450 - 4.500 uIU/mL 25.950 (H) 0.195 (L) 0.378 (L) 0.188 (L)   Iron/TIBC/Ferritin/ %Sat    Component Value Date/Time   IRON 75 04/09/2017 1020   TIBC 296 04/09/2017 1020   FERRITIN 47 04/09/2017 1020   IRONPCTSAT 25 04/09/2017 1020   03/06/2017 Flow cytometry is negative for monoclonal process.  03/03/2017 Stool occult blood positive x 2 Recent B12 level was normal. Recent EGD on 03/26/2017 showed no active bleeding, healthy appearance of gastrojejunal anastomosis.   02/26/2017 Negative hepatitis panel, negative HIV.   RADIOGRAPHIC STUDIES: I have personally reviewed the radiological images as listed and agreed with the findings in the report.  CT 10/05/2016 IMPRESSION: 1. Increased attenuation in the small bowel mesentery, which is new since the prior CT. This has an appearance consistent with mesenteric panniculitis. 2. No other acute abnormalities or changes from the prior study. 3. Cirrhosis with borderline splenomegaly. 4. Small gallstone.  No acute cholecystitis. 5. Stable changes from previous gastric bypass  surgery.  10/30/2016 colonoscopy and EGD showed Normal esophagus. - Gastric bypass with a normal-sized pouch and intact staple line. Gastrojejunal anastomosis characterized by ulceration. - Normal examined jejunum. The perianal and digital rectal examinations were normal. Bleeding internal hemorrhoids were found. The hemorrhoids were Grade II   03/03/2017 IMPRESSION: 1. Multiple gallstones. The gallbladder is distended. Gallbladder wall thickness normal. Negative Murphy sign. No biliary distention. 2. Heterogeneous hepatic parenchymal pattern consistent with fatty infiltration and/or hepatocellular disease in this patient with known cirrhosis. No focal hepatic abnormality. Portal vein is patent with normal direction of blood flow. No splenomegaly. 3. 2 cm cyst with thin septation again noted in the right kidney. This is consistent with a benign cyst.  03/26/2017 EGD - Small hiatal hernia. - Gastric bypass. Gastrojejunal anastomosis characterized by healthy appearing mucosa. - Normal examined jejunum. - No specimens collected  ASSESSMENT & PLAN:  1. Iron deficiency anemia due to chronic blood loss   2. Thrombocytopenia ([place])   3. Other cirrhosis of liver ([place])   4. Lymphocytopenia    # Normocytic anemia with iron deficiency from chronic blood loss.    s/p IV Venofer x 4 doses. Hemoglobin improved. No bleeding on EGD. Will check UA to complete his work up.   # chronic lymphocytopenia and thrombocytopenia.today's CBC reviewed with patient. His platelet count has improved to 100,000.Hemoglobin has improved to normal. Persistent lymphocytopenia. Bone marrow biopsy results as well as cytogenetic test were normal, discussed with patient. Lymphocytopenia and thrombocytopenia likely secondary to chronic liver disease/cirrhosis/hyperslenism.   # Liver cirrhosis: compensated. Continue to follow up with GI.   # Fatigue: pending work up.his thyroid medication dose has been adjusted a few times  since June.we will recheck TSH all questions were answered. The patient knows to call the clinic with any problems questions or concerns.  Return of visit:  2 months Thank you for this kind referral and the opportunity to participate in the care of this patient. A copy of today's note is routed to referring provider    Earlie Server, MD, PhD Hematology Oncology [facility] Pager- [phone] 05/07/2017

## 2017-05-07 NOTE — Progress Notes (Signed)
Patient here today for follow up.   

## 2017-05-13 DIAGNOSIS — M7541 Impingement syndrome of right shoulder: Secondary | ICD-10-CM | POA: Insufficient documentation

## 2017-05-18 ENCOUNTER — Encounter: Payer: Self-pay | Admitting: Family Medicine

## 2017-05-19 ENCOUNTER — Other Ambulatory Visit: Payer: Self-pay | Admitting: Family Medicine

## 2017-05-19 MED ORDER — TRAZODONE HCL 150 MG PO TABS: 450.0000 mg | ORAL_TABLET | Freq: Every day | ORAL | 3 refills | Status: DC

## 2017-05-19 NOTE — Telephone Encounter (Signed)
Patient mychart message.

## 2017-07-02 ENCOUNTER — Ambulatory Visit: Payer: BLUE CROSS/BLUE SHIELD

## 2017-07-08 ENCOUNTER — Encounter: Payer: Self-pay | Admitting: Family Medicine

## 2017-07-08 ENCOUNTER — Ambulatory Visit: Payer: BLUE CROSS/BLUE SHIELD | Admitting: Family Medicine

## 2017-07-08 VITALS — BP 118/80 | HR 63 | Resp 16 | Ht 72.0 in | Wt 207.2 lb

## 2017-07-08 DIAGNOSIS — J40 Bronchitis, not specified as acute or chronic: Secondary | ICD-10-CM | POA: Diagnosis not present

## 2017-07-08 DIAGNOSIS — K289 Gastrojejunal ulcer, unspecified as acute or chronic, without hemorrhage or perforation: Secondary | ICD-10-CM | POA: Diagnosis not present

## 2017-07-08 DIAGNOSIS — K5901 Slow transit constipation: Secondary | ICD-10-CM | POA: Diagnosis not present

## 2017-07-08 DIAGNOSIS — E538 Deficiency of other specified B group vitamins: Secondary | ICD-10-CM

## 2017-07-08 DIAGNOSIS — G4700 Insomnia, unspecified: Secondary | ICD-10-CM | POA: Diagnosis not present

## 2017-07-08 DIAGNOSIS — M7541 Impingement syndrome of right shoulder: Secondary | ICD-10-CM

## 2017-07-08 DIAGNOSIS — R5383 Other fatigue: Secondary | ICD-10-CM | POA: Diagnosis not present

## 2017-07-08 DIAGNOSIS — E039 Hypothyroidism, unspecified: Secondary | ICD-10-CM | POA: Diagnosis not present

## 2017-07-08 DIAGNOSIS — T85898A Other specified complication of other internal prosthetic devices, implants and grafts, initial encounter: Secondary | ICD-10-CM

## 2017-07-08 DIAGNOSIS — K7460 Unspecified cirrhosis of liver: Secondary | ICD-10-CM

## 2017-07-08 MED ORDER — PANTOPRAZOLE SODIUM 40 MG PO TBEC: 40.0000 mg | DELAYED_RELEASE_TABLET | Freq: Two times a day (BID) | ORAL | 3 refills | Status: DC

## 2017-07-08 MED ORDER — LEVOTHYROXINE SODIUM 150 MCG PO TABS: 150.0000 ug | ORAL_TABLET | Freq: Every day | ORAL | 3 refills | Status: DC

## 2017-07-08 MED ORDER — DOXYCYCLINE HYCLATE 100 MG PO CAPS: 100.0000 mg | ORAL_CAPSULE | Freq: Two times a day (BID) | ORAL | 0 refills | Status: DC

## 2017-07-08 MED ORDER — TRAZODONE HCL 150 MG PO TABS
450.0000 mg | ORAL_TABLET | Freq: Every day | ORAL | 3 refills | Status: DC
Start: 2017-07-08 — End: 2017-12-09

## 2017-07-08 MED ORDER — PROMETHAZINE-CODEINE 6.25-10 MG/5ML PO SOLN: 5.0000 mL | Freq: Four times a day (QID) | ORAL | 0 refills | Status: DC | PRN

## 2017-07-09 ENCOUNTER — Encounter: Payer: Self-pay | Admitting: Family Medicine

## 2017-07-09 ENCOUNTER — Telehealth: Payer: Self-pay

## 2017-07-09 DIAGNOSIS — K5900 Constipation, unspecified: Secondary | ICD-10-CM | POA: Insufficient documentation

## 2017-07-09 NOTE — Telephone Encounter (Signed)
Called pharmacy ([facility]), they are willing to fill trazodone but insurance won't cover until 17 days from now. He will have to pay cash for the next 17 days then get usual refill. Do not recommend taking more than 3 per night. Synthroid rx is ready to be picked up.

## 2017-07-09 NOTE — Telephone Encounter (Signed)
Needs Trazodone refill but needs 4 daily or 2 daily for insurance to cover. Was taking 150 mg 3 daily but had times when only 4 worked and now only way he can have it filled is to change dose. Also said his Levothyroxine did not go over from visit. Send all these to [facility].

## 2017-07-09 NOTE — Progress Notes (Signed)
Date:  07/08/2017   Name:  Dylan Ho   DOB:  February 27, 1951   MRN:  [id number]  PCP:  Adline Potter, MD    Chief Complaint: Hypothyroidism (refill all meds); Cough (1 week of cold symptoms....wife dx with bronchitis this past weekend. ); and body temperature  (feels cold at all times even when feeling well )   History of Present Illness:  This is a 67 y.o. male seen for 4 month f/u. C/o 1 week hx mildly productive cough, sinus congestion with initial fever, none now, feels sxs getting worse. Still fatigued, wants testosterone checked. Taking trazodone 450 mg qhs to sleep. Remains on Protonix bid for anastamotic ulcer, no current sxs. Seeing heme for pancytopenia, felt due to cirrhosis, getting IV iron. Seeing ortho for knee pain, last injection no help. Using lactulose more often for constipation. Weight down 17# since last visit, over 200# since bariatric surgery 3 yrs ago.  Review of Systems:  Review of Systems  HENT: Negative for ear pain and sore throat.   Respiratory: Negative for shortness of breath.   Cardiovascular: Negative for chest pain and leg swelling.  Gastrointestinal: Negative for abdominal pain.  Genitourinary: Negative for difficulty urinating.  Neurological: Negative for syncope and light-headedness.    Patient Active Problem List   Diagnosis Date Noted  . Impingement syndrome of right shoulder region 05/13/2017  . Iron deficiency anemia due to chronic blood loss 02/27/2017  . Other pancytopenia ([place]) 02/25/2017  . B12 deficiency 01/09/2017  . 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 Osteoarthritis of both knees 01/09/2017  . Insomnia 12/12/2016  . Hx of gastric bypass 12/12/2016  . Cirrhosis of liver ([place]) 11/19/2016  . Positive Helicobacter pylori serology 11/19/2016  . Sleep apnea 11/19/2016  . Von Willebrand disease ([place]) 11/19/2016  . Anastomotic ulcer S/P gastric bypass   . Obesity (BMI 30.0-34.9) 01/19/2014  . Nondependent alcohol abuse 01/11/2010  .  Hypothyroidism 01/11/2010  . Hyperlipidemia 01/11/2010  . Coronary atherosclerosis of native coronary artery 09/22/2008  . Essential hypertension 08/24/2008    Prior to Admission medications   Medication Sig Start Date End Date Taking? Authorizing Provider  lactulose (CHRONULAC) 10 GM/15ML solution Take 15 mLs (10 g total) by mouth 2 (two) times daily as needed for mild constipation. 02/25/17  Yes Glean Hess, MD  levothyroxine (SYNTHROID, LEVOTHROID) 150 MCG tablet Take 1 tablet (150 mcg total) by mouth daily before breakfast. 07/08/17  Yes Demia Viera, Gwyndolyn Saxon, MD  Multiple Vitamin (MULTIVITAMIN) capsule Take 1 capsule by mouth daily.   Yes [provider]  pantoprazole (PROTONIX) 40 MG tablet Take 1 tablet (40 mg total) by mouth 2 (two) times daily. 07/08/17  Yes Auria Mckinlay, Gwyndolyn Saxon, MD  traZODone (DESYREL) 150 MG tablet Take 3 tablets (450 mg total) by mouth at bedtime. 07/08/17  Yes Seeley Hissong, Gwyndolyn Saxon, MD  vitamin B-12 (CYANOCOBALAMIN) 500 MCG tablet Take 1,000 mcg by mouth daily.    Yes [provider]  doxycycline (VIBRAMYCIN) 100 MG capsule Take 1 capsule (100 mg total) by mouth 2 (two) times daily. 07/08/17   Lennell Shanks, Gwyndolyn Saxon, MD  Promethazine-Codeine 6.25-10 MG/5ML SOLN Take 5 mLs by mouth every 6 (six) hours as needed. 07/08/17   Adline Potter, MD    No Known Allergies  Past Surgical History:  Procedure Laterality Date  . BILATERAL TOTAL SHOULDER ARTHROPLASTY  1990's  . CIRCUMCISION REVISION N/A 03/13/2017   Procedure: CIRCUMCISION REVISION;  Surgeon: Nickie Retort, MD;  Location: [facility];  Service: Urology;  Laterality: N/A;  .  COLONOSCOPY WITH PROPOFOL N/A 10/30/2016   Procedure: COLONOSCOPY WITH PROPOFOL;  Surgeon: Lucilla Lame, MD;  Location: [facility];  Service: Endoscopy;  Laterality: N/A;  . ESOPHAGOGASTRODUODENOSCOPY (EGD) WITH PROPOFOL N/A 10/30/2016   Procedure: ESOPHAGOGASTRODUODENOSCOPY (EGD) WITH PROPOFOL;  Surgeon: Lucilla Lame, MD;  Location: [facility];   Service: Endoscopy;  Laterality: N/A;  . ESOPHAGOGASTRODUODENOSCOPY (EGD) WITH PROPOFOL N/A 03/26/2017   Procedure: ESOPHAGOGASTRODUODENOSCOPY (EGD) WITH PROPOFOL;  Surgeon: Lucilla Lame, MD;  Location: [place];  Service: Gastroenterology;  Laterality: N/A;  requests early  . GASTRIC BYPASS  2016   Dr. Darnell Level- [facility], [place] ARTHROSCOPY  1990s  . KNEE ARTHROSCOPY W/ MENISCAL REPAIR Bilateral   . TONSILLECTOMY  2004    Social History   Tobacco Use  . Smoking status: Never Smoker  . Smokeless tobacco: Current User    Types: Chew  Substance Use Topics  . Alcohol use: No    Comment: Heavy ETOH in past- Last Drink 2011 per patient  . Drug use: No    Family History  Problem Relation Age of Onset  . Stroke Mother   . Breast cancer Mother   . Heart disease Father   . Stroke Father   . AAA (abdominal aortic aneurysm) Father   . Lung cancer Paternal Grandmother   . Prostate cancer Neg Hx   . Bladder Cancer Neg Hx   . Kidney cancer Neg Hx     Medication list has been reviewed and updated.  Physical Examination: BP 118/80   Pulse 63   Resp 16   Ht 6' (1.829 m)   Wt 207 lb 3.2 oz (94 kg)   SpO2 99%   BMI 28.10 kg/m   Physical Exam  Constitutional: He appears well-developed and well-nourished.  Cardiovascular: Normal rate, regular rhythm and normal heart sounds.  Pulmonary/Chest: Effort normal and breath sounds normal.  Musculoskeletal: He exhibits no edema.  Neurological: He is alert.  Skin: Skin is warm and dry.  Psychiatric: He has a normal mood and affect. His behavior is normal.  Nursing note and vitals reviewed.   Assessment and Plan:  1. Cirrhosis of liver without ascites, unspecified hepatic cirrhosis type ([place]) Recheck CMP, followed by GI  2. Bronchitis Doxy 100 mg bid x 7d, Phenergan with codeine prn  3. Hypothyroidism, unspecified type On Synthroid - TSH  4. Anastomotic ulcer S/P gastric bypass Cont Protonix bid per GI  5. Impingement  syndrome of right shoulder region Ortho following  6. Fatigue, unspecified type - Comprehensive Metabolic Panel (CMET) - Testosterone  7. Insomnia, unspecified type Well controlled on high dose trazodone, consider taper  8. Slow transit constipation Continue lactulose prn  9. B12 deficiency On supplement - B12  Return in about 3 months (around 10/06/2017).  Satira Anis. [facility]  07/09/2017

## 2017-07-10 ENCOUNTER — Other Ambulatory Visit: Payer: Self-pay | Admitting: Family Medicine

## 2017-07-10 LAB — COMPREHENSIVE METABOLIC PANEL
ALT: 17 IU/L (ref 0–44)
AST: 22 IU/L (ref 0–40)
Albumin/Globulin Ratio: 2 (ref 1.2–2.2)
Albumin: 4 g/dL (ref 3.6–4.8)
Alkaline Phosphatase: 66 IU/L (ref 39–117)
BUN/Creatinine Ratio: 10 (ref 10–24)
BUN: 11 mg/dL (ref 8–27)
Bilirubin Total: 0.4 mg/dL (ref 0.0–1.2)
CO2: 28 mmol/L (ref 20–29)
Calcium: 9.1 mg/dL (ref 8.6–10.2)
Chloride: 104 mmol/L (ref 96–106)
Creatinine, Ser: 1.12 mg/dL (ref 0.76–1.27)
GFR calc Af Amer: 80 mL/min/{1.73_m2} (ref >59)
GFR calc non Af Amer: 69 mL/min/{1.73_m2} (ref >59)
Globulin, Total: 2 g/dL (ref 1.5–4.5)
Glucose: 92 mg/dL (ref 65–99)
Potassium: 4.6 mmol/L (ref 3.5–5.2)
Sodium: 145 mmol/L — ABNORMAL HIGH (ref 134–144)
Total Protein: 6 g/dL (ref 6.0–8.5)

## 2017-07-10 LAB — VITAMIN B12: Vitamin B-12: 857 pg/mL (ref 232–1245)

## 2017-07-10 LAB — TSH: TSH: 45.21 u[IU]/mL — ABNORMAL HIGH (ref 0.450–4.500)

## 2017-07-10 LAB — TESTOSTERONE: Testosterone: 410 ng/dL (ref 264–916)

## 2017-07-10 MED ORDER — LEVOTHYROXINE SODIUM 200 MCG PO TABS: 200.0000 ug | ORAL_TABLET | Freq: Every day | ORAL | 2 refills | Status: DC

## 2017-07-10 NOTE — Telephone Encounter (Signed)
Patient mychart message.

## 2017-08-12 ENCOUNTER — Ambulatory Visit: Payer: BLUE CROSS/BLUE SHIELD | Admitting: Family Medicine

## 2017-08-31 ENCOUNTER — Inpatient Hospital Stay: Payer: Self-pay

## 2017-09-03 ENCOUNTER — Ambulatory Visit: Payer: BLUE CROSS/BLUE SHIELD | Admitting: Oncology

## 2017-09-10 ENCOUNTER — Other Ambulatory Visit: Payer: Self-pay | Admitting: Family Medicine

## 2017-11-22 DIAGNOSIS — S61206A Unspecified open wound of right little finger without damage to nail, initial encounter: Secondary | ICD-10-CM | POA: Diagnosis not present

## 2017-12-04 ENCOUNTER — Other Ambulatory Visit: Payer: Self-pay | Admitting: Internal Medicine

## 2017-12-09 ENCOUNTER — Ambulatory Visit (INDEPENDENT_AMBULATORY_CARE_PROVIDER_SITE_OTHER): Payer: Medicare HMO | Admitting: Internal Medicine

## 2017-12-09 ENCOUNTER — Encounter: Payer: Self-pay | Admitting: Internal Medicine

## 2017-12-09 VITALS — BP 136/92 | HR 72 | Temp 98.0°F | Resp 16 | Ht 70.5 in | Wt 207.0 lb

## 2017-12-09 DIAGNOSIS — E039 Hypothyroidism, unspecified: Secondary | ICD-10-CM

## 2017-12-09 DIAGNOSIS — J0110 Acute frontal sinusitis, unspecified: Secondary | ICD-10-CM | POA: Diagnosis not present

## 2017-12-09 DIAGNOSIS — D696 Thrombocytopenia, unspecified: Secondary | ICD-10-CM | POA: Diagnosis not present

## 2017-12-09 DIAGNOSIS — I10 Essential (primary) hypertension: Secondary | ICD-10-CM | POA: Diagnosis not present

## 2017-12-09 DIAGNOSIS — F5101 Primary insomnia: Secondary | ICD-10-CM | POA: Diagnosis not present

## 2017-12-09 DIAGNOSIS — R69 Illness, unspecified: Secondary | ICD-10-CM | POA: Diagnosis not present

## 2017-12-09 MED ORDER — LEVOTHYROXINE SODIUM 150 MCG PO TABS: 150.0000 ug | ORAL_TABLET | Freq: Every day | ORAL | 1 refills | Status: DC

## 2017-12-09 MED ORDER — TRAZODONE HCL 150 MG PO TABS: 600.0000 mg | ORAL_TABLET | Freq: Every day | ORAL | 1 refills | Status: DC

## 2017-12-09 MED ORDER — DOXYCYCLINE HYCLATE 100 MG PO TABS: 100.0000 mg | ORAL_TABLET | Freq: Two times a day (BID) | ORAL | 0 refills | Status: AC

## 2017-12-09 NOTE — Patient Instructions (Signed)
Belsomra 15 mg - take one just before bedtime

## 2017-12-09 NOTE — Progress Notes (Signed)
Date:  12/09/2017   Name:  Dylan Ho   DOB:  [birth year]   MRN:  [id number]   Chief Complaint: Headache Headache   This is a chronic problem. The current episode started 1 to 4 weeks ago. The problem occurs constantly. The pain is located in the frontal region. The quality of the pain is described as aching and dull. Associated symptoms include dizziness, insomnia, sinus pressure and a sore throat. Pertinent negatives include no coughing, fever or weakness.  Thyroid Problem  Presents for follow-up (has not taken medication for months due to insurance) visit. Patient reports no fatigue. (Dose increased last visit due to very high TSH)  Insomnia  Primary symptoms: sleep disturbance, difficulty falling asleep.  The problem occurs nightly. The problem is unchanged. Treatments tried: has had to take up to 750 mg of trazodone at night to sleep.    Lab Results  Component Value Date   TSH 45.210 (H) 07/09/2017     Review of Systems  Constitutional: Negative for chills, fatigue and fever.  HENT: Positive for congestion, sinus pressure and sore throat. Negative for trouble swallowing.   Respiratory: Negative for cough, chest tightness, shortness of breath and wheezing.   Musculoskeletal: Negative for arthralgias.  Neurological: Positive for dizziness and headaches. Negative for weakness.  Psychiatric/Behavioral: Positive for sleep disturbance. The patient has insomnia.     Patient Active Problem List   Diagnosis Date Noted  . Constipation 07/09/2017  . Impingement syndrome of right shoulder region 05/13/2017  . Iron deficiency anemia due to chronic blood loss 02/27/2017  . B12 deficiency 01/09/2017  . 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 Osteoarthritis of both knees 01/09/2017  . Insomnia 12/12/2016  . Hx of gastric bypass 12/12/2016  . Cirrhosis of liver ([place]) 11/19/2016  . Positive Helicobacter pylori serology 11/19/2016  . Sleep apnea 11/19/2016  . Von Willebrand disease  ([place]) 11/19/2016  . Anastomotic ulcer S/P gastric bypass   . Obesity (BMI 30.0-34.9) 01/19/2014  . Nondependent alcohol abuse 01/11/2010  . Hypothyroidism 01/11/2010  . Hyperlipidemia 01/11/2010  . Coronary atherosclerosis of native coronary artery 09/22/2008  . Essential hypertension 08/24/2008    Prior to Admission medications   Medication Sig Start Date End Date Taking? Authorizing Provider  lactulose (CHRONULAC) 10 GM/15ML solution TAKE 15MLS TWICE A DAY 09/11/17  Yes Plonk, Gwyndolyn Saxon, MD  levothyroxine (SYNTHROID, LEVOTHROID) 200 MCG tablet Take 1 tablet (200 mcg total) by mouth daily before breakfast. 07/10/17  Yes Plonk, Gwyndolyn Saxon, MD  Multiple Vitamin (MULTIVITAMIN) capsule Take 1 capsule by mouth daily.   Yes [provider]  pantoprazole (PROTONIX) 40 MG tablet Take 1 tablet (40 mg total) by mouth 2 (two) times daily. 07/08/17  Yes Plonk, Gwyndolyn Saxon, MD  traZODone (DESYREL) 150 MG tablet Take 3 tablets (450 mg total) by mouth at bedtime. 07/08/17  Yes Plonk, Gwyndolyn Saxon, MD  vitamin B-12 (CYANOCOBALAMIN) 500 MCG tablet Take 1,000 mcg by mouth daily.    Yes [provider]    No Known Allergies  Past Surgical History:  Procedure Laterality Date  . BILATERAL TOTAL SHOULDER ARTHROPLASTY  1990's  . CIRCUMCISION REVISION N/A 03/13/2017   Procedure: CIRCUMCISION REVISION;  Surgeon: Nickie Retort, MD;  Location: [facility] ORS;  Service: Urology;  Laterality: N/A;  . COLONOSCOPY WITH PROPOFOL N/A 10/30/2016   Procedure: COLONOSCOPY WITH PROPOFOL;  Surgeon: Lucilla Lame, MD;  Location: [facility] ENDOSCOPY;  Service: Endoscopy;  Laterality: N/A;  . ESOPHAGOGASTRODUODENOSCOPY (EGD) WITH PROPOFOL N/A 10/30/2016   Procedure: ESOPHAGOGASTRODUODENOSCOPY (EGD)  WITH PROPOFOL;  Surgeon: Lucilla Lame, MD;  Location: [facility] ENDOSCOPY;  Service: Endoscopy;  Laterality: N/A;  . ESOPHAGOGASTRODUODENOSCOPY (EGD) WITH PROPOFOL N/A 03/26/2017   Procedure: ESOPHAGOGASTRODUODENOSCOPY (EGD) WITH PROPOFOL;  Surgeon:  Lucilla Lame, MD;  Location: [place];  Service: Gastroenterology;  Laterality: N/A;  requests early  . GASTRIC BYPASS  2016   Dr. Darnell Level- [facility], [place] ARTHROSCOPY  1990s  . KNEE ARTHROSCOPY W/ MENISCAL REPAIR Bilateral   . TONSILLECTOMY  2004    Social History   Tobacco Use  . Smoking status: Never Smoker  . Smokeless tobacco: Current User    Types: Chew  Substance Use Topics  . Alcohol use: No    Comment: Heavy ETOH in past- Last Drink 2011 per patient  . Drug use: No     Medication list has been reviewed and updated.  Current Meds  Medication Sig  . lactulose (CHRONULAC) 10 GM/15ML solution TAKE 15MLS TWICE A DAY  . levothyroxine (SYNTHROID, LEVOTHROID) 150 MCG tablet Take 1 tablet (150 mcg total) by mouth daily before breakfast.  . Multiple Vitamin (MULTIVITAMIN) capsule Take 1 capsule by mouth daily.  . pantoprazole (PROTONIX) 40 MG tablet Take 1 tablet (40 mg total) by mouth 2 (two) times daily.  . traZODone (DESYREL) 150 MG tablet Take 4 tablets (600 mg total) by mouth at bedtime.  . vitamin B-12 (CYANOCOBALAMIN) 500 MCG tablet Take 1,000 mcg by mouth daily.   . [DISCONTINUED] levothyroxine (SYNTHROID, LEVOTHROID) 200 MCG tablet Take 1 tablet (200 mcg total) by mouth daily before breakfast.  . [DISCONTINUED] traZODone (DESYREL) 150 MG tablet Take 3 tablets (450 mg total) by mouth at bedtime.    PHQ 2/9 Scores 12/09/2017 01/09/2017 12/12/2016  PHQ - 2 Score 0 0 0    Physical Exam  Constitutional: He is oriented to person, place, and time. He appears well-developed and well-nourished. No distress.  HENT:  Head: Normocephalic and atraumatic.  Right Ear: External ear and ear canal normal. Tympanic membrane is not erythematous and not retracted.  Left Ear: External ear and ear canal normal. Tympanic membrane is not erythematous and not retracted.  Nose: Right sinus exhibits frontal sinus tenderness. Right sinus exhibits no maxillary sinus tenderness. Left sinus  exhibits frontal sinus tenderness. Left sinus exhibits no maxillary sinus tenderness.  Mouth/Throat: Uvula is midline and mucous membranes are normal. No oral lesions. Posterior oropharyngeal erythema present. No oropharyngeal exudate.  Cardiovascular: Normal rate, regular rhythm and normal heart sounds.  Pulmonary/Chest: Effort normal and breath sounds normal. No respiratory distress. He has no wheezes. He has no rales.  Musculoskeletal: Normal range of motion.  No temporal artery tenderness   Lymphadenopathy:    He has no cervical adenopathy.  Neurological: He is alert and oriented to person, place, and time.  Skin: Skin is warm and dry. No rash noted.  Psychiatric: He has a normal mood and affect. His behavior is normal. Thought content normal.  Nursing note and vitals reviewed.   BP (!) 136/92   Pulse 72   Temp 98 F (36.7 C) (Oral)   Resp 16   Ht 5' 10.5" (1.791 m)   Wt 207 lb (93.9 kg)   SpO2 98%   BMI 29.28 kg/m   Assessment and Plan: 1. Acute non-recurrent frontal sinusitis Likely contributing to elevated BP - doxycycline (VIBRA-TABS) 100 MG tablet; Take 1 tablet (100 mg total) by mouth 2 (two) times daily for 10 days.  Dispense: 20 tablet; Refill: 0  2. Essential hypertension  Monitor at home without medication at this time  3. Acquired hypothyroidism Resume medication and recheck in 6 weeks - levothyroxine (SYNTHROID, LEVOTHROID) 150 MCG tablet; Take 1 tablet (150 mcg total) by mouth daily before breakfast.  Dispense: 90 tablet; Refill: 1  4. Thrombocytopenia ([place]) Will check labs next visit  5. Primary insomnia Max dose of trazodone is 600 mg Sample of Belsomra 15 mg #6 given - traZODone (DESYREL) 150 MG tablet; Take 4 tablets (600 mg total) by mouth at bedtime.  Dispense: 360 tablet; Refill: 1   Meds ordered this encounter  Medications  . levothyroxine (SYNTHROID, LEVOTHROID) 150 MCG tablet    Sig: Take 1 tablet (150 mcg total) by mouth daily before  breakfast.    Dispense:  90 tablet    Refill:  1  . traZODone (DESYREL) 150 MG tablet    Sig: Take 4 tablets (600 mg total) by mouth at bedtime.    Dispense:  360 tablet    Refill:  1  . doxycycline (VIBRA-TABS) 100 MG tablet    Sig: Take 1 tablet (100 mg total) by mouth 2 (two) times daily for 10 days.    Dispense:  20 tablet    Refill:  0    Partially dictated using Editor, commissioning. Any errors are unintentional.  Halina Maidens, MD [facility]  12/09/2017   There are no diagnoses linked to this encounter.

## 2018-01-15 ENCOUNTER — Other Ambulatory Visit: Payer: Self-pay | Admitting: Internal Medicine

## 2018-01-15 ENCOUNTER — Telehealth: Payer: Self-pay

## 2018-01-15 DIAGNOSIS — F5101 Primary insomnia: Secondary | ICD-10-CM

## 2018-01-15 MED ORDER — SUVOREXANT 15 MG PO TABS: 1.0000 | ORAL_TABLET | Freq: Every day | ORAL | 0 refills | Status: DC

## 2018-01-15 NOTE — Telephone Encounter (Signed)
Patient called requesting an Rx of Belsomra 15 mg. Stated this medication has helped him when given samples back in June. Patient would like Rx to go to [place] [address]. Please advise

## 2018-01-19 ENCOUNTER — Telehealth: Payer: Self-pay

## 2018-01-19 NOTE — Telephone Encounter (Signed)
Received fax from [facility] for PA to be done on Belsomra. [employer] and it was approved from 07/05/2017-07/06/2018.  They will fax [place] a approval notice, and mail one to the patient.   Called and informed patient it was approved.

## 2018-01-20 ENCOUNTER — Ambulatory Visit: Payer: Medicare HMO | Admitting: Internal Medicine

## 2018-02-22 ENCOUNTER — Other Ambulatory Visit: Payer: Self-pay | Admitting: Internal Medicine

## 2018-02-22 DIAGNOSIS — F5101 Primary insomnia: Secondary | ICD-10-CM

## 2018-03-04 ENCOUNTER — Ambulatory Visit: Payer: Medicare HMO | Admitting: Internal Medicine

## 2018-03-09 ENCOUNTER — Ambulatory Visit: Payer: Medicare HMO | Admitting: Internal Medicine

## 2018-03-09 DIAGNOSIS — M25562 Pain in left knee: Secondary | ICD-10-CM | POA: Diagnosis not present

## 2018-03-29 DIAGNOSIS — M25562 Pain in left knee: Secondary | ICD-10-CM | POA: Diagnosis not present

## 2018-04-06 ENCOUNTER — Ambulatory Visit: Payer: Self-pay | Admitting: Orthopedic Surgery

## 2018-04-06 DIAGNOSIS — S83207A Unspecified tear of unspecified meniscus, current injury, left knee, initial encounter: Secondary | ICD-10-CM | POA: Diagnosis not present

## 2018-04-09 DIAGNOSIS — R51 Headache: Secondary | ICD-10-CM | POA: Diagnosis not present

## 2018-04-09 DIAGNOSIS — M5430 Sciatica, unspecified side: Secondary | ICD-10-CM | POA: Diagnosis not present

## 2018-04-09 DIAGNOSIS — Z72820 Sleep deprivation: Secondary | ICD-10-CM | POA: Diagnosis not present

## 2018-04-09 DIAGNOSIS — M9903 Segmental and somatic dysfunction of lumbar region: Secondary | ICD-10-CM | POA: Diagnosis not present

## 2018-04-09 DIAGNOSIS — M791 Myalgia, unspecified site: Secondary | ICD-10-CM | POA: Diagnosis not present

## 2018-04-09 DIAGNOSIS — M6240 Contracture of muscle, unspecified site: Secondary | ICD-10-CM | POA: Diagnosis not present

## 2018-04-09 DIAGNOSIS — M9902 Segmental and somatic dysfunction of thoracic region: Secondary | ICD-10-CM | POA: Diagnosis not present

## 2018-04-09 DIAGNOSIS — M9905 Segmental and somatic dysfunction of pelvic region: Secondary | ICD-10-CM | POA: Diagnosis not present

## 2018-04-09 DIAGNOSIS — M9901 Segmental and somatic dysfunction of cervical region: Secondary | ICD-10-CM | POA: Diagnosis not present

## 2018-04-11 DIAGNOSIS — R69 Illness, unspecified: Secondary | ICD-10-CM | POA: Diagnosis not present

## 2018-04-13 ENCOUNTER — Other Ambulatory Visit: Payer: Self-pay

## 2018-04-13 MED ORDER — LACTULOSE 10 GM/15ML PO SOLN: ORAL | 1 refills | Status: DC

## 2018-05-14 ENCOUNTER — Other Ambulatory Visit: Payer: Self-pay | Admitting: Internal Medicine

## 2018-05-14 DIAGNOSIS — F5101 Primary insomnia: Secondary | ICD-10-CM

## 2018-05-14 NOTE — Telephone Encounter (Signed)
Pt needs refill sent to [facility] on [address]   traZODone (DESYREL) 150 MG tablet [[id number]]

## 2018-05-16 ENCOUNTER — Other Ambulatory Visit: Payer: Self-pay | Admitting: Internal Medicine

## 2018-05-16 DIAGNOSIS — F5101 Primary insomnia: Secondary | ICD-10-CM

## 2018-05-17 ENCOUNTER — Encounter: Payer: Self-pay | Admitting: Internal Medicine

## 2018-05-17 ENCOUNTER — Ambulatory Visit (INDEPENDENT_AMBULATORY_CARE_PROVIDER_SITE_OTHER): Payer: Medicare HMO | Admitting: Internal Medicine

## 2018-05-17 VITALS — BP 106/76 | HR 68 | Resp 16 | Ht 70.0 in | Wt 207.0 lb

## 2018-05-17 DIAGNOSIS — M170 Bilateral primary osteoarthritis of knee: Secondary | ICD-10-CM | POA: Diagnosis not present

## 2018-05-17 DIAGNOSIS — F5101 Primary insomnia: Secondary | ICD-10-CM | POA: Diagnosis not present

## 2018-05-17 DIAGNOSIS — E039 Hypothyroidism, unspecified: Secondary | ICD-10-CM

## 2018-05-17 DIAGNOSIS — K289 Gastrojejunal ulcer, unspecified as acute or chronic, without hemorrhage or perforation: Secondary | ICD-10-CM

## 2018-05-17 DIAGNOSIS — R69 Illness, unspecified: Secondary | ICD-10-CM | POA: Diagnosis not present

## 2018-05-17 MED ORDER — TRAZODONE HCL 150 MG PO TABS: 600.0000 mg | ORAL_TABLET | Freq: Every day | ORAL | 1 refills | Status: AC

## 2018-05-17 MED ORDER — LEVOTHYROXINE SODIUM 150 MCG PO TABS: 150.0000 ug | ORAL_TABLET | Freq: Every day | ORAL | 1 refills | Status: DC

## 2018-05-17 MED ORDER — PANTOPRAZOLE SODIUM 40 MG PO TBEC: 40.0000 mg | DELAYED_RELEASE_TABLET | Freq: Two times a day (BID) | ORAL | 3 refills | Status: DC

## 2018-05-17 NOTE — Progress Notes (Signed)
Date:  05/17/2018   Name:  Dylan Ho   DOB:  [birth year]   MRN:  [id number]   Chief Complaint: Thyroid Problem (refills); Gastroesophageal Reflux (refills); and Insomnia (refills)  Gastroesophageal Reflux  He complains of heartburn. He reports no chest pain, no choking, no coughing or no wheezing. This is a chronic problem. The problem occurs occasionally. Pertinent negatives include no fatigue. He has tried a PPI for the symptoms. The treatment provided significant relief.  Thyroid Problem  Presents for follow-up visit. Patient reports no anxiety, fatigue, leg swelling, palpitations, tremors or weight gain. The symptoms have been worsening (he admits to not taking synthroid every day).  Insomnia  Primary symptoms: sleep disturbance.  The current episode started more than one year. The problem occurs nightly. The problem is unchanged. Past treatments include medication (high dose trazodone.  He liked Belsomra but could not afford it).    Review of Systems  Constitutional: Negative for chills, fatigue, fever, unexpected weight change and weight gain.  Respiratory: Negative for cough, choking, wheezing and stridor.   Cardiovascular: Negative for chest pain and palpitations.  Gastrointestinal: Positive for heartburn.  Musculoskeletal: Positive for arthralgias (left knee OA) and back pain.  Skin: Negative for rash.  Neurological: Negative for tremors.  Psychiatric/Behavioral: Positive for sleep disturbance. Negative for dysphoric mood. The patient has insomnia. The patient is not nervous/anxious.     Patient Active Problem List   Diagnosis Date Noted  . Constipation 07/09/2017  . Impingement syndrome of right shoulder region 05/13/2017  . Iron deficiency anemia due to chronic blood loss 02/27/2017  . B12 deficiency 01/09/2017  . 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 Osteoarthritis of both knees 01/09/2017  . Insomnia 12/12/2016  . Hx of gastric bypass 12/12/2016  . Cirrhosis of  liver ([place]) 11/19/2016  . Positive Helicobacter pylori serology 11/19/2016  . Sleep apnea 11/19/2016  . Von Willebrand disease ([place]) 11/19/2016  . Anastomotic ulcer S/P gastric bypass   . Obesity (BMI 30.0-34.9) 01/19/2014  . Nondependent alcohol abuse 01/11/2010  . Hypothyroidism 01/11/2010  . Hyperlipidemia 01/11/2010  . Coronary atherosclerosis of native coronary artery 09/22/2008  . Essential hypertension 08/24/2008    No Known Allergies  Past Surgical History:  Procedure Laterality Date  . BILATERAL TOTAL SHOULDER ARTHROPLASTY  1990's  . CIRCUMCISION REVISION N/A 03/13/2017   Procedure: CIRCUMCISION REVISION;  Surgeon: Nickie Retort, MD;  Location: [facility] ORS;  Service: Urology;  Laterality: N/A;  . COLONOSCOPY WITH PROPOFOL N/A 10/30/2016   Procedure: COLONOSCOPY WITH PROPOFOL;  Surgeon: Lucilla Lame, MD;  Location: [facility] ENDOSCOPY;  Service: Endoscopy;  Laterality: N/A;  . ESOPHAGOGASTRODUODENOSCOPY (EGD) WITH PROPOFOL N/A 10/30/2016   Procedure: ESOPHAGOGASTRODUODENOSCOPY (EGD) WITH PROPOFOL;  Surgeon: Lucilla Lame, MD;  Location: [facility] ENDOSCOPY;  Service: Endoscopy;  Laterality: N/A;  . ESOPHAGOGASTRODUODENOSCOPY (EGD) WITH PROPOFOL N/A 03/26/2017   Procedure: ESOPHAGOGASTRODUODENOSCOPY (EGD) WITH PROPOFOL;  Surgeon: Lucilla Lame, MD;  Location: [place];  Service: Gastroenterology;  Laterality: N/A;  requests early  . GASTRIC BYPASS  2016   Dr. Darnell Level- [facility], [place] ARTHROSCOPY  1990s  . KNEE ARTHROSCOPY W/ MENISCAL REPAIR Bilateral   . TONSILLECTOMY  2004    Social History   Tobacco Use  . Smoking status: Never Smoker  . Smokeless tobacco: Current User    Types: Chew  Substance Use Topics  . Alcohol use: No    Comment: Heavy ETOH in past- Last Drink 2011 per patient  . Drug use: No  Medication list has been reviewed and updated.  Current Meds  Medication Sig  . lactulose (CHRONULAC) 10 GM/15ML solution TAKE 15MLS TWICE A DAY  . levothyroxine  (SYNTHROID, LEVOTHROID) 150 MCG tablet Take 1 tablet (150 mcg total) by mouth daily before breakfast.  . Multiple Vitamin (MULTIVITAMIN) capsule Take 1 capsule by mouth daily.  . pantoprazole (PROTONIX) 40 MG tablet Take 1 tablet (40 mg total) by mouth 2 (two) times daily.  . traZODone (DESYREL) 150 MG tablet TAKE 4 TABLETS BY MOUTH AT BEDTIME  . vitamin B-12 (CYANOCOBALAMIN) 500 MCG tablet Take 1,000 mcg by mouth daily.     PHQ 2/9 Scores 12/09/2017 01/09/2017 12/12/2016  PHQ - 2 Score 0 0 0    Physical Exam  Constitutional: He is oriented to person, place, and time. He appears well-developed. No distress.  HENT:  Head: Normocephalic and atraumatic.  Neck: Normal range of motion. Neck supple. Carotid bruit is not present.  Cardiovascular: Normal rate, regular rhythm and normal heart sounds.  Pulmonary/Chest: Effort normal and breath sounds normal. No respiratory distress.  Musculoskeletal: He exhibits no edema.       Left knee: He exhibits no swelling and no effusion. Tenderness found. Medial joint line tenderness noted.  Lymphadenopathy:    He has no cervical adenopathy.  Neurological: He is alert and oriented to person, place, and time.  Skin: Skin is warm and dry. No rash noted.  Psychiatric: He has a normal mood and affect. His behavior is normal. Thought content normal.  Nursing note and vitals reviewed.   BP 106/76   Pulse 68   Resp 16   Ht 5\' 10"  (1.778 m)   Wt 207 lb (93.9 kg)   SpO2 100%   BMI 29.70 kg/m   Assessment and Plan: 1. Acquired hypothyroidism Take meds first thing in the morning with only water and nothing else for 1 hour - levothyroxine (SYNTHROID, LEVOTHROID) 150 MCG tablet; Take 1 tablet (150 mcg total) by mouth daily before breakfast.  Dispense: 90 tablet; Refill: 1  2. Primary insomnia Continue high dose trazodone - traZODone (DESYREL) 150 MG tablet; Take 4 tablets (600 mg total) by mouth at bedtime.  Dispense: 360 tablet; Refill: 1  3.  Gastrointestinal ulcer Recommend daily PPI indefinitely - pantoprazole (PROTONIX) 40 MG tablet; Take 1 tablet (40 mg total) by mouth 2 (two) times daily.  Dispense: 180 tablet; Refill: 3  4. Primary osteoarthritis of both knees Followed by Orthopedics   Partially dictated using Editor, commissioning. Any errors are unintentional.  Halina Maidens, MD [facility]  05/17/2018

## 2018-07-12 DIAGNOSIS — D691 Qualitative platelet defects: Secondary | ICD-10-CM | POA: Diagnosis not present

## 2018-07-12 DIAGNOSIS — Z9884 Bariatric surgery status: Secondary | ICD-10-CM | POA: Diagnosis not present

## 2018-07-12 DIAGNOSIS — I25118 Atherosclerotic heart disease of native coronary artery with other forms of angina pectoris: Secondary | ICD-10-CM | POA: Diagnosis not present

## 2018-07-12 DIAGNOSIS — E039 Hypothyroidism, unspecified: Secondary | ICD-10-CM | POA: Diagnosis not present

## 2018-09-24 DIAGNOSIS — M62830 Muscle spasm of back: Secondary | ICD-10-CM | POA: Diagnosis not present

## 2018-09-24 DIAGNOSIS — M5431 Sciatica, right side: Secondary | ICD-10-CM | POA: Diagnosis not present

## 2018-09-24 DIAGNOSIS — M545 Low back pain: Secondary | ICD-10-CM | POA: Diagnosis not present

## 2018-09-24 DIAGNOSIS — M9903 Segmental and somatic dysfunction of lumbar region: Secondary | ICD-10-CM | POA: Diagnosis not present

## 2019-01-25 DIAGNOSIS — M7542 Impingement syndrome of left shoulder: Secondary | ICD-10-CM | POA: Diagnosis not present

## 2019-01-25 DIAGNOSIS — S83207A Unspecified tear of unspecified meniscus, current injury, left knee, initial encounter: Secondary | ICD-10-CM | POA: Diagnosis not present

## 2019-01-28 DIAGNOSIS — S83209A Unspecified tear of unspecified meniscus, current injury, unspecified knee, initial encounter: Secondary | ICD-10-CM | POA: Insufficient documentation

## 2019-01-31 DIAGNOSIS — Z01818 Encounter for other preprocedural examination: Secondary | ICD-10-CM | POA: Diagnosis not present

## 2019-01-31 DIAGNOSIS — S83207A Unspecified tear of unspecified meniscus, current injury, left knee, initial encounter: Secondary | ICD-10-CM | POA: Diagnosis not present

## 2019-02-01 ENCOUNTER — Ambulatory Visit: Payer: Self-pay | Admitting: Orthopedic Surgery

## 2019-02-08 DIAGNOSIS — Z9884 Bariatric surgery status: Secondary | ICD-10-CM | POA: Diagnosis not present

## 2019-02-08 DIAGNOSIS — D696 Thrombocytopenia, unspecified: Secondary | ICD-10-CM | POA: Diagnosis not present

## 2019-02-08 DIAGNOSIS — I25118 Atherosclerotic heart disease of native coronary artery with other forms of angina pectoris: Secondary | ICD-10-CM | POA: Diagnosis not present

## 2019-02-08 DIAGNOSIS — E039 Hypothyroidism, unspecified: Secondary | ICD-10-CM | POA: Diagnosis not present

## 2019-02-17 ENCOUNTER — Ambulatory Visit: Admit: 2019-02-17 | Payer: Self-pay | Admitting: Orthopedic Surgery

## 2019-02-17 SURGERY — ARTHROSCOPY, KNEE
Anesthesia: General | Site: Knee | Laterality: Left

## 2019-03-15 DIAGNOSIS — E039 Hypothyroidism, unspecified: Secondary | ICD-10-CM | POA: Diagnosis not present

## 2019-03-23 ENCOUNTER — Other Ambulatory Visit: Payer: Self-pay | Admitting: Orthopedic Surgery

## 2019-03-28 DIAGNOSIS — E039 Hypothyroidism, unspecified: Secondary | ICD-10-CM | POA: Diagnosis not present

## 2019-03-28 DIAGNOSIS — D696 Thrombocytopenia, unspecified: Secondary | ICD-10-CM | POA: Diagnosis not present

## 2019-03-28 DIAGNOSIS — I25118 Atherosclerotic heart disease of native coronary artery with other forms of angina pectoris: Secondary | ICD-10-CM | POA: Diagnosis not present

## 2019-04-12 DIAGNOSIS — M7542 Impingement syndrome of left shoulder: Secondary | ICD-10-CM | POA: Diagnosis not present

## 2019-04-12 DIAGNOSIS — M25512 Pain in left shoulder: Secondary | ICD-10-CM | POA: Diagnosis not present

## 2019-04-14 NOTE — Anesthesia Preprocedure Evaluation (Addendum)
Anesthesia Evaluation  Patient identified by MRN, date of birth, ID band Patient awake    Reviewed: Allergy & Precautions, NPO status , Patient's Chart, lab work & pertinent test results  History of Anesthesia Complications Negative for: history of anesthetic complications  Airway Mallampati: II  TM Distance: <3 FB Neck ROM: Limited    Dental   Pulmonary sleep apnea ,    breath sounds clear to auscultation       Cardiovascular hypertension, (-) angina+ CAD  (-) DOE  Rhythm:Regular Rate:Normal   HLD   Neuro/Psych PSYCHIATRIC DISORDERS  Neuromuscular disease    GI/Hepatic PUD, GERD  ,(+) Cirrhosis     substance abuse (Sober since 2011)  alcohol use,  S/p gastric bypass   Endo/Other  Hypothyroidism   Renal/GU      Musculoskeletal  (+) Arthritis ,   Abdominal (+) + obese (BMI 31),   Peds  Hematology  (+) anemia , Thrombocytopenia, plt 105 on 04/19/19   Anesthesia Other Findings Gout  Reproductive/Obstetrics                          Anesthesia Physical Anesthesia Plan  ASA: III  Anesthesia Plan: General   Post-op Pain Management:    Induction: Intravenous  PONV Risk Score and Plan: 2 and Ondansetron and Treatment may vary due to age or medical condition  Airway Management Planned: LMA  Additional Equipment:   Intra-op Plan:   Post-operative Plan: Extubation in OR  Informed Consent: I have reviewed the patients History and Physical, chart, labs and discussed the procedure including the risks, benefits and alternatives for the proposed anesthesia with the patient or authorized representative who has indicated his/her understanding and acceptance.       Plan Discussed with: CRNA and Anesthesiologist  Anesthesia Plan Comments:         Anesthesia Quick Evaluation

## 2019-04-18 ENCOUNTER — Other Ambulatory Visit
Admission: RE | Admit: 2019-04-18 | Discharge: 2019-04-18 | Disposition: A | Discharge status: Home / Self Care | Payer: Medicare HMO | Source: Ambulatory Visit | Attending: Orthopedic Surgery | Admitting: Orthopedic Surgery

## 2019-04-18 DIAGNOSIS — Z20828 Contact with and (suspected) exposure to other viral communicable diseases: Secondary | ICD-10-CM | POA: Diagnosis not present

## 2019-04-18 DIAGNOSIS — Z01812 Encounter for preprocedural laboratory examination: Secondary | ICD-10-CM | POA: Diagnosis not present

## 2019-04-18 LAB — SARS CORONAVIRUS 2 (TAT 6-24 HRS): SARS Coronavirus 2: NEGATIVE

## 2019-04-19 ENCOUNTER — Other Ambulatory Visit
Admission: RE | Admit: 2019-04-19 | Discharge: 2019-04-19 | Disposition: A | Discharge status: Home / Self Care | Payer: Medicare HMO | Source: Ambulatory Visit | Attending: Orthopedic Surgery | Admitting: Orthopedic Surgery

## 2019-04-19 DIAGNOSIS — Z01818 Encounter for other preprocedural examination: Secondary | ICD-10-CM | POA: Diagnosis present

## 2019-04-19 LAB — CBC
HCT: 38.5 % — ABNORMAL LOW (ref 39.0–52.0)
Hemoglobin: 13.3 g/dL (ref 13.0–17.0)
MCH: 30.5 pg (ref 26.0–34.0)
MCHC: 34.5 g/dL (ref 30.0–36.0)
MCV: 88.3 fL (ref 80.0–100.0)
Platelets: 105 10*3/uL — ABNORMAL LOW (ref 150–400)
RBC: 4.36 MIL/uL (ref 4.22–5.81)
RDW: 12.6 % (ref 11.5–15.5)
WBC: 3.5 10*3/uL — ABNORMAL LOW (ref 4.0–10.5)
nRBC: 0 % (ref 0.0–0.2)

## 2019-04-19 LAB — URINALYSIS, ROUTINE W REFLEX MICROSCOPIC
Bilirubin Urine: NEGATIVE
Glucose, UA: NEGATIVE mg/dL
Hgb urine dipstick: NEGATIVE
Ketones, ur: NEGATIVE mg/dL
Leukocytes,Ua: NEGATIVE
Nitrite: NEGATIVE
Protein, ur: NEGATIVE mg/dL
Specific Gravity, Urine: 1.017 (ref 1.005–1.030)
pH: 5 (ref 5.0–8.0)

## 2019-04-19 LAB — BASIC METABOLIC PANEL
Anion gap: 7 (ref 5–15)
BUN: 15 mg/dL (ref 8–23)
CO2: 28 mmol/L (ref 22–32)
Calcium: 9 mg/dL (ref 8.9–10.3)
Chloride: 107 mmol/L (ref 98–111)
Creatinine, Ser: 1.19 mg/dL (ref 0.61–1.24)
GFR calc Af Amer: >60 mL/min (ref >60)
GFR calc non Af Amer: >60 mL/min (ref >60)
Glucose, Bld: 101 mg/dL — ABNORMAL HIGH (ref 70–99)
Potassium: 3.9 mmol/L (ref 3.5–5.1)
Sodium: 142 mmol/L (ref 135–145)

## 2019-04-19 LAB — PROTIME-INR
INR: 1.1 (ref 0.8–1.2)
Prothrombin Time: 13.9 seconds (ref 11.4–15.2)

## 2019-04-19 LAB — APTT: aPTT: 32 seconds (ref 24–36)

## 2019-04-21 ENCOUNTER — Encounter
Admission: RE | Disposition: A | Discharge status: Home / Self Care | Payer: Self-pay | Source: Home / Self Care | Attending: Orthopedic Surgery

## 2019-04-21 ENCOUNTER — Ambulatory Visit: Payer: Medicare HMO | Admitting: Anesthesiology

## 2019-04-21 ENCOUNTER — Ambulatory Visit
Admission: RE | Admit: 2019-04-21 | Discharge: 2019-04-21 | Disposition: A | Discharge status: Home / Self Care | Payer: Medicare HMO | Attending: Orthopedic Surgery | Admitting: Orthopedic Surgery

## 2019-04-21 ENCOUNTER — Other Ambulatory Visit: Payer: Self-pay

## 2019-04-21 DIAGNOSIS — S83282A Other tear of lateral meniscus, current injury, left knee, initial encounter: Secondary | ICD-10-CM | POA: Diagnosis not present

## 2019-04-21 DIAGNOSIS — Z9884 Bariatric surgery status: Secondary | ICD-10-CM | POA: Diagnosis not present

## 2019-04-21 DIAGNOSIS — M2242 Chondromalacia patellae, left knee: Secondary | ICD-10-CM | POA: Diagnosis not present

## 2019-04-21 DIAGNOSIS — K219 Gastro-esophageal reflux disease without esophagitis: Secondary | ICD-10-CM | POA: Diagnosis not present

## 2019-04-21 DIAGNOSIS — M2342 Loose body in knee, left knee: Secondary | ICD-10-CM | POA: Insufficient documentation

## 2019-04-21 DIAGNOSIS — Z79890 Hormone replacement therapy (postmenopausal): Secondary | ICD-10-CM | POA: Diagnosis not present

## 2019-04-21 DIAGNOSIS — D696 Thrombocytopenia, unspecified: Secondary | ICD-10-CM | POA: Insufficient documentation

## 2019-04-21 DIAGNOSIS — M1990 Unspecified osteoarthritis, unspecified site: Secondary | ICD-10-CM | POA: Insufficient documentation

## 2019-04-21 DIAGNOSIS — Z8711 Personal history of peptic ulcer disease: Secondary | ICD-10-CM | POA: Insufficient documentation

## 2019-04-21 DIAGNOSIS — G4730 Sleep apnea, unspecified: Secondary | ICD-10-CM | POA: Insufficient documentation

## 2019-04-21 DIAGNOSIS — I2510 Atherosclerotic heart disease of native coronary artery without angina pectoris: Secondary | ICD-10-CM | POA: Diagnosis not present

## 2019-04-21 DIAGNOSIS — S83207A Unspecified tear of unspecified meniscus, current injury, left knee, initial encounter: Secondary | ICD-10-CM | POA: Insufficient documentation

## 2019-04-21 DIAGNOSIS — X58XXXA Exposure to other specified factors, initial encounter: Secondary | ICD-10-CM | POA: Insufficient documentation

## 2019-04-21 DIAGNOSIS — Z79899 Other long term (current) drug therapy: Secondary | ICD-10-CM | POA: Insufficient documentation

## 2019-04-21 DIAGNOSIS — D680 Von Willebrand's disease: Secondary | ICD-10-CM | POA: Insufficient documentation

## 2019-04-21 DIAGNOSIS — K7460 Unspecified cirrhosis of liver: Secondary | ICD-10-CM | POA: Insufficient documentation

## 2019-04-21 DIAGNOSIS — G709 Myoneural disorder, unspecified: Secondary | ICD-10-CM | POA: Insufficient documentation

## 2019-04-21 DIAGNOSIS — S83242A Other tear of medial meniscus, current injury, left knee, initial encounter: Secondary | ICD-10-CM | POA: Diagnosis not present

## 2019-04-21 DIAGNOSIS — I10 Essential (primary) hypertension: Secondary | ICD-10-CM | POA: Diagnosis not present

## 2019-04-21 DIAGNOSIS — E039 Hypothyroidism, unspecified: Secondary | ICD-10-CM | POA: Diagnosis not present

## 2019-04-21 HISTORY — PX: KNEE ARTHROSCOPY WITH MEDIAL MENISECTOMY: SHX5651

## 2019-04-21 SURGERY — ARTHROSCOPY, KNEE, WITH MEDIAL MENISCECTOMY
Anesthesia: General | Site: Knee | Laterality: Left

## 2019-04-21 MED ORDER — ONDANSETRON HCL 4 MG/2ML IJ SOLN
INTRAMUSCULAR | Status: DC | PRN
  Administered 2019-04-21: 4 mg via INTRAVENOUS

## 2019-04-21 MED ORDER — GLYCOPYRROLATE 0.2 MG/ML IJ SOLN
INTRAMUSCULAR | Status: DC | PRN
  Administered 2019-04-21: 0.1 mg via INTRAVENOUS

## 2019-04-21 MED ORDER — ROPIVACAINE HCL 5 MG/ML IJ SOLN
INTRAMUSCULAR | Status: DC | PRN
  Administered 2019-04-21: 20 mL

## 2019-04-21 MED ORDER — TRIAMCINOLONE ACETONIDE 40 MG/ML IJ SUSP
INTRAMUSCULAR | Status: DC | PRN
  Administered 2019-04-21: 40 mg via INTRA_ARTICULAR

## 2019-04-21 MED ORDER — DEXAMETHASONE SODIUM PHOSPHATE 4 MG/ML IJ SOLN
INTRAMUSCULAR | Status: DC | PRN
  Administered 2019-04-21: 4 mg via INTRAVENOUS

## 2019-04-21 MED ORDER — DEXTROSE 5 % IV SOLN
3.0000 g | INTRAVENOUS | Status: AC
  Administered 2019-04-21: 13:00:00 3 g via INTRAVENOUS

## 2019-04-21 MED ORDER — HYDROCODONE-ACETAMINOPHEN 5-325 MG PO TABS: 1.0000 | ORAL_TABLET | ORAL | 0 refills | Status: DC | PRN

## 2019-04-21 MED ORDER — ACETAMINOPHEN 500 MG PO TABS
1000.0000 mg | ORAL_TABLET | Freq: Once | ORAL | Status: AC
  Administered 2019-04-21: 325 mg via ORAL

## 2019-04-21 MED ORDER — EPHEDRINE SULFATE 50 MG/ML IJ SOLN
INTRAMUSCULAR | Status: DC | PRN
  Administered 2019-04-21: 10 mg via INTRAVENOUS

## 2019-04-21 MED ORDER — LACTATED RINGERS IV SOLN: INTRAVENOUS | Status: DC

## 2019-04-21 MED ORDER — PROPOFOL 10 MG/ML IV BOLUS
INTRAVENOUS | Status: DC | PRN
  Administered 2019-04-21: 200 mg via INTRAVENOUS

## 2019-04-21 MED ORDER — OXYCODONE HCL 5 MG PO TABS: 5.0000 mg | ORAL_TABLET | Freq: Once | ORAL | Status: DC | PRN

## 2019-04-21 MED ORDER — LACTATED RINGERS IV SOLN
100.0000 mL/h | INTRAVENOUS | Status: DC
  Administered 2019-04-21: 100 mL/h via INTRAVENOUS

## 2019-04-21 MED ORDER — LIDOCAINE HCL (CARDIAC) PF 100 MG/5ML IV SOSY
PREFILLED_SYRINGE | INTRAVENOUS | Status: DC | PRN
  Administered 2019-04-21: 30 mg via INTRATRACHEAL

## 2019-04-21 MED ORDER — BUPIVACAINE-EPINEPHRINE 0.5% -1:200000 IJ SOLN
INTRAMUSCULAR | Status: DC | PRN
  Administered 2019-04-21: 8 mL

## 2019-04-21 MED ORDER — LACTATED RINGERS IV SOLN
INTRAVENOUS | Status: DC | PRN
  Administered 2019-04-21 (×2): 3000 mL

## 2019-04-21 MED ORDER — PROMETHAZINE HCL 25 MG/ML IJ SOLN: 6.2500 mg | INTRAMUSCULAR | Status: DC | PRN

## 2019-04-21 MED ORDER — POVIDONE-IODINE 10 % EX SWAB: 2.0000 "application " | Freq: Once | CUTANEOUS | Status: DC

## 2019-04-21 MED ORDER — MIDAZOLAM HCL 5 MG/5ML IJ SOLN
INTRAMUSCULAR | Status: DC | PRN
  Administered 2019-04-21: 2 mg via INTRAVENOUS

## 2019-04-21 MED ORDER — HYDROMORPHONE HCL 1 MG/ML IJ SOLN: 0.2500 mg | INTRAMUSCULAR | Status: DC | PRN

## 2019-04-21 MED ORDER — CHLORHEXIDINE GLUCONATE 4 % EX LIQD: 60.0000 mL | Freq: Once | CUTANEOUS | Status: DC

## 2019-04-21 MED ORDER — FENTANYL CITRATE (PF) 100 MCG/2ML IJ SOLN
INTRAMUSCULAR | Status: DC | PRN
  Administered 2019-04-21: 25 ug via INTRAVENOUS
  Administered 2019-04-21: 50 ug via INTRAVENOUS
  Administered 2019-04-21: 25 ug via INTRAVENOUS

## 2019-04-21 MED ORDER — OXYCODONE HCL 5 MG/5ML PO SOLN: 5.0000 mg | Freq: Once | ORAL | Status: DC | PRN

## 2019-04-21 SURGICAL SUPPLY — 37 items
ADAPTER IRRIG TUBE 2 SPIKE SOL (ADAPTER) ×4 IMPLANT
BLADE AGGRESSIVE PLUS 4.0 (BLADE) ×2 IMPLANT
BLADE FULL RADIUS 3.5 (BLADE) IMPLANT
BLADE SHAVER 4.5 DBL SERAT CV (CUTTER) IMPLANT
BLADE SURG SZ11 CARB STEEL (BLADE) ×2 IMPLANT
BRUSH SCRUB EZ  4% CHG (MISCELLANEOUS) ×1
BRUSH SCRUB EZ 4% CHG (MISCELLANEOUS) ×1 IMPLANT
CHLORAPREP W/TINT 26 (MISCELLANEOUS) ×2 IMPLANT
COOLER POLAR GLACIER W/PUMP (MISCELLANEOUS) ×2 IMPLANT
COVER LIGHT HANDLE UNIVERSAL (MISCELLANEOUS) ×4 IMPLANT
DRAPE IMP U-DRAPE 54X76 (DRAPES) ×2 IMPLANT
GAUZE SPONGE 4X4 12PLY STRL (GAUZE/BANDAGES/DRESSINGS) ×2 IMPLANT
GLOVE INDICATOR 8.0 STRL GRN (GLOVE) ×2 IMPLANT
GLOVE SURG ORTHO 8.0 STRL STRW (GLOVE) ×2 IMPLANT
GOWN STRL REUS W/ TWL LRG LVL3 (GOWN DISPOSABLE) ×1 IMPLANT
GOWN STRL REUS W/ TWL XL LVL3 (GOWN DISPOSABLE) ×1 IMPLANT
GOWN STRL REUS W/TWL LRG LVL3 (GOWN DISPOSABLE) ×1
GOWN STRL REUS W/TWL XL LVL3 (GOWN DISPOSABLE) ×1
IV LACTATED RINGER IRRG 3000ML (IV SOLUTION) ×2
IV LR IRRIG 3000ML ARTHROMATIC (IV SOLUTION) ×2 IMPLANT
KIT TURNOVER KIT A (KITS) ×2 IMPLANT
MANIFOLD NEPTUNE II (INSTRUMENTS) ×2 IMPLANT
MAT ABSORB  FLUID 56X50 GRAY (MISCELLANEOUS) ×1
MAT ABSORB FLUID 56X50 GRAY (MISCELLANEOUS) ×1 IMPLANT
NDL HYPO TW 22X1.5 (NEEDLE) ×2 IMPLANT
NEEDLE 18GX1X1/2 (RX/OR ONLY) (NEEDLE) ×2 IMPLANT
PACK ARTHROSCOPY KNEE (MISCELLANEOUS) ×2 IMPLANT
PAD ABD DERMACEA PRESS 5X9 (GAUZE/BANDAGES/DRESSINGS) ×4 IMPLANT
PAD WRAPON POLAR KNEE (MISCELLANEOUS) ×1 IMPLANT
SCALPEL PROTECTED #11 DISP (BLADE) ×2 IMPLANT
SUT ETHILON 4-0 (SUTURE) ×1
SUT ETHILON 4-0 FS2 18XMFL BLK (SUTURE) ×1
SUTURE ETHLN 4-0 FS2 18XMF BLK (SUTURE) ×1 IMPLANT
SYR 10ML LL (SYRINGE) ×2 IMPLANT
TUBING ARTHRO INFLOW-ONLY STRL (TUBING) ×2 IMPLANT
WAND WEREWOLF FLOW 90D (MISCELLANEOUS) ×2 IMPLANT
WRAPON POLAR PAD KNEE (MISCELLANEOUS) ×2

## 2019-04-21 NOTE — Anesthesia Postprocedure Evaluation (Signed)
Anesthesia Post Note  Patient: Dylan Ho  Procedure(s) Performed: KNEE ARTHROSCOPY WITH MEDIAL MENISECTOMY (Left Knee)  Patient location during evaluation: PACU Anesthesia Type: General Level of consciousness: awake and alert Pain management: pain level controlled Vital Signs Assessment: post-procedure vital signs reviewed and stable Respiratory status: spontaneous breathing, nonlabored ventilation, respiratory function stable and patient connected to nasal cannula oxygen Cardiovascular status: blood pressure returned to baseline and stable Postop Assessment: no apparent nausea or vomiting Anesthetic complications: no    Kary Colaizzi A  Tynetta Bachmann

## 2019-04-21 NOTE — Op Note (Signed)
PATIENT:  Dylan Ho  PRE-OPERATIVE DIAGNOSIS:  S83.207A unspecified tear of unspecified meniscus current injury left knee  POST-OPERATIVE DIAGNOSIS:  Same  PROCEDURE:  LEFT KNEE ARTHROSCOPY WITH MEDIAL AND LATERAL MENISECTOMY, PARTIAL SYNOVECTOMY, REMOVAL OF LOOSE BODIES  SURGEON:  Kurtis Bushman, MD  ANESTHESIA:   General  PREOPERATIVE INDICATIONS:  Dylan Ho  68 y.o. male with a diagnosis of S83.207A unspecified tear of unspecified meniscus current injury left knee who failed conservative management and elected for surgical management.    The risks benefits and alternatives were discussed with the patient preoperatively including the risks of infection, bleeding, nerve injury, knee stiffness, persistent pain, osteoarthritis and the need for further surgery. Medical  risks include DVT and pulmonary embolism, myocardial infarction, stroke, pneumonia, respiratory failure and death. The patient understood these risks and wished to proceed.   OPERATIVE FINDINGS: The suprapatellar pouch, medial and lateral gutters were inspected and found to be free of any loose bodies. The undersurface of the patella and landing zone were inspected and grade 2 and 3 chondromalacia was noted. There was no evidence of lateral subluxation. The medial compartment showed a complex tear of the posterior horn, then anterior horn was intact and stable to probing. 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  The notch was inspected and the ACL and PCL were intact and stable to probing. Twio loose bodies 1 cm in diameter were identified within the notch. The lateral compartment was entered and minimal degenerative changes were identified. The lateral meniscus had degenerative tearing along the posterior horn. The anterior horn was intact a stable.  OPERATIVE PROCEDURE: Patient was met in the preoperative area. The operative extremity was signed with my initials according the hospital's correct  site of surgery protocol.  The patient was brought to the operating room where they was placed supine on the operative table. General anesthesia was administered. The patient was prepped and draped in a sterile fashion.  A timeout was performed to verify the patient's name, date of birth, medical record number, correct site of surgery correct procedure to be performed. It was also used to verify the patient received antibiotics that all appropriate instruments, and radiographic studies were available in the room. Once all in attendance were in agreement, the case began.  Proposed arthroscopy incisions were drawn out with a surgical marker. These were pre-injected with 0.5% marcaine with epinephrine. An 11 blade was used to establish an inferior lateral and inferomedial portals. The inferomedial portal was created using a 18-gauge spinal needle under direct visualization.  A full diagnostic examination of the knee was performed, please see findings for a complete list of results.  Patient had the meniscal tear treated with a 4-0 resector shaver blade and straight duckbill basket. The meniscus was debrided until a stable rim was achieved. A chondroplasty was performed in all three compartments using the shaver on reverse burr mode. A partial synovectomy was also performed in all three compartments using a 4-0 resector shaver blade and electrocautery. The two loose bodies were removed with a grasper.  The knee was then copiously lavaged. All arthroscopic instruments were removed. The 2 arthroscopy portals were closed with 4-0 nylon. A dry sterile and compressive dressing was applied. The patient was brought to the PACU in stable condition. I was scrubbed and present for the entire case and all sharp and instrument counts were correct at the conclusion the case. I spoke with the patient's family postoperatively to let them know the case was  performed without complication and the patient was stable in the  recovery room.  Kurtis Bushman, MD

## 2019-04-21 NOTE — H&P (Signed)
The patient has been re-examined, and the chart reviewed, and there have been no interval changes to the documented history and physical.  Plan a left knee scope today.  Anesthesia is not consulted regarding a peripheral nerve block for post-operative pain.  The risks, benefits, and alternatives have been discussed at length, and the patient is willing to proceed.     

## 2019-04-21 NOTE — Transfer of Care (Signed)
Immediate Anesthesia Transfer of Care Note  Patient: Dylan Ho  Procedure(s) Performed: KNEE ARTHROSCOPY WITH MEDIAL MENISECTOMY (Left Knee)  Patient Location: PACU  Anesthesia Type: General  Level of Consciousness: awake, alert  and patient cooperative  Airway and Oxygen Therapy: Patient Spontanous Breathing and Patient connected to supplemental oxygen  Post-op Assessment: Post-op Vital signs reviewed, Patient's Cardiovascular Status Stable, Respiratory Function Stable, Patent Airway and No signs of Nausea or vomiting  Post-op Vital Signs: Reviewed and stable  Complications: No apparent anesthesia complications

## 2019-04-21 NOTE — Anesthesia Procedure Notes (Signed)
Procedure Name: LMA Insertion Date/Time: 04/21/2019 12:45 PM Performed by: Cameron Ali, CRNA Pre-anesthesia Checklist: Patient identified, Emergency Drugs available, Suction available, Timeout performed and Patient being monitored Patient Re-evaluated:Patient Re-evaluated prior to induction Oxygen Delivery Method: Circle system utilized Preoxygenation: Pre-oxygenation with 100% oxygen Induction Type: IV induction LMA: LMA inserted LMA Size: 5.0 Number of attempts: 1 Placement Confirmation: positive ETCO2 and breath sounds checked- equal and bilateral Tube secured with: Tape Dental Injury: Teeth and Oropharynx as per pre-operative assessment

## 2019-04-21 NOTE — Discharge Instructions (Signed)
Post Op Home Instructions for Knee Arthroscopy  1) Do not sit for longer than 1 hour at a time with your leg dangling down.  You should have your legs elevated (higher than your heart) in a recliner chair or couch.  2) You may be up walking around as tolerated but should take periodic breaks to elevate your legs.  Discontinue use of crutches when you feel you are able to walk without pain or a limp.  3) Work on gentle bending and straightening of the knee.  4) You may remove the Ace wrap and dressings two days after surgery.  Place band aids over the incision sites.  5) You may shower after you remove the surgical dressing.  You do not need to cover the incision with plastic wrap.  The incision can get wet, but do not submerge under water.  After your sutures have been removed, you should wait 24 hours before submerging incision under water.  6) Pain medication can cause constipation.  You should increase your fluid intake, increase your intake of high fiber foods and/or take Metamucil as needed for constipation.  7) Continue your physical therapy exercises, as shown at the office, at least twice daily.  You should set up outpatient physical therapy and start within the first week after surgery.  8) Continue to use your Polar Pack continuously for 2-3 days after surgery.  After you remove the surgical dressing, it is a good idea to use your Polar Pack or ice pack for 30 minutes after doing your exercises to reduce swelling.  9) Do not be surprised if you have increased pain at night.  This usually means you have been a [place] too active during the day and need to reduce your activities.  10) If you develop lower extremity swelling that does not improve after a night of elevation, please call the office.  This could be an early sign of a blood clot.  Please call with any questions at [phone]   General Anesthesia, Adult, Care After This sheet gives you information about how to care for  yourself after your procedure. Your health care provider may also give you more specific instructions. If you have problems or questions, contact your health care provider. What can I expect after the procedure? After the procedure, the following side effects are common:  Pain or discomfort at the IV site.  Nausea.  Vomiting.  Sore throat.  Trouble concentrating.  Feeling cold or chills.  Weak or tired.  Sleepiness and fatigue.  Soreness and body aches. These side effects can affect parts of the body that were not involved in surgery. Follow these instructions at home:  For at least 24 hours after the procedure:  Have a responsible adult stay with you. It is important to have someone help care for you until you are awake and alert.  Rest as needed.  Do not: ? Participate in activities in which you could fall or become injured. ? Drive. ? Use heavy machinery. ? Drink alcohol. ? Take sleeping pills or medicines that cause drowsiness. ? Make important decisions or sign legal documents. ? Take care of children on your own. Eating and drinking  Follow any instructions from your health care provider about eating or drinking restrictions.  When you feel hungry, start by eating small amounts of foods that are soft and easy to digest (bland), such as toast. Gradually return to your regular diet.  Drink enough fluid to keep your urine pale yellow.  If you vomit, rehydrate by drinking water, juice, or clear broth. General instructions  If you have sleep apnea, surgery and certain medicines can increase your risk for breathing problems. Follow instructions from your health care provider about wearing your sleep device: ? Anytime you are sleeping, including during daytime naps. ? While taking prescription pain medicines, sleeping medicines, or medicines that make you drowsy.  Return to your normal activities as told by your health care provider. Ask your health care provider  what activities are safe for you.  Take over-the-counter and prescription medicines only as told by your health care provider.  If you smoke, do not smoke without supervision.  Keep all follow-up visits as told by your health care provider. This is important. Contact a health care provider if:  You have nausea or vomiting that does not get better with medicine.  You cannot eat or drink without vomiting.  You have pain that does not get better with medicine.  You are unable to pass urine.  You develop a skin rash.  You have a fever.  You have redness around your IV site that gets worse. Get help right away if:  You have difficulty breathing.  You have chest pain.  You have blood in your urine or stool, or you vomit blood. Summary  After the procedure, it is common to have a sore throat or nausea. It is also common to feel tired.  Have a responsible adult stay with you for the first 24 hours after general anesthesia. It is important to have someone help care for you until you are awake and alert.  When you feel hungry, start by eating small amounts of foods that are soft and easy to digest (bland), such as toast. Gradually return to your regular diet.  Drink enough fluid to keep your urine pale yellow.  Return to your normal activities as told by your health care provider. Ask your health care provider what activities are safe for you. This information is not intended to replace advice given to you by your health care provider. Make sure you discuss any questions you have with your health care provider. Document Released: 09/29/2000 Document Revised: 06/26/2017 Document Reviewed: 02/06/2017 Elsevier Patient Education  2020 [employer].

## 2019-04-22 ENCOUNTER — Encounter: Payer: Self-pay | Admitting: Orthopedic Surgery

## 2019-04-29 DIAGNOSIS — S83207D Unspecified tear of unspecified meniscus, current injury, left knee, subsequent encounter: Secondary | ICD-10-CM | POA: Diagnosis not present

## 2019-05-02 DIAGNOSIS — M25512 Pain in left shoulder: Secondary | ICD-10-CM | POA: Diagnosis not present

## 2019-05-02 DIAGNOSIS — Z9889 Other specified postprocedural states: Secondary | ICD-10-CM | POA: Insufficient documentation

## 2019-05-03 DIAGNOSIS — S83207D Unspecified tear of unspecified meniscus, current injury, left knee, subsequent encounter: Secondary | ICD-10-CM | POA: Diagnosis not present

## 2019-05-10 DIAGNOSIS — M25512 Pain in left shoulder: Secondary | ICD-10-CM | POA: Diagnosis not present

## 2019-05-10 DIAGNOSIS — Z96659 Presence of unspecified artificial knee joint: Secondary | ICD-10-CM | POA: Diagnosis not present

## 2019-05-24 DIAGNOSIS — Z20828 Contact with and (suspected) exposure to other viral communicable diseases: Secondary | ICD-10-CM | POA: Diagnosis not present

## 2019-07-16 ENCOUNTER — Other Ambulatory Visit: Payer: Self-pay | Admitting: Internal Medicine

## 2019-08-09 ENCOUNTER — Other Ambulatory Visit: Payer: Self-pay | Admitting: Internal Medicine

## 2019-08-09 DIAGNOSIS — E039 Hypothyroidism, unspecified: Secondary | ICD-10-CM

## 2019-08-09 DIAGNOSIS — I25118 Atherosclerotic heart disease of native coronary artery with other forms of angina pectoris: Secondary | ICD-10-CM

## 2019-08-09 DIAGNOSIS — D696 Thrombocytopenia, unspecified: Secondary | ICD-10-CM

## 2019-08-09 DIAGNOSIS — Z9884 Bariatric surgery status: Secondary | ICD-10-CM

## 2019-08-23 ENCOUNTER — Other Ambulatory Visit: Payer: Self-pay

## 2019-08-23 ENCOUNTER — Ambulatory Visit
Admission: RE | Admit: 2019-08-23 | Discharge: 2019-08-23 | Disposition: A | Discharge status: Home / Self Care | Payer: Medicare HMO | Source: Ambulatory Visit | Attending: Internal Medicine | Admitting: Internal Medicine

## 2019-08-23 DIAGNOSIS — G319 Degenerative disease of nervous system, unspecified: Secondary | ICD-10-CM | POA: Insufficient documentation

## 2019-08-23 DIAGNOSIS — I25118 Atherosclerotic heart disease of native coronary artery with other forms of angina pectoris: Secondary | ICD-10-CM | POA: Diagnosis not present

## 2019-08-23 DIAGNOSIS — E039 Hypothyroidism, unspecified: Secondary | ICD-10-CM | POA: Insufficient documentation

## 2019-08-23 DIAGNOSIS — D696 Thrombocytopenia, unspecified: Secondary | ICD-10-CM | POA: Diagnosis present

## 2019-08-23 DIAGNOSIS — Z9884 Bariatric surgery status: Secondary | ICD-10-CM

## 2019-10-12 ENCOUNTER — Other Ambulatory Visit: Payer: Self-pay

## 2019-10-12 ENCOUNTER — Ambulatory Visit: Payer: Medicare HMO | Admitting: Physician Assistant

## 2019-10-12 ENCOUNTER — Encounter: Payer: Self-pay | Admitting: Physician Assistant

## 2019-10-12 VITALS — BP 97/68 | HR 71 | Ht 72.0 in | Wt 238.0 lb

## 2019-10-12 DIAGNOSIS — N410 Acute prostatitis: Secondary | ICD-10-CM

## 2019-10-12 LAB — MICROSCOPIC EXAMINATION: RBC, Urine: NONE SEEN /hpf (ref 0–2)

## 2019-10-12 LAB — URINALYSIS, COMPLETE
Bilirubin, UA: NEGATIVE
Glucose, UA: NEGATIVE
Ketones, UA: NEGATIVE
Leukocytes,UA: NEGATIVE
Nitrite, UA: NEGATIVE
RBC, UA: NEGATIVE
Specific Gravity, UA: >1.03 — ABNORMAL HIGH (ref 1.005–1.030)
Urobilinogen, Ur: 0.2 mg/dL (ref 0.2–1.0)
pH, UA: 5.5 (ref 5.0–7.5)

## 2019-10-12 LAB — BLADDER SCAN AMB NON-IMAGING: Scan Result: 0

## 2019-10-12 MED ORDER — TAMSULOSIN HCL 0.4 MG PO CAPS: 0.8000 mg | ORAL_CAPSULE | Freq: Every day | ORAL | 0 refills | Status: AC

## 2019-10-12 MED ORDER — OXYBUTYNIN CHLORIDE 5 MG PO TABS: 5.0000 mg | ORAL_TABLET | Freq: Three times a day (TID) | ORAL | 0 refills | Status: DC | PRN

## 2019-10-12 NOTE — Progress Notes (Signed)
Dylan Ho Kitchen   10/12/2019 10:36 AM   Dylan Ho 10-21-1950 [id number]  CC: Dysuria, urgency, frequency, lower abdominal pain  HPI: Dylan Ho is a 69 y.o. male with PMH OAB, circumcision revision with Dr. Pilar Jarvis in 2018, gastric bypass with anastomotic ulcer x2 who presents today with concerns for acute prostatitis.  He originally contacted his PCP via telephone on 09/21/2019 reporting a 2-week history of frequency and suprapubic pressure.  He was prescribed Bactrim DS twice daily x10 days for management of presumed UTI and counseled to schedule a clinic visit if his symptoms did not improve.  He followed up with his PCP on 10/03/2019 with reports of urinary frequency, suprapubic pain, and right testicular pain.  UA at that visit pan-negative, urine culture with no significant growth. He was prescribed Cipro 500 mg twice daily x14 days and Flomax 0.4 mg daily x1 month for management of prostatitis with plans for urology referral if his symptoms do not improve.  Today, he reports continued frequency, urgency, dysuria, suprapubic pressure, and radiating pain into the right testicle x1 month.  He believes his symptoms are worsening.  He completed both courses of antibiotics in their entirety with no symptom improvement.  He denies scrotal trauma, penile discharge, fever, chills, nausea, vomiting, and gross hematuria.  No known STI exposures.  No history of prostatitis.  In-office UA today positive for 1+ protein; urine microscopy pan-negative. PVR 61mL.  PMH: Past Medical History:  Diagnosis Date  . Anemia   . Arthritis    knees, shoulders  . CAD (coronary artery disease)    No Stents Present- per patient  . 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 Colon polyps   . Essential hypertension 08/24/2008  . GERD (gastroesophageal reflux disease)    patient denies  . History of alcohol abuse Last Drink- 2011  . History of gout   . Hypertension    no longer on meds  . Hypothyroidism   . Intestinal ulcer     . Iron deficiency anemia due to chronic blood loss 02/27/2017  . Other pancytopenia ([place]) 02/25/2017  . Overactive bladder   . Sleep apnea    improved since gastric bypass  . Thyroid disease     Surgical History: Past Surgical History:  Procedure Laterality Date  . BILATERAL TOTAL SHOULDER ARTHROPLASTY  1990's  . CIRCUMCISION REVISION N/A 03/13/2017   Procedure: CIRCUMCISION REVISION;  Surgeon: Nickie Retort, MD;  Location: [facility];  Service: Urology;  Laterality: N/A;  . COLONOSCOPY WITH PROPOFOL N/A 10/30/2016   Procedure: COLONOSCOPY WITH PROPOFOL;  Surgeon: Lucilla Lame, MD;  Location: [facility];  Service: Endoscopy;  Laterality: N/A;  . ESOPHAGOGASTRODUODENOSCOPY (EGD) WITH PROPOFOL N/A 10/30/2016   Procedure: ESOPHAGOGASTRODUODENOSCOPY (EGD) WITH PROPOFOL;  Surgeon: Lucilla Lame, MD;  Location: [facility];  Service: Endoscopy;  Laterality: N/A;  . ESOPHAGOGASTRODUODENOSCOPY (EGD) WITH PROPOFOL N/A 03/26/2017   Procedure: ESOPHAGOGASTRODUODENOSCOPY (EGD) WITH PROPOFOL;  Surgeon: Lucilla Lame, MD;  Location: [place];  Service: Gastroenterology;  Laterality: N/A;  requests early  . GASTRIC BYPASS  2016   Dr. Darnell Level- [facility], [place] ARTHROSCOPY  1990s  . KNEE ARTHROSCOPY W/ MENISCAL REPAIR Bilateral   . KNEE ARTHROSCOPY WITH MEDIAL MENISECTOMY Left 04/21/2019   Procedure: KNEE ARTHROSCOPY WITH MEDIAL MENISECTOMY;  Surgeon: Lovell Sheehan, MD;  Location: [place];  Service: Orthopedics;  Laterality: Left;  . TONSILLECTOMY  2004    Home Medications:  Allergies as of 10/12/2019   No Known Allergies  Medication List       Accurate as of October 12, 2019 10:36 AM. If you have any questions, ask your nurse or doctor.        ciprofloxacin 500 MG tablet Commonly known as: CIPRO Take by mouth.   HYDROcodone-acetaminophen 5-325 MG tablet Commonly known as: NORCO/VICODIN Take 1 tablet by mouth every 4 (four) hours as needed for moderate pain.    lactulose 10 GM/15ML solution Commonly known as: CHRONULAC TAKE 15MLS TWICE A DAY   levothyroxine 150 MCG tablet Commonly known as: SYNTHROID Take 1 tablet (150 mcg total) by mouth daily before breakfast.   multivitamin capsule Take 1 capsule by mouth daily.   oxybutynin 5 MG tablet Commonly known as: DITROPAN Take 1 tablet (5 mg total) by mouth every 8 (eight) hours as needed for bladder spasms. Started by: Debroah Loop, PA-C   pantoprazole 40 MG tablet Commonly known as: PROTONIX Take 1 tablet (40 mg total) by mouth 2 (two) times daily.   tamsulosin 0.4 MG Caps capsule Commonly known as: FLOMAX Take 2 capsules (0.8 mg total) by mouth daily. What changed:   how much to take  when to take this Changed by: Debroah Loop, PA-C   topiramate 25 MG tablet Commonly known as: TOPAMAX Take by mouth.   traZODone 150 MG tablet Commonly known as: DESYREL Take 4 tablets (600 mg total) by mouth at bedtime.   vitamin B-12 500 MCG tablet Commonly known as: CYANOCOBALAMIN Take 1,000 mcg by mouth daily.       Allergies:  No Known Allergies  Family History: Family History  Problem Relation Age of Onset  . Stroke Mother   . Breast cancer Mother   . Heart disease Father   . Stroke Father   . AAA (abdominal aortic aneurysm) Father   . Lung cancer Paternal Grandmother   . Prostate cancer Neg Hx   . Bladder Cancer Neg Hx   . Kidney cancer Neg Hx     Social History:   reports that he has never smoked. His smokeless tobacco use includes chew. He reports that he does not drink alcohol or use drugs.  Physical Exam: BP 97/68   Pulse 71   Ht 6' (1.829 m)   Wt 238 lb (108 kg)   BMI 32.28 kg/m   Constitutional:  Alert and oriented, no acute distress, nontoxic appearing HEENT: Dylan Ho, AT Cardiovascular: No clubbing, cyanosis, or edema Respiratory: Normal respiratory effort, no increased work of breathing GU: Circumcised penis.  Bilateral descended testicles,  nontender epididymides and intact vasa bilaterally.  Enlarged approximate 40+ cc prostate that is smooth and tender without nodules. Skin: No rashes, bruises or suspicious lesions Neurologic: Grossly intact, no focal deficits, moving all 4 extremities Psychiatric: Normal mood and affect  Laboratory Data: Results for orders placed or performed in visit on 10/12/19  Microscopic Examination   URINE  Result Value Ref Range   WBC, UA 0-5 0 - 5 /hpf   RBC None seen 0 - 2 /hpf   Epithelial Cells (non renal) 0-10 0 - 10 /hpf   Mucus, UA Present (A) Not Estab.   Bacteria, UA Few None seen/Few  Urinalysis, Complete  Result Value Ref Range   Specific Gravity, UA >1.030 (H) 1.005 - 1.030   pH, UA 5.5 5.0 - 7.5   Color, UA Yellow Yellow   Appearance Ur Clear Clear   Leukocytes,UA Negative Negative   Protein,UA 1+ (A) Negative/Trace   Glucose, UA Negative Negative   Ketones, UA  Negative Negative   RBC, UA Negative Negative   Bilirubin, UA Negative Negative   Urobilinogen, Ur 0.2 0.2 - 1.0 mg/dL   Nitrite, UA Negative Negative   Microscopic Examination See below:   Bladder Scan (Post Void Residual) in office  Result Value Ref Range   Scan Result 0    Assessment & Plan:   1. Acute prostatitis UA reassuring for infection today.  Prostate tender on DRE.  PVR WNL.  Suspect inflammatory etiology.  Not a good candidate for NSAIDs given history of bariatric surgery with anastomotic ulcer.  Recommend increasing Flomax to 0.8 mg daily, oxybutynin 5 mg every 8 hours, and Tylenol for symptom palliation.  We will plan for symptom recheck in 4 weeks.  If symptoms persist at that time, recommend TRUS for evaluation of possible anatomic causes of persistent symptoms. - Urinalysis, Complete - Bladder Scan (Post Void Residual) in office - tamsulosin (FLOMAX) 0.4 MG CAPS capsule; Take 2 capsules (0.8 mg total) by mouth daily.  Dispense: 60 capsule; Refill: 0 - oxybutynin (DITROPAN) 5 MG tablet; Take 1 tablet  (5 mg total) by mouth every 8 (eight) hours as needed for bladder spasms.  Dispense: 90 tablet; Refill: 0   Return in about 4 weeks (around 11/09/2019) for Prostatitis symptom recheck.  Debroah Loop, PA-C  [facility] [address], [place], [place] [address] [phone]

## 2019-10-12 NOTE — Patient Instructions (Addendum)
1. Increase your dose of Flomax to 0.4mg  TWICE daily (0.8 mg daily total) for the next month. I've sent a new prescription to your pharmacy today for this. 2. Take Tylenol (acetaminophen) 1000mg  every 6-8 hours as needed for pain. 3. Start oxybutynin 5mg  every 8 hours as needed for urinary urgency and frequency. I've send a new prescription to your pharmacy today for this.   Prostatitis  Prostatitis is swelling or inflammation of the prostate gland. The prostate is a walnut-sized gland that is involved in the production of semen. It is located below a man's bladder, in front of the rectum. There are four types of prostatitis:  Chronic nonbacterial prostatitis. This is the most common type of prostatitis. It may be associated with a viral infection or autoimmune disorder.  Acute bacterial prostatitis. This is the least common type of prostatitis. It starts quickly and is usually associated with a bladder infection, high fever, and shaking chills. It can occur at any age.  Chronic bacterial prostatitis. This type usually results from acute bacterial prostatitis that happens repeatedly (is recurrent) or has not been treated properly. It can occur in men of any age but is most common among middle-aged men whose prostate has begun to get larger. The symptoms are not as severe as symptoms caused by acute bacterial prostatitis.  Prostatodynia or chronic pelvic pain syndrome (CPPS). This type is also called pelvic floor disorder. It is associated with increased muscular tone in the pelvis surrounding the prostate. What are the causes? Bacterial prostatitis is caused by infection from bacteria. Chronic nonbacterial prostatitis may be caused by:  Urinary tract infections (UTIs).  Nerve damage.  A response by the body's disease-fighting system (autoimmune response).  Chemicals in the urine. The causes of the other types of prostatitis are usually not known. What are the signs or symptoms? Symptoms  of this condition vary depending upon the type of prostatitis. If you have acute bacterial prostatitis, you may experience:  Urinary symptoms, such as: ? Painful urination. ? Burning during urination. ? Frequent and sudden urges to urinate. ? Inability to start urinating. ? A weak or interrupted stream of urine.  Vomiting.  Nausea.  Fever.  Chills.  Inability to empty the bladder completely.  Pain in the: ? Muscles or joints. ? Lower back. ? Lower abdomen. If you have any of the other types of prostatitis, you may experience:  Urinary symptoms, such as: ? Sudden urges to urinate. ? Frequent urination. ? Difficulty starting urination. ? Weak urine stream. ? Dribbling after urination.  Discharge from the urethra. The urethra is a tube that opens at the end of the penis.  Pain in the: ? Testicles. ? Penis or tip of the penis. ? Rectum. ? Area in front of the rectum and below the scrotum (perineum).  Problems with sexual function.  Painful ejaculation.  Bloody semen. How is this diagnosed? This condition may be diagnosed based on:  A physical and medical exam.  Your symptoms.  A urine test to check for bacteria.  An exam in which a health care provider uses a finger to feel the prostate (digital rectal exam).  A test of a sample of semen.  Blood tests.  Ultrasound.  Removal of prostate tissue to be examined under a microscope (biopsy).  Tests to check how your body handles urine (urodynamic tests).  A test to look inside your bladder or urethra (cystoscopy). How is this treated? Treatment for this condition depends on the type of prostatitis. Treatment  may involve:  Medicines to relieve pain or inflammation.  Medicines to help relax your muscles.  Physical therapy.  Heat therapy.  Techniques to help you control certain body functions (biofeedback).  Relaxation exercises.  Antibiotic medicine, if your condition is caused by  bacteria.  Warm water baths (sitz baths). Sitz baths help with relaxing your pelvic floor muscles, which helps to relieve pressure on the prostate. Follow these instructions at home:   Take over-the-counter and prescription medicines only as told by your health care provider.  If you were prescribed an antibiotic, take it as told by your health care provider. Do not stop taking the antibiotic even if you start to feel better.  If physical therapy, biofeedback, or relaxation exercises were prescribed, do exercises as instructed.  Take sitz baths as directed by your health care provider. For a sitz bath, sit in warm water that is deep enough to cover your hips and buttocks.  Keep all follow-up visits as told by your health care provider. This is important. Contact a health care provider if:  Your symptoms get worse.  You have a fever. Get help right away if:  You have chills.  You feel nauseous.  You vomit.  You feel light-headed or feel like you are going to faint.  You are unable to urinate.  You have blood or blood clots in your urine. This information is not intended to replace advice given to you by your health care provider. Make sure you discuss any questions you have with your health care provider. Document Revised: 09/05/2017 Document Reviewed: 03/13/2016 Elsevier Patient Education  [place].

## 2019-10-23 NOTE — Progress Notes (Signed)
10/24/19  No chief complaint on file.   HPI: 69 year old male with refractory urinary symptoms related to BPH who presents today to the office for cystoscopy and prostate sizing.  -constant suprapubic pressure, flank pain and irritative urinary symptoms for over a month  -Flomax 0.8 mg daily since couple weeks and oxybutynin 5 mg  -recent culture negative    Please see previous notes for details.     There were no vitals taken for this visit. NED. A&Ox3.   No respiratory distress   Abd soft, NT, ND Normal phallus with bilateral descended testicles   Prostate transrectal ultrasound sizing   Informed consent was obtained after discussing risks/benefits of the procedure.  A time out was performed to ensure correct patient identity.   Pre-Procedure: -Transrectal probe was placed without difficulty -Transrectal Ultrasound performed revealing a 49 gm prostate -No significant hypoechoic or median lobe noted    Assessment/ Plan:  1. BPH w/ LUTS Recent urine culture negative  No concerns noted from ultrasound, 49 gm prostate   Stone protocol CT scheduled and will call with results    I, Lucas Mallow, am acting as a scribe for Dr. Nicki Reaper C. Adrian Dinovo,  I have reviewed the above documentation for accuracy and completeness, and I agree with the above.    Abbie Sons, MD

## 2019-10-24 ENCOUNTER — Other Ambulatory Visit: Payer: Self-pay

## 2019-10-24 ENCOUNTER — Ambulatory Visit (INDEPENDENT_AMBULATORY_CARE_PROVIDER_SITE_OTHER): Payer: Medicare HMO | Admitting: Urology

## 2019-10-24 ENCOUNTER — Encounter: Payer: Self-pay | Admitting: Urology

## 2019-10-24 VITALS — BP 123/83 | HR 60 | Ht 72.0 in | Wt 230.0 lb

## 2019-10-24 DIAGNOSIS — N23 Unspecified renal colic: Secondary | ICD-10-CM | POA: Diagnosis not present

## 2019-10-25 ENCOUNTER — Ambulatory Visit: Payer: Medicare HMO | Admitting: Urology

## 2019-10-28 ENCOUNTER — Other Ambulatory Visit: Payer: Self-pay

## 2019-10-28 ENCOUNTER — Ambulatory Visit
Admission: RE | Admit: 2019-10-28 | Discharge: 2019-10-28 | Disposition: A | Discharge status: Home / Self Care | Payer: Medicare HMO | Source: Ambulatory Visit | Attending: Urology | Admitting: Urology

## 2019-10-28 DIAGNOSIS — N400 Enlarged prostate without lower urinary tract symptoms: Secondary | ICD-10-CM | POA: Insufficient documentation

## 2019-10-28 DIAGNOSIS — I700 Atherosclerosis of aorta: Secondary | ICD-10-CM | POA: Insufficient documentation

## 2019-10-28 DIAGNOSIS — N23 Unspecified renal colic: Secondary | ICD-10-CM | POA: Insufficient documentation

## 2019-10-28 DIAGNOSIS — K8020 Calculus of gallbladder without cholecystitis without obstruction: Secondary | ICD-10-CM | POA: Diagnosis not present

## 2019-10-28 DIAGNOSIS — R590 Localized enlarged lymph nodes: Secondary | ICD-10-CM | POA: Insufficient documentation

## 2019-10-28 DIAGNOSIS — Z9884 Bariatric surgery status: Secondary | ICD-10-CM | POA: Diagnosis not present

## 2019-10-31 ENCOUNTER — Telehealth: Payer: Self-pay | Admitting: Urology

## 2019-10-31 ENCOUNTER — Other Ambulatory Visit: Payer: Self-pay

## 2019-10-31 ENCOUNTER — Other Ambulatory Visit: Payer: Medicare HMO

## 2019-10-31 DIAGNOSIS — R590 Localized enlarged lymph nodes: Secondary | ICD-10-CM

## 2019-10-31 NOTE — Telephone Encounter (Signed)
CT findings were discussed in detail with Dylan Ho.  We will have him come in for a PSA today or tomorrow and schedule prostate biopsy this week if PSA is elevated.  If PSA is normal will refer to oncology.

## 2019-10-31 NOTE — Telephone Encounter (Signed)
Pt is calling he saw his CT scan results on Mychart and is concerned about them please call pt

## 2019-11-01 ENCOUNTER — Telehealth: Payer: Self-pay | Admitting: Physician Assistant

## 2019-11-01 LAB — PSA: Prostate Specific Ag, Serum: 18.6 ng/mL — ABNORMAL HIGH (ref 0.0–4.0)

## 2019-11-01 NOTE — Telephone Encounter (Signed)
Please contact the patient and inform him that his PSA has come back elevated.  Please schedule him for prostate biopsy with Dr. Bernardo Heater this week.

## 2019-11-01 NOTE — Telephone Encounter (Signed)
Made patient appt w Dr Bernardo Heater for Thursday @ 11 per Dr Bernardo Heater. Went over the biopsy instructions with patient. Patient verbalized understanding.

## 2019-11-01 NOTE — Telephone Encounter (Signed)
Patient called to day and would like to know his results for PSA.

## 2019-11-01 NOTE — Telephone Encounter (Signed)
See if he can come in on Thursday 4/29 at 11 AM for TRUS/prostate biopsy

## 2019-11-03 ENCOUNTER — Other Ambulatory Visit: Payer: Self-pay

## 2019-11-03 ENCOUNTER — Encounter: Payer: Self-pay | Admitting: Urology

## 2019-11-03 ENCOUNTER — Ambulatory Visit (INDEPENDENT_AMBULATORY_CARE_PROVIDER_SITE_OTHER): Payer: Medicare HMO | Admitting: Urology

## 2019-11-03 ENCOUNTER — Other Ambulatory Visit: Payer: Self-pay | Admitting: Urology

## 2019-11-03 VITALS — BP 96/67 | HR 81 | Ht 71.0 in | Wt 230.0 lb

## 2019-11-03 DIAGNOSIS — R972 Elevated prostate specific antigen [PSA]: Secondary | ICD-10-CM | POA: Diagnosis not present

## 2019-11-03 DIAGNOSIS — R590 Localized enlarged lymph nodes: Secondary | ICD-10-CM

## 2019-11-03 MED ORDER — GENTAMICIN SULFATE 40 MG/ML IJ SOLN
80.0000 mg | Freq: Once | INTRAMUSCULAR | Status: AC
  Administered 2019-11-03: 80 mg via INTRAMUSCULAR

## 2019-11-03 MED ORDER — LEVOFLOXACIN 500 MG PO TABS
500.0000 mg | ORAL_TABLET | Freq: Once | ORAL | Status: AC
  Administered 2019-11-03: 500 mg via ORAL

## 2019-11-03 NOTE — Progress Notes (Signed)
Prostate Biopsy Procedure   Informed consent was obtained after discussing risks/benefits of the procedure.  A time out was performed to ensure correct patient identity.  Indications: 69 y.o. male recently seen by our PA for pelvic pain and storage related voiding symptoms.  He has a history of stone disease and a stone protocol CT was performed which incidentally showed pelvic adenopathy and sclerotic lesions of the pelvis and ribs suspicious for metastatic prostate cancer.  No previous PSA and one drawn earlier this week was elevated at 18.  Prostate biopsy was recommended.   Pre-Procedure: - Last PSA Level: 18 - Gentamicin given prophylactically - Levaquin 500 mg administered PO -Transrectal Ultrasound performed revealing a 37 gm prostate -No significant hypoechoic right peripheral zone.  -DRE firm right prostate  Procedure: - Prostate block performed using 10 cc 1% lidocaine and biopsies taken from sextant areas, a total of 12 under ultrasound guidance.  Post-Procedure: - Patient tolerated the procedure well - He was counseled to seek immediate medical attention if experiences any severe pain, significant bleeding, or fevers - Return in one week to discuss biopsy results -Bone scan ordered   John Giovanni, MD

## 2019-11-07 ENCOUNTER — Telehealth: Payer: Self-pay | Admitting: Urology

## 2019-11-07 ENCOUNTER — Other Ambulatory Visit: Payer: Self-pay | Admitting: *Deleted

## 2019-11-07 DIAGNOSIS — R590 Localized enlarged lymph nodes: Secondary | ICD-10-CM

## 2019-11-07 NOTE — Telephone Encounter (Signed)
Bone density order in

## 2019-11-07 NOTE — Telephone Encounter (Signed)
Pt is calling he states he is supposed to get a Bone density scan and has not heard anything he would like to have it today or tomorrow he also wants to know if his Biopsy results have come back yet.

## 2019-11-10 ENCOUNTER — Other Ambulatory Visit: Payer: Medicare HMO | Admitting: Urology

## 2019-11-11 ENCOUNTER — Telehealth: Payer: Self-pay | Admitting: Urology

## 2019-11-11 DIAGNOSIS — C61 Malignant neoplasm of prostate: Secondary | ICD-10-CM

## 2019-11-11 LAB — ANATOMIC PATHOLOGY REPORT

## 2019-11-11 NOTE — Telephone Encounter (Signed)
Pt is returning a call for Dr. Bernardo Heater for results

## 2019-11-11 NOTE — Telephone Encounter (Signed)
Patient returned call

## 2019-11-11 NOTE — Telephone Encounter (Addendum)
Call patient to discuss prostate biopsy pathology results and got VM.  Message left to call office back.  Patient was contacted later in the afternoon.  His pathology report was discussed in detail.  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  He was informed CT findings most likely represent lymph node metastasis.  A bone density was ordered but will be changed to a bone scan.  Oncology referral was entered.

## 2019-11-14 ENCOUNTER — Ambulatory Visit: Payer: Medicare HMO | Admitting: Physician Assistant

## 2019-11-14 ENCOUNTER — Telehealth: Payer: Self-pay | Admitting: Urology

## 2019-11-14 NOTE — Telephone Encounter (Signed)
Does he need to keep his results app since you have given him his results already? And his bone scan has been moved up to the 12th   Michelle

## 2019-11-15 ENCOUNTER — Encounter: Payer: Self-pay | Admitting: Oncology

## 2019-11-15 ENCOUNTER — Inpatient Hospital Stay: Payer: Medicare HMO | Attending: Oncology | Admitting: Oncology

## 2019-11-15 ENCOUNTER — Other Ambulatory Visit: Payer: Self-pay

## 2019-11-15 ENCOUNTER — Inpatient Hospital Stay: Payer: Medicare HMO

## 2019-11-15 VITALS — BP 134/91 | HR 70 | Temp 95.0°F | Resp 16 | Wt 244.7 lb

## 2019-11-15 DIAGNOSIS — Z923 Personal history of irradiation: Secondary | ICD-10-CM | POA: Diagnosis not present

## 2019-11-15 DIAGNOSIS — Z801 Family history of malignant neoplasm of trachea, bronchus and lung: Secondary | ICD-10-CM | POA: Insufficient documentation

## 2019-11-15 DIAGNOSIS — C7951 Secondary malignant neoplasm of bone: Secondary | ICD-10-CM | POA: Diagnosis not present

## 2019-11-15 DIAGNOSIS — K219 Gastro-esophageal reflux disease without esophagitis: Secondary | ICD-10-CM | POA: Insufficient documentation

## 2019-11-15 DIAGNOSIS — Z7189 Other specified counseling: Secondary | ICD-10-CM

## 2019-11-15 DIAGNOSIS — R5383 Other fatigue: Secondary | ICD-10-CM | POA: Diagnosis not present

## 2019-11-15 DIAGNOSIS — D508 Other iron deficiency anemias: Secondary | ICD-10-CM | POA: Diagnosis not present

## 2019-11-15 DIAGNOSIS — C61 Malignant neoplasm of prostate: Secondary | ICD-10-CM | POA: Diagnosis not present

## 2019-11-15 DIAGNOSIS — K7030 Alcoholic cirrhosis of liver without ascites: Secondary | ICD-10-CM | POA: Insufficient documentation

## 2019-11-15 DIAGNOSIS — E039 Hypothyroidism, unspecified: Secondary | ICD-10-CM | POA: Diagnosis not present

## 2019-11-15 DIAGNOSIS — I10 Essential (primary) hypertension: Secondary | ICD-10-CM | POA: Insufficient documentation

## 2019-11-15 DIAGNOSIS — G4730 Sleep apnea, unspecified: Secondary | ICD-10-CM | POA: Diagnosis not present

## 2019-11-15 DIAGNOSIS — Z803 Family history of malignant neoplasm of breast: Secondary | ICD-10-CM | POA: Diagnosis not present

## 2019-11-15 DIAGNOSIS — R5381 Other malaise: Secondary | ICD-10-CM | POA: Diagnosis not present

## 2019-11-15 DIAGNOSIS — Z9884 Bariatric surgery status: Secondary | ICD-10-CM | POA: Diagnosis not present

## 2019-11-15 DIAGNOSIS — M1990 Unspecified osteoarthritis, unspecified site: Secondary | ICD-10-CM | POA: Insufficient documentation

## 2019-11-15 DIAGNOSIS — I2510 Atherosclerotic heart disease of native coronary artery without angina pectoris: Secondary | ICD-10-CM | POA: Diagnosis not present

## 2019-11-15 DIAGNOSIS — D696 Thrombocytopenia, unspecified: Secondary | ICD-10-CM | POA: Insufficient documentation

## 2019-11-15 DIAGNOSIS — Z79899 Other long term (current) drug therapy: Secondary | ICD-10-CM | POA: Diagnosis not present

## 2019-11-15 LAB — CBC WITH DIFFERENTIAL/PLATELET
Abs Immature Granulocytes: 0.02 10*3/uL (ref 0.00–0.07)
Basophils Absolute: 0 10*3/uL (ref 0.0–0.1)
Basophils Relative: 1 %
Eosinophils Absolute: 0.1 10*3/uL (ref 0.0–0.5)
Eosinophils Relative: 1 %
HCT: 41.9 % (ref 39.0–52.0)
Hemoglobin: 14.4 g/dL (ref 13.0–17.0)
Immature Granulocytes: 1 %
Lymphocytes Relative: 19 %
Lymphs Abs: 0.8 10*3/uL (ref 0.7–4.0)
MCH: 30.2 pg (ref 26.0–34.0)
MCHC: 34.4 g/dL (ref 30.0–36.0)
MCV: 87.8 fL (ref 80.0–100.0)
Monocytes Absolute: 0.2 10*3/uL (ref 0.1–1.0)
Monocytes Relative: 5 %
Neutro Abs: 3.2 10*3/uL (ref 1.7–7.7)
Neutrophils Relative %: 73 %
Platelets: 103 10*3/uL — ABNORMAL LOW (ref 150–400)
RBC: 4.77 MIL/uL (ref 4.22–5.81)
RDW: 13.2 % (ref 11.5–15.5)
WBC: 4.3 10*3/uL (ref 4.0–10.5)
nRBC: 0 % (ref 0.0–0.2)

## 2019-11-15 LAB — COMPREHENSIVE METABOLIC PANEL
ALT: 11 U/L (ref 0–44)
AST: 20 U/L (ref 15–41)
Albumin: 4.2 g/dL (ref 3.5–5.0)
Alkaline Phosphatase: 145 U/L — ABNORMAL HIGH (ref 38–126)
Anion gap: 8 (ref 5–15)
BUN: 14 mg/dL (ref 8–23)
CO2: 27 mmol/L (ref 22–32)
Calcium: 8.7 mg/dL — ABNORMAL LOW (ref 8.9–10.3)
Chloride: 105 mmol/L (ref 98–111)
Creatinine, Ser: 1.39 mg/dL — ABNORMAL HIGH (ref 0.61–1.24)
GFR calc Af Amer: >60 mL/min (ref >60)
GFR calc non Af Amer: 52 mL/min — ABNORMAL LOW (ref >60)
Glucose, Bld: 93 mg/dL (ref 70–99)
Potassium: 4 mmol/L (ref 3.5–5.1)
Sodium: 140 mmol/L (ref 135–145)
Total Bilirubin: 1 mg/dL (ref 0.3–1.2)
Total Protein: 6.6 g/dL (ref 6.5–8.1)

## 2019-11-15 NOTE — Telephone Encounter (Signed)
Notified patient as instructed, patient pleased. Discussed follow-up appointments, patient agrees  

## 2019-11-15 NOTE — Telephone Encounter (Signed)
He does not need to keep the appointment.  He has an appointment with oncology today and will contact him with the bone scan results.

## 2019-11-15 NOTE — Progress Notes (Signed)
Patient here for initial oncology appointment, expresses concerns of new cancer diagnosis per patient.

## 2019-11-16 ENCOUNTER — Ambulatory Visit
Admission: RE | Admit: 2019-11-16 | Discharge: 2019-11-16 | Disposition: A | Discharge status: Home / Self Care | Payer: Medicare HMO | Source: Ambulatory Visit | Attending: Urology | Admitting: Urology

## 2019-11-16 ENCOUNTER — Encounter: Payer: Self-pay | Admitting: Oncology

## 2019-11-16 ENCOUNTER — Other Ambulatory Visit: Payer: Self-pay | Admitting: *Deleted

## 2019-11-16 ENCOUNTER — Other Ambulatory Visit: Payer: Medicare HMO

## 2019-11-16 ENCOUNTER — Other Ambulatory Visit: Payer: Self-pay

## 2019-11-16 DIAGNOSIS — Z7189 Other specified counseling: Secondary | ICD-10-CM | POA: Insufficient documentation

## 2019-11-16 DIAGNOSIS — C61 Malignant neoplasm of prostate: Secondary | ICD-10-CM | POA: Diagnosis present

## 2019-11-16 DIAGNOSIS — C7951 Secondary malignant neoplasm of bone: Secondary | ICD-10-CM | POA: Insufficient documentation

## 2019-11-16 LAB — TESTOSTERONE: Testosterone: 273 ng/dL (ref 264–916)

## 2019-11-16 MED ORDER — TECHNETIUM TC 99M MEDRONATE IV KIT
20.0000 | PACK | Freq: Once | INTRAVENOUS | Status: AC | PRN
  Administered 2019-11-16: 22.707 via INTRAVENOUS

## 2019-11-16 MED ORDER — DEXAMETHASONE 4 MG PO TABS: 8.0000 mg | ORAL_TABLET | Freq: Two times a day (BID) | ORAL | 1 refills | Status: DC

## 2019-11-16 MED ORDER — PROCHLORPERAZINE MALEATE 10 MG PO TABS: 10.0000 mg | ORAL_TABLET | Freq: Four times a day (QID) | ORAL | 1 refills | Status: DC | PRN

## 2019-11-16 MED ORDER — ONDANSETRON HCL 8 MG PO TABS: 8.0000 mg | ORAL_TABLET | Freq: Two times a day (BID) | ORAL | 1 refills | Status: DC | PRN

## 2019-11-16 MED ORDER — LIDOCAINE-PRILOCAINE 2.5-2.5 % EX CREA: TOPICAL_CREAM | CUTANEOUS | 3 refills | Status: DC

## 2019-11-16 NOTE — Progress Notes (Signed)
Hematology/Oncology Consult note [facility] [phone] [phone]  Patient Care Team: Kirk Ruths, MD as PCP - General (Internal Medicine) Nickie Retort, MD as Consulting Physician (Urology) Lucilla Lame, MD as Consulting Physician (Gastroenterology) Earlie Server, MD as Consulting Physician (Oncology) Lovell Sheehan, MD as Consulting Physician (Orthopedic Surgery)   Name of the patient: Dylan Ho  [id number]  05-14-1951    Reason for referral-metastatic prostate cancer   Referring physician-Dr. Bernardo Heater  Date of visit: 11/16/19   History of presenting illness- Patient is a 69 year old male with a past medical history significant for alcohol-related cirrhosis.  He has not had any ascites or labs suggestive of chronic liver disease.  He does not drink alcohol anymore.  Patient was seen by scale for ongoing urinary symptoms of frequency especially at night.  This was followed by a CT renal stone study which showed cyst in the upper pole of the right kidney as well as left kidney.  S/p gastric bypass.  Pelvic node enlargement in the right pelvis 11 mm.  Bulky rounded lymph node along the external iliac chain 17 mm.  Prostate was enlarged measuring 4.3 cm.  Right obturator lymph node 1 cm.  Legnet1 measuring 14 mm.  Sclerosis of L3.  Left hemisacrum with sclerotic lesion.  Sclerosis of the superior pubic rami.  Large area of sclerosis affecting the right hemisacrum 4.7 x 2.4 cm.  Left ninth and 11th rib with sclerotic lesions compatible with bone metastases.  Right hip sclerosis.  PSA was elevated at 18.6.  Patient underwent 12 core prostate biopsy which showed adenocarcinoma Gleason score 9(4+5) grade group 5.   Patient lives with his wife and is independent of his ADLs.  Denies any back pain.  Appetite and weight have remained stable.  ECOG PS- 1  Pain scale- 0   Review of systems- Review of Systems  Constitutional: Positive  for malaise/fatigue. Negative for chills, fever and weight loss.  HENT: Negative for congestion, ear discharge and nosebleeds.   Eyes: Negative for blurred vision.  Respiratory: Negative for cough, hemoptysis, sputum production, shortness of breath and wheezing.   Cardiovascular: Negative for chest pain, palpitations, orthopnea and claudication.  Gastrointestinal: Negative for abdominal pain, blood in stool, constipation, diarrhea, heartburn, melena, nausea and vomiting.  Genitourinary: Negative for dysuria, flank pain, frequency, hematuria and urgency.  Musculoskeletal: Negative for back pain, joint pain and myalgias.  Skin: Negative for rash.  Neurological: Negative for dizziness, tingling, focal weakness, seizures, weakness and headaches.  Endo/Heme/Allergies: Does not bruise/bleed easily.  Psychiatric/Behavioral: Negative for depression and suicidal ideas. The patient does not have insomnia.     No Known Allergies  Patient Active Problem List   Diagnosis Date Noted  . Constipation 07/09/2017  . Impingement syndrome of right shoulder region 05/13/2017  . Iron deficiency anemia due to chronic blood loss 02/27/2017  . B12 deficiency 01/09/2017  . 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 Osteoarthritis of both knees 01/09/2017  . Insomnia 12/12/2016  . Hx of gastric bypass 12/12/2016  . Cirrhosis of liver ([place]) 11/19/2016  . Positive Helicobacter pylori serology 11/19/2016  . Sleep apnea 11/19/2016  . Von Willebrand disease ([place]) 11/19/2016  . Anastomotic ulcer S/P gastric bypass   . Obesity (BMI 30.0-34.9) 01/19/2014  . Nondependent alcohol abuse 01/11/2010  . Hypothyroidism 01/11/2010  . Hyperlipidemia 01/11/2010  . Coronary atherosclerosis of native coronary artery 09/22/2008     Past Medical History:  Diagnosis Date  . Anemia   . Arthritis  knees, shoulders  . CAD (coronary artery disease)    No Stents Present- per patient  . Cirrhosis ([place])    mild  . Colon polyps     . Essential hypertension 08/24/2008  . GERD (gastroesophageal reflux disease)    patient denies  . History of alcohol abuse Last Drink- 2011  . History of gout   . Hypertension    no longer on meds  . Hypothyroidism   . Intestinal ulcer   . Iron deficiency anemia due to chronic blood loss 02/27/2017  . Other pancytopenia ([place]) 02/25/2017  . Overactive bladder   . Sleep apnea    improved since gastric bypass  . Thyroid disease      Past Surgical History:  Procedure Laterality Date  . BILATERAL TOTAL SHOULDER ARTHROPLASTY  1990's  . CIRCUMCISION REVISION N/A 03/13/2017   Procedure: CIRCUMCISION REVISION;  Surgeon: Nickie Retort, MD;  Location: [facility] ORS;  Service: Urology;  Laterality: N/A;  . COLONOSCOPY WITH PROPOFOL N/A 10/30/2016   Procedure: COLONOSCOPY WITH PROPOFOL;  Surgeon: Lucilla Lame, MD;  Location: [facility] ENDOSCOPY;  Service: Endoscopy;  Laterality: N/A;  . ESOPHAGOGASTRODUODENOSCOPY (EGD) WITH PROPOFOL N/A 10/30/2016   Procedure: ESOPHAGOGASTRODUODENOSCOPY (EGD) WITH PROPOFOL;  Surgeon: Lucilla Lame, MD;  Location: [facility] ENDOSCOPY;  Service: Endoscopy;  Laterality: N/A;  . ESOPHAGOGASTRODUODENOSCOPY (EGD) WITH PROPOFOL N/A 03/26/2017   Procedure: ESOPHAGOGASTRODUODENOSCOPY (EGD) WITH PROPOFOL;  Surgeon: Lucilla Lame, MD;  Location: [place];  Service: Gastroenterology;  Laterality: N/A;  requests early  . GASTRIC BYPASS  2016   Dr. Darnell Level- [facility] [place] ARTHROSCOPY  1990s  . KNEE ARTHROSCOPY W/ MENISCAL REPAIR Bilateral   . KNEE ARTHROSCOPY WITH MEDIAL MENISECTOMY Left 04/21/2019   Procedure: KNEE ARTHROSCOPY WITH MEDIAL MENISECTOMY;  Surgeon: Lovell Sheehan, MD;  Location: [place];  Service: Orthopedics;  Laterality: Left;  . TONSILLECTOMY  2004    Social History   Socioeconomic History  . Marital status: Married    Spouse name: Not on file  . Number of children: Not on file  . Years of education: Not on file  . Highest education level:  Not on file  Occupational History  . Not on file  Tobacco Use  . Smoking status: Never Smoker  . Smokeless tobacco: Current User    Types: Chew  Substance and Sexual Activity  . Alcohol use: No    Comment: Heavy ETOH in past- Last Drink 2011 per patient  . Drug use: No  . Sexual activity: Not on file  Other Topics Concern  . Not on file  Social History Narrative  . Not on file   Social Determinants of Health   Financial Resource Strain:   . Difficulty of Paying Living Expenses:   Food Insecurity:   . Worried About Charity fundraiser in the Last Year:   . Arboriculturist in the Last Year:   Transportation Needs:   . Film/video editor (Medical):   [name] Kitchen Lack of Transportation (Non-Medical):   Physical Activity:   . Days of Exercise per Week:   . Minutes of Exercise per Session:   Stress:   . Feeling of Stress :   Social Connections:   . Frequency of Communication with Friends and Family:   . Frequency of Social Gatherings with Friends and Family:   . Attends Religious Services:   . Active Member of Clubs or Organizations:   . Attends Archivist Meetings:   [name] Kitchen Marital Status:  Intimate Partner Violence:   . Fear of Current or Ex-Partner:   . Emotionally Abused:   [name] Kitchen Physically Abused:   . Sexually Abused:      Family History  Problem Relation Age of Onset  . Stroke Mother   . Breast cancer Mother   . Heart disease Father   . Stroke Father   . AAA (abdominal aortic aneurysm) Father   . Lung cancer Paternal Grandmother   . Prostate cancer Neg Hx   . Bladder Cancer Neg Hx   . Kidney cancer Neg Hx      Current Outpatient Medications:  .  lactulose (CHRONULAC) 10 GM/15ML solution, TAKE 15MLS TWICE A DAY, Disp: 950 mL, Rfl: 1 .  tamsulosin (FLOMAX) 0.4 MG CAPS capsule, Take 0.4 mg by mouth daily., Disp: , Rfl:  .  traZODone (DESYREL) 150 MG tablet, Take 4 tablets (600 mg total) by mouth at bedtime., Disp: 360 tablet, Rfl: 1 .   HYDROcodone-acetaminophen (NORCO/VICODIN) 5-325 MG tablet, Take 1 tablet by mouth every 4 (four) hours as needed for moderate pain. (Patient not taking: Reported on 11/15/2019), Disp: 20 tablet, Rfl: 0 .  levothyroxine (SYNTHROID, LEVOTHROID) 150 MCG tablet, Take 1 tablet (150 mcg total) by mouth daily before breakfast. (Patient not taking: Reported on 11/15/2019), Disp: 90 tablet, Rfl: 1 .  Multiple Vitamin (MULTIVITAMIN) capsule, Take 1 capsule by mouth daily., Disp: , Rfl:  .  pantoprazole (PROTONIX) 40 MG tablet, Take 1 tablet (40 mg total) by mouth 2 (two) times daily. (Patient not taking: Reported on 11/15/2019), Disp: 180 tablet, Rfl: 3 .  topiramate (TOPAMAX) 25 MG tablet, Take by mouth., Disp: , Rfl:  .  vitamin B-12 (CYANOCOBALAMIN) 500 MCG tablet, Take 1,000 mcg by mouth daily. , Disp: , Rfl:    Physical exam:  Vitals:   11/15/19 1049  BP: (!) 134/91  Pulse: 70  Resp: 16  Temp: (!) 95 F (35 C)  TempSrc: Tympanic  SpO2: 98%  Weight: 244 lb 11.2 oz (111 kg)   Physical Exam Constitutional:      General: He is not in acute distress. Cardiovascular:     Rate and Rhythm: Normal rate and regular rhythm.     Heart sounds: Normal heart sounds.  Pulmonary:     Effort: Pulmonary effort is normal.     Breath sounds: Normal breath sounds.  Abdominal:     General: Bowel sounds are normal.     Palpations: Abdomen is soft.  Skin:    General: Skin is warm and dry.  Neurological:     Mental Status: He is alert and oriented to person, place, and time.        CMP Latest Ref Rng & Units 11/15/2019  Glucose 70 - 99 mg/dL 93  BUN 8 - 23 mg/dL 14  Creatinine 0.61 - 1.24 mg/dL 1.39(H)  Sodium 135 - 145 mmol/L 140  Potassium 3.5 - 5.1 mmol/L 4.0  Chloride 98 - 111 mmol/L 105  CO2 22 - 32 mmol/L 27  Calcium 8.9 - 10.3 mg/dL 8.7(L)  Total Protein 6.5 - 8.1 g/dL 6.6  Total Bilirubin 0.3 - 1.2 mg/dL 1.0  Alkaline Phos 38 - 126 U/L 145(H)  AST 15 - 41 U/L 20  ALT 0 - 44 U/L 11   CBC  Latest Ref Rng & Units 11/15/2019  WBC 4.0 - 10.5 K/uL 4.3  Hemoglobin 13.0 - 17.0 g/dL 14.4  Hematocrit 39.0 - 52.0 % 41.9  Platelets 150 - 400 K/uL 103(L)  No images are attached to the encounter.  CT RENAL STONE STUDY  Result Date: 10/28/2019 CLINICAL DATA:  Urinary tract stone, renal colic RIGHT groin pain. EXAM: CT ABDOMEN AND PELVIS WITHOUT CONTRAST TECHNIQUE: Multidetector CT imaging of the abdomen and pelvis was performed following the standard protocol without IV contrast. COMPARISON:  10/05/2016 FINDINGS: Lower chest: Lung bases are clear. Hepatobiliary: Liver again displays a cirrhotic morphology. No focal lesion on noncontrast imaging. No pericholecystic stranding. Cholelithiasis. Pancreas: Pancreas mildly atrophic, no ductal dilation or peripancreatic inflammation. Spleen: Spleen upper limits of normal for size not changed from the previous imaging study. Perisplenic collateral pathways in the upper abdomen. These are similar to the prior study tracking down the LEFT flank. Adrenals/Urinary Tract: Adrenal glands are normal. No sign of hydronephrosis. Mild cortical scarring on the RIGHT. Surgical clip adjacent to RIGHT kidney. Small cyst in the upper pole the RIGHT kidney. Mild perinephric stranding bilaterally. No nephrolithiasis. Low-density lesion approximately a cm in the upper pole the LEFT kidney is also likely a cyst. Small intermediate density contour abnormality along the anterior surface of the LEFT kidney lower pole measures 54 Hounsfield units approximately 7 mm (image 39, series 2). Stomach/Bowel: Post gastric bypass procedure. Roux limb anterior to the colon. No Peri enteric stranding or sign of bowel obstruction. Position of the anastomotic site is unchanged compared to previous imaging. The appendix is normal. Stool fills much of the colon with colonic diverticulosis. Vascular/Lymphatic: Calcified atheromatous plaque scattered about the abdominal aorta, extending from the  thoracic aorta. No abdominal aortic aneurysm. No retroperitoneal adenopathy. Pelvic nodal enlargement in the RIGHT pelvis (image 62, series 2) 11 mm. Bulky rounded lymph node along the external iliac chain (image 69, series 2) 17 mm short axis. Reproductive: Prostate heterogeneity, nonspecific finding with periprostatic stranding. Prostate is enlarged compared to prior study. Measuring 4.3 cm in transverse dimension as compared to 3.3 cm. RIGHT operator lymph node (image 74, series 2) 1 cm. Other: No large abdominal wall hernia. Musculoskeletal: Sclerotic bone lesion, new at the L1 level as compared to the prior study measuring approximately 14 mm. Sclerosis at L3, tiny area of sclerosis anteriorly in the L2 vertebral body. LEFT hemi sacrum with sclerotic lesion. Similarly LEFT iliac wing. Sclerosis in both the superior pubic rami. Large area of sclerosis affecting the RIGHT hemi sacrum approximately 4.7 x 2.4 cm. LEFT ninth and eleventh ribs with sclerotic lesions also compatible with bony metastases. RIGHT eighth rib with sclerosis as well along the lateral margin. IMPRESSION: 1. Interval enlargement of the prostate gland with new bulky RIGHT pelvic adenopathy tracking towards RIGHT common iliac chain with smaller nodes anterior to the aorta in the retroperitoneum, associated with multifocal areas of sclerosis most suggestive of prostate cancer with metastases, with ill-defined prostate margins currently and enlargement of the prostate since the prior study. 2. Cholelithiasis without evidence of acute cholecystitis. 3. Post gastric bypass procedure without evidence of bowel obstruction. 4. Aortic atherosclerosis. These results will be called to the ordering clinician or representative by the Radiologist Assistant, and communication documented in the PACS or [employer]. Aortic Atherosclerosis (ICD10-I70.0). Electronically Signed   By: Zetta Bills M.D.   On: 10/28/2019 15:22    Assessment and plan-  Patient is a 69 y.o. male referred for castrate sensitive metastatic prostate cancer  I have reviewed the CT renal stone images independently and discussed findings with the patient.  Patient was found to have enlarged pelvic nodes, enlarged prostate gland as well as areas of bone  metastases involving his ribs and pelvic bones.  Biopsy of the prostate gland was consistent with Gleason's 9 adenocarcinoma.    Discussed with the patient the natural course of prostate cancer and differences between castrate sensitive and resistant disease.  This is all new diagnosis for patient.  He would benefit from monthly Firmagon for testosterone suppression to start with and then we will eventually switch him to Lupron every 3 months.  We will plan to give him the first dose of Firmagon this week.  Discussed risks and benefits of ADT including all but not limited to fatigue, low sex drive, gynecomastia, hot flashes, anemia and worsening bone health  Based on the extent of the disease as well as the fact he has a Gleason's 9 prostate cancer he meets criteria for both docetaxel and zytiga per CHAARTED and LATTITUDE trial respectively.  Both these trials have shown improvement in progression free survival by ~1.5 years as compared to ADT alone. Docetaxel and Zytiga have not been compared head-to-head.  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  However he does not have any other signs and symptoms of cirrhosis and his synthetic functions including albumin are normal.  Bilirubin is normal.  No significant splenomegaly noted on CT scan.  Given his young age, I would prefer starting off with chemotherapy given that it has a time-limited course over Zytiga which would need to be continued until progression or toxicity.  Discussed risks and benefits of docetaxel including all but not limited to nausea, vomiting, low blood counts, risk of infections and hospitalization.  Risk of peripheral neuropathy  associated with docetaxel.  I will plan to start chemotherapy after 1 month when he receives his second dose of Firmagon.  Treatment will be given with a palliative intent.  If it is difficult to give him docetaxel because of his thrombocytopenia I will consider switching him to [facility].  Patient will be port placement and chemotherapy teach prior to starting treatment.  No role for bisphosphonates in castrate sensitive disease on a monthly basis but I will consider getting a bone density scan down the line and see if he would benefit from bisphosphonates every 6 months if he has osteopenia.  Patient is not having significant pain at this time and therefore does not require any palliative radiation.  I will also refer him to genetic counseling.  He had adopted son but if he were to have any germline BRCA mutation he may be a candidate for olaparib.  I will also send NGS testing on his biopsy specimen.  Will obtain CT chest without contrast to complete his staging work-up.  Bone scan has been ordered and is currently pending.   Total face to face encounter time for this patient visit was 45 min.       Thank you for this kind referral and the opportunity to participate in the care of this patient   Visit Diagnosis 1. Prostate cancer metastatic to bone ([place])   2. Goals of care, counseling/discussion     Dr. Randa Evens, MD, MPH [facility] [phone] 11/16/2019 1:45 PM

## 2019-11-17 ENCOUNTER — Telehealth (INDEPENDENT_AMBULATORY_CARE_PROVIDER_SITE_OTHER): Payer: Self-pay

## 2019-11-17 ENCOUNTER — Inpatient Hospital Stay: Payer: Medicare HMO

## 2019-11-17 NOTE — Telephone Encounter (Signed)
I attempted to contact the patient and a message was left for a return call. 

## 2019-11-17 NOTE — Telephone Encounter (Signed)
Spoke with the patient and he is scheduled with Dr. Lucky Cowboy for a port placement on 11/23/19 with a 12:00 pm arrival time to the [facility]. Patient will do covid testing on 11/21/19 between 8-1 pm at the [facility]. Pre-procedure instructions were discussed and will be mailed to the patient.

## 2019-11-18 ENCOUNTER — Ambulatory Visit: Payer: Medicare HMO

## 2019-11-18 ENCOUNTER — Ambulatory Visit: Payer: Medicare HMO | Admitting: Urology

## 2019-11-18 ENCOUNTER — Other Ambulatory Visit: Payer: Self-pay

## 2019-11-18 ENCOUNTER — Inpatient Hospital Stay: Payer: Medicare HMO

## 2019-11-18 DIAGNOSIS — C61 Malignant neoplasm of prostate: Secondary | ICD-10-CM | POA: Diagnosis not present

## 2019-11-18 DIAGNOSIS — N410 Acute prostatitis: Secondary | ICD-10-CM

## 2019-11-18 DIAGNOSIS — D500 Iron deficiency anemia secondary to blood loss (chronic): Secondary | ICD-10-CM

## 2019-11-18 MED ORDER — TAMSULOSIN HCL 0.4 MG PO CAPS: 0.4000 mg | ORAL_CAPSULE | Freq: Every day | ORAL | 3 refills | Status: DC

## 2019-11-18 MED ORDER — OXYBUTYNIN CHLORIDE 5 MG PO TABS: 5.0000 mg | ORAL_TABLET | Freq: Three times a day (TID) | ORAL | 0 refills | Status: AC | PRN

## 2019-11-18 MED ORDER — DEGARELIX ACETATE(240 MG DOSE) 120 MG/VIAL ~~LOC~~ SOLR
240.0000 mg | Freq: Once | SUBCUTANEOUS | Status: AC
  Administered 2019-11-18: 240 mg via SUBCUTANEOUS
  Filled 2019-11-18: qty 6

## 2019-11-21 ENCOUNTER — Other Ambulatory Visit: Payer: Self-pay

## 2019-11-21 ENCOUNTER — Other Ambulatory Visit
Admission: RE | Admit: 2019-11-21 | Discharge: 2019-11-21 | Disposition: A | Discharge status: Home / Self Care | Payer: Medicare HMO | Source: Ambulatory Visit | Attending: Vascular Surgery | Admitting: Vascular Surgery

## 2019-11-21 DIAGNOSIS — Z01812 Encounter for preprocedural laboratory examination: Secondary | ICD-10-CM | POA: Insufficient documentation

## 2019-11-21 DIAGNOSIS — Z20822 Contact with and (suspected) exposure to covid-19: Secondary | ICD-10-CM | POA: Diagnosis not present

## 2019-11-22 ENCOUNTER — Other Ambulatory Visit (INDEPENDENT_AMBULATORY_CARE_PROVIDER_SITE_OTHER): Payer: Self-pay | Admitting: Nurse Practitioner

## 2019-11-22 LAB — SARS CORONAVIRUS 2 (TAT 6-24 HRS): SARS Coronavirus 2: NEGATIVE

## 2019-11-23 ENCOUNTER — Ambulatory Visit
Admission: RE | Admit: 2019-11-23 | Discharge: 2019-11-23 | Disposition: A | Discharge status: Home / Self Care | Payer: Medicare HMO | Attending: Vascular Surgery | Admitting: Vascular Surgery

## 2019-11-23 ENCOUNTER — Encounter: Payer: Self-pay | Admitting: Vascular Surgery

## 2019-11-23 ENCOUNTER — Encounter
Admission: RE | Disposition: A | Discharge status: Home / Self Care | Payer: Self-pay | Source: Home / Self Care | Attending: Vascular Surgery

## 2019-11-23 ENCOUNTER — Ambulatory Visit: Payer: Medicare HMO

## 2019-11-23 ENCOUNTER — Other Ambulatory Visit: Payer: Medicare HMO

## 2019-11-23 ENCOUNTER — Other Ambulatory Visit: Payer: Self-pay

## 2019-11-23 DIAGNOSIS — G4730 Sleep apnea, unspecified: Secondary | ICD-10-CM | POA: Insufficient documentation

## 2019-11-23 DIAGNOSIS — C61 Malignant neoplasm of prostate: Secondary | ICD-10-CM

## 2019-11-23 DIAGNOSIS — Z79890 Hormone replacement therapy (postmenopausal): Secondary | ICD-10-CM | POA: Diagnosis not present

## 2019-11-23 DIAGNOSIS — I10 Essential (primary) hypertension: Secondary | ICD-10-CM | POA: Insufficient documentation

## 2019-11-23 DIAGNOSIS — I2510 Atherosclerotic heart disease of native coronary artery without angina pectoris: Secondary | ICD-10-CM | POA: Insufficient documentation

## 2019-11-23 DIAGNOSIS — E669 Obesity, unspecified: Secondary | ICD-10-CM | POA: Insufficient documentation

## 2019-11-23 DIAGNOSIS — E039 Hypothyroidism, unspecified: Secondary | ICD-10-CM | POA: Diagnosis not present

## 2019-11-23 DIAGNOSIS — Z9884 Bariatric surgery status: Secondary | ICD-10-CM | POA: Diagnosis not present

## 2019-11-23 DIAGNOSIS — K7460 Unspecified cirrhosis of liver: Secondary | ICD-10-CM | POA: Insufficient documentation

## 2019-11-23 DIAGNOSIS — F17290 Nicotine dependence, other tobacco product, uncomplicated: Secondary | ICD-10-CM | POA: Insufficient documentation

## 2019-11-23 DIAGNOSIS — K219 Gastro-esophageal reflux disease without esophagitis: Secondary | ICD-10-CM | POA: Insufficient documentation

## 2019-11-23 DIAGNOSIS — Z79899 Other long term (current) drug therapy: Secondary | ICD-10-CM | POA: Insufficient documentation

## 2019-11-23 DIAGNOSIS — C7951 Secondary malignant neoplasm of bone: Secondary | ICD-10-CM | POA: Insufficient documentation

## 2019-11-23 DIAGNOSIS — D509 Iron deficiency anemia, unspecified: Secondary | ICD-10-CM | POA: Diagnosis not present

## 2019-11-23 DIAGNOSIS — E785 Hyperlipidemia, unspecified: Secondary | ICD-10-CM | POA: Diagnosis not present

## 2019-11-23 DIAGNOSIS — M109 Gout, unspecified: Secondary | ICD-10-CM | POA: Diagnosis not present

## 2019-11-23 DIAGNOSIS — Z6832 Body mass index (BMI) 32.0-32.9, adult: Secondary | ICD-10-CM | POA: Insufficient documentation

## 2019-11-23 HISTORY — PX: PORTA CATH INSERTION: CATH118285

## 2019-11-23 SURGERY — PORTA CATH INSERTION
Anesthesia: Moderate Sedation

## 2019-11-23 MED ORDER — DIPHENHYDRAMINE HCL 50 MG/ML IJ SOLN: 50.0000 mg | Freq: Once | INTRAMUSCULAR | Status: DC | PRN

## 2019-11-23 MED ORDER — FENTANYL CITRATE (PF) 100 MCG/2ML IJ SOLN
INTRAMUSCULAR | Status: DC | PRN
  Administered 2019-11-23: 50 ug via INTRAVENOUS

## 2019-11-23 MED ORDER — MIDAZOLAM HCL 2 MG/2ML IJ SOLN
INTRAMUSCULAR | Status: DC | PRN
  Administered 2019-11-23: 2 mg via INTRAVENOUS

## 2019-11-23 MED ORDER — CEFAZOLIN SODIUM-DEXTROSE 2-4 GM/100ML-% IV SOLN
2.0000 g | Freq: Once | INTRAVENOUS | Status: AC
  Administered 2019-11-23: 2 g via INTRAVENOUS

## 2019-11-23 MED ORDER — SODIUM CHLORIDE 0.9 % IV SOLN: INTRAVENOUS | Status: DC

## 2019-11-23 MED ORDER — LIDOCAINE-EPINEPHRINE (PF) 1 %-1:200000 IJ SOLN
INTRAMUSCULAR | Status: AC
  Filled 2019-11-23: qty 10

## 2019-11-23 MED ORDER — HYDROMORPHONE HCL 1 MG/ML IJ SOLN: 1.0000 mg | Freq: Once | INTRAMUSCULAR | Status: DC | PRN

## 2019-11-23 MED ORDER — FENTANYL CITRATE (PF) 100 MCG/2ML IJ SOLN
INTRAMUSCULAR | Status: AC
  Filled 2019-11-23: qty 2

## 2019-11-23 MED ORDER — ONDANSETRON HCL 4 MG/2ML IJ SOLN: 4.0000 mg | Freq: Four times a day (QID) | INTRAMUSCULAR | Status: DC | PRN

## 2019-11-23 MED ORDER — SODIUM CHLORIDE 0.9 % IV SOLN
Freq: Once | INTRAVENOUS | Status: DC
  Filled 2019-11-23: qty 2

## 2019-11-23 MED ORDER — CEFAZOLIN SODIUM-DEXTROSE 2-4 GM/100ML-% IV SOLN
INTRAVENOUS | Status: AC
  Filled 2019-11-23: qty 100

## 2019-11-23 MED ORDER — MIDAZOLAM HCL 2 MG/ML PO SYRP: 8.0000 mg | ORAL_SOLUTION | Freq: Once | ORAL | Status: DC | PRN

## 2019-11-23 MED ORDER — METHYLPREDNISOLONE SODIUM SUCC 125 MG IJ SOLR: 125.0000 mg | Freq: Once | INTRAMUSCULAR | Status: DC | PRN

## 2019-11-23 MED ORDER — FAMOTIDINE 20 MG PO TABS: 40.0000 mg | ORAL_TABLET | Freq: Once | ORAL | Status: DC | PRN

## 2019-11-23 MED ORDER — MIDAZOLAM HCL 5 MG/5ML IJ SOLN
INTRAMUSCULAR | Status: AC
  Filled 2019-11-23: qty 5

## 2019-11-23 SURGICAL SUPPLY — 11 items
DERMABOND ADVANCED (GAUZE/BANDAGES/DRESSINGS) ×1
DERMABOND ADVANCED .7 DNX12 (GAUZE/BANDAGES/DRESSINGS) IMPLANT
ELECT REM PT RETURN 9FT ADLT (ELECTROSURGICAL) ×2
ELECTRODE REM PT RTRN 9FT ADLT (ELECTROSURGICAL) IMPLANT
KIT PORT POWER 8FR ISP CVUE (Port) ×1 IMPLANT
PACK ANGIOGRAPHY (CUSTOM PROCEDURE TRAY) ×1 IMPLANT
PENCIL ELECTRO HAND CTR (MISCELLANEOUS) ×2 IMPLANT
SUT MNCRL AB 4-0 PS2 18 (SUTURE) ×3 IMPLANT
SUT PROLENE 0 CT 1 30 (SUTURE) ×3 IMPLANT
SUT VIC AB 3-0 SH 27 (SUTURE) ×1
SUT VIC AB 3-0 SH 27X BRD (SUTURE) IMPLANT

## 2019-11-23 NOTE — Discharge Instructions (Signed)
Implanted Port Insertion, Care After °This sheet gives you information about how to care for yourself after your procedure. Your health care provider may also give you more specific instructions. If you have problems or questions, contact your health care provider. °What can I expect after the procedure? °After the procedure, it is common to have: °· Discomfort at the port insertion site. °· Bruising on the skin over the port. This should improve over 3-4 days. °Follow these instructions at home: °Port care °· After your port is placed, you will get a manufacturer's information card. The card has information about your port. Keep this card with you at all times. °· Take care of the port as told by your health care provider. Ask your health care provider if you or a family member can get training for taking care of the port at home. A home health care nurse may also take care of the port. °· Make sure to remember what type of port you have. °Incision care ° °  ° °· Follow instructions from your health care provider about how to take care of your port insertion site. Make sure you: °? Wash your hands with soap and water before and after you change your bandage (dressing). If soap and water are not available, use hand sanitizer. °? Change your dressing as told by your health care provider. °? Leave stitches (sutures), skin glue, or adhesive strips in place. These skin closures may need to stay in place for 2 weeks or longer. If adhesive strip edges start to loosen and curl up, you may trim the loose edges. Do not remove adhesive strips completely unless your health care provider tells you to do that. °· Check your port insertion site every day for signs of infection. Check for: °? Redness, swelling, or pain. °? Fluid or blood. °? Warmth. °? Pus or a bad smell. °Activity °· Return to your normal activities as told by your health care provider. Ask your health care provider what activities are safe for you. °· Do not  lift anything that is heavier than 10 lb (4.5 kg), or the limit that you are told, until your health care provider says that it is safe. °General instructions °· Take over-the-counter and prescription medicines only as told by your health care provider. °· Do not take baths, swim, or use a hot tub until your health care provider approves. Ask your health care provider if you may take showers. You may only be allowed to take sponge baths. °· Do not drive for 24 hours if you were given a sedative during your procedure. °· Wear a medical alert bracelet in case of an emergency. This will tell any health care providers that you have a port. °· Keep all follow-up visits as told by your health care provider. This is important. °Contact a health care provider if: °· You cannot flush your port with saline as directed, or you cannot draw blood from the port. °· You have a fever or chills. °· You have redness, swelling, or pain around your port insertion site. °· You have fluid or blood coming from your port insertion site. °· Your port insertion site feels warm to the touch. °· You have pus or a bad smell coming from the port insertion site. °Get help right away if: °· You have chest pain or shortness of breath. °· You have bleeding from your port that you cannot control. °Summary °· Take care of the port as told by your health   care provider. Keep the manufacturer's information card with you at all times. °· Change your dressing as told by your health care provider. °· Contact a health care provider if you have a fever or chills or if you have redness, swelling, or pain around your port insertion site. °· Keep all follow-up visits as told by your health care provider. °This information is not intended to replace advice given to you by your health care provider. Make sure you discuss any questions you have with your health care provider. °Document Revised: 01/19/2018 Document Reviewed: 01/19/2018 °Elsevier Patient Education ©  2020 [employer]vier Inc[employer] °Moderate Conscious Sedation, Adult, Care After °These instructions provide you with information about caring for yourself after your procedure. Your health care provider may also give you more specific instructions. Your treatment has been planned according to current medical practices, but problems sometimes occur. Call your health care provider if you have any problems or questions after your procedure. °What can I expect after the procedure? °After your procedure, it is common: °· To feel sleepy for several hours. °· To feel clumsy and have poor balance for several hours. °· To have poor judgment for several hours. °· To vomit if you eat too soon. °Follow these instructions at home: °For at least 24 hours after the procedure: ° °· Do not: °? Participate in activities where you could fall or become injured. °? Drive. °? Use heavy machinery. °? Drink alcohol. °? Take sleeping pills or medicines that cause drowsiness. °? Make important decisions or sign legal documents. °? Take care of children on your own. °· Rest. °Eating and drinking °· Follow the diet recommended by your health care provider. °· If you vomit: °? Drink water, juice, or soup when you can drink without vomiting. °? Make sure you have Fancher or no nausea before eating solid foods. °General instructions °· Have a responsible adult stay with you until you are awake and alert. °· Take over-the-counter and prescription medicines only as told by your health care provider. °· If you smoke, do not smoke without supervision. °· Keep all follow-up visits as told by your health care provider. This is important. °Contact a health care provider if: °· You keep feeling nauseous or you keep vomiting. °· You feel light-headed. °· You develop a rash. °· You have a fever. °Get help right away if: °· You have trouble breathing. °This information is not intended to replace advice given to you by your health care provider. Make sure you discuss any  questions you have with your health care provider. °Document Revised: 06/05/2017 Document Reviewed: 10/13/2015 °Elsevier Patient Education © 2020 Elsevier Inc. ° °

## 2019-11-23 NOTE — H&P (Signed)
[facility] History & Physical Update  The patient was interviewed and re-examined.  The patient's previous History and Physical has been reviewed and is unchanged.  There is no change in the plan of care. We plan to proceed with the scheduled procedure.  Leotis Pain, MD  11/23/2019, 9:32 AM

## 2019-11-23 NOTE — Op Note (Signed)
      [facility]       Operative Note  Date: 11/23/2019  Preoperative diagnosis:  1. Prostate cancer  Postoperative diagnosis:  Same as above  Procedures: #1. Ultrasound guidance for vascular access to the right internal jugular vein. #2. Fluoroscopic guidance for placement of catheter. #3. Placement of CT compatible Port-A-Cath, right internal jugular vein.  Surgeon: Leotis Pain, MD.   Anesthesia: Local with moderate conscious sedation for approximately 20 minutes using 2 mg of Versed and 50 mcg of Fentanyl  Fluoroscopy time: less than 1 minute  Contrast used: 0  Estimated blood loss: 5 cc  Indication for the procedure:  The patient is a 69 y.o.male with prostate cancer.  The patient needs a Port-A-Cath for durable venous access, chemotherapy, lab draws, and CT scans. We are asked to place this. Risks and benefits were discussed and informed consent was obtained.  Description of procedure: The patient was brought to the vascular and interventional radiology suite.  Moderate conscious sedation was administered throughout the procedure during a face to face encounter with the patient with my supervision of the RN administering medicines and monitoring the patient's vital signs, pulse oximetry, telemetry and mental status throughout from the start of the procedure until the patient was taken to the recovery room. The right neck chest and shoulder were sterilely prepped and draped, and a sterile surgical field was created. Ultrasound was used to help visualize a patent right internal jugular vein. This was then accessed under direct ultrasound guidance without difficulty with the Seldinger needle and a permanent image was recorded. A J-wire was placed. After skin nick and dilatation, the peel-away sheath was then placed over the wire. I then anesthetized an area under the clavicle approximately 1-2 fingerbreadths. A transverse incision was created and an inferior pocket  was created with electrocautery and blunt dissection. The port was then brought onto the field, placed into the pocket and secured to the chest wall with 2 Prolene sutures. The catheter was connected to the port and tunneled from the subclavicular incision to the access site. Fluoroscopic guidance was then used to cut the catheter to an appropriate length. The catheter was then placed through the peel-away sheath and the peel-away sheath was removed. The catheter tip was parked in excellent location under fluorocoscopic guidance in the SVC just above the right atrium. The pocket was then irrigated with antibiotic impregnated saline and the wound was closed with a running 3-0 Vicryl and a 4-0 Monocryl. The access incision was closed with a single 4-0 Monocryl. The Huber needle was used to withdraw blood and flush the port with heparinized saline. Dermabond was then placed as a dressing. The patient tolerated the procedure well and was taken to the recovery room in stable condition.   Leotis Pain 11/23/2019 10:34 AM   This note was created with Dragon Medical transcription system. Any errors in dictation are purely unintentional.

## 2019-11-25 ENCOUNTER — Other Ambulatory Visit: Payer: Self-pay

## 2019-11-25 ENCOUNTER — Ambulatory Visit
Admission: RE | Admit: 2019-11-25 | Discharge: 2019-11-25 | Disposition: A | Discharge status: Home / Self Care | Payer: Medicare HMO | Source: Ambulatory Visit | Attending: Oncology | Admitting: Oncology

## 2019-11-25 DIAGNOSIS — C61 Malignant neoplasm of prostate: Secondary | ICD-10-CM | POA: Insufficient documentation

## 2019-11-25 DIAGNOSIS — C7951 Secondary malignant neoplasm of bone: Secondary | ICD-10-CM | POA: Insufficient documentation

## 2019-12-09 ENCOUNTER — Other Ambulatory Visit: Payer: Self-pay | Admitting: Oncology

## 2019-12-09 NOTE — Progress Notes (Signed)
Pharmacist Chemotherapy Monitoring - Initial Assessment    Anticipated start date: 12/16/19  Regimen:  . Are orders appropriate based on the patient's diagnosis, regimen, and cycle? Yes . Does the plan date match the patient's scheduled date? Yes . Is the sequencing of drugs appropriate? Yes . Are the premedications appropriate for the patient's regimen? Yes . Prior Authorization for treatment is: Approved o If applicable, is the correct biosimilar selected based on the patient's insurance? not applicable  Organ Function and Labs: [name] Kitchen Are dose adjustments needed based on the patient's renal function, hepatic function, or hematologic function? No . Are appropriate labs ordered prior to the start of patient's treatment? Yes . Other organ system assessment, if indicated: N/A . The following baseline labs, if indicated, have been ordered: N/A  Dose Assessment: . Are the drug doses appropriate? Yes . Are the following correct: o Drug concentrations Yes o IV fluid compatible with drug Yes o Administration routes Yes o Timing of therapy Yes . If applicable, does the patient have documented access for treatment and/or plans for port-a-cath placement? yes . If applicable, have lifetime cumulative doses been properly documented and assessed? no Lifetime Dose Tracking  No doses have been documented on this patient for the following tracked chemicals: Doxorubicin, Epirubicin, Idarubicin, Daunorubicin, Mitoxantrone, Bleomycin, Oxaliplatin, Carboplatin, Liposomal Doxorubicin  o   Toxicity Monitoring/Prevention: . The patient has the following take home antiemetics prescribed: Ondansetron, Prochlorperazine and Dexamethasone . The patient has the following take home medications prescribed: N/A . Medication allergies and previous infusion related reactions, if applicable, have been reviewed and addressed. Yes . The patient's current medication list has been assessed for drug-drug interactions with  their chemotherapy regimen. no significant drug-drug interactions were identified on review.  Order Review: . Are the treatment plan orders signed? Yes . Is the patient scheduled to see a provider prior to their treatment? Yes  I verify that I have reviewed each item in the above checklist and answered each question accordingly.  Dylan Ho 12/09/2019 8:35 AM

## 2019-12-12 ENCOUNTER — Encounter: Payer: Self-pay | Admitting: Oncology

## 2019-12-12 NOTE — Patient Instructions (Signed)
Docetaxel injection What is this medicine? DOCETAXEL (doe se TAX el) is a chemotherapy drug. It targets fast dividing cells, like cancer cells, and causes these cells to die. This medicine is used to treat many types of cancers like breast cancer, certain stomach cancers, head and neck cancer, lung cancer, and prostate cancer. This medicine may be used for other purposes; ask your health care provider or pharmacist if you have questions. COMMON BRAND NAME(S): Docefrez, Taxotere What should I tell my health care provider before I take this medicine? They need to know if you have any of these conditions:  infection (especially a virus infection such as chickenpox, cold sores, or herpes)  liver disease  low blood counts, like low white cell, platelet, or red cell counts  an unusual or allergic reaction to docetaxel, polysorbate 80, other chemotherapy agents, other medicines, foods, dyes, or preservatives  pregnant or trying to get pregnant  breast-feeding How should I use this medicine? This drug is given as an infusion into a vein. It is administered in a hospital or clinic by a specially trained health care professional. Talk to your pediatrician regarding the use of this medicine in children. Special care may be needed. Overdosage: If you think you have taken too much of this medicine contact a poison control center or emergency room at once. NOTE: This medicine is only for you. Do not share this medicine with others. What if I miss a dose? It is important not to miss your dose. Call your doctor or health care professional if you are unable to keep an appointment. What may interact with this medicine?  aprepitant  certain antibiotics like erythromycin or clarithromycin  certain antivirals for HIV or hepatitis  certain medicines for fungal infections like fluconazole, itraconazole, ketoconazole, posaconazole, or  voriconazole  cimetidine  ciprofloxacin  conivaptan  cyclosporine  dronedarone  fluvoxamine  grapefruit juice  imatinib  verapamil This list may not describe all possible interactions. Give your health care provider a list of all the medicines, herbs, non-prescription drugs, or dietary supplements you use. Also tell them if you smoke, drink alcohol, or use illegal drugs. Some items may interact with your medicine. What should I watch for while using this medicine? Your condition will be monitored carefully while you are receiving this medicine. You will need important blood work done while you are taking this medicine. Call your doctor or health care professional for advice if you get a fever, chills or sore throat, or other symptoms of a cold or flu. Do not treat yourself. This drug decreases your body's ability to fight infections. Try to avoid being around people who are sick. Some products may contain alcohol. Ask your health care professional if this medicine contains alcohol. Be sure to tell all health care professionals you are taking this medicine. Certain medicines, like metronidazole and disulfiram, can cause an unpleasant reaction when taken with alcohol. The reaction includes flushing, headache, nausea, vomiting, sweating, and increased thirst. The reaction can last from 30 minutes to several hours. You may get drowsy or dizzy. Do not drive, use machinery, or do anything that needs mental alertness until you know how this medicine affects you. Do not stand or sit up quickly, especially if you are an older patient. This reduces the risk of dizzy or fainting spells. Alcohol may interfere with the effect of this medicine. Talk to your health care professional about your risk of cancer. You may be more at risk for certain types of cancer if   you take this medicine. Do not become pregnant while taking this medicine or for 6 months after stopping it. Women should inform their doctor if  they wish to become pregnant or think they might be pregnant. There is a potential for serious side effects to an unborn child. Talk to your health care professional or pharmacist for more information. Do not breast-feed an infant while taking this medicine or for 1 week after stopping it. Males who get this medicine must use a condom during sex with females who can get pregnant. If you get a woman pregnant, the baby could have birth defects. The baby could die before they are born. You will need to continue wearing a condom for 3 months after stopping the medicine. Tell your health care provider right away if your partner becomes pregnant while you are taking this medicine. This may interfere with the ability to father a child. You should talk to your doctor or health care professional if you are concerned about your fertility. What side effects may I notice from receiving this medicine? Side effects that you should report to your doctor or health care professional as soon as possible:  allergic reactions like skin rash, itching or hives, swelling of the face, lips, or tongue  blurred vision  breathing problems  changes in vision  low blood counts - This drug may decrease the number of white blood cells, red blood cells and platelets. You may be at increased risk for infections and bleeding.  nausea and vomiting  pain, redness or irritation at site where injected  pain, tingling, numbness in the hands or feet  redness, blistering, peeling, or loosening of the skin, including inside the mouth  signs of decreased platelets or bleeding - bruising, pinpoint red spots on the skin, black, tarry stools, nosebleeds  signs of decreased red blood cells - unusually weak or tired, fainting spells, lightheadedness  signs of infection - fever or chills, cough, sore throat, pain or difficulty passing urine  swelling of the ankle, feet, hands Side effects that usually do not require medical attention  (report to your doctor or health care professional if they continue or are bothersome):  constipation  diarrhea  fingernail or toenail changes  hair loss  loss of appetite  mouth sores  muscle pain This list may not describe all possible side effects. Call your doctor for medical advice about side effects. You may report side effects to FDA at 1-800-FDA-1088. Where should I keep my medicine? This drug is given in a hospital or clinic and will not be stored at home. NOTE: This sheet is a summary. It may not cover all possible information. If you have questions about this medicine, talk to your doctor, pharmacist, or health care provider.  2020 [employer] Standard (2019-02-17 10:19:06)  

## 2019-12-13 ENCOUNTER — Inpatient Hospital Stay (HOSPITAL_BASED_OUTPATIENT_CLINIC_OR_DEPARTMENT_OTHER): Payer: Medicare HMO | Admitting: Oncology

## 2019-12-13 ENCOUNTER — Ambulatory Visit: Payer: Medicare HMO | Admitting: Oncology

## 2019-12-13 ENCOUNTER — Inpatient Hospital Stay: Payer: Medicare HMO | Attending: Oncology

## 2019-12-13 ENCOUNTER — Other Ambulatory Visit: Payer: Self-pay

## 2019-12-13 DIAGNOSIS — Z9884 Bariatric surgery status: Secondary | ICD-10-CM | POA: Insufficient documentation

## 2019-12-13 DIAGNOSIS — M1990 Unspecified osteoarthritis, unspecified site: Secondary | ICD-10-CM | POA: Insufficient documentation

## 2019-12-13 DIAGNOSIS — I10 Essential (primary) hypertension: Secondary | ICD-10-CM | POA: Diagnosis not present

## 2019-12-13 DIAGNOSIS — C61 Malignant neoplasm of prostate: Secondary | ICD-10-CM

## 2019-12-13 DIAGNOSIS — Z5189 Encounter for other specified aftercare: Secondary | ICD-10-CM | POA: Insufficient documentation

## 2019-12-13 DIAGNOSIS — E039 Hypothyroidism, unspecified: Secondary | ICD-10-CM | POA: Insufficient documentation

## 2019-12-13 DIAGNOSIS — I2510 Atherosclerotic heart disease of native coronary artery without angina pectoris: Secondary | ICD-10-CM | POA: Insufficient documentation

## 2019-12-13 DIAGNOSIS — R5381 Other malaise: Secondary | ICD-10-CM | POA: Diagnosis not present

## 2019-12-13 DIAGNOSIS — R5383 Other fatigue: Secondary | ICD-10-CM | POA: Diagnosis not present

## 2019-12-13 DIAGNOSIS — Z7689 Persons encountering health services in other specified circumstances: Secondary | ICD-10-CM | POA: Diagnosis not present

## 2019-12-13 DIAGNOSIS — K219 Gastro-esophageal reflux disease without esophagitis: Secondary | ICD-10-CM | POA: Insufficient documentation

## 2019-12-13 DIAGNOSIS — Z9221 Personal history of antineoplastic chemotherapy: Secondary | ICD-10-CM | POA: Insufficient documentation

## 2019-12-13 DIAGNOSIS — G4730 Sleep apnea, unspecified: Secondary | ICD-10-CM | POA: Insufficient documentation

## 2019-12-13 DIAGNOSIS — Z5111 Encounter for antineoplastic chemotherapy: Secondary | ICD-10-CM | POA: Insufficient documentation

## 2019-12-13 DIAGNOSIS — D696 Thrombocytopenia, unspecified: Secondary | ICD-10-CM | POA: Diagnosis not present

## 2019-12-13 DIAGNOSIS — C7951 Secondary malignant neoplasm of bone: Secondary | ICD-10-CM

## 2019-12-13 DIAGNOSIS — Z79899 Other long term (current) drug therapy: Secondary | ICD-10-CM | POA: Diagnosis not present

## 2019-12-13 DIAGNOSIS — Z7952 Long term (current) use of systemic steroids: Secondary | ICD-10-CM | POA: Diagnosis not present

## 2019-12-13 DIAGNOSIS — K7030 Alcoholic cirrhosis of liver without ascites: Secondary | ICD-10-CM | POA: Insufficient documentation

## 2019-12-13 NOTE — Progress Notes (Signed)
[place]  Telephone:([phone] [phone]  Patient Care Team: Kirk Ruths, MD as PCP - General (Internal Medicine) Nickie Retort, MD as Consulting Physician (Urology) Lucilla Lame, MD as Consulting Physician (Gastroenterology) Lovell Sheehan, MD as Consulting Physician (Orthopedic Surgery) Sindy Guadeloupe, MD as Consulting Physician (Oncology)   Name of the patient: Dylan Ho  [id number]  1950-08-28   Date of visit: 12/13/19  Diagnosis-prostate cancer  Chief complaint/Reason for visit- Initial Meeting for [facility], preparing for starting chemotherapy  Heme/Onc history:  Oncology History  Prostate cancer metastatic to bone ([place])  11/16/2019 Initial Diagnosis   Prostate cancer metastatic to bone ([place])   12/16/2019 -  Chemotherapy   The patient had pegfilgrastim (NEULASTA ONPRO KIT) injection 6 mg, 6 mg, Subcutaneous, Once, 0 of 6 cycles DOCEtaxel (TAXOTERE) 180 mg in sodium chloride 0.9 % 250 mL chemo infusion, 75 mg/m2, Intravenous,  Once, 0 of 6 cycles  for chemotherapy treatment.      Interval history-[name] is a 69 year old male who presents to chemo care clinic today for initial meeting in preparation for starting chemotherapy. I introduced the chemo care clinic and we discussed that the role of the clinic is to assist those who are at an increased risk of emergency room visits and/or complications during the course of chemotherapy treatment. We discussed that the increased risk takes into account factors such as age, performance status, and co-morbidities. We also discussed that for some, this might include barriers to care such as not having a primary care provider, lack of insurance/transportation, or not being able to afford medications. We discussed that the goal of the program is to help prevent unplanned ER visits and help reduce complications during chemotherapy. We do this  by discussing specific risk factors to each individual and identifying ways that we can help improve these risk factors and reduce barriers to care.   ECOG FS:0 - Asymptomatic  Review of systems- Review of Systems  Constitutional: Positive for malaise/fatigue. Negative for chills, fever and weight loss.  HENT: Negative for congestion, ear pain and tinnitus.   Eyes: Negative.  Negative for blurred vision and double vision.  Respiratory: Negative.  Negative for cough, sputum production and shortness of breath.   Cardiovascular: Negative.  Negative for chest pain, palpitations and leg swelling.  Gastrointestinal: Negative.  Negative for abdominal pain, constipation, diarrhea, nausea and vomiting.  Genitourinary: Positive for frequency. Negative for dysuria and urgency.  Musculoskeletal: Negative for back pain and falls.  Skin: Negative.  Negative for rash.  Neurological: Negative.  Negative for weakness and headaches.  Endo/Heme/Allergies: Negative.  Does not bruise/bleed easily.  Psychiatric/Behavioral: Negative.  Negative for depression. The patient is not nervous/anxious and does not have insomnia.      Current treatment- Mills Koller and will start Taxotere Friday.   No Known Allergies  Past Medical History:  Diagnosis Date   Anemia    Arthritis    knees, shoulders   CAD (coronary artery disease)    No Stents Present- per patient   Cirrhosis ([place])    mild   Colon polyps    Essential hypertension 08/24/2008   GERD (gastroesophageal reflux disease)    patient denies   History of alcohol abuse Last Drink- 2011   History of gout    Hypertension    no longer on meds   Hypothyroidism    Intestinal ulcer    Iron deficiency anemia due to chronic  blood loss 02/27/2017   Other pancytopenia ([place]) 02/25/2017   Overactive bladder    Sleep apnea    improved since gastric bypass   Thyroid disease     Past Surgical History:  Procedure Laterality Date   BILATERAL TOTAL  SHOULDER ARTHROPLASTY  1990's   CIRCUMCISION REVISION N/A 03/13/2017   Procedure: CIRCUMCISION REVISION;  Surgeon: Nickie Retort, MD;  Location: [facility];  Service: Urology;  Laterality: N/A;   COLONOSCOPY WITH PROPOFOL N/A 10/30/2016   Procedure: COLONOSCOPY WITH PROPOFOL;  Surgeon: Lucilla Lame, MD;  Location: [facility];  Service: Endoscopy;  Laterality: N/A;   ESOPHAGOGASTRODUODENOSCOPY (EGD) WITH PROPOFOL N/A 10/30/2016   Procedure: ESOPHAGOGASTRODUODENOSCOPY (EGD) WITH PROPOFOL;  Surgeon: Lucilla Lame, MD;  Location: [facility];  Service: Endoscopy;  Laterality: N/A;   ESOPHAGOGASTRODUODENOSCOPY (EGD) WITH PROPOFOL N/A 03/26/2017   Procedure: ESOPHAGOGASTRODUODENOSCOPY (EGD) WITH PROPOFOL;  Surgeon: Lucilla Lame, MD;  Location: [place];  Service: Gastroenterology;  Laterality: N/A;  requests early   GASTRIC BYPASS  2016   Dr. Darnell Level- [facility] [place]   KNEE ARTHROSCOPY  1990s   KNEE ARTHROSCOPY W/ MENISCAL REPAIR Bilateral    KNEE ARTHROSCOPY WITH MEDIAL MENISECTOMY Left 04/21/2019   Procedure: KNEE ARTHROSCOPY WITH MEDIAL MENISECTOMY;  Surgeon: Lovell Sheehan, MD;  Location: [place];  Service: Orthopedics;  Laterality: Left;   PORTA CATH INSERTION N/A 11/23/2019   Procedure: PORTA CATH INSERTION;  Surgeon: Algernon Huxley, MD;  Location: [place] [facility];  Service: Cardiovascular;  Laterality: N/A;   TONSILLECTOMY  2004    Social History   Socioeconomic History   Marital status: Married    Spouse name: Not on file   Number of children: Not on file   Years of education: Not on file   Highest education level: Not on file  Occupational History   Not on file  Tobacco Use   Smoking status: Never Smoker   Smokeless tobacco: Current User    Types: Chew  Substance and Sexual Activity   Alcohol use: No    Comment: Heavy ETOH in past- Last Drink 2011 per patient   Drug use: No   Sexual activity: Not on file  Other Topics Concern   Not  on file  Social History Narrative   Not on file   Social Determinants of Health   Financial Resource Strain:    Difficulty of Paying Living Expenses:   Food Insecurity:    Worried About Charity fundraiser in the Last Year:    Arboriculturist in the Last Year:   Transportation Needs:    Film/video editor (Medical):    Lack of Transportation (Non-Medical):   Physical Activity:    Days of Exercise per Week:    Minutes of Exercise per Session:   Stress:    Feeling of Stress :   Social Connections:    Frequency of Communication with Friends and Family:    Frequency of Social Gatherings with Friends and Family:    Attends Religious Services:    Active Member of Clubs or Organizations:    Attends Music therapist:    Marital Status:   Intimate Partner Violence:    Fear of Current or Ex-Partner:    Emotionally Abused:    Physically Abused:    Sexually Abused:     Family History  Problem Relation Age of Onset   Stroke Mother    Breast cancer Mother    Heart disease Father  Stroke Father    AAA (abdominal aortic aneurysm) Father    Lung cancer Paternal Grandmother    Prostate cancer Neg Hx    Bladder Cancer Neg Hx    Kidney cancer Neg Hx      Current Outpatient Medications:    dexamethasone (DECADRON) 4 MG tablet, Take 2 tablets (8 mg total) by mouth 2 (two) times daily. Start the day before Taxotere. Then daily after chemo for 2 days., Disp: 30 tablet, Rfl: 1   lactulose (CHRONULAC) 10 GM/15ML solution, TAKE 15MLS TWICE A DAY, Disp: 950 mL, Rfl: 1   levothyroxine (SYNTHROID, LEVOTHROID) 150 MCG tablet, Take 1 tablet (150 mcg total) by mouth daily before breakfast., Disp: 90 tablet, Rfl: 1   lidocaine-prilocaine (EMLA) cream, Apply to affected area once, Disp: 30 g, Rfl: 3   Multiple Vitamin (MULTIVITAMIN) capsule, Take 1 capsule by mouth daily., Disp: , Rfl:    ondansetron (ZOFRAN) 8 MG tablet, Take 1 tablet (8 mg  total) by mouth 2 (two) times daily as needed for refractory nausea / vomiting., Disp: 30 tablet, Rfl: 1   oxybutynin (DITROPAN) 5 MG tablet, Take 1 tablet (5 mg total) by mouth every 8 (eight) hours as needed for bladder spasms., Disp: 90 tablet, Rfl: 0   pantoprazole (PROTONIX) 40 MG tablet, Take 1 tablet (40 mg total) by mouth 2 (two) times daily. (Patient not taking: Reported on 11/15/2019), Disp: 180 tablet, Rfl: 3   prochlorperazine (COMPAZINE) 10 MG tablet, Take 1 tablet (10 mg total) by mouth every 6 (six) hours as needed (Nausea or vomiting)., Disp: 30 tablet, Rfl: 1   tamsulosin (FLOMAX) 0.4 MG CAPS capsule, Take 1 capsule (0.4 mg total) by mouth daily., Disp: 30 capsule, Rfl: 3   topiramate (TOPAMAX) 25 MG tablet, Take by mouth., Disp: , Rfl:    traZODone (DESYREL) 150 MG tablet, Take 4 tablets (600 mg total) by mouth at bedtime., Disp: 360 tablet, Rfl: 1   vitamin B-12 (CYANOCOBALAMIN) 500 MCG tablet, Take 1,000 mcg by mouth daily. , Disp: , Rfl:   Physical exam: There were no vitals filed for this visit. Physical Exam Constitutional:      Appearance: Normal appearance.  HENT:     Head: Normocephalic and atraumatic.  Eyes:     Pupils: Pupils are equal, round, and reactive to light.  Cardiovascular:     Rate and Rhythm: Normal rate and regular rhythm.     Heart sounds: Normal heart sounds. No murmur.  Pulmonary:     Effort: Pulmonary effort is normal.     Breath sounds: Normal breath sounds. No wheezing.  Abdominal:     General: Bowel sounds are normal. There is no distension.     Palpations: Abdomen is soft.     Tenderness: There is no abdominal tenderness.  Musculoskeletal:        General: Normal range of motion.     Cervical back: Normal range of motion.  Skin:    General: Skin is warm and dry.     Findings: No rash.  Neurological:     Mental Status: He is alert and oriented to person, place, and time.  Psychiatric:        Judgment: Judgment normal.       CMP Latest Ref Rng & Units 11/15/2019  Glucose 70 - 99 mg/dL 93  BUN 8 - 23 mg/dL 14  Creatinine 0.61 - 1.24 mg/dL 1.39(H)  Sodium 135 - 145 mmol/L 140  Potassium 3.5 - 5.1 mmol/L 4.0  Chloride 98 - 111 mmol/L 105  CO2 22 - 32 mmol/L 27  Calcium 8.9 - 10.3 mg/dL 8.7(L)  Total Protein 6.5 - 8.1 g/dL 6.6  Total Bilirubin 0.3 - 1.2 mg/dL 1.0  Alkaline Phos 38 - 126 U/L 145(H)  AST 15 - 41 U/L 20  ALT 0 - 44 U/L 11   CBC Latest Ref Rng & Units 11/15/2019  WBC 4.0 - 10.5 K/uL 4.3  Hemoglobin 13.0 - 17.0 g/dL 14.4  Hematocrit 39.0 - 52.0 % 41.9  Platelets 150 - 400 K/uL 103(L)    No images are attached to the encounter.  CT Chest Wo Contrast  Result Date: 11/25/2019 CLINICAL DATA:  Prostate cancer with bone metastasis. Pre chemotherapy. Nonsmoker. Elevated creatinine. EXAM: CT CHEST WITHOUT CONTRAST TECHNIQUE: Multidetector CT imaging of the chest was performed following the standard protocol without IV contrast. COMPARISON:  11/16/2019 bone scan. Chest radiograph 03/15/2016. FINDINGS: Cardiovascular: Right Port-A-Cath tip at low SVC. Aortic and branch vessel atherosclerosis. Tortuous thoracic aorta. Borderline cardiomegaly, without pericardial effusion. Multivessel coronary artery atherosclerosis. Mediastinum/Nodes: No supraclavicular adenopathy. No mediastinal or definite hilar adenopathy, given limitations of unenhanced CT. Lungs/Pleura: No pleural fluid. Accessory right upper lobe bronchus including on 56/3. Clear lungs. Upper Abdomen: Cirrhosis. Enlarged periumbilical vein. 4 mm gallstone. Normal imaged portions of the spleen, adrenal glands, kidneys. Status post gastric bypass. Musculoskeletal: Moderate bilateral gynecomastia. Multifocal sclerotic osseous metastasis. An index right eighth posterior right rib lesion measures 3.0 cm on 87/2. Posterior T7 vertebral body lesion measures 1.0 cm on sagittal image 92. The T6 vertebral body is diffusely sclerotic. IMPRESSION: 1. Multifocal  sclerotic osseous metastasis. 2. No evidence of soft tissue metastasis within the chest. 3. Cirrhosis. 4. Cholelithiasis. 5. Bilateral gynecomastia. 6. Coronary artery atherosclerosis. Aortic Atherosclerosis (ICD10-I70.0). Electronically Signed   By: Abigail Miyamoto M.D.   On: 11/25/2019 13:48   NM Bone Scan Whole Body  Result Date: 11/17/2019 CLINICAL DATA:  Prostate carcinoma EXAM: NUCLEAR MEDICINE WHOLE BODY BONE SCAN TECHNIQUE: Whole body anterior and posterior images were obtained approximately 3 hours after intravenous injection of radiopharmaceutical. RADIOPHARMACEUTICALS:  22.707 mCi Technetium-54mMDP IV COMPARISON:  CT abdomen and pelvis October 28, 2019 FINDINGS: There are foci of increased radiotracer uptake in the left upper cervical region, in the midthoracic region at T6 and T7, at L1, in the sacrum at S1 as well as in each sacral ala, more extensive on the right than on the left with involvement of much of the right sacral ala; as well as scattered foci of increased uptake in the left lateral iliac crest and superior left acetabular region. There is abnormal uptake in the region of the right glenoid labrum and inferior right scapula, also felt to represent metastatic disease. Increased uptake is noted in the mid right tibia. There are foci of abnormal radiotracer uptake in the lateral left eleventh rib as well as in the lateral left ninth rib, lateral right ninth rib, and medial right eighth rib. Areas of increased uptake in knee and ankle regions is likely of arthropathic etiology. There is diminished renal activity. IMPRESSION: Bony metastatic foci at multiple sites in the spine, ribs, right scapula, pelvis, and right tibia. Diminished renal activity bilaterally is a finding of uncertain significance. Electronically Signed   By: WLowella GripIII M.D.   On: 11/17/2019 08:10   PERIPHERAL VASCULAR CATHETERIZATION  Result Date: 11/23/2019 See op note   Assessment and plan- [facility] is a 69 y.[facility]. [facility] for starting chemotherapy for the treatment of metastatic prostate cancer.   1. HPI: Mr. Mobley is a 69 year old male with a past medical history significant for alcohol-related cirrhosis, gastric bypass and dysuria and nocturia.  Work-up included CT renal stone study which showed cyst in upper pole of right kidney as well as left and an enlarged prostate.  Further imaging revealed several lymph nodes and bone involvement including L3, left hemisacrum, superior pubic rami, right hemisacrum, ninth and 11th rib sclerotic lesion and right hip sclerosis.  PSA elevated at 18.6.  Prostate biopsy revealed adenocarcinoma Gleason score 9 grade group 5.  He is followed by Dr. Janese Banks and plan is to begin monthly testerone suppression with Mills Koller X 2 with switch to Lupron every 3 months.  Chemotherapy will begin with initiation of second dose of Firmagon.  May need bisphosphonates for osteopenia but no role in castrate sensitive disease. Bone Scan ordered.  He does not require palliative radiation at this time.  NGS testing pending.  2. Chemo Care Clinic/High Risk for ER/Hospitalization during chemotherapy- We discussed the role of the chemo care clinic and identified patient specific risk factors. I discussed that patient was identified as high risk primarily based on: stage of disease and comorbidities.  Patient has past medical history positive for: Past Medical History:  Diagnosis Date   Anemia    Arthritis    knees, shoulders   CAD (coronary artery disease)    No Stents Present- per patient   Cirrhosis ([place])    mild   Colon polyps    Essential hypertension 08/24/2008   GERD (gastroesophageal reflux disease)    patient denies   History of alcohol abuse Last Drink- 2011   History of gout    Hypertension    no longer on meds   Hypothyroidism    Intestinal ulcer    Iron deficiency anemia due to chronic blood loss  02/27/2017   Other pancytopenia ([place]) 02/25/2017   Overactive bladder    Sleep apnea    improved since gastric bypass   Thyroid disease     Patient has past surgical history positive for: Past Surgical History:  Procedure Laterality Date   BILATERAL TOTAL SHOULDER ARTHROPLASTY  1990's   CIRCUMCISION REVISION N/A 03/13/2017   Procedure: CIRCUMCISION REVISION;  Surgeon: Nickie Retort, MD;  Location: [facility];  Service: Urology;  Laterality: N/A;   COLONOSCOPY WITH PROPOFOL N/A 10/30/2016   Procedure: COLONOSCOPY WITH PROPOFOL;  Surgeon: Lucilla Lame, MD;  Location: [facility];  Service: Endoscopy;  Laterality: N/A;   ESOPHAGOGASTRODUODENOSCOPY (EGD) WITH PROPOFOL N/A 10/30/2016   Procedure: ESOPHAGOGASTRODUODENOSCOPY (EGD) WITH PROPOFOL;  Surgeon: Lucilla Lame, MD;  Location: [facility];  Service: Endoscopy;  Laterality: N/A;   ESOPHAGOGASTRODUODENOSCOPY (EGD) WITH PROPOFOL N/A 03/26/2017   Procedure: ESOPHAGOGASTRODUODENOSCOPY (EGD) WITH PROPOFOL;  Surgeon: Lucilla Lame, MD;  Location: [place];  Service: Gastroenterology;  Laterality: N/A;  requests early   GASTRIC BYPASS  2016   Dr. Darnell Level- [facility] [place]   KNEE ARTHROSCOPY  1990s   KNEE ARTHROSCOPY W/ MENISCAL REPAIR Bilateral    KNEE ARTHROSCOPY WITH MEDIAL MENISECTOMY Left 04/21/2019   Procedure: KNEE ARTHROSCOPY WITH MEDIAL MENISECTOMY;  Surgeon: Lovell Sheehan, MD;  Location: [place];  Service: Orthopedics;  Laterality: Left;   PORTA CATH INSERTION N/A 11/23/2019   Procedure: PORTA CATH INSERTION;  Surgeon: Algernon Huxley, MD;  Location: [place] [facility];  Service: Cardiovascular;  Laterality: N/A;   TONSILLECTOMY  2004  Based on our high risk symptom management report; this patient has a high risk of ED utilization.  The percentage below indicates how "at risk "  this patient based on the factors in this table within one year.   General Risk Score: 4  Values used to calculate this  score:   Points  Metrics      1        Age: 18      2        Hospital Admissions: 2      0        ED Visits: 0      0        Has Chronic Obstructive Pulmonary Disease: No      0        Has Diabetes: No      0        Has Congestive Heart Failure: No      1        Has liver disease: Yes      0        Has Depression: No      0        Current PCP: Kirk Ruths, MD      0        Has Medicaid: No   3. We discussed that social determinants of health may have significant impacts on health and outcomes for cancer patients.  Today we discussed specific social determinants of performance status, alcohol use, depression, financial needs, food insecurity, housing, interpersonal violence, social connections, stress, tobacco use, and transportation.    After lengthy discussion the following were identified as areas of need:   Power of Attorney/Living will- He needs to get this completed.Will touch base with Moishe Spice.   He also needs help with some of his medical bills. Will reach out to [facility].  Outpatient services: We discussed options including home based and outpatient services, DME and care program. We discusssed that patients who participate in regular physical activity report fewer negative impacts of cancer and treatments and report less fatigue.   Financial Concerns: We discussed that living with cancer can create tremendous financial burden.  We discussed options for assistance. I asked that if assistance is needed in affording medications or paying bills to please let us know so that we can provide assistance. We discussed options for food including social services, [employer] ($50 every 2 weeks) and onsite food pantry.  We will also notify [facility] to see if cancer center can provide additional support.  Referral to Social work: Introduced Education officer, museum Elease Etienne and the services he can provide such as support with [employer], cell phone and gas vouchers.    Support groups: We discussed options for support groups at the cancer center. If interested, please notify nurse navigator to enroll. We discussed options for managing stress including healthy eating, exercise as well as participating in no charge counseling services at the cancer center and support groups.  If these are of interest, patient can notify either myself or primary nursing team.We discussed options for management including medications and referral to quit Smart program  Transportation: We discussed options for transportation including acta, paratransit, bus routes, link transit, taxi/uber/lyft, and cancer center [place].  I have notified primary oncology team who will help assist with arranging Lucianne Lei transportation for appointments when/if needed. We also discussed options for transportation on short notice/acute visits.  Palliative care services: We have palliative care  services available in the cancer center to discuss goals of care and advanced care planning.  Please let us know if you have any questions or would like to speak to our palliative nurse practitioner.  Symptom Management Clinic: We discussed our symptom management clinic which is available for acute concerns while receiving treatment such as nausea, vomiting or diarrhea.  We can be reached via telephone at [phone] or through my chart.  We are available for virtual or in person visits on the same day from 830 to 4 PM Monday through Friday. He denies needing specific assistance at this time and He will be followed by Dr. Janese Banks clinical team.  Plan: Discussed symptom management clinic. Discussed palliative care services. Discussed resources that are available here at the cancer center. Discussed medications and new prescriptions to begin treatment such as anti-nausea or steroids.   Disposition: RTC on 12/16/2019 for lab work, MD assessment and initiation of Taxotere chemotherapy along with second dose of Firmagon.  Visit  Diagnosis 1. Prostate cancer metastatic to bone [facility])     Patient expressed understanding and was in agreement with this plan. He also understands that He can call clinic at any time with any questions, concerns, or complaints.   Greater than 50% was spent in counseling and coordination of care with this patient including but not limited to discussion of the relevant topics above (See A&P) including, but not limited to diagnosis and management of acute and chronic medical conditions.   [facility] [place]  CC: Dr. Janese Banks

## 2019-12-15 ENCOUNTER — Other Ambulatory Visit: Payer: Medicare HMO

## 2019-12-15 ENCOUNTER — Encounter: Payer: Self-pay | Admitting: Oncology

## 2019-12-15 ENCOUNTER — Ambulatory Visit: Payer: Medicare HMO | Admitting: Oncology

## 2019-12-15 ENCOUNTER — Ambulatory Visit: Payer: Medicare HMO

## 2019-12-15 NOTE — Progress Notes (Signed)
Patient denies any pain. He states his only concern is his anxiety and feeling very nervous about starting new treatment and dealing with prostate cancer. Patient would like to discuss possible treatment options or ways he could increase his platelets.

## 2019-12-16 ENCOUNTER — Inpatient Hospital Stay: Payer: Medicare HMO

## 2019-12-16 ENCOUNTER — Inpatient Hospital Stay (HOSPITAL_BASED_OUTPATIENT_CLINIC_OR_DEPARTMENT_OTHER): Payer: Medicare HMO | Admitting: Oncology

## 2019-12-16 ENCOUNTER — Other Ambulatory Visit: Payer: Self-pay | Admitting: *Deleted

## 2019-12-16 ENCOUNTER — Other Ambulatory Visit: Payer: Self-pay

## 2019-12-16 VITALS — BP 129/86 | HR 62 | Temp 96.5°F | Resp 16 | Wt 242.7 lb

## 2019-12-16 VITALS — BP 134/79 | HR 49

## 2019-12-16 DIAGNOSIS — D696 Thrombocytopenia, unspecified: Secondary | ICD-10-CM

## 2019-12-16 DIAGNOSIS — C61 Malignant neoplasm of prostate: Secondary | ICD-10-CM

## 2019-12-16 DIAGNOSIS — D500 Iron deficiency anemia secondary to blood loss (chronic): Secondary | ICD-10-CM

## 2019-12-16 DIAGNOSIS — C7951 Secondary malignant neoplasm of bone: Secondary | ICD-10-CM | POA: Diagnosis not present

## 2019-12-16 DIAGNOSIS — Z5111 Encounter for antineoplastic chemotherapy: Secondary | ICD-10-CM | POA: Diagnosis not present

## 2019-12-16 LAB — COMPREHENSIVE METABOLIC PANEL
ALT: 17 U/L (ref 0–44)
AST: 24 U/L (ref 15–41)
Albumin: 4 g/dL (ref 3.5–5.0)
Alkaline Phosphatase: 355 U/L — ABNORMAL HIGH (ref 38–126)
Anion gap: 9 (ref 5–15)
BUN: 19 mg/dL (ref 8–23)
CO2: 26 mmol/L (ref 22–32)
Calcium: 8.6 mg/dL — ABNORMAL LOW (ref 8.9–10.3)
Chloride: 105 mmol/L (ref 98–111)
Creatinine, Ser: 1.32 mg/dL — ABNORMAL HIGH (ref 0.61–1.24)
GFR calc Af Amer: >60 mL/min (ref >60)
GFR calc non Af Amer: 55 mL/min — ABNORMAL LOW (ref >60)
Glucose, Bld: 95 mg/dL (ref 70–99)
Potassium: 4.1 mmol/L (ref 3.5–5.1)
Sodium: 140 mmol/L (ref 135–145)
Total Bilirubin: 0.7 mg/dL (ref 0.3–1.2)
Total Protein: 6.6 g/dL (ref 6.5–8.1)

## 2019-12-16 LAB — CBC WITH DIFFERENTIAL/PLATELET
Abs Immature Granulocytes: 0.01 10*3/uL (ref 0.00–0.07)
Basophils Absolute: 0 10*3/uL (ref 0.0–0.1)
Basophils Relative: 1 %
Eosinophils Absolute: 0.1 10*3/uL (ref 0.0–0.5)
Eosinophils Relative: 2 %
HCT: 37.9 % — ABNORMAL LOW (ref 39.0–52.0)
Hemoglobin: 13.2 g/dL (ref 13.0–17.0)
Immature Granulocytes: 0 %
Lymphocytes Relative: 22 %
Lymphs Abs: 0.9 10*3/uL (ref 0.7–4.0)
MCH: 30.6 pg (ref 26.0–34.0)
MCHC: 34.8 g/dL (ref 30.0–36.0)
MCV: 87.9 fL (ref 80.0–100.0)
Monocytes Absolute: 0.3 10*3/uL (ref 0.1–1.0)
Monocytes Relative: 7 %
Neutro Abs: 2.6 10*3/uL (ref 1.7–7.7)
Neutrophils Relative %: 68 %
Platelets: 103 10*3/uL — ABNORMAL LOW (ref 150–400)
RBC: 4.31 MIL/uL (ref 4.22–5.81)
RDW: 13.2 % (ref 11.5–15.5)
WBC: 3.9 10*3/uL — ABNORMAL LOW (ref 4.0–10.5)
nRBC: 0 % (ref 0.0–0.2)

## 2019-12-16 LAB — PSA: Prostatic Specific Antigen: 3.27 ng/mL (ref 0.00–4.00)

## 2019-12-16 MED ORDER — SODIUM CHLORIDE 0.9 % IV SOLN
75.0000 mg/m2 | Freq: Once | INTRAVENOUS | Status: AC
  Administered 2019-12-16: 180 mg via INTRAVENOUS
  Filled 2019-12-16: qty 18

## 2019-12-16 MED ORDER — HEPARIN SOD (PORK) LOCK FLUSH 100 UNIT/ML IV SOLN
INTRAVENOUS | Status: AC
  Filled 2019-12-16: qty 5

## 2019-12-16 MED ORDER — SODIUM CHLORIDE 0.9 % IV SOLN
Freq: Once | INTRAVENOUS | Status: AC
  Filled 2019-12-16: qty 250

## 2019-12-16 MED ORDER — SODIUM CHLORIDE 0.9% FLUSH
10.0000 mL | INTRAVENOUS | Status: DC | PRN
  Administered 2019-12-16: 10 mL
  Filled 2019-12-16: qty 10

## 2019-12-16 MED ORDER — LORAZEPAM 0.5 MG PO TABS
0.5000 mg | ORAL_TABLET | Freq: Every day | ORAL | 0 refills | Status: DC
Start: 2019-12-16 — End: 2020-03-09

## 2019-12-16 MED ORDER — PEGFILGRASTIM 6 MG/0.6ML ~~LOC~~ PSKT
6.0000 mg | PREFILLED_SYRINGE | Freq: Once | SUBCUTANEOUS | Status: AC
  Administered 2019-12-16: 6 mg via SUBCUTANEOUS
  Filled 2019-12-16: qty 0.6

## 2019-12-16 MED ORDER — DEGARELIX ACETATE 80 MG ~~LOC~~ SOLR
80.0000 mg | Freq: Once | SUBCUTANEOUS | Status: AC
  Administered 2019-12-16: 80 mg via SUBCUTANEOUS
  Filled 2019-12-16: qty 4

## 2019-12-16 MED ORDER — SODIUM CHLORIDE 0.9 % IV SOLN
10.0000 mg | Freq: Once | INTRAVENOUS | Status: AC
  Administered 2019-12-16: 10 mg via INTRAVENOUS
  Filled 2019-12-16: qty 10

## 2019-12-16 MED ORDER — HEPARIN SOD (PORK) LOCK FLUSH 100 UNIT/ML IV SOLN
500.0000 [IU] | Freq: Once | INTRAVENOUS | Status: AC | PRN
  Administered 2019-12-16: 500 [IU]
  Filled 2019-12-16: qty 5

## 2019-12-16 NOTE — Telephone Encounter (Signed)
Pt was in clinic today and April sent a message that he needed anxiety med. And has trouble sleeping also. Dr Janese Banks said ativan 0.5 mg at night only, we are sending it to pharmacy this afternoon. I left this message on pt voicemail when I called and no one answered

## 2019-12-18 NOTE — Progress Notes (Signed)
Hematology/Oncology Consult note [facility]  [phone] [phone]  Patient Care Team: Kirk Ruths, MD as PCP - General (Internal Medicine) Nickie Retort, MD as Consulting Physician (Urology) Lucilla Lame, MD as Consulting Physician (Gastroenterology) Lovell Sheehan, MD as Consulting Physician (Orthopedic Surgery) Sindy Guadeloupe, MD as Consulting Physician (Oncology)   Name of the patient: Dylan Ho  [id number]  [birth year]   Date of visit: 12/18/19  Diagnosis-metastatic prostate cancer with bone metastases  Chief complaint/ Reason for visit-on treatment assessment prior to cycle 1 of docetaxel chemotherapy  Heme/Onc history: Patient is a 69 year old male with a past medical history significant for alcohol-related cirrhosis.  He has not had any ascites or labs suggestive of chronic liver disease.  He does not drink alcohol anymore.  Patient was seen by scale for ongoing urinary symptoms of frequency especially at night.  This was followed by a CT renal stone study which showed cyst in the upper pole of the right kidney as well as left kidney.  S/p gastric bypass.  Pelvic node enlargement in the right pelvis 11 mm.  Bulky rounded lymph node along the external iliac chain 17 mm.  Prostate was enlarged measuring 4.3 cm.  Right obturator lymph node 1 cm.  Legnet1 measuring 14 mm.  Sclerosis of L3.  Left hemisacrum with sclerotic lesion.  Sclerosis of the superior pubic rami.  Large area of sclerosis affecting the right hemisacrum 4.7 x 2.4 cm.  Left ninth and 11th rib with sclerotic lesions compatible with bone metastases.  Right hip sclerosis.  PSA was elevated at 18.6.  Patient underwent 12 core prostate biopsy which showed adenocarcinoma Gleason score 9(4+5) grade group 5.   Omniseq testing showed evidence of BRCA2 somatic mutation.  [id number] deletion.  PI K3 R1 copy number loss.  ABO Rh to a 75%.  CD30 955%.  Patient not  noted to have ATM mutation or any TRK mutation.  PD-L1 less than 1%.  Tumor mutational burden low.   Interval history-patient is currently doing well and other than mild fatigue he denies other complaints at this time.  ECOG PS- 1 Pain scale- 0   Review of systems- Review of Systems  Constitutional: Positive for malaise/fatigue. Negative for chills, fever and weight loss.  HENT: Negative for congestion, ear discharge and nosebleeds.   Eyes: Negative for blurred vision.  Respiratory: Negative for cough, hemoptysis, sputum production, shortness of breath and wheezing.   Cardiovascular: Negative for chest pain, palpitations, orthopnea and claudication.  Gastrointestinal: Negative for abdominal pain, blood in stool, constipation, diarrhea, heartburn, melena, nausea and vomiting.  Genitourinary: Negative for dysuria, flank pain, frequency, hematuria and urgency.  Musculoskeletal: Negative for back pain, joint pain and myalgias.  Skin: Negative for rash.  Neurological: Negative for dizziness, tingling, focal weakness, seizures, weakness and headaches.  Endo/Heme/Allergies: Does not bruise/bleed easily.  Psychiatric/Behavioral: Negative for depression and suicidal ideas. The patient does not have insomnia.       No Known Allergies   Past Medical History:  Diagnosis Date  . Anemia   . Arthritis    knees, shoulders  . CAD (coronary artery disease)    No Stents Present- per patient  . Cirrhosis ([place])    mild  . Colon polyps   . Essential hypertension 08/24/2008  . GERD (gastroesophageal reflux disease)    patient denies  . History of alcohol abuse Last Drink- 2011  . History of gout   . Hypertension  no longer on meds  . Hypothyroidism   . Intestinal ulcer   . Iron deficiency anemia due to chronic blood loss 02/27/2017  . Other pancytopenia ([place]) 02/25/2017  . Overactive bladder   . Sleep apnea    improved since gastric bypass  . Thyroid disease      Past Surgical  History:  Procedure Laterality Date  . BILATERAL TOTAL SHOULDER ARTHROPLASTY  1990's  . CIRCUMCISION REVISION N/A 03/13/2017   Procedure: CIRCUMCISION REVISION;  Surgeon: Nickie Retort, MD;  Location: [facility] ORS;  Service: Urology;  Laterality: N/A;  . COLONOSCOPY WITH PROPOFOL N/A 10/30/2016   Procedure: COLONOSCOPY WITH PROPOFOL;  Surgeon: Lucilla Lame, MD;  Location: [facility] ENDOSCOPY;  Service: Endoscopy;  Laterality: N/A;  . ESOPHAGOGASTRODUODENOSCOPY (EGD) WITH PROPOFOL N/A 10/30/2016   Procedure: ESOPHAGOGASTRODUODENOSCOPY (EGD) WITH PROPOFOL;  Surgeon: Lucilla Lame, MD;  Location: [facility] END[facility]COPY;  Service: Endoscopy;  Laterality: N/A;  . ESOPHAGOGASTRODUODENOSCOPY (EGD) WITH PROPOFOL N/A 03/26/2017   Procedure: ESOPHAGOGASTRODUODENOSCOPY (EGD) WITH PROPOFOL;  Surgeon: Lucilla Lame, MD;  Location: [place];  Service: Gastroenterology;  Laterality: N/A;  requests early  . GASTRIC BYPASS  2016   Dr. Darnell Level- [facility] [place] ARTHROSCOPY  1990s  . KNEE ARTHROSCOPY W/ MENISCAL REPAIR Bilateral   . KNEE ARTHROSCOPY WITH MEDIAL MENISECTOMY Left 04/21/2019   Procedure: KNEE ARTHROSCOPY WITH MEDIAL MENISECTOMY;  Surgeon: Lovell Sheehan, MD;  Location: [place];  Service: Orthopedics;  Laterality: Left;  . PORTA CATH INSERTION N/A 11/23/2019   Procedure: PORTA CATH INSERTION;  Surgeon: Algernon Huxley, MD;  Location: [place] CV LAB;  Service: Cardiovascular;  Laterality: N/A;  . TONSILLECTOMY  2004    Social History   Socioeconomic History  . Marital status: Married    Spouse name: Not on file  . Number of children: Not on file  . Years of education: Not on file  . Highest education level: Not on file  Occupational History  . Not on file  Tobacco Use  . Smoking status: Never Smoker  . Smokeless tobacco: Current User    Types: Chew  Vaping Use  . Vaping Use: Never used  Substance and Sexual Activity  . Alcohol use: No    Comment: Heavy ETOH in past- Last Drink  2011 per patient  . Drug use: No  . Sexual activity: Not on file  Other Topics Concern  . Not on file  Social History Narrative  . Not on file   Social Determinants of Health   Financial Resource Strain:   . Difficulty of Paying Living Expenses:   Food Insecurity:   . Worried About Charity fundraiser in the Last Year:   . Arboriculturist in the Last Year:   Transportation Needs:   . Film/video editor (Medical):   [name] Kitchen Lack of Transportation (Non-Medical):   Physical Activity:   . Days of Exercise per Week:   . Minutes of Exercise per Session:   Stress:   . Feeling of Stress :   Social Connections:   . Frequency of Communication with Friends and Family:   . Frequency of Social Gatherings with Friends and Family:   . Attends Religious Services:   . Active Member of Clubs or Organizations:   . Attends Archivist Meetings:   [name] Kitchen Marital Status:   Intimate Partner Violence:   . Fear of Current or Ex-Partner:   . Emotionally Abused:   [name] Kitchen Physically Abused:   . Sexually Abused:  Family History  Problem Relation Age of Onset  . Stroke Mother   . Breast cancer Mother   . Heart disease Father   . Stroke Father   . AAA (abdominal aortic aneurysm) Father   . Lung cancer Paternal Grandmother   . Prostate cancer Neg Hx   . Bladder Cancer Neg Hx   . Kidney cancer Neg Hx      Current Outpatient Medications:  .  dexamethasone (DECADRON) 4 MG tablet, Take 2 tablets (8 mg total) by mouth 2 (two) times daily. Start the day before Taxotere. Then daily after chemo for 2 days., Disp: 30 tablet, Rfl: 1 .  lactulose (CHRONULAC) 10 GM/15ML solution, TAKE 15MLS TWICE A DAY, Disp: 950 mL, Rfl: 1 .  levothyroxine (SYNTHROID, LEVOTHROID) 150 MCG tablet, Take 1 tablet (150 mcg total) by mouth daily before breakfast., Disp: 90 tablet, Rfl: 1 .  lidocaine-prilocaine (EMLA) cream, Apply to affected area once, Disp: 30 g, Rfl: 3 .  Multiple Vitamin (MULTIVITAMIN) capsule, Take 1  capsule by mouth daily., Disp: , Rfl:  .  ondansetron (ZOFRAN) 8 MG tablet, Take 1 tablet (8 mg total) by mouth 2 (two) times daily as needed for refractory nausea / vomiting., Disp: 30 tablet, Rfl: 1 .  oxybutynin (DITROPAN) 5 MG tablet, Take 1 tablet (5 mg total) by mouth every 8 (eight) hours as needed for bladder spasms., Disp: 90 tablet, Rfl: 0 .  pantoprazole (PROTONIX) 40 MG tablet, Take 1 tablet (40 mg total) by mouth 2 (two) times daily., Disp: 180 tablet, Rfl: 3 .  prochlorperazine (COMPAZINE) 10 MG tablet, Take 1 tablet (10 mg total) by mouth every 6 (six) hours as needed (Nausea or vomiting)., Disp: 30 tablet, Rfl: 1 .  tamsulosin (FLOMAX) 0.4 MG CAPS capsule, Take 1 capsule (0.4 mg total) by mouth daily., Disp: 30 capsule, Rfl: 3 .  topiramate (TOPAMAX) 25 MG tablet, Take by mouth., Disp: , Rfl:  .  traZODone (DESYREL) 150 MG tablet, Take 4 tablets (600 mg total) by mouth at bedtime., Disp: 360 tablet, Rfl: 1 .  vitamin B-12 (CYANOCOBALAMIN) 500 MCG tablet, Take 1,000 mcg by mouth daily. , Disp: , Rfl:  .  LORazepam (ATIVAN) 0.5 MG tablet, Take 1 tablet (0.5 mg total) by mouth at bedtime., Disp: 30 tablet, Rfl: 0  Physical exam:  Vitals:   12/16/19 0900  BP: 129/86  Pulse: 62  Resp: 16  Temp: (!) 96.5 F (35.8 C)  TempSrc: Tympanic  SpO2: 98%  Weight: 242 lb 11.2 oz (110.1 kg)   Physical Exam Constitutional:      General: He is not in acute distress. Cardiovascular:     Rate and Rhythm: Normal rate and regular rhythm.     Heart sounds: Normal heart sounds.  Pulmonary:     Effort: Pulmonary effort is normal.     Breath sounds: Normal breath sounds.  Abdominal:     General: Bowel sounds are normal.     Palpations: Abdomen is soft.  Skin:    General: Skin is warm and dry.  Neurological:     Mental Status: He is alert and oriented to person, place, and time.      CMP Latest Ref Rng & Units 12/16/2019  Glucose 70 - 99 mg/dL 95  BUN 8 - 23 mg/dL 19  Creatinine 0.61  - 1.24 mg/dL 1.32(H)  Sodium 135 - 145 mmol/L 140  Potassium 3.5 - 5.1 mmol/L 4.1  Chloride 98 - 111 mmol/L 105  CO2 22 -  32 mmol/L 26  Calcium 8.9 - 10.3 mg/dL 8.6(L)  Total Protein 6.5 - 8.1 g/dL 6.6  Total Bilirubin 0.3 - 1.2 mg/dL 0.7  Alkaline Phos 38 - 126 U/L 355(H)  AST 15 - 41 U/L 24  ALT 0 - 44 U/L 17   CBC Latest Ref Rng & Units 12/16/2019  WBC 4.0 - 10.5 K/uL 3.9(L)  Hemoglobin 13.0 - 17.0 g/dL 13.2  Hematocrit 39 - 52 % 37.9(L)  Platelets 150 - 400 K/uL 103(L)    No images are attached to the encounter.  CT Chest Wo Contrast  Result Date: 11/25/2019 CLINICAL DATA:  Prostate cancer with bone metastasis. Pre chemotherapy. Nonsmoker. Elevated creatinine. EXAM: CT CHEST WITHOUT CONTRAST TECHNIQUE: Multidetector CT imaging of the chest was performed following the standard protocol without IV contrast. COMPARISON:  11/16/2019 bone scan. Chest radiograph 03/15/2016. FINDINGS: Cardiovascular: Right Port-A-Cath tip at low SVC. Aortic and branch vessel atherosclerosis. Tortuous thoracic aorta. Borderline cardiomegaly, without pericardial effusion. Multivessel coronary artery atherosclerosis. Mediastinum/Nodes: No supraclavicular adenopathy. No mediastinal or definite hilar adenopathy, given limitations of unenhanced CT. Lungs/Pleura: No pleural fluid. Accessory right upper lobe bronchus including on 56/3. Clear lungs. Upper Abdomen: Cirrhosis. Enlarged periumbilical vein. 4 mm gallstone. Normal imaged portions of the spleen, adrenal glands, kidneys. Status post gastric bypass. Musculoskeletal: Moderate bilateral gynecomastia. Multifocal sclerotic osseous metastasis. An index right eighth posterior right rib lesion measures 3.0 cm on 87/2. Posterior T7 vertebral body lesion measures 1.0 cm on sagittal image 92. The T6 vertebral body is diffusely sclerotic. IMPRESSION: 1. Multifocal sclerotic osseous metastasis. 2. No evidence of soft tissue metastasis within the chest. 3. Cirrhosis. 4.  Cholelithiasis. 5. Bilateral gynecomastia. 6. Coronary artery atherosclerosis. Aortic Atherosclerosis (ICD10-I70.0). Electronically Signed   By: Abigail Miyamoto M.D.   On: 11/25/2019 13:48   PERIPHERAL VASCULAR CATHETERIZATION  Result Date: 11/23/2019 See op note    Assessment and plan- Patient is a 69 y.o. male with castrate sensitive metastatic prostate cancer here for on treatment assessment prior to cycle 1 of docetaxel chemotherapy  1.  Counseling to proceed with cycle 1 of docetaxel chemotherapy with on for Neulasta support.  I will see him back in 3 weeks time for cycle 2.  We will also see him back in 10 days to repeat his CBC with differential and monitor his platelet count given his baseline thrombocytopenia.  2.  Patient does have baseline thrombocytopenia with a platelet count in the 100s even prior to starting chemotherapy.  It is unclear if this is secondary to ITP versus cirrhosis.  Patient mentions that he may have been given von Willebrand'sDiagnosis in the past.  I will check von Willebrand panel at my next visit.  3.  Patient received first dose of Firmagon last month and will receive his second dose today.  Following this I will be switching him to 45-monthdose of Lupron. Patient's PSA which was elevated at 18.6 in April has come down to 3.27 presently.   Visit Diagnosis 1. [place]   2. Encounter for antineoplastic chemotherapy   3. Prostate cancer metastatic to bone ([facility]      Dr. ARanda Evens MD, MPH [id number]/13/2021 7:18 AM

## 2019-12-18 NOTE — Addendum Note (Signed)
Addended by: Sindy Guadeloupe on: 12/18/2019 07:27 AM   Modules accepted: Level of Service

## 2019-12-19 ENCOUNTER — Telehealth: Payer: Self-pay | Admitting: *Deleted

## 2019-12-19 ENCOUNTER — Telehealth: Payer: Self-pay

## 2019-12-19 NOTE — Telephone Encounter (Signed)
T/C to pt for follow up after first chemo.   Pt states he felt "bad" all weekend and now "achy".   Asked pt if he has taken the Claritin due to receiving Neulasta injection and pt says he has not taken.   Encouraged pt to take Claritin and when receiving second dose to start Claritin the day of chemo and take one tab 4 days straight to alleviate bone achy ness.   Pt states is drinking fluids well and eating "okay".  Pt states was offered to come in today and get labs and possible hydration but pt declined.   Encouraged pt to call for any questions or concerns.

## 2019-12-19 NOTE — Telephone Encounter (Signed)
Per Dr. Elroy Channel request, called patient's wife to schedule an appointment for labs, evaluation in the symptom management clinic and possible IV fluids. Spoke to patient's wife via telephone. She was there with him and asked him if he wanted to come in today. He said that he was feeling better today and he didn't feel like he needed to be seen in the clinic. Encouraged her to call us back if he starts to feel any worse and she verbalized understanding.

## 2019-12-20 ENCOUNTER — Ambulatory Visit: Payer: Medicare HMO | Admitting: Oncology

## 2019-12-26 ENCOUNTER — Telehealth: Payer: Self-pay | Admitting: *Deleted

## 2019-12-26 ENCOUNTER — Other Ambulatory Visit: Payer: Self-pay | Admitting: *Deleted

## 2019-12-26 DIAGNOSIS — C61 Malignant neoplasm of prostate: Secondary | ICD-10-CM

## 2019-12-26 NOTE — Telephone Encounter (Signed)
Pt had mentioned to me that he was unable to get EMLA cream last Thursday. I called Friday and got [employer] and did the prior auth on cover my med. It was approved and it still cost 46 or 48 dollars but the pharmacy used [facility] and it cost 16 dollars

## 2019-12-27 ENCOUNTER — Encounter: Payer: Self-pay | Admitting: Oncology

## 2019-12-27 ENCOUNTER — Other Ambulatory Visit: Payer: Self-pay

## 2019-12-27 ENCOUNTER — Inpatient Hospital Stay: Payer: Medicare HMO

## 2019-12-27 ENCOUNTER — Inpatient Hospital Stay (HOSPITAL_BASED_OUTPATIENT_CLINIC_OR_DEPARTMENT_OTHER): Payer: Medicare HMO | Admitting: Oncology

## 2019-12-27 ENCOUNTER — Ambulatory Visit: Payer: Medicare HMO | Admitting: Oncology

## 2019-12-27 ENCOUNTER — Other Ambulatory Visit: Payer: Medicare HMO

## 2019-12-27 VITALS — BP 148/89 | HR 67 | Temp 95.6°F | Resp 18 | Wt 239.5 lb

## 2019-12-27 DIAGNOSIS — C7951 Secondary malignant neoplasm of bone: Secondary | ICD-10-CM

## 2019-12-27 DIAGNOSIS — D696 Thrombocytopenia, unspecified: Secondary | ICD-10-CM | POA: Diagnosis not present

## 2019-12-27 DIAGNOSIS — Z5111 Encounter for antineoplastic chemotherapy: Secondary | ICD-10-CM | POA: Diagnosis not present

## 2019-12-27 DIAGNOSIS — C61 Malignant neoplasm of prostate: Secondary | ICD-10-CM | POA: Diagnosis not present

## 2019-12-27 LAB — CBC WITH DIFFERENTIAL/PLATELET
Abs Immature Granulocytes: 1.2 10*3/uL — ABNORMAL HIGH (ref 0.00–0.07)
Basophils Absolute: 0.1 10*3/uL (ref 0.0–0.1)
Basophils Relative: 1 %
Eosinophils Absolute: 0 10*3/uL (ref 0.0–0.5)
Eosinophils Relative: 0 %
HCT: 39.4 % (ref 39.0–52.0)
Hemoglobin: 13.4 g/dL (ref 13.0–17.0)
Immature Granulocytes: 12 %
Lymphocytes Relative: 10 %
Lymphs Abs: 1 10*3/uL (ref 0.7–4.0)
MCH: 30.2 pg (ref 26.0–34.0)
MCHC: 34 g/dL (ref 30.0–36.0)
MCV: 88.7 fL (ref 80.0–100.0)
Monocytes Absolute: 0.4 10*3/uL (ref 0.1–1.0)
Monocytes Relative: 4 %
Neutro Abs: 7.5 10*3/uL (ref 1.7–7.7)
Neutrophils Relative %: 73 %
Platelets: 106 10*3/uL — ABNORMAL LOW (ref 150–400)
RBC: 4.44 MIL/uL (ref 4.22–5.81)
RDW: 13.3 % (ref 11.5–15.5)
WBC: 10.2 10*3/uL (ref 4.0–10.5)
nRBC: 0.5 % — ABNORMAL HIGH (ref 0.0–0.2)

## 2019-12-27 LAB — COMPREHENSIVE METABOLIC PANEL
ALT: 20 U/L (ref 0–44)
AST: 29 U/L (ref 15–41)
Albumin: 3.9 g/dL (ref 3.5–5.0)
Alkaline Phosphatase: 281 U/L — ABNORMAL HIGH (ref 38–126)
Anion gap: 9 (ref 5–15)
BUN: 10 mg/dL (ref 8–23)
CO2: 29 mmol/L (ref 22–32)
Calcium: 8.6 mg/dL — ABNORMAL LOW (ref 8.9–10.3)
Chloride: 102 mmol/L (ref 98–111)
Creatinine, Ser: 1.19 mg/dL (ref 0.61–1.24)
GFR calc Af Amer: >60 mL/min (ref >60)
GFR calc non Af Amer: >60 mL/min (ref >60)
Glucose, Bld: 99 mg/dL (ref 70–99)
Potassium: 4 mmol/L (ref 3.5–5.1)
Sodium: 140 mmol/L (ref 135–145)
Total Bilirubin: 0.8 mg/dL (ref 0.3–1.2)
Total Protein: 6.6 g/dL (ref 6.5–8.1)

## 2019-12-30 LAB — VON WILLEBRAND PANEL
Coagulation Factor VIII: 167 % — ABNORMAL HIGH (ref 56–140)
Ristocetin Co-factor, Plasma: 121 % (ref 50–200)
Von Willebrand Antigen, Plasma: 186 % (ref 50–200)

## 2019-12-30 LAB — COAG STUDIES INTERP REPORT

## 2019-12-30 NOTE — Progress Notes (Signed)
Hematology/Oncology Consult note [facility]  Telephone:([phone] [phone]  Patient Care Team: Kirk Ruths, MD as PCP - General (Internal Medicine) Nickie Retort, MD as Consulting Physician (Urology) Lucilla Lame, MD as Consulting Physician (Gastroenterology) Lovell Sheehan, MD as Consulting Physician (Orthopedic Surgery) Sindy Guadeloupe, MD as Consulting Physician (Oncology)   Name of the patient: Dylan Ho  [id number]  [birth year]   Date of visit: 12/30/19  Diagnosis- metastatic prostate cancer with bone metastases   Chief complaint/ Reason for visit-toxicity check after cycle 1 of docetaxel chemotherapy  Heme/Onc history: Patient is a 69 year old male with a past medical history significant for alcohol-related cirrhosis. He has not had any ascites or labs suggestive of chronic liver disease. He does not drink alcohol anymore. Patient was seen by scale for ongoing urinary symptoms of frequency especially at night. This was followed by a CT renal stone study which showed cyst in the upper pole of the right kidney as well as left kidney. S/p gastric bypass. Pelvic node enlargement in the right pelvis 11 mm. Bulky rounded lymph node along the external iliac chain 17 mm. Prostate was enlarged measuring 4.3 cm. Right obturator lymph node 1 cm. Legnet1 measuring 14 mm. Sclerosis of L3. Left hemisacrum with sclerotic lesion. Sclerosis of the superior pubic rami. Large area of sclerosis affecting the right hemisacrum 4.7 x 2.4 cm. Left ninth and 11th rib with sclerotic lesions compatible with bone metastases. Right hip sclerosis. PSA was elevated at 18.6.  Patient underwent 12 core prostate biopsy which showed adenocarcinoma Gleason score 9(4+5)grade group 5.   Omniseq testing showed evidence of BRCA2 somatic mutation.  [id number] deletion.  PI K3 R1 copy number loss. Patient not noted to have ATM mutation or any TRK  mutation.  PD-L1 less than 1%.  Tumor mutational burden low.  Interval history-he reports having fatigue 2 days after chemotherapy and a low blood pressure at home with a systolic blood pressure running in the 70s after chemo.  We did ask him to come to symptom management clinic but patient states that he felt better after 2 days and did not feel that he wanted to go for any further IV fluids.  Currently patient feels well and denies any complaints at this time and almost feels back to his baseline  ECOG PS- 1 Pain scale- 0   Review of systems- Review of Systems  Constitutional: Positive for malaise/fatigue. Negative for chills, fever and weight loss.  HENT: Negative for congestion, ear discharge and nosebleeds.   Eyes: Negative for blurred vision.  Respiratory: Negative for cough, hemoptysis, sputum production, shortness of breath and wheezing.   Cardiovascular: Negative for chest pain, palpitations, orthopnea and claudication.  Gastrointestinal: Negative for abdominal pain, blood in stool, constipation, diarrhea, heartburn, melena, nausea and vomiting.  Genitourinary: Negative for dysuria, flank pain, frequency, hematuria and urgency.  Musculoskeletal: Negative for back pain, joint pain and myalgias.  Skin: Negative for rash.  Neurological: Negative for dizziness, tingling, focal weakness, seizures, weakness and headaches.  Endo/Heme/Allergies: Does not bruise/bleed easily.  Psychiatric/Behavioral: Negative for depression and suicidal ideas. The patient does not have insomnia.       No Known Allergies   Past Medical History:  Diagnosis Date   Anemia    Arthritis    knees, shoulders   CAD (coronary artery disease)    No Stents Present- per patient   Cirrhosis ([place])    mild   Colon polyps    Essential  hypertension 08/24/2008   GERD (gastroesophageal reflux disease)    patient denies   History of alcohol abuse Last Drink- 2011   History of gout    Hypertension     no longer on meds   Hypothyroidism    Intestinal ulcer    Iron deficiency anemia due to chronic blood loss 02/27/2017   Other pancytopenia ([place]) 02/25/2017   Overactive bladder    Sleep apnea    improved since gastric bypass   Thyroid disease      Past Surgical History:  Procedure Laterality Date   BILATERAL TOTAL SHOULDER ARTHROPLASTY  1990's   CIRCUMCISION REVISION N/A 03/13/2017   Procedure: CIRCUMCISION REVISION;  Surgeon: Nickie Retort, MD;  Location: [facility];  Service: Urology;  Laterality: N/A;   COLONOSCOPY WITH PROPOFOL N/A 10/30/2016   Procedure: COLONOSCOPY WITH PROPOFOL;  Surgeon: Lucilla Lame, MD;  Location: [facility];  Service: Endoscopy;  Laterality: N/A;   ESOPHAGOGASTRODUODENOSCOPY (EGD) WITH PROPOFOL N/A 10/30/2016   Procedure: ESOPHAGOGASTRODUODENOSCOPY (EGD) WITH PROPOFOL;  Surgeon: Lucilla Lame, MD;  Location: [facility];  Service: Endoscopy;  Laterality: N/A;   ESOPHAGOGASTRODUODENOSCOPY (EGD) WITH PROPOFOL N/A 03/26/2017   Procedure: ESOPHAGOGASTRODUODENOSCOPY (EGD) WITH PROPOFOL;  Surgeon: Lucilla Lame, MD;  Location: [place];  Service: Gastroenterology;  Laterality: N/A;  requests early   GASTRIC BYPASS  2016   Dr. Darnell Level- [facility] [place]   KNEE ARTHROSCOPY  1990s   KNEE ARTHROSCOPY W/ MENISCAL REPAIR Bilateral    KNEE ARTHROSCOPY WITH MEDIAL MENISECTOMY Left 04/21/2019   Procedure: KNEE ARTHROSCOPY WITH MEDIAL MENISECTOMY;  Surgeon: Lovell Sheehan, MD;  Location: [place];  Service: Orthopedics;  Laterality: Left;   PORTA CATH INSERTION N/A 11/23/2019   Procedure: PORTA CATH INSERTION;  Surgeon: Algernon Huxley, MD;  Location: [place] [facility];  Service: Cardiovascular;  Laterality: N/A;   TONSILLECTOMY  2004    Social History   Socioeconomic History   Marital status: Married    Spouse name: Not on file   Number of children: Not on file   Years of education: Not on file   Highest education level: Not on  file  Occupational History   Not on file  Tobacco Use   Smoking status: Never Smoker   Smokeless tobacco: Current User    Types: Chew  Vaping Use   Vaping Use: Never used  Substance and Sexual Activity   Alcohol use: No    Comment: Heavy ETOH in past- Last Drink 2011 per patient   Drug use: No   Sexual activity: Not on file  Other Topics Concern   Not on file  Social History Narrative   Not on file   Social Determinants of Health   Financial Resource Strain:    Difficulty of Paying Living Expenses:   Food Insecurity:    Worried About Charity fundraiser in the Last Year:    Arboriculturist in the Last Year:   Transportation Needs:    Film/video editor (Medical):    Lack of Transportation (Non-Medical):   Physical Activity:    Days of Exercise per Week:    Minutes of Exercise per Session:   Stress:    Feeling of Stress :   Social Connections:    Frequency of Communication with Friends and Family:    Frequency of Social Gatherings with Friends and Family:    Attends Religious Services:    Active Member of Clubs or Organizations:    Attends Archivist  Meetings:    Marital Status:   Intimate Partner Violence:    Fear of Current or Ex-Partner:    Emotionally Abused:    Physically Abused:    Sexually Abused:     Family History  Problem Relation Age of Onset   Stroke Mother    Breast cancer Mother    Heart disease Father    Stroke Father    AAA (abdominal aortic aneurysm) Father    Lung cancer Paternal Grandmother    Prostate cancer Neg Hx    Bladder Cancer Neg Hx    Kidney cancer Neg Hx      Current Outpatient Medications:    dexamethasone (DECADRON) 4 MG tablet, Take 2 tablets (8 mg total) by mouth 2 (two) times daily. Start the day before Taxotere. Then daily after chemo for 2 days., Disp: 30 tablet, Rfl: 1   lactulose (CHRONULAC) 10 GM/15ML solution, TAKE 15MLS TWICE A DAY, Disp: 950 mL, Rfl: 1    levothyroxine (SYNTHROID, LEVOTHROID) 150 MCG tablet, Take 1 tablet (150 mcg total) by mouth daily before breakfast., Disp: 90 tablet, Rfl: 1   lidocaine-prilocaine (EMLA) cream, Apply to affected area once, Disp: 30 g, Rfl: 3   LORazepam (ATIVAN) 0.5 MG tablet, Take 1 tablet (0.5 mg total) by mouth at bedtime., Disp: 30 tablet, Rfl: 0   Multiple Vitamin (MULTIVITAMIN) capsule, Take 1 capsule by mouth daily., Disp: , Rfl:    ondansetron (ZOFRAN) 8 MG tablet, Take 1 tablet (8 mg total) by mouth 2 (two) times daily as needed for refractory nausea / vomiting., Disp: 30 tablet, Rfl: 1   pantoprazole (PROTONIX) 40 MG tablet, Take 1 tablet (40 mg total) by mouth 2 (two) times daily., Disp: 180 tablet, Rfl: 3   prochlorperazine (COMPAZINE) 10 MG tablet, Take 1 tablet (10 mg total) by mouth every 6 (six) hours as needed (Nausea or vomiting)., Disp: 30 tablet, Rfl: 1   tamsulosin (FLOMAX) 0.4 MG CAPS capsule, Take 1 capsule (0.4 mg total) by mouth daily., Disp: 30 capsule, Rfl: 3   topiramate (TOPAMAX) 25 MG tablet, Take by mouth., Disp: , Rfl:    traZODone (DESYREL) 150 MG tablet, Take 4 tablets (600 mg total) by mouth at bedtime., Disp: 360 tablet, Rfl: 1   vitamin B-12 (CYANOCOBALAMIN) 500 MCG tablet, Take 1,000 mcg by mouth daily. , Disp: , Rfl:   Physical exam:  Vitals:   12/27/19 0842  BP: (!) 148/89  Pulse: 67  Resp: 18  Temp: (!) 95.6 F (35.3 C)  SpO2: 100%  Weight: 239 lb 8 oz (108.6 kg)   Physical Exam Constitutional:      General: He is not in acute distress. Cardiovascular:     Rate and Rhythm: Normal rate and regular rhythm.     Heart sounds: Normal heart sounds.  Pulmonary:     Effort: Pulmonary effort is normal.     Breath sounds: Normal breath sounds.  Abdominal:     General: Bowel sounds are normal.     Palpations: Abdomen is soft.  Musculoskeletal:     Cervical back: Normal range of motion.  Skin:    General: Skin is warm and dry.  Neurological:     Mental  Status: He is alert and oriented to person, place, and time.      CMP Latest Ref Rng & Units 12/27/2019  Glucose 70 - 99 mg/dL 99  BUN 8 - 23 mg/dL 10  Creatinine 0.61 - 1.24 mg/dL 1.19  Sodium 135 - 145 mmol/L  140  Potassium 3.5 - 5.1 mmol/L 4.0  Chloride 98 - 111 mmol/L 102  CO2 22 - 32 mmol/L 29  Calcium 8.9 - 10.3 mg/dL 8.6(L)  Total Protein 6.5 - 8.1 g/dL 6.6  Total Bilirubin 0.3 - 1.2 mg/dL 0.8  Alkaline Phos 38 - 126 U/L 281(H)  AST 15 - 41 U/L 29  ALT 0 - 44 U/L 20   CBC Latest Ref Rng & Units 12/27/2019  WBC 4.0 - 10.5 K/uL 10.2  Hemoglobin 13.0 - 17.0 g/dL 13.4  Hematocrit 39 - 52 % 39.4  Platelets 150 - 400 K/uL 106(L)      Assessment and plan- Patient is a 69 y.o. male with castrate sensitive metastatic prostate cancer.  He is here for toxicity check after cycle 1 of docetaxel chemotherapy  Currently patient is doing well and his blood pressure is stable.  He does not require any IV fluids today.  I will see him on 01/06/2020 with CBC with differential CMP and PSA and he received cycle 2 of docetaxel with on pro-Neulasta support on that day.  We will also plan to give him 1 L of normal saline on that day.  Also plan for extra IV fluids on 01/09/2020.  Next dose of Lupron due on 01/13/2020.   Visit Diagnosis 1. Prostate cancer metastatic to bone ([place])   2. Thrombocytopenia ([place])      Dr. Randa Evens, MD, MPH [facility] [phone] 12/30/2019 8:58 AM

## 2020-01-06 ENCOUNTER — Inpatient Hospital Stay (HOSPITAL_BASED_OUTPATIENT_CLINIC_OR_DEPARTMENT_OTHER): Payer: Medicare HMO | Admitting: Oncology

## 2020-01-06 ENCOUNTER — Other Ambulatory Visit: Payer: Self-pay

## 2020-01-06 ENCOUNTER — Inpatient Hospital Stay: Payer: Medicare HMO | Attending: Oncology

## 2020-01-06 ENCOUNTER — Inpatient Hospital Stay: Payer: Medicare HMO

## 2020-01-06 ENCOUNTER — Encounter: Payer: Self-pay | Admitting: Oncology

## 2020-01-06 VITALS — BP 127/68 | HR 57 | Temp 96.8°F | Resp 16 | Ht 71.0 in | Wt 239.0 lb

## 2020-01-06 DIAGNOSIS — K7030 Alcoholic cirrhosis of liver without ascites: Secondary | ICD-10-CM | POA: Diagnosis not present

## 2020-01-06 DIAGNOSIS — C7951 Secondary malignant neoplasm of bone: Secondary | ICD-10-CM

## 2020-01-06 DIAGNOSIS — M1990 Unspecified osteoarthritis, unspecified site: Secondary | ICD-10-CM | POA: Diagnosis not present

## 2020-01-06 DIAGNOSIS — D696 Thrombocytopenia, unspecified: Secondary | ICD-10-CM | POA: Diagnosis not present

## 2020-01-06 DIAGNOSIS — R5381 Other malaise: Secondary | ICD-10-CM | POA: Diagnosis not present

## 2020-01-06 DIAGNOSIS — R5383 Other fatigue: Secondary | ICD-10-CM | POA: Insufficient documentation

## 2020-01-06 DIAGNOSIS — Z5189 Encounter for other specified aftercare: Secondary | ICD-10-CM | POA: Insufficient documentation

## 2020-01-06 DIAGNOSIS — I10 Essential (primary) hypertension: Secondary | ICD-10-CM | POA: Diagnosis not present

## 2020-01-06 DIAGNOSIS — Z9884 Bariatric surgery status: Secondary | ICD-10-CM | POA: Diagnosis not present

## 2020-01-06 DIAGNOSIS — C61 Malignant neoplasm of prostate: Secondary | ICD-10-CM

## 2020-01-06 DIAGNOSIS — I2510 Atherosclerotic heart disease of native coronary artery without angina pectoris: Secondary | ICD-10-CM | POA: Insufficient documentation

## 2020-01-06 DIAGNOSIS — Z79899 Other long term (current) drug therapy: Secondary | ICD-10-CM | POA: Insufficient documentation

## 2020-01-06 DIAGNOSIS — G4730 Sleep apnea, unspecified: Secondary | ICD-10-CM | POA: Diagnosis not present

## 2020-01-06 DIAGNOSIS — Z5111 Encounter for antineoplastic chemotherapy: Secondary | ICD-10-CM | POA: Insufficient documentation

## 2020-01-06 DIAGNOSIS — Z7952 Long term (current) use of systemic steroids: Secondary | ICD-10-CM | POA: Diagnosis not present

## 2020-01-06 DIAGNOSIS — K219 Gastro-esophageal reflux disease without esophagitis: Secondary | ICD-10-CM | POA: Insufficient documentation

## 2020-01-06 DIAGNOSIS — E039 Hypothyroidism, unspecified: Secondary | ICD-10-CM | POA: Diagnosis not present

## 2020-01-06 LAB — CBC WITH DIFFERENTIAL/PLATELET
Abs Immature Granulocytes: 0.02 10*3/uL (ref 0.00–0.07)
Basophils Absolute: 0.1 10*3/uL (ref 0.0–0.1)
Basophils Relative: 3 %
Eosinophils Absolute: 0 10*3/uL (ref 0.0–0.5)
Eosinophils Relative: 0 %
HCT: 35.4 % — ABNORMAL LOW (ref 39.0–52.0)
Hemoglobin: 12.3 g/dL — ABNORMAL LOW (ref 13.0–17.0)
Immature Granulocytes: 1 %
Lymphocytes Relative: 25 %
Lymphs Abs: 0.9 10*3/uL (ref 0.7–4.0)
MCH: 31.1 pg (ref 26.0–34.0)
MCHC: 34.7 g/dL (ref 30.0–36.0)
MCV: 89.6 fL (ref 80.0–100.0)
Monocytes Absolute: 0.3 10*3/uL (ref 0.1–1.0)
Monocytes Relative: 9 %
Neutro Abs: 2.2 10*3/uL (ref 1.7–7.7)
Neutrophils Relative %: 62 %
Platelets: 179 10*3/uL (ref 150–400)
RBC: 3.95 MIL/uL — ABNORMAL LOW (ref 4.22–5.81)
RDW: 14.6 % (ref 11.5–15.5)
WBC: 3.5 10*3/uL — ABNORMAL LOW (ref 4.0–10.5)
nRBC: 0 % (ref 0.0–0.2)

## 2020-01-06 LAB — COMPREHENSIVE METABOLIC PANEL
ALT: 17 U/L (ref 0–44)
AST: 26 U/L (ref 15–41)
Albumin: 3.9 g/dL (ref 3.5–5.0)
Alkaline Phosphatase: 194 U/L — ABNORMAL HIGH (ref 38–126)
Anion gap: 8 (ref 5–15)
BUN: 15 mg/dL (ref 8–23)
CO2: 28 mmol/L (ref 22–32)
Calcium: 8.6 mg/dL — ABNORMAL LOW (ref 8.9–10.3)
Chloride: 103 mmol/L (ref 98–111)
Creatinine, Ser: 1.18 mg/dL (ref 0.61–1.24)
GFR calc Af Amer: >60 mL/min (ref >60)
GFR calc non Af Amer: >60 mL/min (ref >60)
Glucose, Bld: 93 mg/dL (ref 70–99)
Potassium: 4.2 mmol/L (ref 3.5–5.1)
Sodium: 139 mmol/L (ref 135–145)
Total Bilirubin: 0.7 mg/dL (ref 0.3–1.2)
Total Protein: 6.4 g/dL — ABNORMAL LOW (ref 6.5–8.1)

## 2020-01-06 LAB — PSA: Prostatic Specific Antigen: 2.36 ng/mL (ref 0.00–4.00)

## 2020-01-06 MED ORDER — SODIUM CHLORIDE 0.9 % IV SOLN
10.0000 mg | Freq: Once | INTRAVENOUS | Status: AC
  Administered 2020-01-06: 10 mg via INTRAVENOUS
  Filled 2020-01-06: qty 10

## 2020-01-06 MED ORDER — SODIUM CHLORIDE 0.9 % IV SOLN
75.0000 mg/m2 | Freq: Once | INTRAVENOUS | Status: AC
  Administered 2020-01-06: 180 mg via INTRAVENOUS
  Filled 2020-01-06: qty 18

## 2020-01-06 MED ORDER — HEPARIN SOD (PORK) LOCK FLUSH 100 UNIT/ML IV SOLN
INTRAVENOUS | Status: AC
  Filled 2020-01-06: qty 5

## 2020-01-06 MED ORDER — SODIUM CHLORIDE 0.9 % IV SOLN
INTRAVENOUS | Status: AC
  Filled 2020-01-06 (×2): qty 250

## 2020-01-06 MED ORDER — PEGFILGRASTIM 6 MG/0.6ML ~~LOC~~ PSKT
6.0000 mg | PREFILLED_SYRINGE | Freq: Once | SUBCUTANEOUS | Status: AC
  Administered 2020-01-06: 6 mg via SUBCUTANEOUS
  Filled 2020-01-06: qty 0.6

## 2020-01-06 MED ORDER — SODIUM CHLORIDE 0.9 % IV SOLN
Freq: Once | INTRAVENOUS | Status: AC
  Filled 2020-01-06: qty 250

## 2020-01-06 MED ORDER — SODIUM CHLORIDE 0.9 % IV SOLN
INTRAVENOUS | Status: AC
  Filled 2020-01-06: qty 250

## 2020-01-06 MED ORDER — SODIUM CHLORIDE 0.9% FLUSH
10.0000 mL | INTRAVENOUS | Status: DC | PRN
  Administered 2020-01-06: 10 mL via INTRAVENOUS
  Filled 2020-01-06: qty 10

## 2020-01-06 MED ORDER — HEPARIN SOD (PORK) LOCK FLUSH 100 UNIT/ML IV SOLN
500.0000 [IU] | Freq: Once | INTRAVENOUS | Status: AC
  Administered 2020-01-06: 500 [IU] via INTRAVENOUS
  Filled 2020-01-06: qty 5

## 2020-01-06 NOTE — Addendum Note (Signed)
Addended by: Jorene Minors on: 01/06/2020 09:38 AM   Modules accepted: Orders

## 2020-01-06 NOTE — Progress Notes (Signed)
Hematology/Oncology Consult note [facility]  Telephone:([phone] [phone]  Patient Care Team: Kirk Ruths, MD as PCP - General (Internal Medicine) Nickie Retort, MD as Consulting Physician (Urology) Lucilla Lame, MD as Consulting Physician (Gastroenterology) Lovell Sheehan, MD as Consulting Physician (Orthopedic Surgery) Sindy Guadeloupe, MD as Consulting Physician (Oncology)   Name of the patient: Dylan Ho  [id number]  1950/08/01   Date of visit: 01/06/20  Diagnosis- metastatic prostate cancer with bone metastases  Chief complaint/ Reason for visit-on treatment assessment prior to cycle 2 of docetaxel chemotherapy  Heme/Onc history: Patient is a 69 year old male with a past medical history significant for alcohol-related cirrhosis. He has not had any ascites or labs suggestive of chronic liver disease. He does not drink alcohol anymore. Patient was seen by scale for ongoing urinary symptoms of frequency especially at night. This was followed by a CT renal stone study which showed cyst in the upper pole of the right kidney as well as left kidney. S/p gastric bypass. Pelvic node enlargement in the right pelvis 11 mm. Bulky rounded lymph node along the external iliac chain 17 mm. Prostate was enlarged measuring 4.3 cm. Right obturator lymph node 1 cm. Legnet1 measuring 14 mm. Sclerosis of L3. Left hemisacrum with sclerotic lesion. Sclerosis of the superior pubic rami. Large area of sclerosis affecting the right hemisacrum 4.7 x 2.4 cm. Left ninth and 11th rib with sclerotic lesions compatible with bone metastases. Right hip sclerosis. PSA was elevated at 18.6.  Patient underwent 12 core prostate biopsy which showed adenocarcinoma Gleason score 9(4+5)grade group 5.  Patient got 2 doses of [place] will be switched to Lupron.  Docetaxel chemotherapy started on 12/16/2019.  Interval history-currently patient reports  doing well none none mild fatigue denies other complaints at this time  ECOG PS- 1 Pain scale- 0   Review of systems- Review of Systems  Constitutional: Positive for malaise/fatigue. Negative for chills, fever and weight loss.  HENT: Negative for congestion, ear discharge and nosebleeds.   Eyes: Negative for blurred vision.  Respiratory: Negative for cough, hemoptysis, sputum production, shortness of breath and wheezing.   Cardiovascular: Negative for chest pain, palpitations, orthopnea and claudication.  Gastrointestinal: Negative for abdominal pain, blood in stool, constipation, diarrhea, heartburn, melena, nausea and vomiting.  Genitourinary: Negative for dysuria, flank pain, frequency, hematuria and urgency.  Musculoskeletal: Negative for back pain, joint pain and myalgias.  Skin: Negative for rash.  Neurological: Negative for dizziness, tingling, focal weakness, seizures, weakness and headaches.  Endo/Heme/Allergies: Does not bruise/bleed easily.  Psychiatric/Behavioral: Negative for depression and suicidal ideas. The patient does not have insomnia.       No Known Allergies   Past Medical History:  Diagnosis Date  . Anemia   . Arthritis    knees, shoulders  . CAD (coronary artery disease)    No Stents Present- per patient  . Cirrhosis ([place])    mild  . Colon polyps   . Essential hypertension 08/24/2008  . GERD (gastroesophageal reflux disease)    patient denies  . History of alcohol abuse Last Drink- 2011  . History of gout   . Hypertension    no longer on meds  . Hypothyroidism   . Intestinal ulcer   . Iron deficiency anemia due to chronic blood loss 02/27/2017  . Other pancytopenia ([place]) 02/25/2017  . Overactive bladder   . Sleep apnea    improved since gastric bypass  . Thyroid disease  Past Surgical History:  Procedure Laterality Date  . BILATERAL TOTAL SHOULDER ARTHROPLASTY  1990's  . CIRCUMCISION REVISION N/A 03/13/2017   Procedure: CIRCUMCISION  REVISION;  Surgeon: Nickie Retort, MD;  Location: [facility];  Service: Urology;  Laterality: N/A;  . COLONOSCOPY WITH PROPOFOL N/A 10/30/2016   Procedure: COLONOSCOPY WITH PROPOFOL;  Surgeon: Lucilla Lame, MD;  Location: [facility];  Service: Endoscopy;  Laterality: N/A;  . ESOPHAGOGASTRODUODENOSCOPY (EGD) WITH PROPOFOL N/A 10/30/2016   Procedure: ESOPHAGOGASTRODUODENOSCOPY (EGD) WITH PROPOFOL;  Surgeon: Lucilla Lame, MD;  Location: [facility];  Service: Endoscopy;  Laterality: N/A;  . ESOPHAGOGASTRODUODENOSCOPY (EGD) WITH PROPOFOL N/A 03/26/2017   Procedure: ESOPHAGOGASTRODUODENOSCOPY (EGD) WITH PROPOFOL;  Surgeon: Lucilla Lame, MD;  Location: [place];  Service: Gastroenterology;  Laterality: N/A;  requests early  . GASTRIC BYPASS  2016   Dr. Darnell Level- [facility], [place] ARTHROSCOPY  1990s  . KNEE ARTHROSCOPY W/ MENISCAL REPAIR Bilateral   . KNEE ARTHROSCOPY WITH MEDIAL MENISECTOMY Left 04/21/2019   Procedure: KNEE ARTHROSCOPY WITH MEDIAL MENISECTOMY;  Surgeon: Lovell Sheehan, MD;  Location: [place];  Service: Orthopedics;  Laterality: Left;  . PORTA CATH INSERTION N/A 11/23/2019   Procedure: PORTA CATH INSERTION;  Surgeon: Algernon Huxley, MD;  Location: [place] [facility];  Service: Cardiovascular;  Laterality: N/A;  . TONSILLECTOMY  2004    Social History   Socioeconomic History  . Marital status: Married    Spouse name: Not on file  . Number of children: Not on file  . Years of education: Not on file  . Highest education level: Not on file  Occupational History  . Not on file  Tobacco Use  . Smoking status: Never Smoker  . Smokeless tobacco: Current User    Types: Chew  Vaping Use  . Vaping Use: Never used  Substance and Sexual Activity  . Alcohol use: No    Comment: Heavy ETOH in past- Last Drink 2011 per patient  . Drug use: No  . Sexual activity: Not on file  Other Topics Concern  . Not on file  Social History Narrative  . Not on file    Social Determinants of Health   Financial Resource Strain:   . Difficulty of Paying Living Expenses:   Food Insecurity:   . Worried About Charity fundraiser in the Last Year:   . Arboriculturist in the Last Year:   Transportation Needs:   . Film/video editor (Medical):   [name] Kitchen Lack of Transportation (Non-Medical):   Physical Activity:   . Days of Exercise per Week:   . Minutes of Exercise per Session:   Stress:   . Feeling of Stress :   Social Connections:   . Frequency of Communication with Friends and Family:   . Frequency of Social Gatherings with Friends and Family:   . Attends Religious Services:   . Active Member of Clubs or Organizations:   . Attends Archivist Meetings:   [name] Kitchen Marital Status:   Intimate Partner Violence:   . Fear of Current or Ex-Partner:   . Emotionally Abused:   [name] Kitchen Physically Abused:   . Sexually Abused:     Family History  Problem Relation Age of Onset  . Stroke Mother   . Breast cancer Mother   . Heart disease Father   . Stroke Father   . AAA (abdominal aortic aneurysm) Father   . Lung cancer Paternal Grandmother   . Prostate cancer Neg Hx   .  Bladder Cancer Neg Hx   . Kidney cancer Neg Hx      Current Outpatient Medications:  .  dexamethasone (DECADRON) 4 MG tablet, Take 2 tablets (8 mg total) by mouth 2 (two) times daily. Start the day before Taxotere. Then daily after chemo for 2 days., Disp: 30 tablet, Rfl: 1 .  lactulose (CHRONULAC) 10 GM/15ML solution, TAKE 15MLS TWICE A DAY, Disp: 950 mL, Rfl: 1 .  levothyroxine (SYNTHROID, LEVOTHROID) 150 MCG tablet, Take 1 tablet (150 mcg total) by mouth daily before breakfast., Disp: 90 tablet, Rfl: 1 .  lidocaine-prilocaine (EMLA) cream, Apply to affected area once, Disp: 30 g, Rfl: 3 .  LORazepam (ATIVAN) 0.5 MG tablet, Take 1 tablet (0.5 mg total) by mouth at bedtime., Disp: 30 tablet, Rfl: 0 .  Multiple Vitamin (MULTIVITAMIN) capsule, Take 1 capsule by mouth daily., Disp: , Rfl:   .  ondansetron (ZOFRAN) 8 MG tablet, Take 1 tablet (8 mg total) by mouth 2 (two) times daily as needed for refractory nausea / vomiting., Disp: 30 tablet, Rfl: 1 .  pantoprazole (PROTONIX) 40 MG tablet, Take 1 tablet (40 mg total) by mouth 2 (two) times daily., Disp: 180 tablet, Rfl: 3 .  prochlorperazine (COMPAZINE) 10 MG tablet, Take 1 tablet (10 mg total) by mouth every 6 (six) hours as needed (Nausea or vomiting)., Disp: 30 tablet, Rfl: 1 .  tamsulosin (FLOMAX) 0.4 MG CAPS capsule, Take 1 capsule (0.4 mg total) by mouth daily., Disp: 30 capsule, Rfl: 3 .  topiramate (TOPAMAX) 25 MG tablet, Take by mouth., Disp: , Rfl:  .  traZODone (DESYREL) 150 MG tablet, Take 4 tablets (600 mg total) by mouth at bedtime., Disp: 360 tablet, Rfl: 1 .  vitamin B-12 (CYANOCOBALAMIN) 500 MCG tablet, Take 1,000 mcg by mouth daily. , Disp: , Rfl:  No current facility-administered medications for this visit.  Facility-Administered Medications Ordered in Other Visits:  .  heparin lock flush 100 unit/mL, 500 Units, Intravenous, Once, Randa Evens C, MD .  sodium chloride flush (NS) 0.9 % injection 10 mL, 10 mL, Intravenous, PRN, Sindy Guadeloupe, MD, 10 mL at 01/06/20 0844  Physical exam:  Vitals:   01/06/20 0856  BP: 127/68  Pulse: (!) 57  Resp: 16  Temp: (!) 96.8 F (36 C)  TempSrc: Tympanic  SpO2: 99%  Weight: 239 lb (108.4 kg)  Height: 5\' 11"  (1.803 m)   Physical Exam Constitutional:      General: He is not in acute distress. Pulmonary:     Effort: Pulmonary effort is normal.  Skin:    General: Skin is warm and dry.  Neurological:     Mental Status: He is alert and oriented to person, place, and time.      CMP Latest Ref Rng & Units 12/27/2019  Glucose 70 - 99 mg/dL 99  BUN 8 - 23 mg/dL 10  Creatinine 0.61 - 1.24 mg/dL 1.19  Sodium 135 - 145 mmol/L 140  Potassium 3.5 - 5.1 mmol/L 4.0  Chloride 98 - 111 mmol/L 102  CO2 22 - 32 mmol/L 29  Calcium 8.9 - 10.3 mg/dL 8.6(L)  Total Protein 6.5  - 8.1 g/dL 6.6  Total Bilirubin 0.3 - 1.2 mg/dL 0.8  Alkaline Phos 38 - 126 U/L 281(H)  AST 15 - 41 U/L 29  ALT 0 - 44 U/L 20   CBC Latest Ref Rng & Units 01/06/2020  WBC 4.0 - 10.5 K/uL 3.5(L)  Hemoglobin 13.0 - 17.0 g/dL 12.3(L)  Hematocrit 39 -  52 % 35.4(L)  Platelets 150 - 400 K/uL 179     Assessment and plan- Patient is a 69 y.o. male with castrate sensitive metastatic prostate cancer.  He is here for on treatment assessment prior to cycle 2 of docetaxel chemotherapy  Patient has a mildly low white cell count of 3.5 but his ANC is 2.2.  Platelet counts are normal.  Hemoglobin stable between 12-14.  Counts are otherwise okay to proceed with cycle 2 of docetaxel chemotherapy today with on pro-Neulasta support.  Patient tends to get hypotensive for a few days after chemotherapy.  He will therefore be receiving 1 L of IV fluids today and come back to receive 1 more liter of IV fluids on 01/10/2020.  Patient will also return in 1 week's time to receive his first dose of Lupron.  He has so far received 2 doses of Firmagon  I will see him back in 3 weeks time with port labs CBC with differential, CMP and PSA for cycle 3 of docetaxel chemotherapy with on pro-Neulasta support.  PSA from today is pending but overall PSA is trending down.   Visit Diagnosis 1. Encounter for antineoplastic chemotherapy   2. Thrombocytopenia ([place])   3. Prostate cancer metastatic to bone [facility])      Dr. Randa Evens, MD, MPH [facility] [phone] 01/06/2020 9:14 AM

## 2020-01-10 ENCOUNTER — Inpatient Hospital Stay: Payer: Medicare HMO

## 2020-01-10 ENCOUNTER — Other Ambulatory Visit: Payer: Self-pay

## 2020-01-10 VITALS — BP 110/74 | HR 76 | Temp 99.0°F | Resp 18

## 2020-01-10 DIAGNOSIS — R519 Headache, unspecified: Secondary | ICD-10-CM

## 2020-01-10 DIAGNOSIS — Z95828 Presence of other vascular implants and grafts: Secondary | ICD-10-CM

## 2020-01-10 DIAGNOSIS — Z5111 Encounter for antineoplastic chemotherapy: Secondary | ICD-10-CM | POA: Diagnosis not present

## 2020-01-10 DIAGNOSIS — R531 Weakness: Secondary | ICD-10-CM

## 2020-01-10 MED ORDER — ACETAMINOPHEN 325 MG PO TABS
ORAL_TABLET | ORAL | Status: AC
  Filled 2020-01-10: qty 2

## 2020-01-10 MED ORDER — SODIUM CHLORIDE 0.9 % IV SOLN
Freq: Once | INTRAVENOUS | Status: AC
  Filled 2020-01-10: qty 250

## 2020-01-10 MED ORDER — SODIUM CHLORIDE 0.9% FLUSH
10.0000 mL | INTRAVENOUS | Status: DC | PRN
  Administered 2020-01-10: 10 mL via INTRAVENOUS
  Filled 2020-01-10: qty 10

## 2020-01-10 MED ORDER — HEPARIN SOD (PORK) LOCK FLUSH 100 UNIT/ML IV SOLN
500.0000 [IU] | Freq: Once | INTRAVENOUS | Status: AC
  Administered 2020-01-10: 500 [IU] via INTRAVENOUS
  Filled 2020-01-10: qty 5

## 2020-01-10 MED ORDER — ACETAMINOPHEN 325 MG PO TABS
650.0000 mg | ORAL_TABLET | Freq: Once | ORAL | Status: AC
  Administered 2020-01-10: 650 mg via ORAL

## 2020-01-13 ENCOUNTER — Other Ambulatory Visit: Payer: Self-pay

## 2020-01-13 ENCOUNTER — Inpatient Hospital Stay: Payer: Medicare HMO

## 2020-01-13 DIAGNOSIS — Z5111 Encounter for antineoplastic chemotherapy: Secondary | ICD-10-CM | POA: Diagnosis not present

## 2020-01-13 DIAGNOSIS — D500 Iron deficiency anemia secondary to blood loss (chronic): Secondary | ICD-10-CM

## 2020-01-13 MED ORDER — LEUPROLIDE ACETATE (3 MONTH) 22.5 MG ~~LOC~~ KIT
22.5000 mg | PACK | Freq: Once | SUBCUTANEOUS | Status: AC
  Administered 2020-01-13: 22.5 mg via SUBCUTANEOUS
  Filled 2020-01-13: qty 22.5

## 2020-01-27 ENCOUNTER — Encounter: Payer: Self-pay | Admitting: Oncology

## 2020-01-27 ENCOUNTER — Inpatient Hospital Stay: Payer: Medicare HMO

## 2020-01-27 ENCOUNTER — Other Ambulatory Visit: Payer: Self-pay

## 2020-01-27 ENCOUNTER — Inpatient Hospital Stay (HOSPITAL_BASED_OUTPATIENT_CLINIC_OR_DEPARTMENT_OTHER): Payer: Medicare HMO | Admitting: Oncology

## 2020-01-27 VITALS — BP 103/70 | HR 66 | Temp 96.4°F | Resp 16 | Wt 237.2 lb

## 2020-01-27 DIAGNOSIS — C7951 Secondary malignant neoplasm of bone: Secondary | ICD-10-CM | POA: Diagnosis not present

## 2020-01-27 DIAGNOSIS — Z5111 Encounter for antineoplastic chemotherapy: Secondary | ICD-10-CM | POA: Diagnosis not present

## 2020-01-27 DIAGNOSIS — Z95828 Presence of other vascular implants and grafts: Secondary | ICD-10-CM

## 2020-01-27 DIAGNOSIS — C61 Malignant neoplasm of prostate: Secondary | ICD-10-CM

## 2020-01-27 DIAGNOSIS — D696 Thrombocytopenia, unspecified: Secondary | ICD-10-CM

## 2020-01-27 LAB — CBC WITH DIFFERENTIAL/PLATELET
Abs Immature Granulocytes: 0.01 10*3/uL (ref 0.00–0.07)
Basophils Absolute: 0.1 10*3/uL (ref 0.0–0.1)
Basophils Relative: 2 %
Eosinophils Absolute: 0 10*3/uL (ref 0.0–0.5)
Eosinophils Relative: 0 %
HCT: 34.2 % — ABNORMAL LOW (ref 39.0–52.0)
Hemoglobin: 11.7 g/dL — ABNORMAL LOW (ref 13.0–17.0)
Immature Granulocytes: 0 %
Lymphocytes Relative: 21 %
Lymphs Abs: 0.6 10*3/uL — ABNORMAL LOW (ref 0.7–4.0)
MCH: 31.9 pg (ref 26.0–34.0)
MCHC: 34.2 g/dL (ref 30.0–36.0)
MCV: 93.2 fL (ref 80.0–100.0)
Monocytes Absolute: 0.3 10*3/uL (ref 0.1–1.0)
Monocytes Relative: 8 %
Neutro Abs: 2 10*3/uL (ref 1.7–7.7)
Neutrophils Relative %: 69 %
Platelets: 117 10*3/uL — ABNORMAL LOW (ref 150–400)
RBC: 3.67 MIL/uL — ABNORMAL LOW (ref 4.22–5.81)
RDW: 16.1 % — ABNORMAL HIGH (ref 11.5–15.5)
WBC: 3 10*3/uL — ABNORMAL LOW (ref 4.0–10.5)
nRBC: 0 % (ref 0.0–0.2)

## 2020-01-27 LAB — COMPREHENSIVE METABOLIC PANEL
ALT: 12 U/L (ref 0–44)
AST: 21 U/L (ref 15–41)
Albumin: 3.8 g/dL (ref 3.5–5.0)
Alkaline Phosphatase: 103 U/L (ref 38–126)
Anion gap: 7 (ref 5–15)
BUN: 15 mg/dL (ref 8–23)
CO2: 28 mmol/L (ref 22–32)
Calcium: 8.5 mg/dL — ABNORMAL LOW (ref 8.9–10.3)
Chloride: 103 mmol/L (ref 98–111)
Creatinine, Ser: 1.03 mg/dL (ref 0.61–1.24)
GFR calc Af Amer: >60 mL/min (ref >60)
GFR calc non Af Amer: >60 mL/min (ref >60)
Glucose, Bld: 96 mg/dL (ref 70–99)
Potassium: 4.3 mmol/L (ref 3.5–5.1)
Sodium: 138 mmol/L (ref 135–145)
Total Bilirubin: 0.7 mg/dL (ref 0.3–1.2)
Total Protein: 5.9 g/dL — ABNORMAL LOW (ref 6.5–8.1)

## 2020-01-27 LAB — PSA: Prostatic Specific Antigen: 1.51 ng/mL (ref 0.00–4.00)

## 2020-01-27 MED ORDER — SODIUM CHLORIDE 0.9 % IV SOLN
Freq: Once | INTRAVENOUS | Status: AC
  Filled 2020-01-27: qty 250

## 2020-01-27 MED ORDER — SODIUM CHLORIDE 0.9 % IV SOLN
75.0000 mg/m2 | Freq: Once | INTRAVENOUS | Status: AC
  Administered 2020-01-27: 180 mg via INTRAVENOUS
  Filled 2020-01-27: qty 18

## 2020-01-27 MED ORDER — HEPARIN SOD (PORK) LOCK FLUSH 100 UNIT/ML IV SOLN
INTRAVENOUS | Status: AC
  Filled 2020-01-27: qty 5

## 2020-01-27 MED ORDER — SODIUM CHLORIDE 0.9 % IV SOLN
10.0000 mg | Freq: Once | INTRAVENOUS | Status: AC
  Administered 2020-01-27: 10 mg via INTRAVENOUS
  Filled 2020-01-27: qty 10

## 2020-01-27 MED ORDER — SODIUM CHLORIDE 0.9 % IV SOLN
Freq: Once | INTRAVENOUS | Status: AC
  Administered 2020-01-27: 1000 mL via INTRAVENOUS
  Filled 2020-01-27: qty 250

## 2020-01-27 MED ORDER — SODIUM CHLORIDE 0.9% FLUSH
10.0000 mL | INTRAVENOUS | Status: DC | PRN
  Administered 2020-01-27: 10 mL via INTRAVENOUS
  Filled 2020-01-27: qty 10

## 2020-01-27 MED ORDER — HEPARIN SOD (PORK) LOCK FLUSH 100 UNIT/ML IV SOLN
500.0000 [IU] | Freq: Once | INTRAVENOUS | Status: AC | PRN
  Administered 2020-01-27: 500 [IU]
  Filled 2020-01-27: qty 5

## 2020-01-27 MED ORDER — PEGFILGRASTIM 6 MG/0.6ML ~~LOC~~ PSKT
6.0000 mg | PREFILLED_SYRINGE | Freq: Once | SUBCUTANEOUS | Status: AC
  Administered 2020-01-27: 6 mg via SUBCUTANEOUS
  Filled 2020-01-27: qty 0.6

## 2020-01-27 NOTE — Progress Notes (Signed)
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 He has not had any ascites or labs suggestive of chronic liver disease. He does not drink alcohol anymore. Patient was seen by scale for ongoing urinary symptoms of frequency especially at night. This was followed by a CT renal stone study which showed cyst in the upper pole of the right kidney as well as left kidney. S/p gastric bypass. Pelvic node enlargement in the right pelvis 11 mm. Bulky rounded lymph node along the external iliac chain 17 mm. Prostate was enlarged measuring 4.3 cm. Right obturator lymph node 1 cm. Legnet1 measuring 14 mm. Sclerosis of L3. Left hemisacrum with sclerotic lesion. Sclerosis of the superior pubic rami. Large area of sclerosis affecting the right hemisacrum 4.7 x 2.4 cm. Left ninth and 11th rib with sclerotic lesions compatible with bone metastases. Right hip sclerosis. PSA was elevated at 18.6.  Patient underwent 12 core prostate biopsy which showed adenocarcinoma Gleason score 9(4+5)grade group 5.  Patient got 2 doses of Mills Koller will be switched to Lupron.  Docetaxel chemotherapy started on 12/16/2019.  Interval history-patient is tolerating  chemotherapy relatively well.  He has not been taking his outpatient medications including thyroxine and reports having fatigue.  He does feel better when he receives IV fluids on day 1 and day 4 of chemotherapy.  ECOG PS- 1 Pain scale- 0   Review of systems- Review of Systems  Constitutional: Positive for malaise/fatigue. Negative for chills, fever and weight loss.  HENT: Negative for congestion, ear discharge and nosebleeds.   Eyes: Negative for blurred vision.  Respiratory: Negative for cough, hemoptysis, sputum production, shortness of breath and wheezing.   Cardiovascular: Negative for chest pain, palpitations, orthopnea and claudication.  Gastrointestinal: Negative for abdominal pain, blood in stool, constipation, diarrhea, heartburn, melena, nausea and vomiting.  Genitourinary: Negative for dysuria, flank pain, frequency, hematuria and urgency.  Musculoskeletal: Negative for back pain, joint pain and myalgias.  Skin: Negative for rash.  Neurological: Negative for dizziness, tingling, focal weakness, seizures, weakness and headaches.  Endo/Heme/Allergies: Does not bruise/bleed easily.  Psychiatric/Behavioral: Negative for depression and suicidal ideas. The patient does not have insomnia.       No Known Allergies   Past Medical History:  Diagnosis Date  . Anemia   . Arthritis    knees, shoulders  . CAD (coronary artery disease)    No Stents Present- per patient  . Cirrhosis ([place])    mild  . Colon polyps   . Essential hypertension 08/24/2008  . GERD (gastroesophageal reflux disease)    patient denies  . History of alcohol abuse Last Drink- 2011  . History of gout   . Hypertension    no longer on meds  . Hypothyroidism   . Intestinal ulcer   . Iron deficiency anemia due to chronic blood loss 02/27/2017  . Other pancytopenia (  [place]) 02/25/2017  . Overactive bladder   . Sleep apnea    improved since gastric bypass  . Thyroid disease      Past Surgical History:    Procedure Laterality Date  . BILATERAL TOTAL SHOULDER ARTHROPLASTY  1990's  . CIRCUMCISION REVISION N/A 03/13/2017   Procedure: CIRCUMCISION REVISION;  Surgeon: Nickie Retort, MD;  Location: [facility];  Service: Urology;  Laterality: N/A;  . COLONOSCOPY WITH PROPOFOL N/A 10/30/2016   Procedure: COLONOSCOPY WITH PROPOFOL;  Surgeon: Lucilla Lame, MD;  Location: [facility];  Service: Endoscopy;  Laterality: N/A;  . ESOPHAGOGASTRODUODENOSCOPY (EGD) WITH PROPOFOL N/A 10/30/2016   Procedure: ESOPHAGOGASTRODUODENOSCOPY (EGD) WITH PROPOFOL;  Surgeon: Lucilla Lame, MD;  Location: [facility];  Service: Endoscopy;  Laterality: N/A;  . ESOPHAGOGASTRODUODENOSCOPY (EGD) WITH PROPOFOL N/A 03/26/2017   Procedure: ESOPHAGOGASTRODUODENOSCOPY (EGD) WITH PROPOFOL;  Surgeon: Lucilla Lame, MD;  Location: [place];  Service: Gastroenterology;  Laterality: N/A;  requests early  . GASTRIC BYPASS  2016   Dr. Darnell Level- [facility], [place] ARTHROSCOPY  1990s  . KNEE ARTHROSCOPY W/ MENISCAL REPAIR Bilateral   . KNEE ARTHROSCOPY WITH MEDIAL MENISECTOMY Left 04/21/2019   Procedure: KNEE ARTHROSCOPY WITH MEDIAL MENISECTOMY;  Surgeon: Lovell Sheehan, MD;  Location: [place];  Service: Orthopedics;  Laterality: Left;  . PORTA CATH INSERTION N/A 11/23/2019   Procedure: PORTA CATH INSERTION;  Surgeon: Algernon Huxley, MD;  Location: [place] [facility];  Service: Cardiovascular;  Laterality: N/A;  . TONSILLECTOMY  2004    Social History   Socioeconomic History  . Marital status: Married    Spouse name: Not on file  . Number of children: Not on file  . Years of education: Not on file  . Highest education level: Not on file  Occupational History  . Not on file  Tobacco Use  . Smoking status: Never Smoker  . Smokeless tobacco: Current User    Types: Chew  Vaping Use  . Vaping Use: Never used  Substance and Sexual Activity  . Alcohol use: No    Comment: Heavy ETOH in past- Last Drink 2011 per  patient  . Drug use: No  . Sexual activity: Not on file  Other Topics Concern  . Not on file  Social History Narrative  . Not on file   Social Determinants of Health   Financial Resource Strain:   . Difficulty of Paying Living Expenses:   Food Insecurity:   . Worried About Charity fundraiser in the Last Year:   . Arboriculturist in the Last Year:   Transportation Needs:   . Film/video editor (Medical):   [name] Kitchen Lack of Transportation (Non-Medical):   Physical Activity:   . Days of Exercise per Week:   . Minutes of Exercise per Session:   Stress:   . Feeling of Stress :   Social Connections:   . Frequency of Communication with Friends and Family:   . Frequency of Social Gatherings with Friends and Family:   . Attends Religious Services:   . Active Member of Clubs or Organizations:   . Attends Archivist Meetings:   [name] Kitchen Marital Status:   Intimate Partner Violence:   . Fear of Current or Ex-Partner:   . Emotionally Abused:   [name] Kitchen Physically Abused:   . Sexually Abused:     Family History  Problem Relation Age of Onset  . Stroke Mother   . Breast cancer Mother   . Heart disease  Father   . Stroke Father   . AAA (abdominal aortic aneurysm) Father   . Lung cancer Paternal Grandmother   . Prostate cancer Neg Hx   . Bladder Cancer Neg Hx   . Kidney cancer Neg Hx      Current Outpatient Medications:  .  dexamethasone (DECADRON) 4 MG tablet, Take 2 tablets (8 mg total) by mouth 2 (two) times daily. Start the day before Taxotere. Then daily after chemo for 2 days., Disp: 30 tablet, Rfl: 1 .  lactulose (CHRONULAC) 10 GM/15ML solution, TAKE 15MLS TWICE A DAY, Disp: 950 mL, Rfl: 1 .  levothyroxine (SYNTHROID, LEVOTHROID) 150 MCG tablet, Take 1 tablet (150 mcg total) by mouth daily before breakfast., Disp: 90 tablet, Rfl: 1 .  lidocaine-prilocaine (EMLA) cream, Apply to affected area once, Disp: 30 g, Rfl: 3 .  LORazepam (ATIVAN) 0.5 MG tablet, Take 1 tablet (0.5 mg  total) by mouth at bedtime., Disp: 30 tablet, Rfl: 0 .  Multiple Vitamin (MULTIVITAMIN) capsule, Take 1 capsule by mouth daily., Disp: , Rfl:  .  ondansetron (ZOFRAN) 8 MG tablet, Take 1 tablet (8 mg total) by mouth 2 (two) times daily as needed for refractory nausea / vomiting., Disp: 30 tablet, Rfl: 1 .  pantoprazole (PROTONIX) 40 MG tablet, Take 1 tablet (40 mg total) by mouth 2 (two) times daily., Disp: 180 tablet, Rfl: 3 .  prochlorperazine (COMPAZINE) 10 MG tablet, Take 1 tablet (10 mg total) by mouth every 6 (six) hours as needed (Nausea or vomiting)., Disp: 30 tablet, Rfl: 1 .  tamsulosin (FLOMAX) 0.4 MG CAPS capsule, Take 1 capsule (0.4 mg total) by mouth daily., Disp: 30 capsule, Rfl: 3 .  topiramate (TOPAMAX) 25 MG tablet, Take by mouth., Disp: , Rfl:  .  traZODone (DESYREL) 150 MG tablet, Take 4 tablets (600 mg total) by mouth at bedtime., Disp: 360 tablet, Rfl: 1 .  vitamin B-12 (CYANOCOBALAMIN) 500 MCG tablet, Take 1,000 mcg by mouth daily. , Disp: , Rfl:   Physical exam:  Vitals:   01/27/20 0848  BP: 103/70  Pulse: 66  Resp: 16  Temp: (!) 96.4 F (35.8 C)  TempSrc: Tympanic  SpO2: 100%  Weight: (!) 237 lb 3.2 oz (107.6 kg)   Physical Exam Pulmonary:     Effort: Pulmonary effort is normal.  Skin:    General: Skin is warm and dry.  Neurological:     Mental Status: He is alert and oriented to person, place, and time.      CMP Latest Ref Rng & Units 01/27/2020  Glucose 70 - 99 mg/dL 96  BUN 8 - 23 mg/dL 15  Creatinine 0.61 - 1.24 mg/dL 1.03  Sodium 135 - 145 mmol/L 138  Potassium 3.5 - 5.1 mmol/L 4.3  Chloride 98 - 111 mmol/L 103  CO2 22 - 32 mmol/L 28  Calcium 8.9 - 10.3 mg/dL 8.5(L)  Total Protein 6.5 - 8.1 g/dL 5.9(L)  Total Bilirubin 0.3 - 1.2 mg/dL 0.7  Alkaline Phos 38 - 126 U/L 103  AST 15 - 41 U/L 21  ALT 0 - 44 U/L 12   CBC Latest Ref Rng & Units 01/27/2020  WBC 4.0 - 10.5 K/uL 3.0(L)  Hemoglobin 13.0 - 17.0 g/dL 11.7(L)  Hematocrit 39 - 52 %  34.2(L)  Platelets 150 - 400 K/uL 117(L)     Assessment and plan- Patient is a 69 y.o. male with castrate sensitive metastatic prostate cancer.   He is here for on treatment assessment prior to  cycle 3 of docetaxel chemotherapy  Counts okay to proceed with cycle 3 of docetaxel chemotherapy today.  He has mild thrombocytopenia which is chronic.  Mild leukopenia but ANC is 2.2 today.  He will receive on pro Neulasta as well.  1 L of IV fluids today and on 01/30/2020.  I will give him 10 mg of IV Decadron along with fluids on 01/30/2020.  Again strongly recommended the patient should not stop taking his outpatient medications especially levothyroxine.  I will see him back in 3 weeks with CBC with differential, CMP for cycle 4 of docetaxel chemotherapy with on pro-Neulasta support.  He will be due for his next dose of Lupron in October 2021   Visit Diagnosis 1. Encounter for antineoplastic chemotherapy   2. Prostate cancer metastatic to bone ([place])   3. Thrombocytopenia ([place])      Dr. Randa Evens, MD, MPH [facility] at [facility] [phone] 01/27/2020 8:48 AM

## 2020-01-30 ENCOUNTER — Other Ambulatory Visit: Payer: Self-pay

## 2020-01-30 ENCOUNTER — Inpatient Hospital Stay: Payer: Medicare HMO

## 2020-01-30 ENCOUNTER — Encounter: Payer: Self-pay | Admitting: *Deleted

## 2020-01-30 VITALS — BP 116/70 | HR 78 | Temp 97.9°F | Resp 16

## 2020-01-30 DIAGNOSIS — Z5111 Encounter for antineoplastic chemotherapy: Secondary | ICD-10-CM | POA: Diagnosis not present

## 2020-01-30 DIAGNOSIS — R531 Weakness: Secondary | ICD-10-CM

## 2020-01-30 DIAGNOSIS — C7951 Secondary malignant neoplasm of bone: Secondary | ICD-10-CM

## 2020-01-30 DIAGNOSIS — C61 Malignant neoplasm of prostate: Secondary | ICD-10-CM

## 2020-01-30 MED ORDER — SODIUM CHLORIDE 0.9 % IV SOLN
10.0000 mg | Freq: Once | INTRAVENOUS | Status: AC
  Administered 2020-01-30: 10 mg via INTRAVENOUS
  Filled 2020-01-30: qty 10

## 2020-01-30 MED ORDER — HEPARIN SOD (PORK) LOCK FLUSH 100 UNIT/ML IV SOLN
500.0000 [IU] | Freq: Once | INTRAVENOUS | Status: AC
  Administered 2020-01-30: 500 [IU] via INTRAVENOUS
  Filled 2020-01-30: qty 5

## 2020-01-30 MED ORDER — SODIUM CHLORIDE 0.9 % IV SOLN
Freq: Once | INTRAVENOUS | Status: AC
  Filled 2020-01-30: qty 250

## 2020-01-30 MED ORDER — HEPARIN SOD (PORK) LOCK FLUSH 100 UNIT/ML IV SOLN
INTRAVENOUS | Status: AC
  Filled 2020-01-30: qty 5

## 2020-02-17 ENCOUNTER — Inpatient Hospital Stay: Payer: Medicare HMO | Attending: Oncology

## 2020-02-17 ENCOUNTER — Inpatient Hospital Stay: Payer: Medicare HMO

## 2020-02-17 ENCOUNTER — Encounter: Payer: Self-pay | Admitting: Oncology

## 2020-02-17 ENCOUNTER — Inpatient Hospital Stay (HOSPITAL_BASED_OUTPATIENT_CLINIC_OR_DEPARTMENT_OTHER): Payer: Medicare HMO | Admitting: Oncology

## 2020-02-17 ENCOUNTER — Other Ambulatory Visit: Payer: Self-pay

## 2020-02-17 ENCOUNTER — Other Ambulatory Visit: Payer: Self-pay | Admitting: *Deleted

## 2020-02-17 VITALS — BP 161/92 | HR 66 | Resp 18

## 2020-02-17 VITALS — BP 95/68 | HR 72 | Temp 97.5°F | Resp 20 | Wt 233.7 lb

## 2020-02-17 DIAGNOSIS — R5383 Other fatigue: Secondary | ICD-10-CM | POA: Insufficient documentation

## 2020-02-17 DIAGNOSIS — C7951 Secondary malignant neoplasm of bone: Secondary | ICD-10-CM | POA: Diagnosis not present

## 2020-02-17 DIAGNOSIS — C61 Malignant neoplasm of prostate: Secondary | ICD-10-CM | POA: Insufficient documentation

## 2020-02-17 DIAGNOSIS — M1990 Unspecified osteoarthritis, unspecified site: Secondary | ICD-10-CM | POA: Diagnosis not present

## 2020-02-17 DIAGNOSIS — R5381 Other malaise: Secondary | ICD-10-CM | POA: Insufficient documentation

## 2020-02-17 DIAGNOSIS — K7030 Alcoholic cirrhosis of liver without ascites: Secondary | ICD-10-CM | POA: Diagnosis not present

## 2020-02-17 DIAGNOSIS — I2510 Atherosclerotic heart disease of native coronary artery without angina pectoris: Secondary | ICD-10-CM | POA: Diagnosis not present

## 2020-02-17 DIAGNOSIS — Z5189 Encounter for other specified aftercare: Secondary | ICD-10-CM | POA: Diagnosis not present

## 2020-02-17 DIAGNOSIS — Z79899 Other long term (current) drug therapy: Secondary | ICD-10-CM | POA: Insufficient documentation

## 2020-02-17 DIAGNOSIS — D696 Thrombocytopenia, unspecified: Secondary | ICD-10-CM

## 2020-02-17 DIAGNOSIS — R634 Abnormal weight loss: Secondary | ICD-10-CM | POA: Insufficient documentation

## 2020-02-17 DIAGNOSIS — Z5111 Encounter for antineoplastic chemotherapy: Secondary | ICD-10-CM

## 2020-02-17 DIAGNOSIS — K219 Gastro-esophageal reflux disease without esophagitis: Secondary | ICD-10-CM | POA: Diagnosis not present

## 2020-02-17 DIAGNOSIS — I10 Essential (primary) hypertension: Secondary | ICD-10-CM | POA: Diagnosis not present

## 2020-02-17 DIAGNOSIS — Z7952 Long term (current) use of systemic steroids: Secondary | ICD-10-CM | POA: Diagnosis not present

## 2020-02-17 DIAGNOSIS — R21 Rash and other nonspecific skin eruption: Secondary | ICD-10-CM | POA: Insufficient documentation

## 2020-02-17 LAB — COMPREHENSIVE METABOLIC PANEL
ALT: 10 U/L (ref 0–44)
AST: 18 U/L (ref 15–41)
Albumin: 3.4 g/dL — ABNORMAL LOW (ref 3.5–5.0)
Alkaline Phosphatase: 80 U/L (ref 38–126)
Anion gap: 8 (ref 5–15)
BUN: 14 mg/dL (ref 8–23)
CO2: 27 mmol/L (ref 22–32)
Calcium: 8.5 mg/dL — ABNORMAL LOW (ref 8.9–10.3)
Chloride: 103 mmol/L (ref 98–111)
Creatinine, Ser: 1.08 mg/dL (ref 0.61–1.24)
GFR calc Af Amer: >60 mL/min (ref >60)
GFR calc non Af Amer: >60 mL/min (ref >60)
Glucose, Bld: 98 mg/dL (ref 70–99)
Potassium: 4.1 mmol/L (ref 3.5–5.1)
Sodium: 138 mmol/L (ref 135–145)
Total Bilirubin: 0.6 mg/dL (ref 0.3–1.2)
Total Protein: 5.7 g/dL — ABNORMAL LOW (ref 6.5–8.1)

## 2020-02-17 LAB — CBC WITH DIFFERENTIAL/PLATELET
Abs Immature Granulocytes: 0.01 10*3/uL (ref 0.00–0.07)
Basophils Absolute: 0.1 10*3/uL (ref 0.0–0.1)
Basophils Relative: 2 %
Eosinophils Absolute: 0 10*3/uL (ref 0.0–0.5)
Eosinophils Relative: 0 %
HCT: 32.1 % — ABNORMAL LOW (ref 39.0–52.0)
Hemoglobin: 11.1 g/dL — ABNORMAL LOW (ref 13.0–17.0)
Immature Granulocytes: 0 %
Lymphocytes Relative: 15 %
Lymphs Abs: 0.5 10*3/uL — ABNORMAL LOW (ref 0.7–4.0)
MCH: 32.5 pg (ref 26.0–34.0)
MCHC: 34.6 g/dL (ref 30.0–36.0)
MCV: 93.9 fL (ref 80.0–100.0)
Monocytes Absolute: 0.3 10*3/uL (ref 0.1–1.0)
Monocytes Relative: 9 %
Neutro Abs: 2.6 10*3/uL (ref 1.7–7.7)
Neutrophils Relative %: 74 %
Platelets: 112 10*3/uL — ABNORMAL LOW (ref 150–400)
RBC: 3.42 MIL/uL — ABNORMAL LOW (ref 4.22–5.81)
RDW: 15.9 % — ABNORMAL HIGH (ref 11.5–15.5)
WBC: 3.5 10*3/uL — ABNORMAL LOW (ref 4.0–10.5)
nRBC: 0 % (ref 0.0–0.2)

## 2020-02-17 MED ORDER — SODIUM CHLORIDE 0.9 % IV SOLN
Freq: Once | INTRAVENOUS | Status: AC
  Filled 2020-02-17: qty 250

## 2020-02-17 MED ORDER — SODIUM CHLORIDE 0.9 % IV SOLN
10.0000 mg | Freq: Once | INTRAVENOUS | Status: AC
  Administered 2020-02-17: 10 mg via INTRAVENOUS
  Filled 2020-02-17: qty 10

## 2020-02-17 MED ORDER — PEGFILGRASTIM 6 MG/0.6ML ~~LOC~~ PSKT
6.0000 mg | PREFILLED_SYRINGE | Freq: Once | SUBCUTANEOUS | Status: AC
  Administered 2020-02-17: 6 mg via SUBCUTANEOUS
  Filled 2020-02-17: qty 0.6

## 2020-02-17 MED ORDER — TRIAMCINOLONE ACETONIDE 0.5 % EX CREA: 1.0000 "application " | TOPICAL_CREAM | Freq: Three times a day (TID) | CUTANEOUS | 0 refills | Status: DC

## 2020-02-17 MED ORDER — SODIUM CHLORIDE 0.9 % IV SOLN
75.0000 mg/m2 | Freq: Once | INTRAVENOUS | Status: AC
  Administered 2020-02-17: 180 mg via INTRAVENOUS
  Filled 2020-02-17: qty 18

## 2020-02-17 MED ORDER — HEPARIN SOD (PORK) LOCK FLUSH 100 UNIT/ML IV SOLN
INTRAVENOUS | Status: AC
  Filled 2020-02-17: qty 5

## 2020-02-17 MED ORDER — SODIUM CHLORIDE 0.9% FLUSH
10.0000 mL | INTRAVENOUS | Status: DC | PRN
  Filled 2020-02-17: qty 10

## 2020-02-17 MED ORDER — HEPARIN SOD (PORK) LOCK FLUSH 100 UNIT/ML IV SOLN
500.0000 [IU] | Freq: Once | INTRAVENOUS | Status: AC | PRN
  Administered 2020-02-17: 500 [IU]
  Filled 2020-02-17: qty 5

## 2020-02-17 NOTE — Progress Notes (Signed)
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 He has not had any ascites or labs suggestive of chronic liver disease. He does not drink alcohol anymore. Patient was seen by scale for ongoing urinary symptoms of frequency especially at night. This was followed by a CT renal stone study which showed cyst in the upper pole of the right kidney as well as left kidney. S/p gastric bypass. Pelvic node enlargement in the right pelvis 11 mm. Bulky rounded lymph node along the external iliac chain 17 mm. Prostate was enlarged measuring 4.3 cm. Right obturator lymph node 1 cm. Legnet1 measuring 14 mm. Sclerosis of L3. Left hemisacrum with sclerotic lesion. Sclerosis of the superior pubic rami. Large area of sclerosis affecting the right hemisacrum 4.7 x 2.4 cm. Left ninth and 11th rib with sclerotic lesions compatible with bone metastases. Right hip sclerosis. PSA was elevated at 18.6.  Patient underwent 12 core prostate biopsy which showed adenocarcinoma Gleason score 9(4+5)grade group 5.  Patient got 2 doses of Mills Koller will be switched to Lupron. Docetaxel chemotherapy started on 12/16/2019.  Interval  history-patient reports having exfoliating rash over his bilateral forearms and scalp that started after his last chemotherapy.  Today reports that it is getting better on its own.  Denies any prolonged sun exposure.  Reports that food does not taste good and he feels full easily.  ECOG PS- 1 Pain scale- 0   Review of systems- Review of Systems  Constitutional: Positive for malaise/fatigue and weight loss. Negative for chills and fever.  HENT: Negative for congestion, ear discharge and nosebleeds.   Eyes: Negative for blurred vision.  Respiratory: Negative for cough, hemoptysis, sputum production, shortness of breath and wheezing.   Cardiovascular: Negative for chest pain, palpitations, orthopnea and claudication.  Gastrointestinal: Negative for abdominal pain, blood in stool, constipation, diarrhea, heartburn, melena, nausea and vomiting.  Genitourinary: Negative for dysuria, flank pain, frequency, hematuria and urgency.  Musculoskeletal: Negative for back pain, joint pain and myalgias.  Skin: Positive for rash.  Neurological: Negative for dizziness, tingling, focal weakness, seizures, weakness and headaches.  Endo/Heme/Allergies: Does not bruise/bleed easily.  Psychiatric/Behavioral: Negative for depression and suicidal ideas. The patient does not have insomnia.      No Known Allergies   Past Medical History:  Diagnosis Date  . Anemia   . Arthritis    knees, shoulders  . CAD (coronary artery disease)    No Stents Present- per patient  . Cirrhosis ([place])    mild  . Colon polyps   . Essential hypertension 08/24/2008  . GERD (gastroesophageal reflux disease)    patient denies  . History of alcohol abuse Last Drink- 2011  . History of gout   . Hypertension    no longer on meds  . Hypothyroidism   . Intestinal ulcer   . Iron deficiency anemia due  to chronic blood loss 02/27/2017  . Other pancytopenia ([place]) 02/25/2017  . Overactive bladder   . Sleep apnea    improved since  gastric bypass  . Thyroid disease      Past Surgical History:  Procedure Laterality Date  . BILATERAL TOTAL SHOULDER ARTHROPLASTY  1990's  . CIRCUMCISION REVISION N/A 03/13/2017   Procedure: CIRCUMCISION REVISION;  Surgeon: Nickie Retort, MD;  Location: [facility];  Service: Urology;  Laterality: N/A;  . COLONOSCOPY WITH PROPOFOL N/A 10/30/2016   Procedure: COLONOSCOPY WITH PROPOFOL;  Surgeon: Lucilla Lame, MD;  Location: [facility];  Service: Endoscopy;  Laterality: N/A;  . ESOPHAGOGASTRODUODENOSCOPY (EGD) WITH PROPOFOL N/A 10/30/2016   Procedure: ESOPHAGOGASTRODUODENOSCOPY (EGD) WITH PROPOFOL;  Surgeon: Lucilla Lame, MD;  Location: [facility];  Service: Endoscopy;  Laterality: N/A;  . ESOPHAGOGASTRODUODENOSCOPY (EGD) WITH PROPOFOL N/A 03/26/2017   Procedure: ESOPHAGOGASTRODUODENOSCOPY (EGD) WITH PROPOFOL;  Surgeon: Lucilla Lame, MD;  Location: [place];  Service: Gastroenterology;  Laterality: N/A;  requests early  . GASTRIC BYPASS  2016   Dr. Darnell Level- [facility], [place] ARTHROSCOPY  1990s  . KNEE ARTHROSCOPY W/ MENISCAL REPAIR Bilateral   . KNEE ARTHROSCOPY WITH MEDIAL MENISECTOMY Left 04/21/2019   Procedure: KNEE ARTHROSCOPY WITH MEDIAL MENISECTOMY;  Surgeon: Lovell Sheehan, MD;  Location: [place];  Service: Orthopedics;  Laterality: Left;  . PORTA CATH INSERTION N/A 11/23/2019   Procedure: PORTA CATH INSERTION;  Surgeon: Algernon Huxley, MD;  Location: [place] [facility];  Service: Cardiovascular;  Laterality: N/A;  . TONSILLECTOMY  2004    Social History   Socioeconomic History  . Marital status: Married    Spouse name: Not on file  . Number of children: Not on file  . Years of education: Not on file  . Highest education level: Not on file  Occupational History  . Not on file  Tobacco Use  . Smoking status: Never Smoker  . Smokeless tobacco: Current User    Types: Chew  Vaping Use  . Vaping Use: Never used  Substance and Sexual Activity  .  Alcohol use: No    Comment: Heavy ETOH in past- Last Drink 2011 per patient  . Drug use: No  . Sexual activity: Not on file  Other Topics Concern  . Not on file  Social History Narrative  . Not on file   Social Determinants of Health   Financial Resource Strain:   . Difficulty of Paying Living Expenses:   Food Insecurity:   . Worried About Charity fundraiser in the Last Year:   . Arboriculturist in the Last Year:   Transportation Needs:   . Film/video editor (Medical):   [name] Kitchen Lack of Transportation (Non-Medical):   Physical Activity:   . Days of Exercise per Week:   . Minutes of Exercise per Session:   Stress:   . Feeling of Stress :   Social Connections:   . Frequency of Communication with Friends and Family:   . Frequency of Social Gatherings with Friends and Family:   . Attends Religious Services:   . Active Member of Clubs or Organizations:   . Attends Archivist Meetings:   [name] Kitchen Marital Status:   Intimate Partner Violence:   . Fear of Current or Ex-Partner:   . Emotionally Abused:   [name] Kitchen Physically Abused:   . Sexually Abused:     Family History  Problem Relation Age of Onset  . Stroke Mother   .  Breast cancer Mother   . Heart disease Father   . Stroke Father   . AAA (abdominal aortic aneurysm) Father   . Lung cancer Paternal Grandmother   . Prostate cancer Neg Hx   . Bladder Cancer Neg Hx   . Kidney cancer Neg Hx      Current Outpatient Medications:  .  dexamethasone (DECADRON) 4 MG tablet, Take 2 tablets (8 mg total) by mouth 2 (two) times daily. Start the day before Taxotere. Then daily after chemo for 2 days. (Patient not taking: Reported on 01/27/2020), Disp: 30 tablet, Rfl: 1 .  lactulose (CHRONULAC) 10 GM/15ML solution, TAKE 15MLS TWICE A DAY (Patient not taking: Reported on 01/27/2020), Disp: 950 mL, Rfl: 1 .  levothyroxine (SYNTHROID, LEVOTHROID) 150 MCG tablet, Take 1 tablet (150 mcg total) by mouth daily before breakfast. (Patient not  taking: Reported on 01/27/2020), Disp: 90 tablet, Rfl: 1 .  lidocaine-prilocaine (EMLA) cream, Apply to affected area once (Patient not taking: Reported on 01/27/2020), Disp: 30 g, Rfl: 3 .  LORazepam (ATIVAN) 0.5 MG tablet, Take 1 tablet (0.5 mg total) by mouth at bedtime. (Patient not taking: Reported on 01/27/2020), Disp: 30 tablet, Rfl: 0 .  Multiple Vitamin (MULTIVITAMIN) capsule, Take 1 capsule by mouth daily. (Patient not taking: Reported on 01/27/2020), Disp: , Rfl:  .  ondansetron (ZOFRAN) 8 MG tablet, Take 1 tablet (8 mg total) by mouth 2 (two) times daily as needed for refractory nausea / vomiting. (Patient not taking: Reported on 01/27/2020), Disp: 30 tablet, Rfl: 1 .  pantoprazole (PROTONIX) 40 MG tablet, Take 1 tablet (40 mg total) by mouth 2 (two) times daily. (Patient not taking: Reported on 01/27/2020), Disp: 180 tablet, Rfl: 3 .  prochlorperazine (COMPAZINE) 10 MG tablet, Take 1 tablet (10 mg total) by mouth every 6 (six) hours as needed (Nausea or vomiting). (Patient not taking: Reported on 01/27/2020), Disp: 30 tablet, Rfl: 1 .  tamsulosin (FLOMAX) 0.4 MG CAPS capsule, Take 1 capsule (0.4 mg total) by mouth daily. (Patient not taking: Reported on 01/27/2020), Disp: 30 capsule, Rfl: 3 .  topiramate (TOPAMAX) 25 MG tablet, Take by mouth. (Patient not taking: Reported on 01/27/2020), Disp: , Rfl:  .  traZODone (DESYREL) 150 MG tablet, Take 4 tablets (600 mg total) by mouth at bedtime. (Patient not taking: Reported on 01/27/2020), Disp: 360 tablet, Rfl: 1 .  vitamin B-12 (CYANOCOBALAMIN) 500 MCG tablet, Take 1,000 mcg by mouth daily.  (Patient not taking: Reported on 01/27/2020), Disp: , Rfl:   Physical exam:  Vitals:   02/17/20 0902  BP: 95/68  Pulse: 72  Resp: 20  Temp: (!) 97.5 F (36.4 C)  SpO2: 97%  Weight: 233 lb 11.2 oz (106 kg)   Physical Exam Cardiovascular:     Rate and Rhythm: Normal rate and regular rhythm.     Heart sounds: Normal heart sounds.  Pulmonary:     Effort:  Pulmonary effort is normal.     Breath sounds: Normal breath sounds.  Abdominal:     General: Bowel sounds are normal.     Palpations: Abdomen is soft.  Skin:    Comments: Exfoliating rash with residual hyperpigmentation noted over bilateral forearms as well as scalp.  Neurological:     Mental Status: He is alert and oriented to person, place, and time.      CMP Latest Ref Rng & Units 01/27/2020  Glucose 70 - 99 mg/dL 96  BUN 8 - 23 mg/dL 15  Creatinine 0.61 - 1.24 mg/dL  1.03  Sodium 135 - 145 mmol/L 138  Potassium 3.5 - 5.1 mmol/L 4.3  Chloride 98 - 111 mmol/L 103  CO2 22 - 32 mmol/L 28  Calcium 8.9 - 10.3 mg/dL 8.5(L)  Total Protein 6.5 - 8.1 g/dL 5.9(L)  Total Bilirubin 0.3 - 1.2 mg/dL 0.7  Alkaline Phos 38 - 126 U/L 103  AST 15 - 41 U/L 21  ALT 0 - 44 U/L 12   CBC Latest Ref Rng & Units 02/17/2020  WBC 4.0 - 10.5 K/uL 3.5(L)  Hemoglobin 13.0 - 17.0 g/dL 11.1(L)  Hematocrit 39 - 52 % 32.1(L)  Platelets 150 - 400 K/uL 112(L)     Assessment and plan- Patient is a 69 y.o. male with castrate sensitive metastatic prostate cancer.    He is here for on treatment assessment prior to cycle four of docetaxel chemotherapy  Counts okay to proceed with cycle four of docetaxel chemotherapy today.  He will receive IV fluids today and in 2 days time.  I will see him back in 3 weeks for cycle five.  Plan is to finish six cycles followed by repeat scans.  He disease on pro-Neulasta along with chemotherapy as well.  Patient will be due for his next Lupron shot in early October 2021.  Skin rash: We will send him a prescription for topical steroids which she will only apply to areas which appear inflamed.  Otherwise he will continue to use Eucerin or Vaseline to keep his skin moisturized and I have advised him to avoid sun exposure as much as possible.  If he has a flareup of his rash after this cycle of chemotherapy I will consider giving him a tapering course of steroids   Visit  Diagnosis 1. Encounter for antineoplastic chemotherapy   2. Prostate cancer metastatic to bone ([place])   3. Thrombocytopenia ([place])      Dr. Randa Evens, MD, MPH [facility] at [facility] [phone] 02/17/2020 1:00 PM

## 2020-02-17 NOTE — Progress Notes (Signed)
Patient states he is having issues with skin rashes/ skin peel on arms, face, and head. Patient states it feels like a really bad sunburn.

## 2020-02-20 ENCOUNTER — Other Ambulatory Visit: Payer: Self-pay

## 2020-02-20 ENCOUNTER — Inpatient Hospital Stay: Payer: Medicare HMO

## 2020-02-20 VITALS — BP 109/63 | HR 85 | Temp 98.2°F | Resp 18

## 2020-02-20 DIAGNOSIS — C7951 Secondary malignant neoplasm of bone: Secondary | ICD-10-CM

## 2020-02-20 DIAGNOSIS — D500 Iron deficiency anemia secondary to blood loss (chronic): Secondary | ICD-10-CM

## 2020-02-20 DIAGNOSIS — Z95828 Presence of other vascular implants and grafts: Secondary | ICD-10-CM

## 2020-02-20 DIAGNOSIS — R531 Weakness: Secondary | ICD-10-CM

## 2020-02-20 DIAGNOSIS — C61 Malignant neoplasm of prostate: Secondary | ICD-10-CM

## 2020-02-20 DIAGNOSIS — Z5111 Encounter for antineoplastic chemotherapy: Secondary | ICD-10-CM | POA: Diagnosis not present

## 2020-02-20 MED ORDER — SODIUM CHLORIDE 0.9% FLUSH
10.0000 mL | Freq: Once | INTRAVENOUS | Status: DC | PRN
  Filled 2020-02-20: qty 10

## 2020-02-20 MED ORDER — SODIUM CHLORIDE 0.9 % IV SOLN
Freq: Once | INTRAVENOUS | Status: AC
  Filled 2020-02-20: qty 250

## 2020-02-20 MED ORDER — DEXAMETHASONE SODIUM PHOSPHATE 10 MG/ML IJ SOLN
INTRAMUSCULAR | Status: AC
  Filled 2020-02-20: qty 1

## 2020-02-20 MED ORDER — HEPARIN SOD (PORK) LOCK FLUSH 100 UNIT/ML IV SOLN
500.0000 [IU] | Freq: Once | INTRAVENOUS | Status: DC
  Filled 2020-02-20: qty 5

## 2020-02-20 MED ORDER — SODIUM CHLORIDE 0.9 % IV SOLN
10.0000 mg | Freq: Once | INTRAVENOUS | Status: AC
  Administered 2020-02-20: 10 mg via INTRAVENOUS
  Filled 2020-02-20: qty 10

## 2020-02-20 MED ORDER — HEPARIN SOD (PORK) LOCK FLUSH 100 UNIT/ML IV SOLN
INTRAVENOUS | Status: AC
  Filled 2020-02-20: qty 5

## 2020-02-20 MED ORDER — HEPARIN SOD (PORK) LOCK FLUSH 100 UNIT/ML IV SOLN
500.0000 [IU] | Freq: Once | INTRAVENOUS | Status: AC
  Administered 2020-02-20: 500 [IU] via INTRAVENOUS
  Filled 2020-02-20: qty 5

## 2020-02-20 MED ORDER — SODIUM CHLORIDE 0.9% FLUSH
10.0000 mL | Freq: Once | INTRAVENOUS | Status: DC
  Filled 2020-02-20: qty 10

## 2020-02-20 MED ORDER — SODIUM CHLORIDE 0.9% FLUSH
10.0000 mL | Freq: Once | INTRAVENOUS | Status: AC
  Administered 2020-02-20: 10 mL
  Filled 2020-02-20: qty 10

## 2020-03-09 ENCOUNTER — Encounter: Payer: Self-pay | Admitting: Oncology

## 2020-03-09 ENCOUNTER — Other Ambulatory Visit: Payer: Self-pay

## 2020-03-09 ENCOUNTER — Inpatient Hospital Stay: Payer: Medicare HMO

## 2020-03-09 ENCOUNTER — Inpatient Hospital Stay (HOSPITAL_BASED_OUTPATIENT_CLINIC_OR_DEPARTMENT_OTHER): Payer: Medicare HMO | Admitting: Oncology

## 2020-03-09 ENCOUNTER — Inpatient Hospital Stay: Payer: Medicare HMO | Attending: Oncology

## 2020-03-09 VITALS — BP 90/59 | HR 79 | Temp 97.3°F | Resp 18 | Ht 71.0 in | Wt 227.5 lb

## 2020-03-09 VITALS — BP 140/65 | HR 60 | Resp 18

## 2020-03-09 DIAGNOSIS — C7951 Secondary malignant neoplasm of bone: Secondary | ICD-10-CM | POA: Diagnosis not present

## 2020-03-09 DIAGNOSIS — I10 Essential (primary) hypertension: Secondary | ICD-10-CM | POA: Diagnosis not present

## 2020-03-09 DIAGNOSIS — Z7952 Long term (current) use of systemic steroids: Secondary | ICD-10-CM | POA: Diagnosis not present

## 2020-03-09 DIAGNOSIS — Z79899 Other long term (current) drug therapy: Secondary | ICD-10-CM | POA: Diagnosis not present

## 2020-03-09 DIAGNOSIS — E860 Dehydration: Secondary | ICD-10-CM | POA: Diagnosis not present

## 2020-03-09 DIAGNOSIS — C61 Malignant neoplasm of prostate: Secondary | ICD-10-CM | POA: Insufficient documentation

## 2020-03-09 DIAGNOSIS — Z803 Family history of malignant neoplasm of breast: Secondary | ICD-10-CM | POA: Insufficient documentation

## 2020-03-09 DIAGNOSIS — Z5111 Encounter for antineoplastic chemotherapy: Secondary | ICD-10-CM

## 2020-03-09 DIAGNOSIS — K219 Gastro-esophageal reflux disease without esophagitis: Secondary | ICD-10-CM | POA: Insufficient documentation

## 2020-03-09 DIAGNOSIS — D696 Thrombocytopenia, unspecified: Secondary | ICD-10-CM

## 2020-03-09 DIAGNOSIS — I2510 Atherosclerotic heart disease of native coronary artery without angina pectoris: Secondary | ICD-10-CM | POA: Diagnosis not present

## 2020-03-09 DIAGNOSIS — R21 Rash and other nonspecific skin eruption: Secondary | ICD-10-CM | POA: Insufficient documentation

## 2020-03-09 DIAGNOSIS — R5381 Other malaise: Secondary | ICD-10-CM | POA: Diagnosis not present

## 2020-03-09 DIAGNOSIS — E039 Hypothyroidism, unspecified: Secondary | ICD-10-CM | POA: Diagnosis not present

## 2020-03-09 DIAGNOSIS — K7030 Alcoholic cirrhosis of liver without ascites: Secondary | ICD-10-CM | POA: Diagnosis not present

## 2020-03-09 DIAGNOSIS — R5383 Other fatigue: Secondary | ICD-10-CM | POA: Diagnosis not present

## 2020-03-09 DIAGNOSIS — M1990 Unspecified osteoarthritis, unspecified site: Secondary | ICD-10-CM | POA: Insufficient documentation

## 2020-03-09 DIAGNOSIS — Z801 Family history of malignant neoplasm of trachea, bronchus and lung: Secondary | ICD-10-CM | POA: Diagnosis not present

## 2020-03-09 DIAGNOSIS — Z5189 Encounter for other specified aftercare: Secondary | ICD-10-CM | POA: Insufficient documentation

## 2020-03-09 LAB — CBC WITH DIFFERENTIAL/PLATELET
Abs Immature Granulocytes: 0.01 10*3/uL (ref 0.00–0.07)
Basophils Absolute: 0.1 10*3/uL (ref 0.0–0.1)
Basophils Relative: 3 %
Eosinophils Absolute: 0 10*3/uL (ref 0.0–0.5)
Eosinophils Relative: 1 %
HCT: 34 % — ABNORMAL LOW (ref 39.0–52.0)
Hemoglobin: 11.6 g/dL — ABNORMAL LOW (ref 13.0–17.0)
Immature Granulocytes: 0 %
Lymphocytes Relative: 22 %
Lymphs Abs: 0.6 10*3/uL — ABNORMAL LOW (ref 0.7–4.0)
MCH: 32.4 pg (ref 26.0–34.0)
MCHC: 34.1 g/dL (ref 30.0–36.0)
MCV: 95 fL (ref 80.0–100.0)
Monocytes Absolute: 0.3 10*3/uL (ref 0.1–1.0)
Monocytes Relative: 9 %
Neutro Abs: 1.8 10*3/uL (ref 1.7–7.7)
Neutrophils Relative %: 65 %
Platelets: 111 10*3/uL — ABNORMAL LOW (ref 150–400)
RBC: 3.58 MIL/uL — ABNORMAL LOW (ref 4.22–5.81)
RDW: 15.2 % (ref 11.5–15.5)
WBC: 2.8 10*3/uL — ABNORMAL LOW (ref 4.0–10.5)
nRBC: 0 % (ref 0.0–0.2)

## 2020-03-09 LAB — COMPREHENSIVE METABOLIC PANEL
ALT: 10 U/L (ref 0–44)
AST: 19 U/L (ref 15–41)
Albumin: 3.5 g/dL (ref 3.5–5.0)
Alkaline Phosphatase: 61 U/L (ref 38–126)
Anion gap: 8 (ref 5–15)
BUN: 12 mg/dL (ref 8–23)
CO2: 26 mmol/L (ref 22–32)
Calcium: 8.4 mg/dL — ABNORMAL LOW (ref 8.9–10.3)
Chloride: 103 mmol/L (ref 98–111)
Creatinine, Ser: 0.99 mg/dL (ref 0.61–1.24)
GFR calc Af Amer: >60 mL/min (ref >60)
GFR calc non Af Amer: >60 mL/min (ref >60)
Glucose, Bld: 92 mg/dL (ref 70–99)
Potassium: 4.1 mmol/L (ref 3.5–5.1)
Sodium: 137 mmol/L (ref 135–145)
Total Bilirubin: 0.8 mg/dL (ref 0.3–1.2)
Total Protein: 5.4 g/dL — ABNORMAL LOW (ref 6.5–8.1)

## 2020-03-09 LAB — PSA: Prostatic Specific Antigen: 1.06 ng/mL (ref 0.00–4.00)

## 2020-03-09 MED ORDER — HEPARIN SOD (PORK) LOCK FLUSH 100 UNIT/ML IV SOLN
500.0000 [IU] | Freq: Once | INTRAVENOUS | Status: AC
  Administered 2020-03-09: 500 [IU] via INTRAVENOUS
  Filled 2020-03-09: qty 5

## 2020-03-09 MED ORDER — HEPARIN SOD (PORK) LOCK FLUSH 100 UNIT/ML IV SOLN
INTRAVENOUS | Status: AC
  Filled 2020-03-09: qty 5

## 2020-03-09 MED ORDER — LORAZEPAM 0.5 MG PO TABS
0.5000 mg | ORAL_TABLET | Freq: Every day | ORAL | 0 refills | Status: DC
Start: 2020-03-09 — End: 2020-04-16

## 2020-03-09 MED ORDER — SODIUM CHLORIDE 0.9% FLUSH
10.0000 mL | INTRAVENOUS | Status: DC | PRN
  Administered 2020-03-09: 10 mL via INTRAVENOUS
  Filled 2020-03-09: qty 10

## 2020-03-09 MED ORDER — SODIUM CHLORIDE 0.9 % IV SOLN
75.0000 mg/m2 | Freq: Once | INTRAVENOUS | Status: AC
  Administered 2020-03-09: 180 mg via INTRAVENOUS
  Filled 2020-03-09: qty 18

## 2020-03-09 MED ORDER — PEGFILGRASTIM 6 MG/0.6ML ~~LOC~~ PSKT
6.0000 mg | PREFILLED_SYRINGE | Freq: Once | SUBCUTANEOUS | Status: AC
  Administered 2020-03-09: 6 mg via SUBCUTANEOUS
  Filled 2020-03-09: qty 0.6

## 2020-03-09 MED ORDER — SODIUM CHLORIDE 0.9 % IV SOLN
Freq: Once | INTRAVENOUS | Status: AC
  Administered 2020-03-09: 1000 mL via INTRAVENOUS
  Filled 2020-03-09: qty 250

## 2020-03-09 MED ORDER — SODIUM CHLORIDE 0.9 % IV SOLN
INTRAVENOUS | Status: DC
  Filled 2020-03-09: qty 250

## 2020-03-09 MED ORDER — SODIUM CHLORIDE 0.9 % IV SOLN
10.0000 mg | Freq: Once | INTRAVENOUS | Status: AC
  Administered 2020-03-09: 10 mg via INTRAVENOUS
  Filled 2020-03-09: qty 10

## 2020-03-09 MED ORDER — SODIUM CHLORIDE 0.9 % IV SOLN
Freq: Once | INTRAVENOUS | Status: AC
  Filled 2020-03-09: qty 250

## 2020-03-09 NOTE — Progress Notes (Signed)
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 He has not had any ascites or labs suggestive of chronic liver disease. He does not drink alcohol anymore. Patient was seen by scale for ongoing urinary symptoms of frequency especially at night. This was followed by a CT renal stone study which showed cyst in the upper pole of the right kidney as well as left kidney. S/p gastric bypass. Pelvic node enlargement in the right pelvis 11 mm. Bulky rounded lymph node along the external iliac chain 17 mm. Prostate was enlarged measuring 4.3 cm. Right obturator lymph node 1 cm. Legnet1 measuring 14 mm. Sclerosis of L3. Left hemisacrum with sclerotic lesion. Sclerosis of the superior pubic rami. Large area of sclerosis affecting the right hemisacrum 4.7 x 2.4 cm. Left ninth and 11th rib with sclerotic lesions compatible with bone metastases. Right hip sclerosis. PSA was elevated at 18.6.  Patient underwent 12 core prostate biopsy which showed adenocarcinoma Gleason score 9(4+5)grade group 5.  Patient got 2 doses of [place] Koller will be switched to Lupron. Docetaxel chemotherapy started on 12/16/2019.   Interval  history-exfoliating rash which patient developed over his bilateral forearms continues to evolve but he has not developed any new areas.  Scalp rash is also getting better.  Reports that his vision is blurry and he often has tearing in his eyes.  Reports ongoing fatigue.  Denies any tingling numbness in his hands and feet.  ECOG PS- 1 Pain scale- 0 Opioid associated constipation- no  Review of systems- Review of Systems  Constitutional: Positive for malaise/fatigue. Negative for chills, fever and weight loss.  HENT: Negative for congestion, ear discharge and nosebleeds.   Eyes: Negative for blurred vision.  Respiratory: Negative for cough, hemoptysis, sputum production, shortness of breath and wheezing.   Cardiovascular: Negative for chest pain, palpitations, orthopnea and claudication.  Gastrointestinal: Negative for abdominal pain, blood in stool, constipation, diarrhea, heartburn, melena, nausea and vomiting.  Genitourinary: Negative for dysuria, flank pain, frequency, hematuria and urgency.  Musculoskeletal: Negative for back pain, joint pain and myalgias.  Skin: Positive for rash.  Neurological: Negative for dizziness, tingling, focal weakness, seizures, weakness and headaches.  Endo/Heme/Allergies: Does not bruise/bleed easily.  Psychiatric/Behavioral: Negative for depression and suicidal ideas. The patient does not have insomnia.      No Known Allergies   Past Medical History:  Diagnosis Date   Anemia    Arthritis    knees, shoulders   CAD (coronary artery disease)    No Stents Present- per patient   Cirrhosis ([place])    mild   Colon polyps    Essential hypertension 08/24/2008   GERD (gastroesophageal reflux disease)    patient denies   History of alcohol abuse Last Drink- 2011   History of gout    Hypertension    no longer on meds   Hypothyroidism  Intestinal ulcer    Iron deficiency anemia due to chronic blood loss 02/27/2017   Other pancytopenia ([place])  02/25/2017   Overactive bladder    Sleep apnea    improved since gastric bypass   Thyroid disease      Past Surgical History:  Procedure Laterality Date   BILATERAL TOTAL SHOULDER ARTHROPLASTY  1990's   CIRCUMCISION REVISION N/A 03/13/2017   Procedure: CIRCUMCISION REVISION;  Surgeon: Nickie Retort, MD;  Location: [facility];  Service: Urology;  Laterality: N/A;   COLONOSCOPY WITH PROPOFOL N/A 10/30/2016   Procedure: COLONOSCOPY WITH PROPOFOL;  Surgeon: Lucilla Lame, MD;  Location: [facility];  Service: Endoscopy;  Laterality: N/A;   ESOPHAGOGASTRODUODENOSCOPY (EGD) WITH PROPOFOL N/A 10/30/2016   Procedure: ESOPHAGOGASTRODUODENOSCOPY (EGD) WITH PROPOFOL;  Surgeon: Lucilla Lame, MD;  Location: [facility];  Service: Endoscopy;  Laterality: N/A;   ESOPHAGOGASTRODUODENOSCOPY (EGD) WITH PROPOFOL N/A 03/26/2017   Procedure: ESOPHAGOGASTRODUODENOSCOPY (EGD) WITH PROPOFOL;  Surgeon: Lucilla Lame, MD;  Location: [place];  Service: Gastroenterology;  Laterality: N/A;  requests early   GASTRIC BYPASS  2016   Dr. Darnell Level- [facility], [place]   KNEE ARTHROSCOPY  1990s   KNEE ARTHROSCOPY W/ MENISCAL REPAIR Bilateral    KNEE ARTHROSCOPY WITH MEDIAL MENISECTOMY Left 04/21/2019   Procedure: KNEE ARTHROSCOPY WITH MEDIAL MENISECTOMY;  Surgeon: Lovell Sheehan, MD;  Location: [place];  Service: Orthopedics;  Laterality: Left;   PORTA CATH INSERTION N/A 11/23/2019   Procedure: PORTA CATH INSERTION;  Surgeon: Algernon Huxley, MD;  Location: [place] [facility];  Service: Cardiovascular;  Laterality: N/A;   TONSILLECTOMY  2004    Social History   Socioeconomic History   Marital status: Married    Spouse name: Not on file   Number of children: Not on file   Years of education: Not on file   Highest education level: Not on file  Occupational History   Not on file  Tobacco Use   Smoking status: Never Smoker   Smokeless tobacco: Current User    Types: Chew  Vaping  Use   Vaping Use: Never used  Substance and Sexual Activity   Alcohol use: No    Comment: Heavy ETOH in past- Last Drink 2011 per patient   Drug use: No   Sexual activity: Not on file  Other Topics Concern   Not on file  Social History Narrative   Not on file   Social Determinants of Health   Financial Resource Strain:    Difficulty of Paying Living Expenses: Not on file  Food Insecurity:    Worried About [place] in the Last Year: Not on file   Ran Out of Food in the Last Year: Not on file  Transportation Needs:    Lack of Transportation (Medical): Not on file   Lack of Transportation (Non-Medical): Not on file  Physical Activity:    Days of Exercise per Week: Not on file   Minutes of Exercise per Session: Not on file  Stress:    Feeling of Stress : Not on file  Social Connections:    Frequency of Communication with Friends and Family: Not on file   Frequency of Social Gatherings with Friends and Family: Not on file   Attends Religious Services: Not on file   Active Member of Clubs or Organizations: Not on file   Attends Archivist Meetings: Not on file   Marital Status: Not on file  Intimate Partner Violence:    Fear of  Current or Ex-Partner: Not on file   Emotionally Abused: Not on file   Physically Abused: Not on file   Sexually Abused: Not on file    Family History  Problem Relation Age of Onset   Stroke Mother    Breast cancer Mother    Heart disease Father    Stroke Father    AAA (abdominal aortic aneurysm) Father    Lung cancer Paternal Grandmother    Prostate cancer Neg Hx    Bladder Cancer Neg Hx    Kidney cancer Neg Hx      Current Outpatient Medications:    levothyroxine (SYNTHROID, LEVOTHROID) 150 MCG tablet, Take 1 tablet (150 mcg total) by mouth daily before breakfast., Disp: 90 tablet, Rfl: 1   lidocaine-prilocaine (EMLA) cream, Apply to affected area once, Disp: 30 g, Rfl: 3   LORazepam  (ATIVAN) 0.5 MG tablet, Take 1 tablet (0.5 mg total) by mouth at bedtime., Disp: 30 tablet, Rfl: 0   dexamethasone (DECADRON) 4 MG tablet, Take 2 tablets (8 mg total) by mouth 2 (two) times daily. Start the day before Taxotere. Then daily after chemo for 2 days. (Patient not taking: Reported on 03/09/2020), Disp: 30 tablet, Rfl: 1   lactulose (CHRONULAC) 10 GM/15ML solution, TAKE 15MLS TWICE A DAY (Patient not taking: Reported on 03/09/2020), Disp: 950 mL, Rfl: 1   Multiple Vitamin (MULTIVITAMIN) capsule, Take 1 capsule by mouth daily. (Patient not taking: Reported on 01/27/2020), Disp: , Rfl:    ondansetron (ZOFRAN) 8 MG tablet, Take 1 tablet (8 mg total) by mouth 2 (two) times daily as needed for refractory nausea / vomiting. (Patient not taking: Reported on 01/27/2020), Disp: 30 tablet, Rfl: 1   pantoprazole (PROTONIX) 40 MG tablet, Take 1 tablet (40 mg total) by mouth 2 (two) times daily. (Patient not taking: Reported on 01/27/2020), Disp: 180 tablet, Rfl: 3   prochlorperazine (COMPAZINE) 10 MG tablet, Take 1 tablet (10 mg total) by mouth every 6 (six) hours as needed (Nausea or vomiting). (Patient not taking: Reported on 01/27/2020), Disp: 30 tablet, Rfl: 1   tamsulosin (FLOMAX) 0.4 MG CAPS capsule, Take 1 capsule (0.4 mg total) by mouth daily. (Patient not taking: Reported on 01/27/2020), Disp: 30 capsule, Rfl: 3   topiramate (TOPAMAX) 25 MG tablet, Take by mouth. (Patient not taking: Reported on 01/27/2020), Disp: , Rfl:    traZODone (DESYREL) 150 MG tablet, Take 4 tablets (600 mg total) by mouth at bedtime. (Patient not taking: Reported on 01/27/2020), Disp: 360 tablet, Rfl: 1   triamcinolone cream (KENALOG) 0.5 %, Apply 1 application topically 3 (three) times daily. (Patient not taking: Reported on 03/09/2020), Disp: 30 g, Rfl: 0   vitamin B-12 (CYANOCOBALAMIN) 500 MCG tablet, Take 1,000 mcg by mouth daily.  (Patient not taking: Reported on 01/27/2020), Disp: , Rfl:  No current  facility-administered medications for this visit.  Facility-Administered Medications Ordered in Other Visits:    DOCEtaxel (TAXOTERE) 180 mg in sodium chloride 0.9 % 250 mL chemo infusion, 75 mg/m2 (Treatment Plan Recorded), Intravenous, Once, Sindy Guadeloupe, MD, Last Rate: 268 mL/hr at 03/09/20 1110, 180 mg at 03/09/20 1110   heparin lock flush 100 unit/mL, 500 Units, Intravenous, Once, Sindy Guadeloupe, MD   pegfilgrastim (NEULASTA ONPRO KIT) injection 6 mg, 6 mg, Subcutaneous, Once, Sindy Guadeloupe, MD   sodium chloride flush (NS) 0.9 % injection 10 mL, 10 mL, Intravenous, PRN, Sindy Guadeloupe, MD, 10 mL at 03/09/20 0839  Physical exam:  Vitals:   03/09/20  0902  BP: (!) 90/59  Pulse: 79  Resp: 18  Temp: (!) 97.3 F (36.3 C)  TempSrc: Tympanic  SpO2: 97%  Weight: 227 lb 8 oz (103.2 kg)  Height: _0  (1.803 m)   Physical Exam Constitutional:      General: He is not in acute distress. Cardiovascular:     Rate and Rhythm: Normal rate and regular rhythm.     Heart sounds: Normal heart sounds.  Pulmonary:     Effort: Pulmonary effort is normal.     Breath sounds: Normal breath sounds.  Abdominal:     General: Bowel sounds are normal.     Palpations: Abdomen is soft.  Musculoskeletal:     Cervical back: Normal range of motion.  Skin:    General: Skin is warm and dry.     Comments: Exfoliating rash noted over bilateral forearms appears to be healing.  No active inflammatory areas seen.  Neurological:     Mental Status: He is alert and oriented to person, place, and time.      CMP Latest Ref Rng & Units 03/09/2020  Glucose 70 - 99 mg/dL 92  BUN 8 - 23 mg/dL 12  Creatinine 0.61 - 1.24 mg/dL 0.99  Sodium 135 - 145 mmol/L 137  Potassium 3.5 - 5.1 mmol/L 4.1  Chloride 98 - 111 mmol/L 103  CO2 22 - 32 mmol/L 26  Calcium 8.9 - 10.3 mg/dL 8.4(L)  Total Protein 6.5 - 8.1 g/dL 5.4(L)  Total Bilirubin 0.3 - 1.2 mg/dL 0.8  Alkaline Phos 38 - 126 U/L 61  AST 15 - 41 U/L 19  ALT  0 - 44 U/L 10   CBC Latest Ref Rng & Units 03/09/2020  WBC 4.0 - 10.5 K/uL 2.8(L)  Hemoglobin 13.0 - 17.0 g/dL 11.6(L)  Hematocrit 39 - 52 % 34.0(L)  Platelets 150 - 400 K/uL 111(L)      Assessment and plan- Patient is a 69 y.o. male with castrate sensitive metastatic prostate cancer.He is here for on treatment assessment prior to cycle 5 of docetaxel chemotherapy  White cell count is 2.8 today but ANC is greater than one.  He has baseline thrombocytopenia with a platelet count and is 100s.  Counts are otherwise okay to proceed with cycle 5 of docetaxel chemotherapy today.  He will also receive on pro Neulasta support.  I will see him back in 3 weeks with CBC with differential, CMP and PSA for cycle 6 which will be his last cycle for now.  Patient will be due for his next Lupron shot in early October 2021.  Skin rash: Likely photosensitivity rash as it was seen in sun exposed areas of her bilateral forearms and scalp.  He will keep those areas moisturized with Eucerin or Vaseline.  He is also wearing full sleeved clothes and a hat to prevent further irritation.  His systolic blood pressure is 90 today and he will receive 1 L of IV fluids today and then again on Tuesday, 03/13/2020.  I will renew his Ativan today for anxiety   Visit Diagnosis 1. Prostate cancer metastatic to bone ([place])   2. Encounter for antineoplastic chemotherapy   3. Thrombocytopenia ([place])   4. Dehydration   5. Skin rash      Dr. Randa Evens, MD, MPH [facility] [phone] 03/09/2020 11:51 AM

## 2020-03-09 NOTE — Progress Notes (Signed)
Patient c/o increase SOB and blood under fingernail. The patient also reports dizziness

## 2020-03-13 ENCOUNTER — Other Ambulatory Visit: Payer: Self-pay

## 2020-03-13 ENCOUNTER — Inpatient Hospital Stay: Payer: Medicare HMO

## 2020-03-13 ENCOUNTER — Ambulatory Visit: Payer: Medicare HMO

## 2020-03-13 VITALS — BP 101/63 | HR 85 | Temp 98.8°F | Resp 18

## 2020-03-13 DIAGNOSIS — C61 Malignant neoplasm of prostate: Secondary | ICD-10-CM

## 2020-03-13 DIAGNOSIS — Z5111 Encounter for antineoplastic chemotherapy: Secondary | ICD-10-CM | POA: Diagnosis not present

## 2020-03-13 DIAGNOSIS — D500 Iron deficiency anemia secondary to blood loss (chronic): Secondary | ICD-10-CM

## 2020-03-13 DIAGNOSIS — C7951 Secondary malignant neoplasm of bone: Secondary | ICD-10-CM

## 2020-03-13 MED ORDER — HEPARIN SOD (PORK) LOCK FLUSH 100 UNIT/ML IV SOLN
500.0000 [IU] | Freq: Once | INTRAVENOUS | Status: AC | PRN
  Administered 2020-03-13: 500 [IU]
  Filled 2020-03-13: qty 5

## 2020-03-13 MED ORDER — SODIUM CHLORIDE 0.9% FLUSH
10.0000 mL | Freq: Once | INTRAVENOUS | Status: AC | PRN
  Administered 2020-03-13: 10 mL
  Filled 2020-03-13: qty 10

## 2020-03-13 MED ORDER — SODIUM CHLORIDE 0.9 % IV SOLN
Freq: Once | INTRAVENOUS | Status: AC
  Filled 2020-03-13: qty 250

## 2020-03-13 MED ORDER — SODIUM CHLORIDE 0.9 % IV SOLN
10.0000 mg | Freq: Once | INTRAVENOUS | Status: AC
  Administered 2020-03-13: 10 mg via INTRAVENOUS
  Filled 2020-03-13: qty 10

## 2020-03-13 MED ORDER — HEPARIN SOD (PORK) LOCK FLUSH 100 UNIT/ML IV SOLN
INTRAVENOUS | Status: AC
  Filled 2020-03-13: qty 5

## 2020-03-30 ENCOUNTER — Inpatient Hospital Stay: Payer: Medicare HMO

## 2020-03-30 ENCOUNTER — Inpatient Hospital Stay (HOSPITAL_BASED_OUTPATIENT_CLINIC_OR_DEPARTMENT_OTHER): Payer: Medicare HMO | Admitting: Oncology

## 2020-03-30 ENCOUNTER — Other Ambulatory Visit: Payer: Self-pay

## 2020-03-30 VITALS — BP 97/63 | HR 84 | Temp 96.6°F | Resp 16 | Wt 226.9 lb

## 2020-03-30 DIAGNOSIS — D696 Thrombocytopenia, unspecified: Secondary | ICD-10-CM

## 2020-03-30 DIAGNOSIS — C61 Malignant neoplasm of prostate: Secondary | ICD-10-CM

## 2020-03-30 DIAGNOSIS — Z5111 Encounter for antineoplastic chemotherapy: Secondary | ICD-10-CM | POA: Diagnosis not present

## 2020-03-30 DIAGNOSIS — D649 Anemia, unspecified: Secondary | ICD-10-CM | POA: Diagnosis not present

## 2020-03-30 DIAGNOSIS — C7951 Secondary malignant neoplasm of bone: Secondary | ICD-10-CM

## 2020-03-30 LAB — COMPREHENSIVE METABOLIC PANEL
ALT: 11 U/L (ref 0–44)
AST: 23 U/L (ref 15–41)
Albumin: 3.3 g/dL — ABNORMAL LOW (ref 3.5–5.0)
Alkaline Phosphatase: 55 U/L (ref 38–126)
Anion gap: 8 (ref 5–15)
BUN: 13 mg/dL (ref 8–23)
CO2: 26 mmol/L (ref 22–32)
Calcium: 8.3 mg/dL — ABNORMAL LOW (ref 8.9–10.3)
Chloride: 106 mmol/L (ref 98–111)
Creatinine, Ser: 1.07 mg/dL (ref 0.61–1.24)
GFR calc Af Amer: >60 mL/min (ref >60)
GFR calc non Af Amer: >60 mL/min (ref >60)
Glucose, Bld: 97 mg/dL (ref 70–99)
Potassium: 4 mmol/L (ref 3.5–5.1)
Sodium: 140 mmol/L (ref 135–145)
Total Bilirubin: 0.8 mg/dL (ref 0.3–1.2)
Total Protein: 5.3 g/dL — ABNORMAL LOW (ref 6.5–8.1)

## 2020-03-30 LAB — CBC WITH DIFFERENTIAL/PLATELET
Abs Immature Granulocytes: 0.01 10*3/uL (ref 0.00–0.07)
Basophils Absolute: 0.1 10*3/uL (ref 0.0–0.1)
Basophils Relative: 1 %
Eosinophils Absolute: 0 10*3/uL (ref 0.0–0.5)
Eosinophils Relative: 0 %
HCT: 33.9 % — ABNORMAL LOW (ref 39.0–52.0)
Hemoglobin: 11.4 g/dL — ABNORMAL LOW (ref 13.0–17.0)
Immature Granulocytes: 0 %
Lymphocytes Relative: 21 %
Lymphs Abs: 0.8 10*3/uL (ref 0.7–4.0)
MCH: 32.4 pg (ref 26.0–34.0)
MCHC: 33.6 g/dL (ref 30.0–36.0)
MCV: 96.3 fL (ref 80.0–100.0)
Monocytes Absolute: 0.3 10*3/uL (ref 0.1–1.0)
Monocytes Relative: 8 %
Neutro Abs: 2.5 10*3/uL (ref 1.7–7.7)
Neutrophils Relative %: 70 %
Platelets: 114 10*3/uL — ABNORMAL LOW (ref 150–400)
RBC: 3.52 MIL/uL — ABNORMAL LOW (ref 4.22–5.81)
RDW: 14.6 % (ref 11.5–15.5)
WBC: 3.6 10*3/uL — ABNORMAL LOW (ref 4.0–10.5)
nRBC: 0 % (ref 0.0–0.2)

## 2020-03-30 LAB — PSA: Prostatic Specific Antigen: 0.93 ng/mL (ref 0.00–4.00)

## 2020-03-30 MED ORDER — HEPARIN SOD (PORK) LOCK FLUSH 100 UNIT/ML IV SOLN
500.0000 [IU] | Freq: Once | INTRAVENOUS | Status: AC
  Administered 2020-03-30: 500 [IU] via INTRAVENOUS
  Filled 2020-03-30: qty 5

## 2020-03-30 MED ORDER — PEGFILGRASTIM 6 MG/0.6ML ~~LOC~~ PSKT
6.0000 mg | PREFILLED_SYRINGE | Freq: Once | SUBCUTANEOUS | Status: AC
  Administered 2020-03-30: 6 mg via SUBCUTANEOUS
  Filled 2020-03-30: qty 0.6

## 2020-03-30 MED ORDER — SODIUM CHLORIDE 0.9 % IV SOLN
75.0000 mg/m2 | Freq: Once | INTRAVENOUS | Status: AC
  Administered 2020-03-30: 180 mg via INTRAVENOUS
  Filled 2020-03-30: qty 18

## 2020-03-30 MED ORDER — HEPARIN SOD (PORK) LOCK FLUSH 100 UNIT/ML IV SOLN
INTRAVENOUS | Status: AC
  Filled 2020-03-30: qty 5

## 2020-03-30 MED ORDER — SODIUM CHLORIDE 0.9% FLUSH
10.0000 mL | Freq: Once | INTRAVENOUS | Status: AC
  Administered 2020-03-30: 10 mL via INTRAVENOUS
  Filled 2020-03-30: qty 10

## 2020-03-30 MED ORDER — SODIUM CHLORIDE 0.9 % IV SOLN
10.0000 mg | Freq: Once | INTRAVENOUS | Status: AC
  Administered 2020-03-30: 10 mg via INTRAVENOUS
  Filled 2020-03-30: qty 10

## 2020-03-30 MED ORDER — SODIUM CHLORIDE 0.9 % IV SOLN
Freq: Once | INTRAVENOUS | Status: AC
  Filled 2020-03-30: qty 250

## 2020-04-02 ENCOUNTER — Encounter: Payer: Self-pay | Admitting: Oncology

## 2020-04-02 ENCOUNTER — Inpatient Hospital Stay: Payer: Medicare HMO

## 2020-04-02 ENCOUNTER — Other Ambulatory Visit: Payer: Self-pay

## 2020-04-02 DIAGNOSIS — D500 Iron deficiency anemia secondary to blood loss (chronic): Secondary | ICD-10-CM

## 2020-04-02 DIAGNOSIS — Z95828 Presence of other vascular implants and grafts: Secondary | ICD-10-CM

## 2020-04-02 DIAGNOSIS — C7951 Secondary malignant neoplasm of bone: Secondary | ICD-10-CM

## 2020-04-02 DIAGNOSIS — C61 Malignant neoplasm of prostate: Secondary | ICD-10-CM

## 2020-04-02 DIAGNOSIS — Z5111 Encounter for antineoplastic chemotherapy: Secondary | ICD-10-CM | POA: Diagnosis not present

## 2020-04-02 MED ORDER — HEPARIN SOD (PORK) LOCK FLUSH 100 UNIT/ML IV SOLN
500.0000 [IU] | Freq: Once | INTRAVENOUS | Status: AC
  Administered 2020-04-02: 500 [IU] via INTRAVENOUS
  Filled 2020-04-02: qty 5

## 2020-04-02 MED ORDER — SODIUM CHLORIDE 0.9% FLUSH
10.0000 mL | Freq: Once | INTRAVENOUS | Status: AC
  Administered 2020-04-02: 10 mL via INTRAVENOUS
  Filled 2020-04-02: qty 10

## 2020-04-02 MED ORDER — SODIUM CHLORIDE 0.9 % IV SOLN
10.0000 mg | Freq: Once | INTRAVENOUS | Status: AC
  Administered 2020-04-02: 10 mg via INTRAVENOUS
  Filled 2020-04-02: qty 10

## 2020-04-02 MED ORDER — HEPARIN SOD (PORK) LOCK FLUSH 100 UNIT/ML IV SOLN
INTRAVENOUS | Status: AC
  Filled 2020-04-02: qty 5

## 2020-04-02 MED ORDER — SODIUM CHLORIDE 0.9 % IV SOLN
Freq: Once | INTRAVENOUS | Status: AC
  Filled 2020-04-02: qty 250

## 2020-04-02 NOTE — Progress Notes (Signed)
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 He has not had any ascites or labs suggestive of chronic liver disease. He does not drink alcohol anymore. Patient was seen by scale for ongoing urinary symptoms of frequency especially at night. This was followed by a CT renal stone study which showed cyst in the upper pole of the right kidney as well as left kidney. S/p gastric bypass. Pelvic node enlargement in the right pelvis 11 mm. Bulky rounded lymph node along the external iliac chain 17 mm. Prostate was enlarged measuring 4.3 cm. Right obturator lymph node 1 cm. Sclerosis of L3. Left hemisacrum with sclerotic lesion. Sclerosis of the superior pubic rami. Large area of sclerosis affecting the right hemisacrum 4.7 x 2.4 cm. Left ninth and 11th rib with sclerotic lesions compatible with bone metastases. Right hip sclerosis. PSA was elevated at 18.6.  Patient underwent 12 core prostate biopsy which showed adenocarcinoma Gleason score 9(4+5)grade group 5.  Patient got 2 doses of [place] will be switched to Lupron. Docetaxel chemotherapy started on 12/16/2019.   Interval history-has been feeling more  fatigued since the start of chemotherapy.  Denies any significant tingling numbness in his extremities.  He had a rash after second cycle of chemotherapy which has not recurred and is slowly healing since then.  He does admit to photosensitivity especially over his scalp and his hands since they are not always covered.  He tries to wear a cap all times.  Reports having trouble seeing from both his eyes and frequent tearing.  He plans to get his eyes checked following completion of chemotherapy.  ECOG PS- 2 Pain scale- 0 Opioid associated constipation- no  Review of systems- Review of Systems  Constitutional: Positive for malaise/fatigue. Negative for chills, fever and weight loss.  HENT: Negative for congestion, ear discharge and nosebleeds.        Eyes tearing  Eyes: Negative for blurred vision.  Respiratory: Negative for cough, hemoptysis, sputum production, shortness of breath and wheezing.   Cardiovascular: Negative for chest pain, palpitations, orthopnea and claudication.  Gastrointestinal: Negative for abdominal pain, blood in stool, constipation, diarrhea, heartburn, melena, nausea and vomiting.  Genitourinary: Negative for dysuria, flank pain, frequency, hematuria and urgency.  Musculoskeletal: Negative for back pain, joint pain and myalgias.  Skin: Positive for rash.  Neurological: Negative for dizziness, tingling, focal weakness, seizures, weakness and headaches.  Endo/Heme/Allergies: Does not bruise/bleed easily.  Psychiatric/Behavioral: Negative for depression and suicidal ideas. The patient does not have insomnia.        No Known Allergies   Past Medical History:  Diagnosis Date  . Anemia   . Arthritis    knees, shoulders  . CAD (coronary artery disease)    No Stents Present- per patient  . Cirrhosis ([place])    mild  . Colon polyps   . Essential hypertension 08/24/2008  .  GERD (gastroesophageal reflux disease)    patient denies  . History of alcohol abuse Last Drink-  2011  . History of gout   . Hypertension    no longer on meds  . Hypothyroidism   . Intestinal ulcer   . Iron deficiency anemia due to chronic blood loss 02/27/2017  . Other pancytopenia ([place]) 02/25/2017  . Overactive bladder   . Sleep apnea    improved since gastric bypass  . Thyroid disease      Past Surgical History:  Procedure Laterality Date  . BILATERAL TOTAL SHOULDER ARTHROPLASTY  1990's  . CIRCUMCISION REVISION N/A 03/13/2017   Procedure: CIRCUMCISION REVISION;  Surgeon: Nickie Retort, MD;  Location: [facility];  Service: Urology;  Laterality: N/A;  . COLONOSCOPY WITH PROPOFOL N/A 10/30/2016   Procedure: COLONOSCOPY WITH PROPOFOL;  Surgeon: Lucilla Lame, MD;  Location: [facility] END[facility]COPY;  Service: Endoscopy;  Laterality: N/A;  . ESOPHAGOGASTRODUODENOSCOPY (EGD) WITH PROPOFOL N/A 10/30/2016   Procedure: ESOPHAGOGASTRODUODENOSCOPY (EGD) WITH PROPOFOL;  Surgeon: Lucilla Lame, MD;  Location: [facility] END[facility];  Service: Endoscopy;  Laterality: N/A;  . ESOPHAGOGASTRODUODENOSCOPY (EGD) WITH PROPOFOL N/A 03/26/2017   Procedure: ESOPHAGOGASTRODUODENOSCOPY (EGD) WITH PROPOFOL;  Surgeon: Lucilla Lame, MD;  Location: [place];  Service: Gastroenterology;  Laterality: N/A;  requests early  . GASTRIC BYPASS  2016   Dr. Darnell Level- [facility] [place] ARTHROSCOPY  1990s  . KNEE ARTHROSCOPY W/ MENISCAL REPAIR Bilateral   . KNEE ARTHROSCOPY WITH MEDIAL MENISECTOMY Left 04/21/2019   Procedure: KNEE ARTHROSCOPY WITH MEDIAL MENISECTOMY;  Surgeon: Lovell Sheehan, MD;  Location: [place];  Service: Orthopedics;  Laterality: Left;  . PORTA CATH INSERTION N/A 11/23/2019   Procedure: PORTA CATH INSERTION;  Surgeon: Algernon Huxley, MD;  Location: [place] [facility];  Service: Cardiovascular;  Laterality: N/A;  . TONSILLECTOMY  2004    Social History   Socioeconomic History  . Marital status: Married    Spouse name: Not on file  . Number of children: Not on file  . Years of education: Not  on file  . Highest education level: Not on file  Occupational History  . Not on file  Tobacco Use  . Smoking status: Never Smoker  . Smokeless tobacco: Current User    Types: Chew  Vaping Use  . Vaping Use: Never used  Substance and Sexual Activity  . Alcohol use: No    Comment: Heavy ETOH in past- Last Drink 2011 per patient  . Drug use: No  . Sexual activity: Not on file  Other Topics Concern  . Not on file  Social History Narrative  . Not on file   Social Determinants of Health   Financial Resource Strain:   . Difficulty of Paying Living Expenses: Not on file  Food Insecurity:   . Worried About Charity fundraiser in the Last Year: Not on file  . Ran Out of Food in the Last Year: Not on file  Transportation Needs:   . Lack of Transportation (Medical): Not on file  . Lack of Transportation (Non-Medical): Not on file  Physical Activity:   . Days of Exercise per Week: Not on file  . Minutes of Exercise per Session: Not on file  Stress:   . Feeling of Stress : Not on file  Social Connections:   . Frequency of Communication with Friends and Family: Not on file  . Frequency of Social Gatherings with Friends and Family: Not on file  . Attends Religious Services:  Not on file  . Active Member of Clubs or Organizations: Not on file  . Attends Archivist Meetings: Not on file  . Marital Status: Not on file  Intimate Partner Violence:   . Fear of Current or Ex-Partner: Not on file  . Emotionally Abused: Not on file  . Physically Abused: Not on file  . Sexually Abused: Not on file    Family History  Problem Relation Age of Onset  . Stroke Mother   . Breast cancer Mother   . Heart disease Father   . Stroke Father   . AAA (abdominal aortic aneurysm) Father   . Lung cancer Paternal Grandmother   . Prostate cancer Neg Hx   . Bladder Cancer Neg Hx   . Kidney cancer Neg Hx      Current Outpatient Medications:  .  levothyroxine (SYNTHROID, LEVOTHROID) 150 MCG  tablet, Take 1 tablet (150 mcg total) by mouth daily before breakfast., Disp: 90 tablet, Rfl: 1 .  lidocaine-prilocaine (EMLA) cream, Apply to affected area once, Disp: 30 g, Rfl: 3 .  LORazepam (ATIVAN) 0.5 MG tablet, Take 1 tablet (0.5 mg total) by mouth at bedtime., Disp: 30 tablet, Rfl: 0 .  dexamethasone (DECADRON) 4 MG tablet, Take 2 tablets (8 mg total) by mouth 2 (two) times daily. Start the day before Taxotere. Then daily after chemo for 2 days. (Patient not taking: Reported on 03/09/2020), Disp: 30 tablet, Rfl: 1 .  lactulose (CHRONULAC) 10 GM/15ML solution, TAKE 15MLS TWICE A DAY (Patient not taking: Reported on 03/09/2020), Disp: 950 mL, Rfl: 1 .  Multiple Vitamin (MULTIVITAMIN) capsule, Take 1 capsule by mouth daily. (Patient not taking: Reported on 01/27/2020), Disp: , Rfl:  .  ondansetron (ZOFRAN) 8 MG tablet, Take 1 tablet (8 mg total) by mouth 2 (two) times daily as needed for refractory nausea / vomiting. (Patient not taking: Reported on 01/27/2020), Disp: 30 tablet, Rfl: 1 .  pantoprazole (PROTONIX) 40 MG tablet, Take 1 tablet (40 mg total) by mouth 2 (two) times daily. (Patient not taking: Reported on 01/27/2020), Disp: 180 tablet, Rfl: 3 .  prochlorperazine (COMPAZINE) 10 MG tablet, Take 1 tablet (10 mg total) by mouth every 6 (six) hours as needed (Nausea or vomiting). (Patient not taking: Reported on 01/27/2020), Disp: 30 tablet, Rfl: 1 .  tamsulosin (FLOMAX) 0.4 MG CAPS capsule, Take 1 capsule (0.4 mg total) by mouth daily. (Patient not taking: Reported on 01/27/2020), Disp: 30 capsule, Rfl: 3 .  topiramate (TOPAMAX) 25 MG tablet, Take by mouth. (Patient not taking: Reported on 01/27/2020), Disp: , Rfl:  .  traZODone (DESYREL) 150 MG tablet, Take 4 tablets (600 mg total) by mouth at bedtime. (Patient not taking: Reported on 01/27/2020), Disp: 360 tablet, Rfl: 1 .  triamcinolone cream (KENALOG) 0.5 %, Apply 1 application topically 3 (three) times daily. (Patient not taking: Reported on  03/09/2020), Disp: 30 g, Rfl: 0 .  vitamin B-12 (CYANOCOBALAMIN) 500 MCG tablet, Take 1,000 mcg by mouth daily.  (Patient not taking: Reported on 01/27/2020), Disp: , Rfl:   Physical exam:  Vitals:   03/30/20 0919  BP: 97/63  Pulse: 84  Resp: 16  Temp: (!) 96.6 F (35.9 C)  TempSrc: Tympanic  SpO2: 95%  Weight: 226 lb 14.4 oz (102.9 kg)   Physical Exam Constitutional:      Comments: Appears fatigued.  In no acute distress  Cardiovascular:     Rate and Rhythm: Normal rate and regular rhythm.     Heart sounds:  Normal heart sounds.  Pulmonary:     Effort: Pulmonary effort is normal.     Breath sounds: Normal breath sounds.  Abdominal:     General: Bowel sounds are normal.     Palpations: Abdomen is soft.  Skin:    General: Skin is warm and dry.  Neurological:     Mental Status: He is alert and oriented to person, place, and time.      CMP Latest Ref Rng & Units 03/30/2020  Glucose 70 - 99 mg/dL 97  BUN 8 - 23 mg/dL 13  Creatinine 0.61 - 1.24 mg/dL 1.07  Sodium 135 - 145 mmol/L 140  Potassium 3.5 - 5.1 mmol/L 4.0  Chloride 98 - 111 mmol/L 106  CO2 22 - 32 mmol/L 26  Calcium 8.9 - 10.3 mg/dL 8.3(L)  Total Protein 6.5 - 8.1 g/dL 5.3(L)  Total Bilirubin 0.3 - 1.2 mg/dL 0.8  Alkaline Phos 38 - 126 U/L 55  AST 15 - 41 U/L 23  ALT 0 - 44 U/L 11   CBC Latest Ref Rng & Units 03/30/2020  WBC 4.0 - 10.5 K/uL 3.6(L)  Hemoglobin 13.0 - 17.0 g/dL 11.4(L)  Hematocrit 39 - 52 % 33.9(L)  Platelets 150 - 400 K/uL 114(L)    Assessment and plan- Patient is a 69 y.o. male with castrate sensitive metastatic prostate cancer.He is here for on treatment assessment prior to cycle 6 of docetaxel chemotherapy  Counts okay to proceed with cycle 6 of docetaxel chemotherapy today.  This would be his last chemo.  Plan to get repeat CT chest abdomen pelvis with contrast and a bone scan after this cycle.  PSA has now normalized and continuing to trend down  Patient will be due for his next  Lupron in 10 days time.  Decreased visual acuity: Patient will get his eyes checked after finishing chemotherapy and if problems continue I will refer him to ophthalmology as well.  Unlikely to be due to chemo.  Skin rash: Likely photosensitivity from chemotherapy.  No new areas and all areas are slowly healing.  Patient will receive IV fluids today as well as on 04/02/2020.  Mild thrombocytopenia: Chronic continue to monitor   Visit Diagnosis 1. Prostate cancer metastatic to bone ([place])   2. Encounter for antineoplastic chemotherapy      Dr. Randa Evens, MD, MPH [facility] [phone] 04/02/2020 8:39 AM

## 2020-04-13 ENCOUNTER — Other Ambulatory Visit: Payer: Self-pay | Admitting: Oncology

## 2020-04-13 ENCOUNTER — Ambulatory Visit
Admission: RE | Admit: 2020-04-13 | Discharge: 2020-04-13 | Disposition: A | Discharge status: Home / Self Care | Payer: Medicare HMO | Source: Ambulatory Visit | Attending: Oncology | Admitting: Oncology

## 2020-04-13 ENCOUNTER — Encounter
Admission: RE | Admit: 2020-04-13 | Discharge: 2020-04-13 | Disposition: A | Discharge status: Home / Self Care | Payer: Medicare HMO | Source: Ambulatory Visit | Attending: Oncology | Admitting: Oncology

## 2020-04-13 ENCOUNTER — Other Ambulatory Visit: Payer: Self-pay

## 2020-04-13 DIAGNOSIS — C61 Malignant neoplasm of prostate: Secondary | ICD-10-CM

## 2020-04-13 DIAGNOSIS — C7951 Secondary malignant neoplasm of bone: Secondary | ICD-10-CM | POA: Diagnosis present

## 2020-04-13 HISTORY — DX: Malignant (primary) neoplasm, unspecified: C80.1

## 2020-04-13 MED ORDER — TECHNETIUM TC 99M MEDRONATE IV KIT: 20.0000 | PACK | Freq: Once | INTRAVENOUS | Status: DC | PRN

## 2020-04-13 MED ORDER — IOHEXOL 300 MG/ML  SOLN
100.0000 mL | Freq: Once | INTRAMUSCULAR | Status: AC | PRN
  Administered 2020-04-13: 100 mL via INTRAVENOUS

## 2020-04-16 ENCOUNTER — Inpatient Hospital Stay: Payer: Medicare HMO

## 2020-04-16 ENCOUNTER — Inpatient Hospital Stay (HOSPITAL_BASED_OUTPATIENT_CLINIC_OR_DEPARTMENT_OTHER): Payer: Medicare HMO | Admitting: Oncology

## 2020-04-16 ENCOUNTER — Ambulatory Visit: Payer: Medicare HMO

## 2020-04-16 ENCOUNTER — Inpatient Hospital Stay: Payer: Medicare HMO | Attending: Oncology

## 2020-04-16 ENCOUNTER — Other Ambulatory Visit: Payer: Self-pay

## 2020-04-16 ENCOUNTER — Encounter: Payer: Self-pay | Admitting: Oncology

## 2020-04-16 VITALS — BP 97/66 | HR 86 | Temp 98.2°F | Resp 16 | Ht 71.0 in | Wt 221.0 lb

## 2020-04-16 DIAGNOSIS — Z79899 Other long term (current) drug therapy: Secondary | ICD-10-CM | POA: Insufficient documentation

## 2020-04-16 DIAGNOSIS — I2510 Atherosclerotic heart disease of native coronary artery without angina pectoris: Secondary | ICD-10-CM | POA: Diagnosis not present

## 2020-04-16 DIAGNOSIS — D500 Iron deficiency anemia secondary to blood loss (chronic): Secondary | ICD-10-CM

## 2020-04-16 DIAGNOSIS — C7951 Secondary malignant neoplasm of bone: Secondary | ICD-10-CM | POA: Insufficient documentation

## 2020-04-16 DIAGNOSIS — Z5181 Encounter for therapeutic drug level monitoring: Secondary | ICD-10-CM

## 2020-04-16 DIAGNOSIS — Z79818 Long term (current) use of other agents affecting estrogen receptors and estrogen levels: Secondary | ICD-10-CM | POA: Diagnosis not present

## 2020-04-16 DIAGNOSIS — E038 Other specified hypothyroidism: Secondary | ICD-10-CM | POA: Diagnosis not present

## 2020-04-16 DIAGNOSIS — C61 Malignant neoplasm of prostate: Secondary | ICD-10-CM

## 2020-04-16 DIAGNOSIS — K219 Gastro-esophageal reflux disease without esophagitis: Secondary | ICD-10-CM | POA: Diagnosis not present

## 2020-04-16 DIAGNOSIS — I10 Essential (primary) hypertension: Secondary | ICD-10-CM | POA: Diagnosis not present

## 2020-04-16 LAB — CBC WITH DIFFERENTIAL/PLATELET
Abs Immature Granulocytes: 0.02 10*3/uL (ref 0.00–0.07)
Basophils Absolute: 0 10*3/uL (ref 0.0–0.1)
Basophils Relative: 1 %
Eosinophils Absolute: 0 10*3/uL (ref 0.0–0.5)
Eosinophils Relative: 0 %
HCT: 37.3 % — ABNORMAL LOW (ref 39.0–52.0)
Hemoglobin: 12.6 g/dL — ABNORMAL LOW (ref 13.0–17.0)
Immature Granulocytes: 1 %
Lymphocytes Relative: 18 %
Lymphs Abs: 0.5 10*3/uL — ABNORMAL LOW (ref 0.7–4.0)
MCH: 32.7 pg (ref 26.0–34.0)
MCHC: 33.8 g/dL (ref 30.0–36.0)
MCV: 96.9 fL (ref 80.0–100.0)
Monocytes Absolute: 0.2 10*3/uL (ref 0.1–1.0)
Monocytes Relative: 6 %
Neutro Abs: 2.1 10*3/uL (ref 1.7–7.7)
Neutrophils Relative %: 74 %
Platelets: 115 10*3/uL — ABNORMAL LOW (ref 150–400)
RBC: 3.85 MIL/uL — ABNORMAL LOW (ref 4.22–5.81)
RDW: 14.9 % (ref 11.5–15.5)
WBC: 2.9 10*3/uL — ABNORMAL LOW (ref 4.0–10.5)
nRBC: 0 % (ref 0.0–0.2)

## 2020-04-16 LAB — COMPREHENSIVE METABOLIC PANEL
ALT: 12 U/L (ref 0–44)
AST: 24 U/L (ref 15–41)
Albumin: 3.1 g/dL — ABNORMAL LOW (ref 3.5–5.0)
Alkaline Phosphatase: 53 U/L (ref 38–126)
Anion gap: 8 (ref 5–15)
BUN: 13 mg/dL (ref 8–23)
CO2: 29 mmol/L (ref 22–32)
Calcium: 8.2 mg/dL — ABNORMAL LOW (ref 8.9–10.3)
Chloride: 104 mmol/L (ref 98–111)
Creatinine, Ser: 0.96 mg/dL (ref 0.61–1.24)
GFR, Estimated: >60 mL/min (ref >60)
Glucose, Bld: 101 mg/dL — ABNORMAL HIGH (ref 70–99)
Potassium: 3.8 mmol/L (ref 3.5–5.1)
Sodium: 141 mmol/L (ref 135–145)
Total Bilirubin: 0.7 mg/dL (ref 0.3–1.2)
Total Protein: 5.3 g/dL — ABNORMAL LOW (ref 6.5–8.1)

## 2020-04-16 LAB — PSA: Prostatic Specific Antigen: 0.88 ng/mL (ref 0.00–4.00)

## 2020-04-16 LAB — TSH: TSH: 25.816 u[IU]/mL — ABNORMAL HIGH (ref 0.350–4.500)

## 2020-04-16 MED ORDER — LORAZEPAM 0.5 MG PO TABS
0.5000 mg | ORAL_TABLET | Freq: Every day | ORAL | 0 refills | Status: DC
Start: 2020-04-16 — End: 2020-04-16

## 2020-04-16 MED ORDER — LORAZEPAM 0.5 MG PO TABS
0.5000 mg | ORAL_TABLET | Freq: Every day | ORAL | 0 refills | Status: DC
Start: 2020-04-16 — End: 2020-04-18

## 2020-04-16 MED ORDER — LEUPROLIDE ACETATE (3 MONTH) 22.5 MG ~~LOC~~ KIT
22.5000 mg | PACK | Freq: Once | SUBCUTANEOUS | Status: AC
  Administered 2020-04-16: 22.5 mg via SUBCUTANEOUS
  Filled 2020-04-16: qty 22.5

## 2020-04-17 ENCOUNTER — Other Ambulatory Visit: Payer: Self-pay | Admitting: Oncology

## 2020-04-18 LAB — TESTOSTERONE: Testosterone: <3 ng/dL — ABNORMAL LOW (ref 264–916)

## 2020-04-18 NOTE — Progress Notes (Signed)
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 He has not had any ascites or labs suggestive of chronic liver disease. He does not drink alcohol anymore. Patient was seen by scale for ongoing urinary symptoms of frequency especially at night. This was followed by a CT renal stone study which showed cyst in the upper pole of the right kidney as well as left kidney. S/p gastric bypass. Pelvic node enlargement in the right pelvis 11 mm. Bulky rounded lymph node along the external iliac chain 17 mm. Prostate was enlarged measuring 4.3 cm. Right obturator lymph node 1 cm. Sclerosis of L3. Left hemisacrum with sclerotic lesion. Sclerosis of the superior pubic rami. Large area of sclerosis affecting the right hemisacrum 4.7 x 2.4 cm. Left ninth and 11th rib with sclerotic lesions compatible with bone metastases. Right hip sclerosis. PSA was elevated at 18.6.  Patient underwent 12 core prostate biopsy which showed adenocarcinoma Gleason score 9(4+5)grade group 5.  Patient got 2 doses of [place] will be switched to Lupron. Docetaxel chemotherapy started on 12/16/2019.   Interval history-he is still slowly recovering from fatigue  associated with chemotherapy.  Denies any significant tingling numbness in his extremities.  He did develop photosensitive rash over his bilateral forearms as well as bilateral hands which is gradually getting better.  Also has tearing in his eyes and difficulty with tunnel vision which she plans to wait for the next few weeks and see how it goes before going for an eye appointment  ECOG PS- 1 Pain scale- 0   Review of systems- Review of Systems  Constitutional: Positive for malaise/fatigue. Negative for chills, fever and weight loss.  HENT: Negative for congestion, ear discharge and nosebleeds.   Eyes: Negative for blurred vision.  Respiratory: Negative for cough, hemoptysis, sputum production, shortness of breath and wheezing.   Cardiovascular: Negative for chest pain, palpitations, orthopnea and claudication.  Gastrointestinal: Negative for abdominal pain, blood in stool, constipation, diarrhea, heartburn, melena, nausea and vomiting.  Genitourinary: Negative for dysuria, flank pain, frequency, hematuria and urgency.  Musculoskeletal: Negative for back pain, joint pain and myalgias.  Skin: Positive for rash.  Neurological: Negative for dizziness, tingling, focal weakness, seizures, weakness and headaches.  Endo/Heme/Allergies: Does not bruise/bleed easily.  Psychiatric/Behavioral: Negative for depression and suicidal ideas. The patient does not have insomnia.       No Known Allergies   Past Medical History:  Diagnosis Date  . Anemia   . Arthritis    knees, shoulders  . CAD (coronary artery disease)    No Stents Present- per patient  . Cancer ([place])   . Cirrhosis ([place])    mild  . Colon polyps   . Essential hypertension 08/24/2008  . GERD (gastroesophageal reflux disease)    patient denies  . History of alcohol abuse Last Drink- 2011  . History of gout   . Hypertension  no longer on meds  . Hypothyroidism   . Intestinal ulcer   . Iron deficiency anemia due to chronic  blood loss 02/27/2017  . Other pancytopenia ([place]) 02/25/2017  . Overactive bladder   . Sleep apnea    improved since gastric bypass  . Thyroid disease      Past Surgical History:  Procedure Laterality Date  . BILATERAL TOTAL SHOULDER ARTHROPLASTY  1990's  . CIRCUMCISION REVISION N/A 03/13/2017   Procedure: CIRCUMCISION REVISION;  Surgeon: Nickie Retort, MD;  Location: [facility];  Service: Urology;  Laterality: N/A;  . COLONOSCOPY WITH PROPOFOL N/A 10/30/2016   Procedure: COLONOSCOPY WITH PROPOFOL;  Surgeon: Lucilla Lame, MD;  Location: [facility];  Service: Endoscopy;  Laterality: N/A;  . ESOPHAGOGASTRODUODENOSCOPY (EGD) WITH PROPOFOL N/A 10/30/2016   Procedure: ESOPHAGOGASTRODUODENOSCOPY (EGD) WITH PROPOFOL;  Surgeon: Lucilla Lame, MD;  Location: [facility];  Service: Endoscopy;  Laterality: N/A;  . ESOPHAGOGASTRODUODENOSCOPY (EGD) WITH PROPOFOL N/A 03/26/2017   Procedure: ESOPHAGOGASTRODUODENOSCOPY (EGD) WITH PROPOFOL;  Surgeon: Lucilla Lame, MD;  Location: [place];  Service: Gastroenterology;  Laterality: N/A;  requests early  . GASTRIC BYPASS  2016   Dr. Darnell Level- [facility] [place] ARTHROSCOPY  1990s  . KNEE ARTHROSCOPY W/ MENISCAL REPAIR Bilateral   . KNEE ARTHROSCOPY WITH MEDIAL MENISECTOMY Left 04/21/2019   Procedure: KNEE ARTHROSCOPY WITH MEDIAL MENISECTOMY;  Surgeon: Lovell Sheehan, MD;  Location: [place];  Service: Orthopedics;  Laterality: Left;  . PORTA CATH INSERTION N/A 11/23/2019   Procedure: PORTA CATH INSERTION;  Surgeon: Algernon Huxley, MD;  Location: [place] [facility];  Service: Cardiovascular;  Laterality: N/A;  . TONSILLECTOMY  2004    Social History   Socioeconomic History  . Marital status: Married    Spouse name: Not on file  . Number of children: Not on file  . Years of education: Not on file  . Highest education level: Not on file  Occupational History  . Not on file  Tobacco Use  . Smoking status: Never Smoker  . Smokeless  tobacco: Current User    Types: Chew  Vaping Use  . Vaping Use: Never used  Substance and Sexual Activity  . Alcohol use: No    Comment: Heavy ETOH in past- Last Drink 2011 per patient  . Drug use: No  . Sexual activity: Not on file  Other Topics Concern  . Not on file  Social History Narrative  . Not on file   Social Determinants of Health   Financial Resource Strain:   . Difficulty of Paying Living Expenses: Not on file  Food Insecurity:   . Worried About Charity fundraiser in the Last Year: Not on file  . Ran Out of Food in the Last Year: Not on file  Transportation Needs:   . Lack of Transportation (Medical): Not on file  . Lack of Transportation (Non-Medical): Not on file  Physical Activity:   . Days of Exercise per Week: Not on file  . Minutes of Exercise per Session: Not on file  Stress:   . Feeling of Stress : Not on file  Social Connections:   . Frequency of Communication with Friends and Family: Not on file  . Frequency of Social Gatherings with Friends and Family: Not on file  . Attends Religious Services: Not on file  . Active Member of Clubs or Organizations: Not on file  . Attends Archivist Meetings: Not on file  . Marital Status: Not on  file  Intimate Partner Violence:   . Fear of Current or Ex-Partner: Not on file  . Emotionally Abused: Not on file  . Physically Abused: Not on file  . Sexually Abused: Not on file    Family History  Problem Relation Age of Onset  . Stroke Mother   . Breast cancer Mother   . Heart disease Father   . Stroke Father   . AAA (abdominal aortic aneurysm) Father   . Lung cancer Paternal Grandmother   . Prostate cancer Neg Hx   . Bladder Cancer Neg Hx   . Kidney cancer Neg Hx      Current Outpatient Medications:  .  levothyroxine (SYNTHROID, LEVOTHROID) 150 MCG tablet, Take 1 tablet (150 mcg total) by mouth daily before breakfast., Disp: 90 tablet, Rfl: 1 .  lidocaine-prilocaine (EMLA) cream, Apply to  affected area once, Disp: 30 g, Rfl: 3 .  traZODone (DESYREL) 150 MG tablet, Take 4 tablets (600 mg total) by mouth at bedtime., Disp: 360 tablet, Rfl: 1 .  dexamethasone (DECADRON) 4 MG tablet, Take 2 tablets (8 mg total) by mouth 2 (two) times daily. Start the day before Taxotere. Then daily after chemo for 2 days. (Patient not taking: Reported on 03/09/2020), Disp: 30 tablet, Rfl: 1 .  lactulose (CHRONULAC) 10 GM/15ML solution, TAKE 15MLS TWICE A DAY (Patient not taking: Reported on 03/09/2020), Disp: 950 mL, Rfl: 1 .  LORazepam (ATIVAN) 0.5 MG tablet, TAKE 1 TABLET BY MOUTH AT BEDTIME, Disp: 30 tablet, Rfl: 0 .  Multiple Vitamin (MULTIVITAMIN) capsule, Take 1 capsule by mouth daily. (Patient not taking: Reported on 01/27/2020), Disp: , Rfl:  .  ondansetron (ZOFRAN) 8 MG tablet, Take 1 tablet (8 mg total) by mouth 2 (two) times daily as needed for refractory nausea / vomiting. (Patient not taking: Reported on 01/27/2020), Disp: 30 tablet, Rfl: 1 .  pantoprazole (PROTONIX) 40 MG tablet, Take 1 tablet (40 mg total) by mouth 2 (two) times daily. (Patient not taking: Reported on 01/27/2020), Disp: 180 tablet, Rfl: 3 .  prochlorperazine (COMPAZINE) 10 MG tablet, Take 1 tablet (10 mg total) by mouth every 6 (six) hours as needed (Nausea or vomiting). (Patient not taking: Reported on 01/27/2020), Disp: 30 tablet, Rfl: 1 .  tamsulosin (FLOMAX) 0.4 MG CAPS capsule, Take 1 capsule (0.4 mg total) by mouth daily. (Patient not taking: Reported on 01/27/2020), Disp: 30 capsule, Rfl: 3 .  topiramate (TOPAMAX) 25 MG tablet, Take by mouth. (Patient not taking: Reported on 01/27/2020), Disp: , Rfl:  .  triamcinolone cream (KENALOG) 0.5 %, Apply 1 application topically 3 (three) times daily. (Patient not taking: Reported on 03/09/2020), Disp: 30 g, Rfl: 0 .  vitamin B-12 (CYANOCOBALAMIN) 500 MCG tablet, Take 1,000 mcg by mouth daily.  (Patient not taking: Reported on 01/27/2020), Disp: , Rfl:   Physical exam:  Vitals:   04/16/20  1115  BP: 97/66  Pulse: 86  Resp: 16  Temp: 98.2 F (36.8 C)  TempSrc: Oral  SpO2: 96%  Weight: 221 lb (100.2 kg)  Height: 5\' 11"  (1.803 m)   Physical Exam Constitutional:      General: He is not in acute distress. Cardiovascular:     Rate and Rhythm: Normal rate and regular rhythm.     Heart sounds: Normal heart sounds.  Pulmonary:     Effort: Pulmonary effort is normal.     Breath sounds: Normal breath sounds.  Abdominal:     General: Bowel sounds are normal.  Palpations: Abdomen is soft.  Musculoskeletal:     Cervical back: Normal range of motion.  Skin:    General: Skin is warm and dry.     Comments: Skin appears dry and flaky over bilateral forearms with some areas of erythema all noted in the sun exposed areas  Neurological:     Mental Status: He is alert and oriented to person, place, and time.      CMP Latest Ref Rng & Units 04/16/2020  Glucose 70 - 99 mg/dL 101(H)  BUN 8 - 23 mg/dL 13  Creatinine 0.61 - 1.24 mg/dL 0.96  Sodium 135 - 145 mmol/L 141  Potassium 3.5 - 5.1 mmol/L 3.8  Chloride 98 - 111 mmol/L 104  CO2 22 - 32 mmol/L 29  Calcium 8.9 - 10.3 mg/dL 8.2(L)  Total Protein 6.5 - 8.1 g/dL 5.3(L)  Total Bilirubin 0.3 - 1.2 mg/dL 0.7  Alkaline Phos 38 - 126 U/L 53  AST 15 - 41 U/L 24  ALT 0 - 44 U/L 12   CBC Latest Ref Rng & Units 04/16/2020  WBC 4.0 - 10.5 K/uL 2.9(L)  Hemoglobin 13.0 - 17.0 g/dL 12.6(L)  Hematocrit 39 - 52 % 37.3(L)  Platelets 150 - 400 K/uL 115(L)    No images are attached to the encounter.  NM Bone Scan Whole Body  Result Date: 04/13/2020 CLINICAL DATA:  Prostate cancer metastatic to bone EXAM: NUCLEAR MEDICINE WHOLE BODY BONE SCAN TECHNIQUE: Whole body anterior and posterior images were obtained approximately 3 hours after intravenous injection of radiopharmaceutical. RADIOPHARMACEUTICALS:  22.371 mCi Technetium-79m MDP IV COMPARISON:  11/16/2019 Correlation: CT chest abdomen pelvis 04/13/2020 FINDINGS: Multiple sites of  abnormal tracer uptake are seen consistent with osseous metastatic disease. These include adjacent levels in the midthoracic spine, LEFT cervical spine, BILATERAL ribs, sternum, and pelvis. All of the observed sites demonstrate a lesser degree of tracer uptake than on prior exam. Uptake LEFT mandible, could be metastatic or related to dental disease. Uptake mid RIGHT tibia unchanged, question from remote fracture versus metastasis calculi and correlation with patient history. No new sites of abnormal tracer accumulation are seen. Expected urinary tract and soft tissue distribution of tracer. IMPRESSION: Decreased degree of tracer uptake at multiple osseous metastases when compared to the previous exam. No new scintigraphic abnormalities. Uptake in the mid RIGHT tibia, question sequela of remote trauma though metastatic lesion is not excluded, recommend correlation with patient history; in the absence of a history of fracture, recommend radiographic correlation (if not previously performed). Electronically Signed   By: Lavonia Dana M.D.   On: 04/13/2020 18:06   CT CHEST ABDOMEN PELVIS W CONTRAST  Result Date: 04/13/2020 CLINICAL DATA:  Evaluate metastatic prostate cancer EXAM: CT CHEST, ABDOMEN, AND PELVIS WITH CONTRAST TECHNIQUE: Multidetector CT imaging of the chest, abdomen and pelvis was performed following the standard protocol during bolus administration of intravenous contrast. CONTRAST:  149mL OMNIPAQUE IOHEXOL 300 MG/ML SOLN, additional oral enteric contrast COMPARISON:  CT chest, 11/25/2019 CT abdomen pelvis, 10/28/2019 FINDINGS: CT CHEST FINDINGS Cardiovascular: Right chest port catheter. Aortic atherosclerosis. Normal heart size. Three-vessel coronary artery calcifications. No pericardial effusion. Mediastinum/Nodes: No enlarged mediastinal, hilar, or axillary lymph nodes. Thyroid gland, trachea, and esophagus demonstrate no significant findings. Lungs/Pleura: Scattered irregular heterogeneous airspace  opacities throughout the lungs. Trace left pleural effusion. Musculoskeletal: No chest wall mass. New and increased sclerotic osseous lesions, for example a new lesion of the right aspect of the T2 vertebral body (series 8, image 99) and an increased,  densely sclerotic lesion of the T6 vertebral body (series 8, image 105). CT ABDOMEN PELVIS FINDINGS Hepatobiliary: No solid liver abnormality is seen. No gallstones, gallbladder wall thickening, or biliary dilatation. Pancreas: Unremarkable. No pancreatic ductal dilatation or surrounding inflammatory changes. Spleen: Normal in size without significant abnormality. Adrenals/Urinary Tract: Adrenal glands are unremarkable. Kidneys are normal, without renal calculi, solid lesion, or hydronephrosis. Bladder is unremarkable. 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 Appendix appears normal. No evidence of bowel wall thickening, distention, or inflammatory changes. Vascular/Lymphatic: Aortic atherosclerosis. No enlarged abdominal or pelvic lymph nodes. Reproductive: Status post prostatectomy. Other: No abdominal wall hernia or abnormality. No abdominopelvic ascites. Musculoskeletal: No acute osseous findings. Interval increase in multiple sclerotic osseous lesions, for example a densely sclerotic lesion of the L1 vertebral body (series 8, image 105). IMPRESSION: 1. Interval increase in multiple sclerotic osseous lesions, for example a new lesion of the right aspect of the T2 vertebral body and an increased, densely sclerotic lesion of the T6 vertebral body. Findings are consistent with worsened osseous metastatic disease. 2. No evidence of soft tissue metastatic disease in the chest, abdomen, or pelvis. 3. Scattered irregular heterogeneous airspace opacities throughout the lungs, nonspecific and infectious or inflammatory, potentially including COVID-19 airspace disease. Trace left pleural effusion. 4. Status post prostatectomy. 5. 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 6. Coronary artery disease.  Aortic Atherosclerosis (ICD10-I70.0). Electronically Signed   By: Eddie Candle M.D.   On: 04/13/2020 14:24     Assessment and plan- Patient is a 69 y.o. male with castrate sensitive metastatic prostate cancer and bone metastases.  He is here to discuss CT scan results and bone scan and further management  Prior to starting chemotherapy patient's PSA was elevated at 18 and presently it is down to0.8.  CT chest abdomen and pelvis suggested new T2 vertebral body lesion as well as other new lesions concerning for disease progression.  On the other hand bone scan appears to show decreased degree of tracer uptake.  Clinically and based on rereferral patient seems to have responded to treatment.  I will discuss his case at tumor board but at this time I am inclined to repeat CT and bone scan in 3 months to assess his disease status before considering changing present management.    Patient has completed 6 cycles of docetaxel chemotherapy and will not be getting any chemotherapy at this time.  He will continue to get Lupron every 3 months and will get his Lupron shot today.   Patient has a history of hypothyroidism but has not been compliant with his thyroid medicine.  We did check his TSH today which continues to be elevated at 25 and we have asked him to get in touch with his primary care doctor about this   Visit Diagnosis 1. Prostate cancer metastatic to bone ([place])   2. Encounter for monitoring Lupron therapy   3. Other specified hypothyroidism      Dr. Randa Evens, MD, MPH [facility] at [facility] [phone] 04/18/2020 12:03 PM

## 2020-04-18 NOTE — Progress Notes (Signed)
I have sent the results by inbasket and asked md if he could monitor his levels and and any intervention if needed

## 2020-04-19 ENCOUNTER — Other Ambulatory Visit: Payer: Medicare HMO

## 2020-04-19 NOTE — Progress Notes (Signed)
Tumor Board Documentation  Dylan Ho was presented by Dr Janese Banks at our Tumor Board on 04/19/2020, which included representatives from medical oncology, radiation oncology, surgical oncology, surgical, pharmacy, internal medicine, navigation, pathology, radiology, genetics, research, palliative care, pulmonology.  Dylan Ho currently presents as a current patient, for discussion with history of the following treatments: active survellience, adjuvant chemotherapy.  Additionally, we reviewed previous medical and familial history, history of present illness, and recent lab results along with all available histopathologic and imaging studies. The tumor board considered available treatment options and made the following recommendations: Active surveillance (Rescan in 3 months (F18 PET vs Bone scan)) Lupron therapy  The following procedures/referrals were also placed: No orders of the defined types were placed in this encounter.   Clinical Trial Status: not discussed   Staging used:    AJCC Staging:       Group: Stage IV Prostate Cancer with Bone Mets   National site-specific guidelines   were discussed with respect to the case.  Tumor board is a meeting of clinicians from various specialty areas who evaluate and discuss patients for whom a multidisciplinary approach is being considered. Final determinations in the plan of care are those of the provider(s). The responsibility for follow up of recommendations given during tumor board is that of the provider.   Today's extended care, comprehensive team conference, Dylan Ho was not present for the discussion and was not examined.   Multidisciplinary Tumor Board is a multidisciplinary case peer review process.  Decisions discussed in the Multidisciplinary Tumor Board reflect the opinions of the specialists present at the conference without having examined the patient.  Ultimately, treatment and diagnostic decisions rest with the primary provider(s)  and the patient.

## 2020-07-11 ENCOUNTER — Encounter
Admission: RE | Admit: 2020-07-11 | Discharge: 2020-07-11 | Disposition: A | Discharge status: Home / Self Care | Payer: Medicare HMO | Source: Ambulatory Visit | Attending: Oncology | Admitting: Oncology

## 2020-07-11 ENCOUNTER — Other Ambulatory Visit: Payer: Self-pay

## 2020-07-11 DIAGNOSIS — C61 Malignant neoplasm of prostate: Secondary | ICD-10-CM | POA: Diagnosis present

## 2020-07-11 DIAGNOSIS — C7951 Secondary malignant neoplasm of bone: Secondary | ICD-10-CM | POA: Insufficient documentation

## 2020-07-11 MED ORDER — TECHNETIUM TC 99M MEDRONATE IV KIT
20.0000 | PACK | Freq: Once | INTRAVENOUS | Status: AC | PRN
  Administered 2020-07-11: 23.29 via INTRAVENOUS

## 2020-07-16 ENCOUNTER — Ambulatory Visit
Admission: RE | Admit: 2020-07-16 | Discharge: 2020-07-16 | Disposition: A | Discharge status: Home / Self Care | Payer: Medicare HMO | Source: Ambulatory Visit | Attending: Oncology | Admitting: Oncology

## 2020-07-16 ENCOUNTER — Other Ambulatory Visit: Payer: Self-pay

## 2020-07-16 DIAGNOSIS — C61 Malignant neoplasm of prostate: Secondary | ICD-10-CM | POA: Diagnosis not present

## 2020-07-16 DIAGNOSIS — C7951 Secondary malignant neoplasm of bone: Secondary | ICD-10-CM | POA: Insufficient documentation

## 2020-07-16 LAB — POCT I-STAT CREATININE: Creatinine, Ser: 1.1 mg/dL (ref 0.61–1.24)

## 2020-07-16 MED ORDER — IOHEXOL 300 MG/ML  SOLN
100.0000 mL | Freq: Once | INTRAMUSCULAR | Status: AC | PRN
  Administered 2020-07-16: 100 mL via INTRAVENOUS

## 2020-07-17 ENCOUNTER — Inpatient Hospital Stay (HOSPITAL_BASED_OUTPATIENT_CLINIC_OR_DEPARTMENT_OTHER): Payer: Medicare HMO | Admitting: Oncology

## 2020-07-17 ENCOUNTER — Inpatient Hospital Stay: Payer: Medicare HMO | Attending: Oncology

## 2020-07-17 ENCOUNTER — Encounter: Payer: Self-pay | Admitting: Oncology

## 2020-07-17 ENCOUNTER — Inpatient Hospital Stay: Payer: Medicare HMO

## 2020-07-17 VITALS — BP 92/68 | HR 122 | Temp 96.8°F | Resp 16 | Ht 71.0 in | Wt 216.5 lb

## 2020-07-17 DIAGNOSIS — C61 Malignant neoplasm of prostate: Secondary | ICD-10-CM | POA: Diagnosis present

## 2020-07-17 DIAGNOSIS — C7951 Secondary malignant neoplasm of bone: Secondary | ICD-10-CM | POA: Diagnosis not present

## 2020-07-17 DIAGNOSIS — Z5111 Encounter for antineoplastic chemotherapy: Secondary | ICD-10-CM | POA: Diagnosis not present

## 2020-07-17 DIAGNOSIS — Z79899 Other long term (current) drug therapy: Secondary | ICD-10-CM | POA: Diagnosis not present

## 2020-07-17 DIAGNOSIS — I2510 Atherosclerotic heart disease of native coronary artery without angina pectoris: Secondary | ICD-10-CM | POA: Diagnosis not present

## 2020-07-17 DIAGNOSIS — Z95828 Presence of other vascular implants and grafts: Secondary | ICD-10-CM

## 2020-07-17 DIAGNOSIS — Z9884 Bariatric surgery status: Secondary | ICD-10-CM | POA: Diagnosis not present

## 2020-07-17 DIAGNOSIS — Z8042 Family history of malignant neoplasm of prostate: Secondary | ICD-10-CM | POA: Diagnosis not present

## 2020-07-17 DIAGNOSIS — I10 Essential (primary) hypertension: Secondary | ICD-10-CM | POA: Insufficient documentation

## 2020-07-17 DIAGNOSIS — Z8249 Family history of ischemic heart disease and other diseases of the circulatory system: Secondary | ICD-10-CM | POA: Insufficient documentation

## 2020-07-17 DIAGNOSIS — G4730 Sleep apnea, unspecified: Secondary | ICD-10-CM | POA: Diagnosis not present

## 2020-07-17 DIAGNOSIS — Z801 Family history of malignant neoplasm of trachea, bronchus and lung: Secondary | ICD-10-CM | POA: Diagnosis not present

## 2020-07-17 DIAGNOSIS — E039 Hypothyroidism, unspecified: Secondary | ICD-10-CM | POA: Insufficient documentation

## 2020-07-17 DIAGNOSIS — Z803 Family history of malignant neoplasm of breast: Secondary | ICD-10-CM | POA: Insufficient documentation

## 2020-07-17 DIAGNOSIS — D500 Iron deficiency anemia secondary to blood loss (chronic): Secondary | ICD-10-CM

## 2020-07-17 LAB — CBC WITH DIFFERENTIAL/PLATELET
Abs Immature Granulocytes: 0 10*3/uL (ref 0.00–0.07)
Basophils Absolute: 0 10*3/uL (ref 0.0–0.1)
Basophils Relative: 1 %
Eosinophils Absolute: 0.1 10*3/uL (ref 0.0–0.5)
Eosinophils Relative: 3 %
HCT: 37.1 % — ABNORMAL LOW (ref 39.0–52.0)
Hemoglobin: 12.9 g/dL — ABNORMAL LOW (ref 13.0–17.0)
Immature Granulocytes: 0 %
Lymphocytes Relative: 18 %
Lymphs Abs: 0.5 10*3/uL — ABNORMAL LOW (ref 0.7–4.0)
MCH: 31.5 pg (ref 26.0–34.0)
MCHC: 34.8 g/dL (ref 30.0–36.0)
MCV: 90.5 fL (ref 80.0–100.0)
Monocytes Absolute: 0.1 10*3/uL (ref 0.1–1.0)
Monocytes Relative: 4 %
Neutro Abs: 2 10*3/uL (ref 1.7–7.7)
Neutrophils Relative %: 74 %
Platelets: 86 10*3/uL — ABNORMAL LOW (ref 150–400)
RBC: 4.1 MIL/uL — ABNORMAL LOW (ref 4.22–5.81)
RDW: 12.8 % (ref 11.5–15.5)
WBC: 2.7 10*3/uL — ABNORMAL LOW (ref 4.0–10.5)
nRBC: 0 % (ref 0.0–0.2)

## 2020-07-17 LAB — COMPREHENSIVE METABOLIC PANEL
ALT: 12 U/L (ref 0–44)
AST: 21 U/L (ref 15–41)
Albumin: 3.7 g/dL (ref 3.5–5.0)
Alkaline Phosphatase: 54 U/L (ref 38–126)
Anion gap: 6 (ref 5–15)
BUN: 14 mg/dL (ref 8–23)
CO2: 29 mmol/L (ref 22–32)
Calcium: 8.4 mg/dL — ABNORMAL LOW (ref 8.9–10.3)
Chloride: 102 mmol/L (ref 98–111)
Creatinine, Ser: 1.03 mg/dL (ref 0.61–1.24)
GFR, Estimated: >60 mL/min (ref >60)
Glucose, Bld: 130 mg/dL — ABNORMAL HIGH (ref 70–99)
Potassium: 3.2 mmol/L — ABNORMAL LOW (ref 3.5–5.1)
Sodium: 137 mmol/L (ref 135–145)
Total Bilirubin: 0.4 mg/dL (ref 0.3–1.2)
Total Protein: 5.6 g/dL — ABNORMAL LOW (ref 6.5–8.1)

## 2020-07-17 LAB — PSA: Prostatic Specific Antigen: 0.44 ng/mL (ref 0.00–4.00)

## 2020-07-17 MED ORDER — LEUPROLIDE ACETATE (3 MONTH) 22.5 MG ~~LOC~~ KIT
22.5000 mg | PACK | Freq: Once | SUBCUTANEOUS | Status: AC
  Administered 2020-07-17: 22.5 mg via SUBCUTANEOUS
  Filled 2020-07-17: qty 22.5

## 2020-07-17 MED ORDER — SODIUM CHLORIDE 0.9% FLUSH
10.0000 mL | Freq: Once | INTRAVENOUS | Status: AC
  Administered 2020-07-17: 10 mL via INTRAVENOUS
  Filled 2020-07-17: qty 10

## 2020-07-17 MED ORDER — HEPARIN SOD (PORK) LOCK FLUSH 100 UNIT/ML IV SOLN
INTRAVENOUS | Status: AC
  Filled 2020-07-17: qty 5

## 2020-07-17 MED ORDER — HEPARIN SOD (PORK) LOCK FLUSH 100 UNIT/ML IV SOLN
500.0000 [IU] | Freq: Once | INTRAVENOUS | Status: AC
  Administered 2020-07-17: 500 [IU] via INTRAVENOUS
  Filled 2020-07-17: qty 5

## 2020-07-17 MED ORDER — PANTOPRAZOLE SODIUM 40 MG PO TBEC: 40.0000 mg | DELAYED_RELEASE_TABLET | Freq: Every day | ORAL | 2 refills | Status: AC

## 2020-07-17 NOTE — Progress Notes (Signed)
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 He has not had any ascites or labs suggestive of chronic liver disease. He does not drink alcohol anymore. Patient was seen by scale for ongoing urinary symptoms of frequency especially at night. This was followed by a CT renal stone study which showed cyst in the upper pole of the right kidney as well as left kidney. S/p gastric bypass. Pelvic node enlargement in the right pelvis 11 mm. Bulky rounded lymph node along the external iliac chain 17 mm. Prostate was enlarged measuring 4.3 cm. Right obturator lymph node 1 cm.Sclerosis of L3. Left hemisacrum with sclerotic lesion. Sclerosis of the superior pubic rami. Large area of sclerosis affecting the right hemisacrum 4.7 x 2.4 cm. Left ninth and 11th rib with sclerotic lesions compatible with bone metastases. Right hip sclerosis. PSA was elevated at 18.6.  Patient underwent 12 core prostate biopsy which showed adenocarcinoma Gleason score 9(4+5)grade group 5.  Patient is currently on Lupron and finished 6 cycles of docetaxel chemotherapy In September 2021.  Interval history-patient's brother was recently diagnosed  with prostate cancer as well.  Patient continues to report fatigue.  He has been taking his levothyroxine intermittently.  Reports ongoing issues with blurry vision and floaters in his eye.  Reports epigastric abdominal pain which comes and goes over the last 3 months.  Reports that he has had a bleeding gastric ulcer in the past.  Denies any consistent use of NSAIDs  ECOG PS- 1 Pain scale- 3 Opioid associated constipation- no  Review of systems- Review of Systems  Constitutional: Positive for malaise/fatigue. Negative for chills, fever and weight loss.  HENT: Negative for congestion, ear discharge and nosebleeds.   Eyes: Negative for blurred vision.  Respiratory: Negative for cough, hemoptysis, sputum production, shortness of breath and wheezing.   Cardiovascular: Negative for chest pain, palpitations, orthopnea and claudication.  Gastrointestinal: Negative for abdominal pain, blood in stool, constipation, diarrhea, heartburn, melena, nausea and vomiting.  Genitourinary: Negative for dysuria, flank pain, frequency, hematuria and urgency.  Musculoskeletal: Negative for back pain, joint pain and myalgias.  Skin: Negative for rash.  Neurological: Negative for dizziness, tingling, focal weakness, seizures, weakness and headaches.  Endo/Heme/Allergies: Does not bruise/bleed easily.  Psychiatric/Behavioral: Negative for depression and suicidal ideas. The patient does not have insomnia.      No Known Allergies   Past Medical History:  Diagnosis Date  . Anemia   . Arthritis    knees, shoulders  . CAD (coronary artery disease)    No Stents Present- per patient  . Cancer ([place])   . Cirrhosis ([place])    mild  . Colon polyps   . Essential hypertension 08/24/2008  . GERD (gastroesophageal reflux disease)    patient denies  . History of alcohol abuse Last Drink- 2011  . History of gout   . Hypertension  no longer on meds  . Hypothyroidism   . Intestinal ulcer   . Iron deficiency anemia  due to chronic blood loss 02/27/2017  . Other pancytopenia ([place]) 02/25/2017  . Overactive bladder   . Sleep apnea    improved since gastric bypass  . Thyroid disease      Past Surgical History:  Procedure Laterality Date  . BILATERAL TOTAL SHOULDER ARTHROPLASTY  1990's  . CIRCUMCISION REVISION N/A 03/13/2017   Procedure: CIRCUMCISION REVISION;  Surgeon: Nickie Retort, MD;  Location: [facility];  Service: Urology;  Laterality: N/A;  . COLONOSCOPY WITH PROPOFOL N/A 10/30/2016   Procedure: COLONOSCOPY WITH PROPOFOL;  Surgeon: Lucilla Lame, MD;  Location: [facility];  Service: Endoscopy;  Laterality: N/A;  . ESOPHAGOGASTRODUODENOSCOPY (EGD) WITH PROPOFOL N/A 10/30/2016   Procedure: ESOPHAGOGASTRODUODENOSCOPY (EGD) WITH PROPOFOL;  Surgeon: Lucilla Lame, MD;  Location: [facility];  Service: Endoscopy;  Laterality: N/A;  . ESOPHAGOGASTRODUODENOSCOPY (EGD) WITH PROPOFOL N/A 03/26/2017   Procedure: ESOPHAGOGASTRODUODENOSCOPY (EGD) WITH PROPOFOL;  Surgeon: Lucilla Lame, MD;  Location: [facility];  Service: Gastroenterology;  Laterality: N/A;  requests early  . GASTRIC BYPASS  2016   Dr. Darnell Level- [facility] [place] ARTHROSCOPY  1990s  . KNEE ARTHROSCOPY W/ MENISCAL REPAIR Bilateral   . KNEE ARTHROSCOPY WITH MEDIAL MENISECTOMY Left 04/21/2019   Procedure: KNEE ARTHROSCOPY WITH MEDIAL MENISECTOMY;  Surgeon: Lovell Sheehan, MD;  Location: [place];  Service: Orthopedics;  Laterality: Left;  . PORTA CATH INSERTION N/A 11/23/2019   Procedure: PORTA CATH INSERTION;  Surgeon: Algernon Huxley, MD;  Location: [place] [facility];  Service: Cardiovascular;  Laterality: N/A;  . TONSILLECTOMY  2004    Social History   Socioeconomic History  . Marital status: Married    Spouse name: Not on file  . Number of children: Not on file  . Years of education: Not on file  . Highest education level: Not on file  Occupational History  . Not on file  Tobacco Use  . Smoking status: Never  Smoker  . Smokeless tobacco: Current User    Types: Chew  Vaping Use  . Vaping Use: Never used  Substance and Sexual Activity  . Alcohol use: No    Comment: Heavy ETOH in past- Last Drink 2011 per patient  . Drug use: No  . Sexual activity: Not on file  Other Topics Concern  . Not on file  Social History Narrative  . Not on file   Social Determinants of Health   Financial Resource Strain: Not on file  Food Insecurity: Not on file  Transportation Needs: Not on file  Physical Activity: Not on file  Stress: Not on file  Social Connections: Not on file  Intimate Partner Violence: Not on file    Family History  Problem Relation Age of Onset  . Stroke Mother   . Breast cancer Mother   . Heart disease Father   . Stroke Father   . AAA (abdominal aortic aneurysm) Father   . Lung cancer Paternal Grandmother   . Prostate cancer Brother   . Bladder Cancer Neg Hx   . Kidney cancer Neg Hx      Current Outpatient Medications:  .  levothyroxine (SYNTHROID, LEVOTHROID) 150 MCG tablet, Take 1 tablet (150 mcg total) by mouth daily before breakfast., Disp: 90 tablet, Rfl: 1 .  pantoprazole (PROTONIX) 40 MG tablet, Take 1 tablet (40 mg total) by mouth daily., Disp: 30 tablet, Rfl: 2 .  traZODone (DESYREL) 150  MG tablet, Take 4 tablets (600 mg total) by mouth at bedtime., Disp: 360 tablet, Rfl: 1 .  lactulose (CHRONULAC) 10 GM/15ML solution, TAKE 15MLS TWICE A DAY (Patient not taking: No sig reported), Disp: 950 mL, Rfl: 1 .  lidocaine-prilocaine (EMLA) cream, Apply to affected area once (Patient not taking: Reported on 07/17/2020), Disp: 30 g, Rfl: 3 .  LORazepam (ATIVAN) 0.5 MG tablet, TAKE 1 TABLET BY MOUTH AT BEDTIME (Patient not taking: Reported on 07/17/2020), Disp: 30 tablet, Rfl: 0 .  ondansetron (ZOFRAN) 8 MG tablet, Take 1 tablet (8 mg total) by mouth 2 (two) times daily as needed for refractory nausea / vomiting. (Patient not taking: No sig reported), Disp: 30 tablet, Rfl: 1 .   prochlorperazine (COMPAZINE) 10 MG tablet, Take 1 tablet (10 mg total) by mouth every 6 (six) hours as needed (Nausea or vomiting). (Patient not taking: No sig reported), Disp: 30 tablet, Rfl: 1 .  vitamin B-12 (CYANOCOBALAMIN) 500 MCG tablet, Take 1,000 mcg by mouth daily.  (Patient not taking: No sig reported), Disp: , Rfl:   Physical exam:  Vitals:   07/17/20 1138  BP: 92/68  Pulse: (!) 122  Resp: 16  Temp: (!) 96.8 F (36 C)  TempSrc: Tympanic  SpO2: 100%  Weight: 216 lb 8 oz (98.2 kg)  Height: 5\' 11"  (1.803 m)   Physical Exam Constitutional:      General: He is not in acute distress. Eyes:     Extraocular Movements: EOM normal.  Cardiovascular:     Rate and Rhythm: Regular rhythm. Tachycardia present.     Heart sounds: Normal heart sounds.  Pulmonary:     Effort: Pulmonary effort is normal.  Abdominal:     General: Bowel sounds are normal.     Palpations: Abdomen is soft.  Skin:    General: Skin is warm and dry.  Neurological:     Mental Status: He is alert and oriented to person, place, and time.      CMP Latest Ref Rng & Units 07/17/2020  Glucose 70 - 99 mg/dL 130(H)  BUN 8 - 23 mg/dL 14  Creatinine 0.61 - 1.24 mg/dL 1.03  Sodium 135 - 145 mmol/L 137  Potassium 3.5 - 5.1 mmol/L 3.2(L)  Chloride 98 - 111 mmol/L 102  CO2 22 - 32 mmol/L 29  Calcium 8.9 - 10.3 mg/dL 8.4(L)  Total Protein 6.5 - 8.1 g/dL 5.6(L)  Total Bilirubin 0.3 - 1.2 mg/dL 0.4  Alkaline Phos 38 - 126 U/L 54  AST 15 - 41 U/L 21  ALT 0 - 44 U/L 12   CBC Latest Ref Rng & Units 07/17/2020  WBC 4.0 - 10.5 K/uL 2.7(L)  Hemoglobin 13.0 - 17.0 g/dL 12.9(L)  Hematocrit 39.0 - 52.0 % 37.1(L)  Platelets 150 - 400 K/uL 86(L)    No images are attached to the encounter.  NM Bone Scan Whole Body  Result Date: 07/11/2020 CLINICAL DATA:  Prostate cancer metastatic to bone EXAM: NUCLEAR MEDICINE WHOLE BODY BONE SCAN TECHNIQUE: Whole body anterior and posterior images were obtained approximately 3 hours  after intravenous injection of radiopharmaceutical. RADIOPHARMACEUTICALS:  23.29 mCi Technetium-76m MDP IV COMPARISON:  04/13/2020 Radiographic correlation: None since prior bone scan FINDINGS: Multiple sites of abnormal osseous uptake consistent with osseous metastatic disease. These include BILATERAL ribs, sternum, RIGHT scapula, pelvis, and in spine at T6, T7, and L1. When compared to the previous exam, uptake at the sternum, RIGHT scapula, central upper sacrum, and T7 appears increased, with remaining sites  not significantly changed. Nonspecific tracer uptake at upper LEFT cervical spine. Degenerative type uptake of tracer at the sternoclavicular joints and AC joints. Chronic uptake of tracer at mid RIGHT tibial diaphysis, patient with known remote RIGHT tibial fracture. Expected urinary tract and soft tissue distribution of tracer. IMPRESSION: Multiple osseous metastases with slightly increased degree of uptake at multiple sites since previous exam. No new sites of abnormal tracer uptake are seen. Uptake at RIGHT tibial diaphysis unchanged, by history remote fracture. Electronically Signed   By: Lavonia Dana M.D.   On: 07/11/2020 14:25   CT CHEST ABDOMEN PELVIS W CONTRAST  Result Date: 07/16/2020 CLINICAL DATA:  Metastatic prostate cancer restaging EXAM: CT CHEST, ABDOMEN, AND PELVIS WITH CONTRAST TECHNIQUE: Multidetector CT imaging of the chest, abdomen and pelvis was performed following the standard protocol during bolus administration of intravenous contrast. CONTRAST:  125mL OMNIPAQUE IOHEXOL 300 MG/ML SOLN, additional oral enteric contrast COMPARISON:  04/13/2020 FINDINGS: CT CHEST FINDINGS Cardiovascular: Right chest port catheter. Normal heart size. Three-vessel coronary artery calcifications. No pericardial effusion. Mediastinum/Nodes: No enlarged mediastinal, hilar, or axillary lymph nodes. Thyroid gland, trachea, and esophagus demonstrate no significant findings. Lungs/Pleura: Small left pleural  effusion, slightly increased compared to prior examination. Irregular airspace opacities throughout the lungs are significantly improved, nearly resolved compared to prior examination, one notable opacity persisting in the central left lower lobe, decreased in size and solid character, measuring approximately 1.4 x 1.1 cm, previously 1.7 x 1.6 cm (series 2, image 85). Musculoskeletal: No chest wall mass. Unchanged sclerotic osseous metastatic disease of the ribs and vertebral bodies, particularly of the T6 vertebral body. CT ABDOMEN PELVIS FINDINGS Hepatobiliary: Somewhat shrunken, nodular appearance of the liver. No focal liver abnormality is seen. No gallstones, gallbladder wall thickening, or biliary dilatation. Pancreas: Unremarkable. No pancreatic ductal dilatation or surrounding inflammatory changes. Spleen: Normal in size without significant abnormality. Adrenals/Urinary Tract: Adrenal glands are unremarkable. Kidneys are normal, without renal calculi, solid lesion, or hydronephrosis. Bladder is unremarkable. Stomach/Bowel: Status post Roux type gastric bypass. Appendix appears normal. No evidence of bowel wall thickening, distention, or inflammatory changes. Moderate burden of stool and stool balls in the distal colon and rectum. Vascular/Lymphatic: Scattered aortic atherosclerosis. No enlarged abdominal or pelvic lymph nodes. Reproductive: No mass or other abnormality. Other: No abdominal wall hernia or abnormality. No abdominopelvic ascites. Musculoskeletal: No acute osseous findings. Unchanged sclerotic osseous metastatic disease of the vertebral bodies and bony pelvis. IMPRESSION: 1. Irregular airspace opacities throughout the lungs are significantly improved, nearly resolved compared to prior examination, one notable opacity persisting in the central left lower lobe, decreased in size and solid character. Findings are most consistent with subacute airspace disease, parenchymal pulmonary metastases not  favored in metastatic prostate cancer, although not strictly excluded. Attention on follow-up. 2. No definite evidence of soft tissue metastatic disease within the chest, abdomen or pelvis. 3. Unchanged sclerotic osseous metastatic disease. 4. Small left pleural effusion, slightly increased compared to prior examination. 5. Somewhat shrunken, nodular appearance of the liver, suggesting cirrhosis. Correlate with biochemical findings. 6. Coronary artery disease. Aortic Atherosclerosis (ICD10-I70.0). Electronically Signed   By: Eddie Candle M.D.   On: 07/16/2020 13:46     Assessment and plan- Patient is a 70 y.o. male with metastatic castrate sensitive prostate cancer here to discuss CT scan results and further management  PSA from today is pending.  CT chest abdomen pelvis with contrast did not show any evidence of recurrent malignancy.  Bone scan showed prior bone spots were appearing  more prominent but no new spots were seen.  At this time continuing to monitor his prostate cancer without switching him to any second line treatments given stable disease and a PSA which has been trending down.  He will receive his Lupron today and come back to see me in 3 months with CBC with differential CMP and PSA for the next dose of Lupron.  Blurry vision: This has been an ongoing issue since chemotherapy.  Since it has been over 3 months from stopping chemotherapy have asked him to see ophthalmology at this time  Epigastric abdominal pain: He is s/p gastric bypass and has a prior history of gastric ulcer.  I have started him on Protonix 40 mg once a day.  If pain does not get better after 1 to 2 weeks he knows to get in touch with Dr. Allen Norris.  Based on today's labs there is no evidence of iron deficiency anemia     Visit Diagnosis 1. Prostate cancer metastatic to bone [facility])      Dr. Randa Evens, MD, MPH [facility] [phone] 07/17/2020 3:26 PM

## 2020-08-20 ENCOUNTER — Other Ambulatory Visit: Payer: Medicare HMO

## 2020-08-20 ENCOUNTER — Encounter: Payer: Medicare HMO | Admitting: Licensed Clinical Social Worker

## 2020-08-27 ENCOUNTER — Telehealth: Payer: Self-pay | Admitting: Oncology

## 2020-08-27 NOTE — Telephone Encounter (Signed)
08/27/2020 Spoke with pt on the phone, he would like to cxl genetics consult and lab work, and he does not want to r/s. He understands why Dr. Janese Banks recommended it and referred him, but he still does not want to go [facility]

## 2020-08-30 ENCOUNTER — Other Ambulatory Visit: Payer: Medicare HMO

## 2020-08-30 ENCOUNTER — Encounter: Payer: Medicare HMO | Admitting: Licensed Clinical Social Worker

## 2020-09-04 ENCOUNTER — Other Ambulatory Visit: Payer: Medicare HMO

## 2020-09-04 DIAGNOSIS — Z20822 Contact with and (suspected) exposure to covid-19: Secondary | ICD-10-CM

## 2020-09-05 LAB — SARS-COV-2, NAA 2 DAY TAT

## 2020-09-05 LAB — NOVEL CORONAVIRUS, NAA: SARS-CoV-2, NAA: NOT DETECTED

## 2020-09-11 ENCOUNTER — Inpatient Hospital Stay: Payer: Medicare HMO | Attending: Oncology

## 2020-09-11 ENCOUNTER — Other Ambulatory Visit: Payer: Self-pay

## 2020-09-11 DIAGNOSIS — D500 Iron deficiency anemia secondary to blood loss (chronic): Secondary | ICD-10-CM

## 2020-09-11 DIAGNOSIS — Z452 Encounter for adjustment and management of vascular access device: Secondary | ICD-10-CM | POA: Insufficient documentation

## 2020-09-11 DIAGNOSIS — C61 Malignant neoplasm of prostate: Secondary | ICD-10-CM | POA: Diagnosis present

## 2020-09-11 DIAGNOSIS — Z95828 Presence of other vascular implants and grafts: Secondary | ICD-10-CM

## 2020-09-11 DIAGNOSIS — C7951 Secondary malignant neoplasm of bone: Secondary | ICD-10-CM | POA: Diagnosis not present

## 2020-09-11 MED ORDER — HEPARIN SOD (PORK) LOCK FLUSH 100 UNIT/ML IV SOLN
500.0000 [IU] | Freq: Once | INTRAVENOUS | Status: AC
  Administered 2020-09-11: 500 [IU] via INTRAVENOUS
  Filled 2020-09-11: qty 5

## 2020-09-11 MED ORDER — SODIUM CHLORIDE 0.9% FLUSH
10.0000 mL | INTRAVENOUS | Status: AC | PRN
  Administered 2020-09-11: 10 mL via INTRAVENOUS
  Filled 2020-09-11: qty 10

## 2020-09-11 MED ORDER — HEPARIN SOD (PORK) LOCK FLUSH 100 UNIT/ML IV SOLN
INTRAVENOUS | Status: AC
  Filled 2020-09-11: qty 5

## 2020-09-17 IMAGING — CT CT CHEST W/O CM
2 of 4 series · 15 of 36 positions shown, 18 images · non-contrast
Comparison: 11/16/2019 bone scan. Chest radiograph 03/15/2016.

CLINICAL DATA: Prostate cancer with bone metastasis. Pre
chemotherapy. Nonsmoker. Elevated creatinine.

EXAM:
CT CHEST WITHOUT CONTRAST
TECHNIQUE: Multidetector CT imaging of the chest was performed following the
standard protocol without IV contrast.

[Series 2: chest 2.00 · axial · 0.72mm/px · z∈[-1195,-887]mm · 12 of 183 slices shown, 15 images]
[im 15/183  mediastinal]
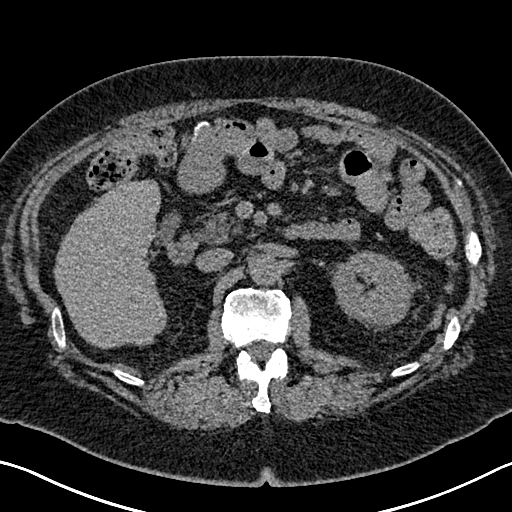
[im 15/183  lung]
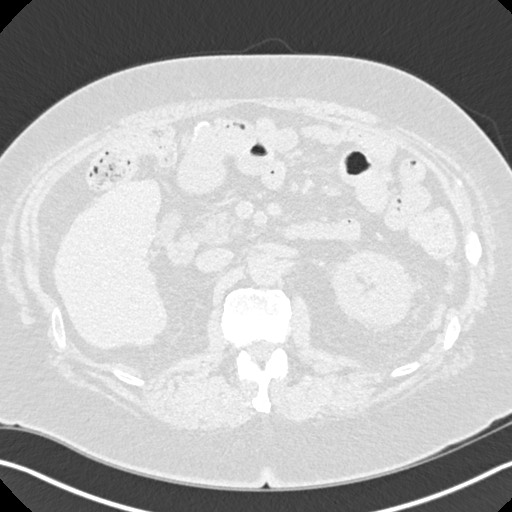
[im 29/183  lung]
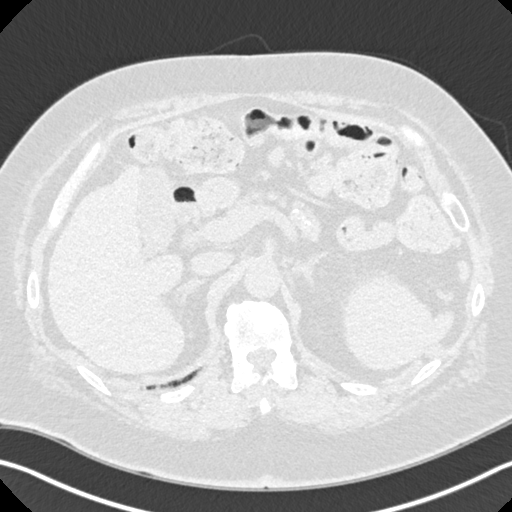
[im 43/183  lung]
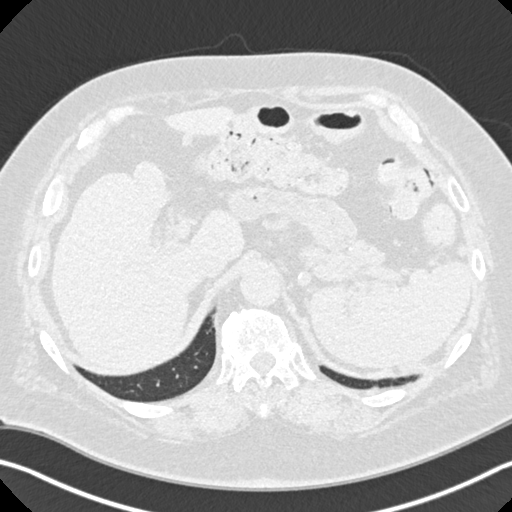
[im 57/183  lung]
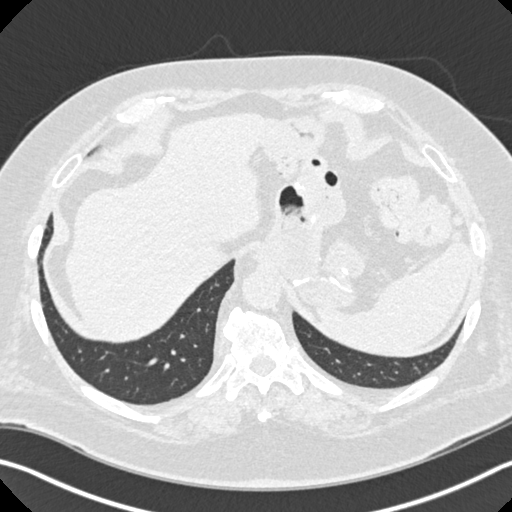
[im 71/183  mediastinal]
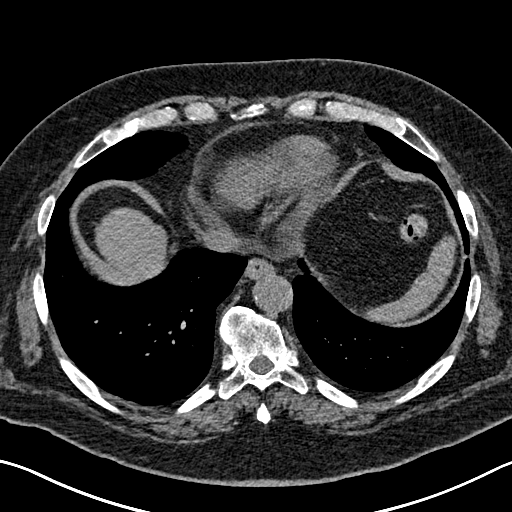
[im 71/183  lung]
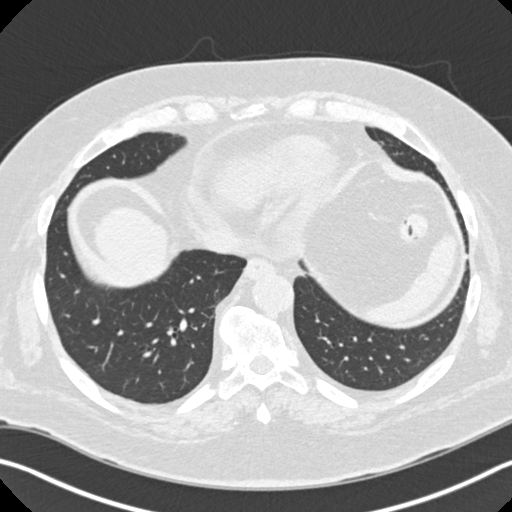
[im 85/183  lung]
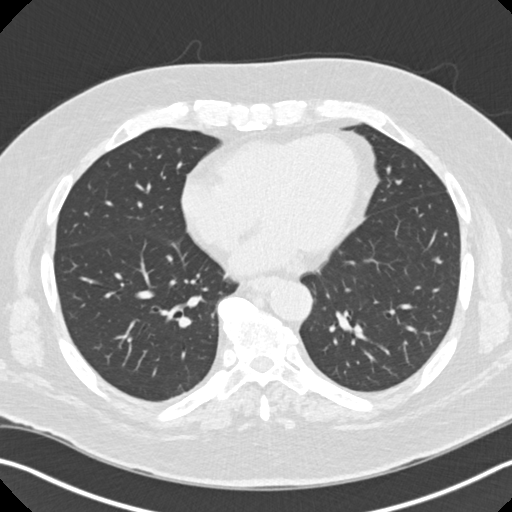
[im 99/183  lung]
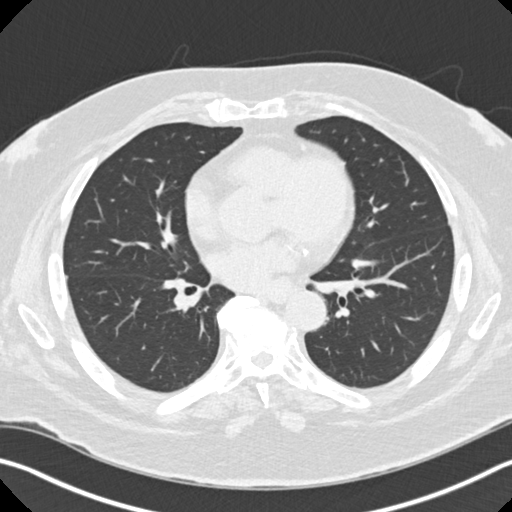
[im 113/183  lung]
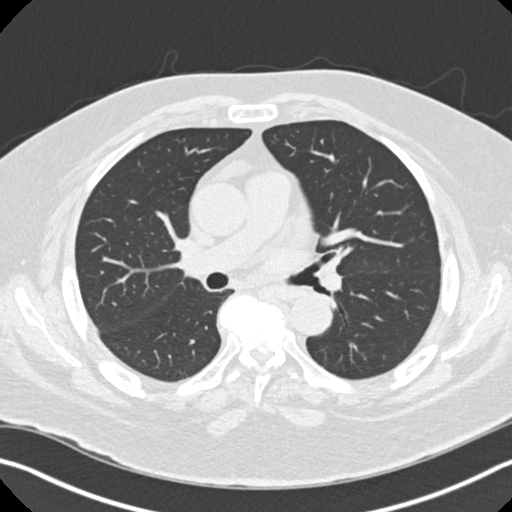
[im 127/183  mediastinal]
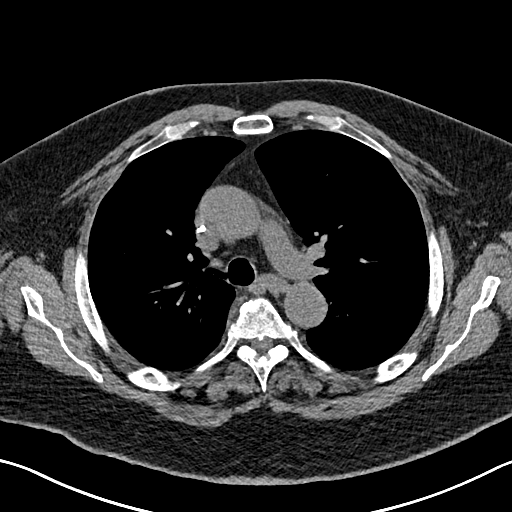
[im 127/183  lung]
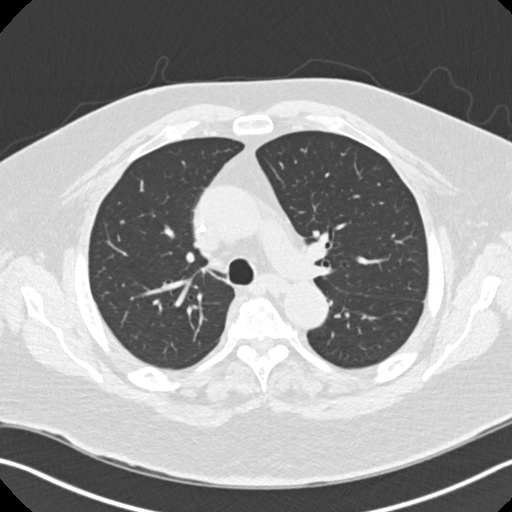
[im 141/183  lung]
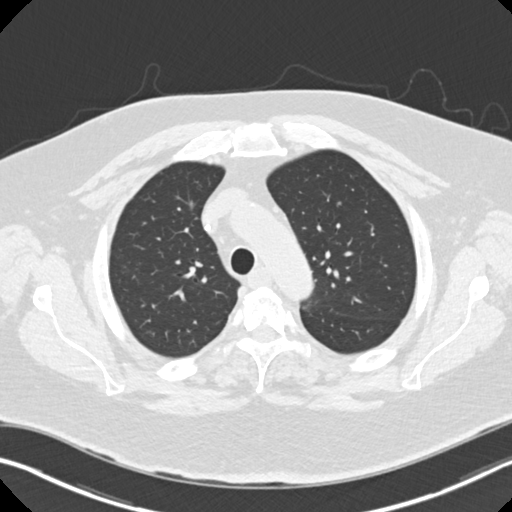
[im 155/183  lung]
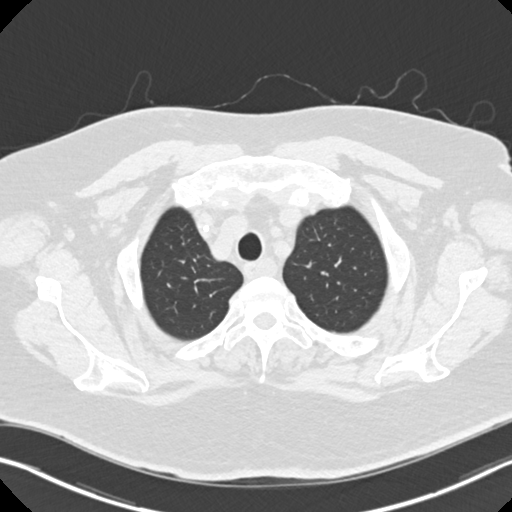
[im 169/183  lung]
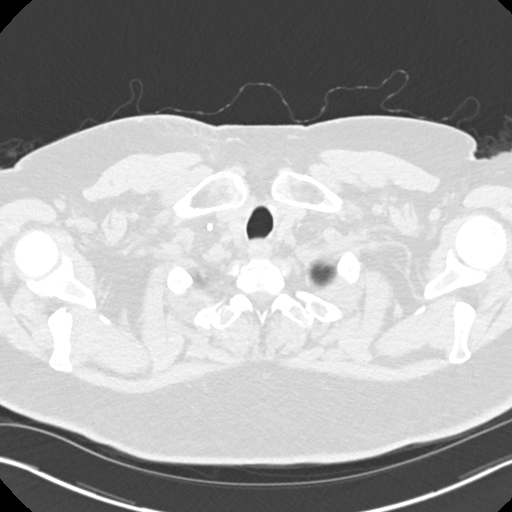

[Series 5: coronals chest 2.00 cor · coronal · 0.72mm/px · 3 of 158 slices shown]
[im 32/158  lung]
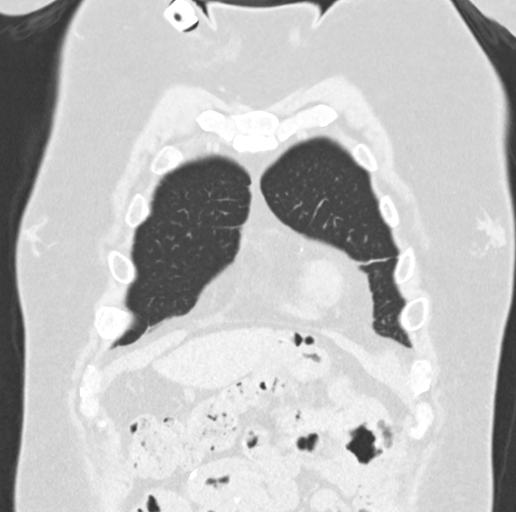
[im 63/158  lung]
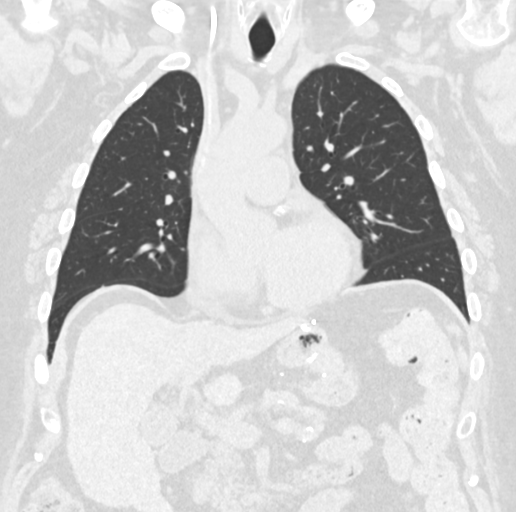
[im 95/158  lung]
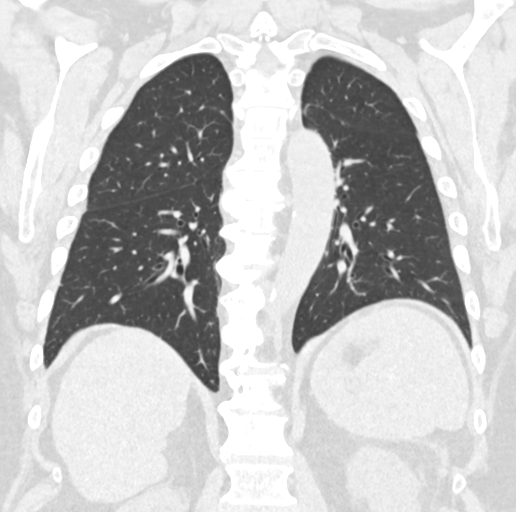

[15 of 36 positions shown; findings below may reference images not displayed]

FINDINGS: Cardiovascular: Right Port-A-Cath tip at low SVC. Aortic and branch
vessel atherosclerosis. Tortuous thoracic aorta. Borderline
cardiomegaly, without pericardial effusion. Multivessel coronary
artery atherosclerosis.

Mediastinum/Nodes: No supraclavicular adenopathy. No mediastinal or
definite hilar adenopathy, given limitations of unenhanced CT.

Lungs/Pleura: No pleural fluid. Accessory right upper lobe bronchus
including on 56/3. Clear lungs.

Upper Abdomen: Cirrhosis. Enlarged periumbilical vein. 4 mm
gallstone. Normal imaged portions of the spleen, adrenal glands,
kidneys. Status post gastric bypass.

Musculoskeletal: Moderate bilateral gynecomastia. Multifocal
sclerotic osseous metastasis. An index right eighth posterior right
rib lesion measures 3.0 cm on 87/2.

Posterior T7 vertebral body lesion measures 1.0 cm on sagittal image
92. The T6 vertebral body is diffusely sclerotic.
IMPRESSION: 1. Multifocal sclerotic osseous metastasis.
2. No evidence of soft tissue metastasis within the chest.
3. Cirrhosis.
4. Cholelithiasis.
5. Bilateral gynecomastia.
6. Coronary artery atherosclerosis. Aortic Atherosclerosis
([id number]).

## 2020-10-11 ENCOUNTER — Encounter: Payer: Self-pay | Admitting: Oncology

## 2020-10-15 ENCOUNTER — Inpatient Hospital Stay: Payer: Medicare HMO | Attending: Oncology

## 2020-10-15 ENCOUNTER — Encounter: Payer: Self-pay | Admitting: Oncology

## 2020-10-15 ENCOUNTER — Inpatient Hospital Stay: Payer: Medicare HMO

## 2020-10-15 ENCOUNTER — Inpatient Hospital Stay (HOSPITAL_BASED_OUTPATIENT_CLINIC_OR_DEPARTMENT_OTHER): Payer: Medicare HMO | Admitting: Oncology

## 2020-10-15 VITALS — BP 138/95 | HR 54 | Temp 96.3°F | Resp 20 | Wt 228.6 lb

## 2020-10-15 DIAGNOSIS — Z79899 Other long term (current) drug therapy: Secondary | ICD-10-CM | POA: Insufficient documentation

## 2020-10-15 DIAGNOSIS — C7951 Secondary malignant neoplasm of bone: Secondary | ICD-10-CM | POA: Diagnosis present

## 2020-10-15 DIAGNOSIS — D500 Iron deficiency anemia secondary to blood loss (chronic): Secondary | ICD-10-CM

## 2020-10-15 DIAGNOSIS — Z5181 Encounter for therapeutic drug level monitoring: Secondary | ICD-10-CM | POA: Diagnosis not present

## 2020-10-15 DIAGNOSIS — C61 Malignant neoplasm of prostate: Secondary | ICD-10-CM

## 2020-10-15 DIAGNOSIS — Z79818 Long term (current) use of other agents affecting estrogen receptors and estrogen levels: Secondary | ICD-10-CM | POA: Diagnosis not present

## 2020-10-15 DIAGNOSIS — Z5111 Encounter for antineoplastic chemotherapy: Secondary | ICD-10-CM | POA: Insufficient documentation

## 2020-10-15 LAB — CBC WITH DIFFERENTIAL/PLATELET
Abs Immature Granulocytes: 0 10*3/uL (ref 0.00–0.07)
Basophils Absolute: 0 10*3/uL (ref 0.0–0.1)
Basophils Relative: 1 %
Eosinophils Absolute: 0.1 10*3/uL (ref 0.0–0.5)
Eosinophils Relative: 3 %
HCT: 40.2 % (ref 39.0–52.0)
Hemoglobin: 13.7 g/dL (ref 13.0–17.0)
Immature Granulocytes: 0 %
Lymphocytes Relative: 31 %
Lymphs Abs: 0.9 10*3/uL (ref 0.7–4.0)
MCH: 32.2 pg (ref 26.0–34.0)
MCHC: 34.1 g/dL (ref 30.0–36.0)
MCV: 94.4 fL (ref 80.0–100.0)
Monocytes Absolute: 0.2 10*3/uL (ref 0.1–1.0)
Monocytes Relative: 5 %
Neutro Abs: 1.7 10*3/uL (ref 1.7–7.7)
Neutrophils Relative %: 60 %
Platelets: 106 10*3/uL — ABNORMAL LOW (ref 150–400)
RBC: 4.26 MIL/uL (ref 4.22–5.81)
RDW: 12.5 % (ref 11.5–15.5)
WBC: 2.8 10*3/uL — ABNORMAL LOW (ref 4.0–10.5)
nRBC: 0 % (ref 0.0–0.2)

## 2020-10-15 LAB — COMPREHENSIVE METABOLIC PANEL
ALT: 11 U/L (ref 0–44)
AST: 22 U/L (ref 15–41)
Albumin: 4.3 g/dL (ref 3.5–5.0)
Alkaline Phosphatase: 71 U/L (ref 38–126)
Anion gap: 8 (ref 5–15)
BUN: 14 mg/dL (ref 8–23)
CO2: 28 mmol/L (ref 22–32)
Calcium: 9 mg/dL (ref 8.9–10.3)
Chloride: 103 mmol/L (ref 98–111)
Creatinine, Ser: 1.21 mg/dL (ref 0.61–1.24)
GFR, Estimated: >60 mL/min (ref >60)
Glucose, Bld: 93 mg/dL (ref 70–99)
Potassium: 4.1 mmol/L (ref 3.5–5.1)
Sodium: 139 mmol/L (ref 135–145)
Total Bilirubin: 0.9 mg/dL (ref 0.3–1.2)
Total Protein: 6.8 g/dL (ref 6.5–8.1)

## 2020-10-15 LAB — PSA: Prostatic Specific Antigen: 0.39 ng/mL (ref 0.00–4.00)

## 2020-10-15 MED ORDER — LEUPROLIDE ACETATE (3 MONTH) 22.5 MG ~~LOC~~ KIT
22.5000 mg | PACK | Freq: Once | SUBCUTANEOUS | Status: AC
  Administered 2020-10-15: 22.5 mg via SUBCUTANEOUS
  Filled 2020-10-15: qty 22.5

## 2020-10-15 NOTE — Progress Notes (Signed)
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 He has not had any ascites or labs suggestive of chronic liver disease. He does not drink alcohol anymore. Patient was seen by scale for ongoing urinary symptoms of frequency especially at night. This was followed by a CT renal stone study which showed cyst in the upper pole of the right kidney as well as left kidney. S/p gastric bypass. Pelvic node enlargement in the right pelvis 11 mm. Bulky rounded lymph node along the external iliac chain 17 mm. Prostate was enlarged measuring 4.3 cm. Right obturator lymph node 1 cm.Sclerosis of L3. Left hemisacrum with sclerotic lesion. Sclerosis of the superior pubic rami. Large area of sclerosis affecting the right hemisacrum 4.7 x 2.4 cm. Left ninth and 11th rib with sclerotic lesions compatible with bone metastases. Right hip sclerosis. PSA was elevated at 18.6.  Patient underwent 12 core prostate biopsy which showed adenocarcinoma Gleason score 9(4+5)grade group 5.  Patient is currently on Lupron and finished 6 cycles of docetaxel chemotherapy In September 2021.   Interval history-patient reports some ongoing fatigue.   Denies any changes in his appetite.  Reports occasional pain in bilateral thighs.  ECOG PS- 1 Pain scale- 2 Opioid associated constipation- no  Review of systems- Review of Systems  Constitutional: Positive for malaise/fatigue. Negative for chills, fever and weight loss.  HENT: Negative for congestion, ear discharge and nosebleeds.   Eyes: Negative for blurred vision.  Respiratory: Negative for cough, hemoptysis, sputum production, shortness of breath and wheezing.   Cardiovascular: Negative for chest pain, palpitations, orthopnea and claudication.  Gastrointestinal: Negative for abdominal pain, blood in stool, constipation, diarrhea, heartburn, melena, nausea and vomiting.  Genitourinary: Negative for dysuria, flank pain, frequency, hematuria and urgency.  Musculoskeletal: Negative for back pain, joint pain and myalgias.  Skin: Negative for rash.  Neurological: Negative for dizziness, tingling, focal weakness, seizures, weakness and headaches.  Endo/Heme/Allergies: Does not bruise/bleed easily.  Psychiatric/Behavioral: Negative for depression and suicidal ideas. The patient does not have insomnia.       No Known Allergies   Past Medical History:  Diagnosis Date  . Anemia   . Arthritis    knees, shoulders  . CAD (coronary artery disease)    No Stents Present- per patient  . Cancer ([place])   . Cirrhosis ([place])    mild  . Colon polyps   . Essential hypertension 08/24/2008  . GERD (gastroesophageal reflux disease)    patient denies  . History of alcohol abuse Last Drink- 2011  . History of gout   . Hypertension    no longer on meds  . Hypothyroidism   . Intestinal ulcer   . Iron deficiency anemia due to chronic blood loss 02/27/2017  . Other pancytopenia ([place]) 02/25/2017  . Overactive bladder   . Sleep apnea    improved since gastric bypass  .  Thyroid disease      Past Surgical History:  Procedure Laterality Date  . BILATERAL TOTAL SHOULDER ARTHROPLASTY  1990's  .  CIRCUMCISION REVISION N/A 03/13/2017   Procedure: CIRCUMCISION REVISION;  Surgeon: Nickie Retort, MD;  Location: [facility];  Service: Urology;  Laterality: N/A;  . COLONOSCOPY WITH PROPOFOL N/A 10/30/2016   Procedure: COLONOSCOPY WITH PROPOFOL;  Surgeon: Lucilla Lame, MD;  Location: [facility];  Service: Endoscopy;  Laterality: N/A;  . ESOPHAGOGASTRODUODENOSCOPY (EGD) WITH PROPOFOL N/A 10/30/2016   Procedure: ESOPHAGOGASTRODUODENOSCOPY (EGD) WITH PROPOFOL;  Surgeon: Lucilla Lame, MD;  Location: [facility] END[facility];  Service: Endoscopy;  Laterality: N/A;  . ESOPHAGOGASTRODUODENOSCOPY (EGD) WITH PROPOFOL N/A 03/26/2017   Procedure: ESOPHAGOGASTRODUODENOSCOPY (EGD) WITH PROPOFOL;  Surgeon: Lucilla Lame, MD;  Location: [place];  Service: Gastroenterology;  Laterality: N/A;  requests early  . GASTRIC BYPASS  2016   Dr. Darnell Level- [facility] [place] ARTHROSCOPY  1990s  . KNEE ARTHROSCOPY W/ MENISCAL REPAIR Bilateral   . KNEE ARTHROSCOPY WITH MEDIAL MENISECTOMY Left 04/21/2019   Procedure: KNEE ARTHROSCOPY WITH MEDIAL MENISECTOMY;  Surgeon: Lovell Sheehan, MD;  Location: [place];  Service: Orthopedics;  Laterality: Left;  . PORTA CATH INSERTION N/A 11/23/2019   Procedure: PORTA CATH INSERTION;  Surgeon: Algernon Huxley, MD;  Location: [place] [facility];  Service: Cardiovascular;  Laterality: N/A;  . TONSILLECTOMY  2004    Social History   Socioeconomic History  . Marital status: Married    Spouse name: Not on file  . Number of children: Not on file  . Years of education: Not on file  . Highest education level: Not on file  Occupational History  . Not on file  Tobacco Use  . Smoking status: Never Smoker  . Smokeless tobacco: Current User    Types: Chew  Vaping Use  . Vaping Use: Never used  Substance and Sexual Activity  . Alcohol use: No    Comment: Heavy ETOH in past- Last Drink 2011 per patient  . Drug use: No  . Sexual activity: Not on file  Other Topics Concern  .  Not on file  Social History Narrative  . Not on file   Social Determinants of Health   Financial Resource Strain: Not on file  Food Insecurity: Not on file  Transportation Needs: Not on file  Physical Activity: Not on file  Stress: Not on file  Social Connections: Not on file  Intimate Partner Violence: Not on file    Family History  Problem Relation Age of Onset  . Stroke Mother   . Breast cancer Mother   . Heart disease Father   . Stroke Father   . AAA (abdominal aortic aneurysm) Father   . Lung cancer Paternal Grandmother   . Prostate cancer Brother   . Bladder Cancer Neg Hx   . Kidney cancer Neg Hx      Current Outpatient Medications:  .  levothyroxine (SYNTHROID, LEVOTHROID) 150 MCG tablet, Take 1 tablet (150 mcg total) by mouth daily before breakfast., Disp: 90 tablet, Rfl: 1 .  pantoprazole (PROTONIX) 40 MG tablet, Take 1 tablet (40 mg total) by mouth daily., Disp: 30 tablet, Rfl: 2 .  traZODone (DESYREL) 150 MG tablet, Take 4 tablets (600 mg total) by mouth at bedtime., Disp: 360 tablet, Rfl: 1 .  lactulose (CHRONULAC) 10 GM/15ML solution, TAKE 15MLS TWICE A DAY (Patient not taking: No sig reported), Disp: 950 mL, Rfl: 1 .  lidocaine-prilocaine (EMLA) cream, Apply to affected  area once (Patient not taking: No sig reported), Disp: 30 g, Rfl: 3 .  LORazepam (ATIVAN) 0.5 MG tablet, TAKE 1 TABLET BY MOUTH AT BEDTIME (Patient not taking: No sig reported), Disp: 30 tablet, Rfl: 0 .  ondansetron (ZOFRAN) 8 MG tablet, Take 1 tablet (8 mg total) by mouth 2 (two) times daily as needed for refractory nausea / vomiting. (Patient not taking: No sig reported), Disp: 30 tablet, Rfl: 1 .  prochlorperazine (COMPAZINE) 10 MG tablet, Take 1 tablet (10 mg total) by mouth every 6 (six) hours as needed (Nausea or vomiting). (Patient not taking: No sig reported), Disp: 30 tablet, Rfl: 1 .  vitamin B-12 (CYANOCOBALAMIN) 500 MCG tablet, Take 1,000 mcg by mouth daily.  (Patient not taking: No sig  reported), Disp: , Rfl:  No current facility-administered medications for this visit.  Facility-Administered Medications Ordered in Other Visits:  .  sodium chloride flush (NS) 0.9 % injection 10 mL, 10 mL, Intravenous, PRN, Sindy Guadeloupe, MD, 10 mL at 09/11/20 1138  Physical exam:  Vitals:   10/15/20 1006  BP: (!) 138/95  Pulse: (!) 54  Resp: 20  Temp: (!) 96.3 F (35.7 C)  TempSrc: Tympanic  SpO2: 100%  Weight: 228 lb 9.6 oz (103.7 kg)   Physical Exam Constitutional:      General: He is not in acute distress. Cardiovascular:     Rate and Rhythm: Normal rate and regular rhythm.     Heart sounds: Normal heart sounds.  Pulmonary:     Effort: Pulmonary effort is normal.     Breath sounds: Normal breath sounds.  Skin:    General: Skin is warm and dry.  Neurological:     Mental Status: He is alert and oriented to person, place, and time.      CMP Latest Ref Rng & Units 10/15/2020  Glucose 70 - 99 mg/dL 93  BUN 8 - 23 mg/dL 14  Creatinine 0.61 - 1.24 mg/dL 1.21  Sodium 135 - 145 mmol/L 139  Potassium 3.5 - 5.1 mmol/L 4.1  Chloride 98 - 111 mmol/L 103  CO2 22 - 32 mmol/L 28  Calcium 8.9 - 10.3 mg/dL 9.0  Total Protein 6.5 - 8.1 g/dL 6.8  Total Bilirubin 0.3 - 1.2 mg/dL 0.9  Alkaline Phos 38 - 126 U/L 71  AST 15 - 41 U/L 22  ALT 0 - 44 U/L 11   CBC Latest Ref Rng & Units 10/15/2020  WBC 4.0 - 10.5 K/uL 2.8(L)  Hemoglobin 13.0 - 17.0 g/dL 13.7  Hematocrit 39.0 - 52.0 % 40.2  Platelets 150 - 400 K/uL 106(L)      Assessment and plan- Patient is a 70 y.o. 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  Overall he has responded to treatment well.  PSA continues to be trending down.  Levels from today are pending but the last value was 0.44.Most recent CT chest abdomen and pelvis with contrast done in January showed resolved  airspace opacities.  No evidence of distant metastatic disease.  He will receive his Lupron today and I will see him back in 3 months with CBC with differential CMP PSA to receive his next dose of Lupron.  Based on today's PSA I will decide about when to get his next set of scans.  Baseline leukopenia/thrombocytopenia: This has been present even prior to starting chemotherapy.  Overall  stable.  Continue to monitor. Patient is agreeable to keep the port in place for now   Visit Diagnosis 1. Prostate cancer metastatic to bone ([place])   2. Encounter for monitoring Lupron therapy      Dr. Randa Evens, MD, MPH [facility] at [facility] [phone] 10/15/2020 12:54 PM

## 2020-10-29 ENCOUNTER — Emergency Department: Payer: Medicare HMO

## 2020-10-29 ENCOUNTER — Other Ambulatory Visit: Payer: Self-pay

## 2020-10-29 ENCOUNTER — Encounter: Payer: Self-pay | Admitting: Emergency Medicine

## 2020-10-29 ENCOUNTER — Emergency Department
Admission: EM | Admit: 2020-10-29 | Discharge: 2020-10-29 | Disposition: A | Discharge status: Home / Self Care | Payer: Medicare HMO | Attending: Emergency Medicine | Admitting: Emergency Medicine

## 2020-10-29 DIAGNOSIS — F17210 Nicotine dependence, cigarettes, uncomplicated: Secondary | ICD-10-CM | POA: Insufficient documentation

## 2020-10-29 DIAGNOSIS — E039 Hypothyroidism, unspecified: Secondary | ICD-10-CM | POA: Insufficient documentation

## 2020-10-29 DIAGNOSIS — C7951 Secondary malignant neoplasm of bone: Secondary | ICD-10-CM | POA: Diagnosis not present

## 2020-10-29 DIAGNOSIS — S2242XA Multiple fractures of ribs, left side, initial encounter for closed fracture: Secondary | ICD-10-CM

## 2020-10-29 DIAGNOSIS — S0990XA Unspecified injury of head, initial encounter: Secondary | ICD-10-CM | POA: Diagnosis not present

## 2020-10-29 DIAGNOSIS — K8020 Calculus of gallbladder without cholecystitis without obstruction: Secondary | ICD-10-CM | POA: Diagnosis not present

## 2020-10-29 DIAGNOSIS — S270XXA Traumatic pneumothorax, initial encounter: Secondary | ICD-10-CM | POA: Diagnosis not present

## 2020-10-29 DIAGNOSIS — Z79899 Other long term (current) drug therapy: Secondary | ICD-10-CM | POA: Insufficient documentation

## 2020-10-29 DIAGNOSIS — J90 Pleural effusion, not elsewhere classified: Secondary | ICD-10-CM | POA: Diagnosis not present

## 2020-10-29 DIAGNOSIS — F17220 Nicotine dependence, chewing tobacco, uncomplicated: Secondary | ICD-10-CM | POA: Diagnosis not present

## 2020-10-29 DIAGNOSIS — W108XXA Fall (on) (from) other stairs and steps, initial encounter: Secondary | ICD-10-CM | POA: Insufficient documentation

## 2020-10-29 DIAGNOSIS — Z8546 Personal history of malignant neoplasm of prostate: Secondary | ICD-10-CM | POA: Insufficient documentation

## 2020-10-29 DIAGNOSIS — C799 Secondary malignant neoplasm of unspecified site: Secondary | ICD-10-CM

## 2020-10-29 DIAGNOSIS — I2510 Atherosclerotic heart disease of native coronary artery without angina pectoris: Secondary | ICD-10-CM | POA: Diagnosis not present

## 2020-10-29 DIAGNOSIS — W19XXXA Unspecified fall, initial encounter: Secondary | ICD-10-CM

## 2020-10-29 DIAGNOSIS — S5002XA Contusion of left elbow, initial encounter: Secondary | ICD-10-CM | POA: Diagnosis not present

## 2020-10-29 DIAGNOSIS — S299XXA Unspecified injury of thorax, initial encounter: Secondary | ICD-10-CM | POA: Diagnosis present

## 2020-10-29 DIAGNOSIS — I10 Essential (primary) hypertension: Secondary | ICD-10-CM | POA: Diagnosis not present

## 2020-10-29 MED ORDER — HYDROCODONE-ACETAMINOPHEN 5-325 MG PO TABS: 2.0000 | ORAL_TABLET | Freq: Four times a day (QID) | ORAL | 0 refills | Status: DC | PRN

## 2020-10-29 MED ORDER — OXYCODONE-ACETAMINOPHEN 5-325 MG PO TABS
1.0000 | ORAL_TABLET | Freq: Once | ORAL | Status: AC
  Administered 2020-10-29: 1 via ORAL
  Filled 2020-10-29: qty 1

## 2020-10-29 MED ORDER — MORPHINE SULFATE (PF) 4 MG/ML IV SOLN
4.0000 mg | Freq: Once | INTRAVENOUS | Status: AC
  Administered 2020-10-29: 4 mg via INTRAVENOUS
  Filled 2020-10-29: qty 1

## 2020-10-29 NOTE — ED Triage Notes (Addendum)
Presents via EMS s/p fall   States fell down about 5 steps  Lost his balance  Having pain to left shoulder and mid back  Abrasion noted to elbow  No LOC

## 2020-10-29 NOTE — ED Notes (Signed)
Left elbow abrasion cleansed with surgical scrub and non stick dressing applied

## 2020-10-29 NOTE — ED Provider Notes (Signed)
[facility] Emergency Department Provider Note  ____________________________________________   Event Date/Time   First MD Initiated Contact with Patient 10/29/20 1410     (approximate)  I have reviewed the triage vital signs and the nursing notes.   HISTORY  Chief Complaint Fall (/)   HPI Dylan Ho is a 70 y.o. male with a past medical history of prostate cancer currently in remission under surveillance, HTN, GERD, hypothyroidism,, and remote tobacco abuse who presents for assessment after he states he fell down approximately 5 steps.  Patient states he typically uses a railing but did not hold on as he was walking down the steps.  Patient states he thinks he hit his head although currently does not have any headache neck pain and does not think he is on any blood thinners.  He states most of his weight fell onto his left shoulder and left chest reexperiencing with his pain.  He denies any pain in his right shoulder, right elbow, right wrist, bilateral hips, knees, ankles, back or abdomen.  States he got a bruise over his left elbow but does not currently have any significant pain at left elbow joint or in the wrist.  States he is otherwise been in his usual state health without any recent fevers, chills, cough, nausea, vomiting, diarrhea or dysuria, rash or other recent falls or injuries.  No other acute concerns at this time.         Past Medical History:  Diagnosis Date  . Anemia   . Arthritis    knees, shoulders  . CAD (coronary artery disease)    No Stents Present- per patient  . Cancer ([place])   . Cirrhosis ([place])    mild  . Colon polyps   . Essential hypertension 08/24/2008  . GERD (gastroesophageal reflux disease)    patient denies  . History of alcohol abuse Last Drink- 2011  . History of gout   . Hypertension    no longer on meds  . Hypothyroidism   . Intestinal ulcer   . Iron deficiency anemia due to chronic blood loss 02/27/2017  .  Other pancytopenia ([place]) 02/25/2017  . Overactive bladder   . Sleep apnea    improved since gastric bypass  . Thyroid disease     Patient Active Problem List   Diagnosis Date Noted  . Prostate cancer metastatic to bone ([place]) 11/16/2019  . Goals of care, counseling/discussion 11/16/2019  . Constipation 07/09/2017  . Impingement syndrome of right shoulder region 05/13/2017  . Iron deficiency anemia due to chronic blood loss 02/27/2017  . B12 deficiency 01/09/2017  . 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 Osteoarthritis of both knees 01/09/2017  . Insomnia 12/12/2016  . Hx of gastric bypass 12/12/2016  . Cirrhosis of liver ([place]) 11/19/2016  . Positive Helicobacter pylori serology 11/19/2016  . Sleep apnea 11/19/2016  . Von Willebrand disease ([place]) 11/19/2016  . Anastomotic ulcer S/P gastric bypass   . Obesity (BMI 30.0-34.9) 01/19/2014  . Nondependent alcohol abuse 01/11/2010  . Hypothyroidism 01/11/2010  . Hyperlipidemia 01/11/2010  . Coronary atherosclerosis of native coronary artery 09/22/2008    Past Surgical History:  Procedure Laterality Date  . BILATERAL TOTAL SHOULDER ARTHROPLASTY  1990's  . CIRCUMCISION REVISION N/A 03/13/2017   Procedure: CIRCUMCISION REVISION;  Surgeon: Nickie Retort, MD;  Location: [facility] ORS;  Service: Urology;  Laterality: N/A;  . COLONOSCOPY WITH PROPOFOL N/A 10/30/2016   Procedure: COLONOSCOPY WITH PROPOFOL;  Surgeon: Lucilla Lame, MD;  Location: [facility]  ENDOSCOPY;  Service: Endoscopy;  Laterality: N/A;  . ESOPHAGOGASTRODUODENOSCOPY (EGD) WITH PROPOFOL N/A 10/30/2016   Procedure: ESOPHAGOGASTRODUODENOSCOPY (EGD) WITH PROPOFOL;  Surgeon: Lucilla Lame, MD;  Location: [facility] ENDOSCOPY;  Service: Endoscopy;  Laterality: N/A;  . ESOPHAGOGASTRODUODENOSCOPY (EGD) WITH PROPOFOL N/A 03/26/2017   Procedure: ESOPHAGOGASTRODUODENOSCOPY (EGD) WITH PROPOFOL;  Surgeon: Lucilla Lame, MD;  Location: [place];  Service: Gastroenterology;  Laterality: N/A;  requests  early  . GASTRIC BYPASS  2016   Dr. Darnell Level- [facility], [place] ARTHROSCOPY  1990s  . KNEE ARTHROSCOPY W/ MENISCAL REPAIR Bilateral   . KNEE ARTHROSCOPY WITH MEDIAL MENISECTOMY Left 04/21/2019   Procedure: KNEE ARTHROSCOPY WITH MEDIAL MENISECTOMY;  Surgeon: Lovell Sheehan, MD;  Location: [place];  Service: Orthopedics;  Laterality: Left;  . PORTA CATH INSERTION N/A 11/23/2019   Procedure: PORTA CATH INSERTION;  Surgeon: Algernon Huxley, MD;  Location: [place] CV [facility];  Service: Cardiovascular;  Laterality: N/A;  . TONSILLECTOMY  2004    Prior to Admission medications   Medication Sig Start Date End Date Taking? Authorizing Provider  HYDROcodone-acetaminophen (NORCO) 5-325 MG tablet Take 2 tablets by mouth every 6 (six) hours as needed for up to 5 days for severe pain. 10/29/20 11/03/20 Yes Lucrezia Starch, MD  levothyroxine (SYNTHROID, LEVOTHROID) 150 MCG tablet Take 1 tablet (150 mcg total) by mouth daily before breakfast. 05/17/18   Glean Hess, MD  lidocaine-prilocaine (EMLA) cream Apply to affected area once Patient not taking: No sig reported 11/16/19   Sindy Guadeloupe, MD  pantoprazole (PROTONIX) 40 MG tablet Take 1 tablet (40 mg total) by mouth daily. 07/17/20   Sindy Guadeloupe, MD  traZODone (DESYREL) 150 MG tablet Take 4 tablets (600 mg total) by mouth at bedtime. 05/17/18   Glean Hess, MD  prochlorperazine (COMPAZINE) 10 MG tablet Take 1 tablet (10 mg total) by mouth every 6 (six) hours as needed (Nausea or vomiting). Patient not taking: No sig reported 11/16/19 10/29/20  Sindy Guadeloupe, MD    Allergies Patient has no known allergies.  Family History  Problem Relation Age of Onset  . Stroke Mother   . Breast cancer Mother   . Heart disease Father   . Stroke Father   . AAA (abdominal aortic aneurysm) Father   . Lung cancer Paternal Grandmother   . Prostate cancer Brother   . Bladder Cancer Neg Hx   . Kidney cancer Neg Hx     Social History Social  History   Tobacco Use  . Smoking status: Never Smoker  . Smokeless tobacco: Current User    Types: Chew  Vaping Use  . Vaping Use: Never used  Substance Use Topics  . Alcohol use: No    Comment: Heavy ETOH in past- Last Drink 2011 per patient  . Drug use: No    Review of Systems  Review of Systems  Constitutional: Negative for chills and fever.  HENT: Negative for sore throat.   Eyes: Negative for pain.  Respiratory: Negative for cough and stridor.   Cardiovascular: Negative for chest pain.  Gastrointestinal: Negative for vomiting.  Genitourinary: Negative for dysuria.  Musculoskeletal: Positive for joint pain ( L shoulder) and myalgias ( L chest, L shoulder).  Skin: Negative for rash.  Neurological: Negative for seizures, loss of consciousness and headaches.  Psychiatric/Behavioral: Negative for suicidal ideas.  All other systems reviewed and are negative.     ____________________________________________   PHYSICAL EXAM:  VITAL SIGNS: ED Triage Vitals  Enc Vitals Group     BP 10/29/20 1354 120/62     Pulse Rate 10/29/20 1354 62     Resp 10/29/20 1354 18     Temp 10/29/20 1354 98 F (36.7 C)     Temp Source 10/29/20 1354 Oral     SpO2 10/29/20 1354 98 %     Weight 10/29/20 1351 215 lb (97.5 kg)     Height 10/29/20 1351 5\' 11"  (1.803 m)     Head Circumference --      Peak Flow --      Pain Score 10/29/20 1351 10     Pain Loc --      Pain Edu? --      Excl. in [place]? --    Vitals:   10/29/20 1354  BP: 120/62  Pulse: 62  Resp: 18  Temp: 98 F (36.7 C)  SpO2: 98%   Physical Exam Vitals and nursing note reviewed.  Constitutional:      Appearance: He is well-developed.  HENT:     Head: Normocephalic and atraumatic.     Right Ear: External ear normal.     Left Ear: External ear normal.     Nose: Nose normal.  Eyes:     Conjunctiva/sclera: Conjunctivae normal.  Cardiovascular:     Rate and Rhythm: Normal rate and regular rhythm.     Heart sounds: No  murmur heard.   Pulmonary:     Effort: Pulmonary effort is normal. No respiratory distress.     Breath sounds: Normal breath sounds.  Abdominal:     Palpations: Abdomen is soft.     Tenderness: There is no abdominal tenderness.  Musculoskeletal:     Cervical back: Neck supple.  Skin:    General: Skin is warm and dry.  Neurological:     Mental Status: He is alert and oriented to person, place, and time.  Psychiatric:        Mood and Affect: Mood normal.     Cranial nerves II through XII grossly intact.  No obvious trauma to the face scalp head or neck.  There is some tenderness over the posterior and lateral left shoulder with a large effusion or deformity.  Patient has full range of motion and no point tenderness anywhere over the left elbow or the left wrist and there is no obvious evidence of trauma to the right upper extremity or the lower extremities.  2+ bilateral radial pulses.  Sensation intact in the distribution of ulnar radial and median nerves in the bilateral upper extremities.  There is a skin tear over the left elbow that is superficial hemostatic.  Significant tenderness over the left chest.  Abdomen is soft nontender throughout. ____________________________________________   LABS (all labs ordered are listed, but only abnormal results are displayed)  Labs Reviewed - No data to display ____________________________________________  EKG ____________________________________________  RADIOLOGY  ED MD interpretation: CT head and neck showed no acute intracranial hemorrhage skull fracture or acute C-spine injury.  There is evidence of a left apical pneumothorax on CT C-spine.  Chest x-ray has some left-sided rib fractures but no obvious pneumothorax on chest x-ray.  No fracture dislocation of the left shoulder.  CT chest remarkable for left apical pneumothorax and multiple left-sided rib fractures with small left pleural effusion and extensive osseous mets with some  cirrhosis and cholelithiasis without other clear acute thoracic process.   Official radiology report(s): DG Chest 2 View  Result Date: 10/29/2020 CLINICAL DATA:  Fall EXAM:  CHEST - 2 VIEW COMPARISON:  03/15/2016 FINDINGS: Cardiac and mediastinal contours normal.  Vascularity normal. Mild left lower lobe airspace disease likely atelectasis. Right lung clear. No effusion. Port-A-Cath tip mid SVC. Probable multiple left rib fractures. IMPRESSION: Mild left lower lobe airspace disease likely atelectasis. Probable multiple left rib fractures which may be acute. Electronically Signed   By: Franchot Gallo M.D.   On: 10/29/2020 15:13   CT Head Wo Contrast  Result Date: 10/29/2020 CLINICAL DATA:  70 year old male with neck trauma. EXAM: CT HEAD WITHOUT CONTRAST CT CERVICAL SPINE WITHOUT CONTRAST TECHNIQUE: Multidetector CT imaging of the head and cervical spine was performed following the standard protocol without intravenous contrast. Multiplanar CT image reconstructions of the cervical spine were also generated. COMPARISON:  Head CT dated 08/23/2019. FINDINGS: CT HEAD FINDINGS Brain: The ventricles and sulci appropriate size for patient's age. The gray-white matter discrimination is preserved. There is no acute intracranial hemorrhage. No mass effect or midline shift. No extra-axial fluid collection. Vascular: No hyperdense vessel or unexpected calcification. Skull: Normal. Negative for fracture or focal lesion. Sinuses/Orbits: No acute finding. Other: None CT CERVICAL SPINE FINDINGS Alignment: No acute subluxation. Skull base and vertebrae: No acute fracture. Scattered sclerotic lesions involving C1, C3, and C7 and T2 consistent with metastatic disease and in keeping with known prostate cancer. Soft tissues and spinal canal: No prevertebral fluid or swelling. No visible canal hematoma. Disc levels: Mild degenerative changes and spurring. No acute findings. Upper chest: The visualized lung apices are clear.  Partially visualized right IJ central venous line. Probable tiny left apical pneumothorax measuring approximately 4 mm in thickness. Other: Bilateral carotid bulb calcified plaque. IMPRESSION: 1. No acute intracranial pathology. 2. No acute/traumatic cervical spine pathology. 3. Osseous metastatic disease. 4. Probable tiny left apical pneumothorax. These results were called by telephone at the time of interpretation on 10/29/2020 at 3:31 pm to provider [facility] , who verbally acknowledged these results. Electronically Signed   By: Anner Crete M.D.   On: 10/29/2020 15:40   CT Chest Wo Contrast  Result Date: 10/29/2020 CLINICAL DATA:  Left shoulder and back pain after a fall. Prostate cancer pre EXAM: CT CHEST WITHOUT CONTRAST TECHNIQUE: Multidetector CT imaging of the chest was performed following the standard protocol without IV contrast. COMPARISON:  Chest radiograph of earlier today.  Chest CT of 110 22. FINDINGS: Cardiovascular: Aortic atherosclerosis. Normal heart size with minimal pericardial fluid, likely physiologic. Multivessel coronary artery atherosclerosis. Right Port-A-Cath with tip at mid SVC. No evidence of aortic injury or mediastinal hematoma. Mediastinum/Nodes: No mediastinal or definite hilar adenopathy, given limitations of unenhanced CT. Lungs/Pleura: Small left pleural effusion is similar. Development of a small left-sided pneumothorax, primarily anteriorly. Visceral pleural line up to 1.5 cm from chest wall on 117/3. Scattered areas of scarring bilaterally. The residual left lower lobe nodular density on 07/16/2020 has nearly completely resolved, with only peribronchovascular interstitial thickening remaining on 81/3. Upper Abdomen: Advanced cirrhosis. Dependent gallstone. Normal imaged portions of the spleen, adrenal glands, left kidney. Pancreatic atrophy. Status post gastric bypass. Musculoskeletal: Moderate bilateral gynecomastia. Widespread sclerotic osseous metastasis.  Anterolateral left fourth rib fracture is minimally displaced on 67/2. Nondisplaced fifth and sixth left rib fractures. Minimally displaced lateral left seventh rib fracture. Ninth posterior left rib nondisplaced fracture including on 92/2. IMPRESSION: 1. Left-sided rib fractures and small anterior left pneumothorax. 2. Chronic small volume left-sided pleural fluid. 3. Extensive osseous metastasis, as before. 4. Cirrhosis. 5. Cholelithiasis. Electronically Signed   By: Abigail Miyamoto  M.D.   On: 10/29/2020 16:42   CT Cervical Spine Wo Contrast  Result Date: 10/29/2020 CLINICAL DATA:  70 year old male with neck trauma. EXAM: CT HEAD WITHOUT CONTRAST CT CERVICAL SPINE WITHOUT CONTRAST TECHNIQUE: Multidetector CT imaging of the head and cervical spine was performed following the standard protocol without intravenous contrast. Multiplanar CT image reconstructions of the cervical spine were also generated. COMPARISON:  Head CT dated 08/23/2019. FINDINGS: CT HEAD FINDINGS Brain: The ventricles and sulci appropriate size for patient's age. The gray-white matter discrimination is preserved. There is no acute intracranial hemorrhage. No mass effect or midline shift. No extra-axial fluid collection. Vascular: No hyperdense vessel or unexpected calcification. Skull: Normal. Negative for fracture or focal lesion. Sinuses/Orbits: No acute finding. Other: None CT CERVICAL SPINE FINDINGS Alignment: No acute subluxation. Skull base and vertebrae: No acute fracture. Scattered sclerotic lesions involving C1, C3, and C7 and T2 consistent with metastatic disease and in keeping with known prostate cancer. Soft tissues and spinal canal: No prevertebral fluid or swelling. No visible canal hematoma. Disc levels: Mild degenerative changes and spurring. No acute findings. Upper chest: The visualized lung apices are clear. Partially visualized right IJ central venous line. Probable tiny left apical pneumothorax measuring approximately 4 mm  in thickness. Other: Bilateral carotid bulb calcified plaque. IMPRESSION: 1. No acute intracranial pathology. 2. No acute/traumatic cervical spine pathology. 3. Osseous metastatic disease. 4. Probable tiny left apical pneumothorax. These results were called by telephone at the time of interpretation on 10/29/2020 at 3:31 pm to provider [facility] , who verbally acknowledged these results. Electronically Signed   By: Anner Crete M.D.   On: 10/29/2020 15:40   DG Shoulder Left  Result Date: 10/29/2020 CLINICAL DATA:  Post fall. EXAM: LEFT SHOULDER - 2+ VIEW COMPARISON:  None. FINDINGS: There is no evidence of fracture or dislocation. Mild acromioclavicular degenerative change. Small subacromial spur. Soft tissues are unremarkable. Mildly displaced fracture of left lateral fourth rib and nondisplaced fracture of left lateral fifth rib, only partially included in the field of view. IMPRESSION: 1. No fracture or dislocation of the left shoulder. 2. Left lateral fourth and fifth rib fractures, only partially included in the field of view. Electronically Signed   By: Keith Rake M.D.   On: 10/29/2020 15:15    ____________________________________________   PROCEDURES  Procedure(s) performed (including Critical Care):  .1-3 Lead EKG Interpretation Performed by: Lucrezia Starch, MD Authorized by: Lucrezia Starch, MD     Interpretation: normal     ECG rate assessment: normal     Rhythm: sinus rhythm     Ectopy: none     Conduction: normal       ____________________________________________   INITIAL IMPRESSION / ASSESSMENT AND PLAN / ED COURSE      Patient presents with above-stated history exam after fall as described above.  On arrival he is afebrile and hemodynamically stable.  He does have some tenderness over his left shoulder and over his left chest.  Given age and patient reporting he thinks he may hit his head CT head and C-spine obtained which does not show any evidence  of skull fracture intracranial hemorrhage or acute C-spine injury.  However while patient's chest x-ray and left shoulder x-ray did not show pneumothorax was noted on a CT C-spine and CT chest was obtained.  CT chest remarkable for left apical pneumothorax and multiple left-sided rib fractures with small left pleural effusion and extensive osseous mets with some cirrhosis and cholelithiasis without other clear  acute thoracic process.   Patient given below noted analgesia and incentive spirometer. IS is 1750 I the ED. on reassessment patient stated his pain had nearly resolved and not feeling shortness of breath.  In addition of approximately 3 and half hours emergency room he never developed any tachypnea or hypoxia tachycardia or hypotension or other evidence of respiratory distress.  Discussed finding of pneumothorax likely traumatic with left-sided rib fractures with on-call CT surgeon Dr. Kipp Brood who agreed with outpatient follow-up and For 48 hours for repeat chest x-ray.  Rx written for Norco.  Explained plan for repeat chest x-ray in 24 to 48 hours to be coordinated by CT surgery clinic.  Patient and wife voiced understanding and agreement to plan.  Discharged in stable condition.  Strict return precautions advised and discussed.  Suspicion for other significant injury at this time.  Also advised that metastatic lesions were seen on his CT and that he should discuss these with his oncologist to see if any additional diagnostic studies were indicated.    ____________________________________________   FINAL CLINICAL IMPRESSION(S) / ED DIAGNOSES  Final diagnoses:  Fall, initial encounter  Closed fracture of multiple ribs of left side, initial encounter  Traumatic pneumothorax, initial encounter  Calculus of gallbladder without cholecystitis without obstruction  Metastatic malignant neoplasm, unspecified site ([place])  Pleural effusion on left    Medications  morphine 4 MG/ML injection 4 mg  (4 mg Intravenous Given 10/29/20 1514)  oxyCODONE-acetaminophen (PERCOCET/ROXICET) 5-325 MG per tablet 1 tablet (1 tablet Oral Given 10/29/20 1554)     ED Discharge Orders         Ordered    HYDROcodone-acetaminophen (NORCO) 5-325 MG tablet  Every 6 hours PRN        10/29/20 1717           Note:  This document was prepared using Dragon voice recognition software and may include unintentional dictation errors.   Lucrezia Starch, MD 10/29/20 1739

## 2020-10-30 NOTE — Telephone Encounter (Signed)
Can you speak to him?

## 2020-10-30 NOTE — Telephone Encounter (Signed)
Called the pt and he wanted you to know what happened. He was going down steps at house and thinks he missed a step and started going down the steps and went to right and hit the brick and then went to ER and found out cracked ribs and pneumothorax and lots of pain. The scan did talk about mets and he wanted dr Janese Banks to make sure he has not affected his cancer with this. I told him that the things she saw was previous cancer and with scan and psa she feels like he is doing good  from prostate cancer. But will need time for him to get better with the rib fractures and will nee f/u about pneumothorax. He was appreciable for calling and checking with him.

## 2020-10-31 ENCOUNTER — Encounter: Payer: Self-pay | Admitting: Thoracic Surgery (Cardiothoracic Vascular Surgery)

## 2020-10-31 ENCOUNTER — Other Ambulatory Visit: Payer: Self-pay | Admitting: Thoracic Surgery (Cardiothoracic Vascular Surgery)

## 2020-10-31 ENCOUNTER — Ambulatory Visit
Admission: RE | Admit: 2020-10-31 | Discharge: 2020-10-31 | Disposition: A | Discharge status: Home / Self Care | Payer: Medicare HMO | Source: Ambulatory Visit | Attending: Thoracic Surgery (Cardiothoracic Vascular Surgery) | Admitting: Thoracic Surgery (Cardiothoracic Vascular Surgery)

## 2020-10-31 ENCOUNTER — Institutional Professional Consult (permissible substitution): Payer: Medicare HMO | Admitting: Thoracic Surgery (Cardiothoracic Vascular Surgery)

## 2020-10-31 ENCOUNTER — Other Ambulatory Visit: Payer: Self-pay

## 2020-10-31 VITALS — BP 118/82 | HR 67 | Resp 20 | Ht 71.0 in

## 2020-10-31 DIAGNOSIS — S2249XB Multiple fractures of ribs, unspecified side, initial encounter for open fracture: Secondary | ICD-10-CM

## 2020-10-31 DIAGNOSIS — S2232XA Fracture of one rib, left side, initial encounter for closed fracture: Secondary | ICD-10-CM | POA: Diagnosis not present

## 2020-10-31 DIAGNOSIS — J9311 Primary spontaneous pneumothorax: Secondary | ICD-10-CM

## 2020-10-31 MED ORDER — HYDROCODONE-ACETAMINOPHEN 5-325 MG PO TABS: 1.0000 | ORAL_TABLET | Freq: Four times a day (QID) | ORAL | 0 refills | Status: AC | PRN

## 2020-10-31 NOTE — Progress Notes (Signed)
[address]       [place] [address]             [phone]                    Dylan Ho [facility] Medical Record #[id number] Date of Birth: April 24, 1951  Referring: Lucrezia Starch, MD Primary Care: Kirk Ruths, MD Primary Cardiologist: No primary care provider on file.  Chief Complaint:    Chief Complaint  Patient presents with  . Rib Fracture    Surgical consult, CXR- Today, Chest CT 10/29/20    History of Present Illness:    Dylan Ho 70 y.o. male presents for evaluation of multiple right sided rib fractures and pneumothorax after sustaining a ground-level fall.  He was originally evaluated in the emergency department at [facility] on the 25th of this month.  Since being discharged she does have some left-sided chest wall pain but denies any significant shortness of breath or pleuritic symptoms.  He does present today in a wheelchair but has been ambulatory at home.    Past Medical History:  Diagnosis Date  . Anemia   . Arthritis    knees, shoulders  . CAD (coronary artery disease)    No Stents Present- per patient  . Cancer ([place])   . Cirrhosis ([place])    mild  . Colon polyps   . Essential hypertension 08/24/2008  . GERD (gastroesophageal reflux disease)    patient denies  . History of alcohol abuse Last Drink- 2011  . History of gout   . Hypertension    no longer on meds  . Hypothyroidism   . Intestinal ulcer   . Iron deficiency anemia due to chronic blood loss 02/27/2017  . Other pancytopenia ([place]) 02/25/2017  . Overactive bladder   . Sleep apnea    improved since gastric bypass  . Thyroid disease     Past Surgical History:  Procedure Laterality Date  . BILATERAL TOTAL SHOULDER ARTHROPLASTY  1990's  . CIRCUMCISION REVISION N/A 03/13/2017   Procedure: CIRCUMCISION REVISION;  Surgeon: Nickie Retort, MD;  Location: [facility] ORS;  Service: Urology;  Laterality: N/A;  . COLONOSCOPY WITH PROPOFOL N/A  10/30/2016   Procedure: COLONOSCOPY WITH PROPOFOL;  Surgeon: Lucilla Lame, MD;  Location: [facility] ENDOSCOPY;  Service: Endoscopy;  Laterality: N/A;  . ESOPHAGOGASTRODUODENOSCOPY (EGD) WITH PROPOFOL N/A 10/30/2016   Procedure: ESOPHAGOGASTRODUODENOSCOPY (EGD) WITH PROPOFOL;  Surgeon: Lucilla Lame, MD;  Location: [facility];  Service: Endoscopy;  Laterality: N/A;  . ESOPHAGOGASTRODUODENOSCOPY (EGD) WITH PROPOFOL N/A 03/26/2017   Procedure: ESOPHAGOGASTRODUODENOSCOPY (EGD) WITH PROPOFOL;  Surgeon: Lucilla Lame, MD;  Location: [facility];  Service: Gastroenterology;  Laterality: N/A;  requests early  . GASTRIC BYPASS  2016   Dr. Darnell Level- [facility] [place] ARTHROSCOPY  1990s  . KNEE ARTHROSCOPY W/ MENISCAL REPAIR Bilateral   . KNEE ARTHROSCOPY WITH MEDIAL MENISECTOMY Left 04/21/2019   Procedure: KNEE ARTHROSCOPY WITH MEDIAL MENISECTOMY;  Surgeon: Lovell Sheehan, MD;  Location: [facility];  Service: Orthopedics;  Laterality: Left;  . PORTA CATH INSERTION N/A 11/23/2019   Procedure: PORTA CATH INSERTION;  Surgeon: Algernon Huxley, MD;  Location: [facility];  Service: Cardiovascular;  Laterality: N/A;  . TONSILLECTOMY  2004    Family History  Problem Relation Age of Onset  . Stroke Mother   . Breast cancer Mother   . Heart disease Father   .  Stroke Father   . AAA (abdominal aortic aneurysm) Father   . Lung cancer Paternal Grandmother   . Prostate cancer Brother   . Bladder Cancer Neg Hx   . Kidney cancer Neg Hx      Social History   Tobacco Use  Smoking Status Never Smoker  Smokeless Tobacco Current User  . Types: Chew    Social History   Substance and Sexual Activity  Alcohol Use No   Comment: Heavy ETOH in past- Last Drink 2011 per patient     No Known Allergies  Current Outpatient Medications  Medication Sig Dispense Refill  . levothyroxine (SYNTHROID, LEVOTHROID) 150 MCG tablet Take 1 tablet (150 mcg total) by mouth daily before breakfast. 90 tablet 1   . pantoprazole (PROTONIX) 40 MG tablet Take 1 tablet (40 mg total) by mouth daily. 30 tablet 2  . traZODone (DESYREL) 150 MG tablet Take 4 tablets (600 mg total) by mouth at bedtime. 360 tablet 1  . HYDROcodone-acetaminophen (NORCO) 5-325 MG tablet Take 1-2 tablets by mouth every 6 (six) hours as needed for up to 5 days for severe pain. 15 tablet 0  . lidocaine-prilocaine (EMLA) cream Apply to affected area once (Patient not taking: Reported on 10/31/2020) 30 g 3   No current facility-administered medications for this visit.   Facility-Administered Medications Ordered in Other Visits  Medication Dose Route Frequency Provider Last Rate Last Admin  . sodium chloride flush (NS) 0.9 % injection 10 mL  10 mL Intravenous PRN Sindy Guadeloupe, MD   10 mL at 09/11/20 1138    Review of Systems  Constitutional: Negative.   Respiratory: Positive for shortness of breath.   Cardiovascular: Positive for chest pain.  Musculoskeletal: Positive for back pain, falls and myalgias.  Neurological: Negative.     PHYSICAL EXAMINATION: BP 118/82 (BP Location: Left Arm, Patient Position: Sitting)   Pulse 67   Resp 20   Ht 5\' 11"  (1.803 m)   SpO2 90% Comment: RA  BMI 29.99 kg/m   Physical Exam Constitutional:      General: He is not in acute distress.    Appearance: Normal appearance. He is obese. He is not ill-appearing or diaphoretic.  HENT:     Head: Normocephalic and atraumatic.  Eyes:     Extraocular Movements: Extraocular movements intact.  Cardiovascular:     Rate and Rhythm: Normal rate.  Pulmonary:     Effort: Respiratory distress present.  Abdominal:     General: There is no distension.  Musculoskeletal:        General: No deformity.     Cervical back: Normal range of motion.  Skin:    General: Skin is warm and dry.  Neurological:     General: No focal deficit present.     Mental Status: He is alert and oriented to person, place, and time.      Diagnostic Studies & Laboratory  data:     Recent Radiology Findings:   DG Chest 2 View  Result Date: 10/31/2020 CLINICAL DATA:  Rib fractures.  Fall.  Left-sided pain. EXAM: CHEST - 2 VIEW COMPARISON:  CT of chest 8/that CT chest 10/29/2020 FINDINGS: Nondisplaced left-sided rib fractures not clearly seen. Moderate left-sided pleural effusion present. No significant pneumothorax. There is subcutaneous gas along the left chest wall indicating a pleural leak. Right IJ Port-A-Cath stable. Heart size is mildly enlarged, exaggerated by low lung volumes. The right lung is clear. IMPRESSION: 1. Moderate left pleural effusion without significant pneumothorax. 2.  Subcutaneous gas along the left chest wall indicating a pleural leak. 3. Left-sided rib fractures not clearly seen. Electronically Signed   By: San Morelle M.D.   On: 10/31/2020 13:24   DG Chest 2 View  Result Date: 10/29/2020 CLINICAL DATA:  Fall EXAM: CHEST - 2 VIEW COMPARISON:  03/15/2016 FINDINGS: Cardiac and mediastinal contours normal.  Vascularity normal. Mild left lower lobe airspace disease likely atelectasis. Right lung clear. No effusion. Port-A-Cath tip mid SVC. Probable multiple left rib fractures. IMPRESSION: Mild left lower lobe airspace disease likely atelectasis. Probable multiple left rib fractures which may be acute. Electronically Signed   By: Franchot Gallo M.D.   On: 10/29/2020 15:13   CT Head Wo Contrast  Result Date: 10/29/2020 CLINICAL DATA:  70 year old male with neck trauma. EXAM: CT HEAD WITHOUT CONTRAST CT CERVICAL SPINE WITHOUT CONTRAST TECHNIQUE: Multidetector CT imaging of the head and cervical spine was performed following the standard protocol without intravenous contrast. Multiplanar CT image reconstructions of the cervical spine were also generated. COMPARISON:  Head CT dated 08/23/2019. FINDINGS: CT HEAD FINDINGS Brain: The ventricles and sulci appropriate size for patient's age. The gray-white matter discrimination is preserved. There is  no acute intracranial hemorrhage. No mass effect or midline shift. No extra-axial fluid collection. Vascular: No hyperdense vessel or unexpected calcification. Skull: Normal. Negative for fracture or focal lesion. Sinuses/Orbits: No acute finding. Other: None CT CERVICAL SPINE FINDINGS Alignment: No acute subluxation. Skull base and vertebrae: No acute fracture. Scattered sclerotic lesions involving C1, C3, and C7 and T2 consistent with metastatic disease and in keeping with known prostate cancer. Soft tissues and spinal canal: No prevertebral fluid or swelling. No visible canal hematoma. Disc levels: Mild degenerative changes and spurring. No acute findings. Upper chest: The visualized lung apices are clear. Partially visualized right IJ central venous line. Probable tiny left apical pneumothorax measuring approximately 4 mm in thickness. Other: Bilateral carotid bulb calcified plaque. IMPRESSION: 1. No acute intracranial pathology. 2. No acute/traumatic cervical spine pathology. 3. Osseous metastatic disease. 4. Probable tiny left apical pneumothorax. These results were called by telephone at the time of interpretation on 10/29/2020 at 3:31 pm to provider [facility] , who verbally acknowledged these results. Electronically Signed   By: Anner Crete M.D.   On: 10/29/2020 15:40   CT Chest Wo Contrast  Result Date: 10/29/2020 CLINICAL DATA:  Left shoulder and back pain after a fall. Prostate cancer pre EXAM: CT CHEST WITHOUT CONTRAST TECHNIQUE: Multidetector CT imaging of the chest was performed following the standard protocol without IV contrast. COMPARISON:  Chest radiograph of earlier today.  Chest CT of 110 22. FINDINGS: Cardiovascular: Aortic atherosclerosis. Normal heart size with minimal pericardial fluid, likely physiologic. Multivessel coronary artery atherosclerosis. Right Port-A-Cath with tip at mid SVC. No evidence of aortic injury or mediastinal hematoma. Mediastinum/Nodes: No mediastinal or  definite hilar adenopathy, given limitations of unenhanced CT. Lungs/Pleura: Small left pleural effusion is similar. Development of a small left-sided pneumothorax, primarily anteriorly. Visceral pleural line up to 1.5 cm from chest wall on 117/3. Scattered areas of scarring bilaterally. The residual left lower lobe nodular density on 07/16/2020 has nearly completely resolved, with only peribronchovascular interstitial thickening remaining on 81/3. Upper Abdomen: Advanced cirrhosis. Dependent gallstone. Normal imaged portions of the spleen, adrenal glands, left kidney. Pancreatic atrophy. Status post gastric bypass. Musculoskeletal: Moderate bilateral gynecomastia. Widespread sclerotic osseous metastasis. Anterolateral left fourth rib fracture is minimally displaced on 67/2. Nondisplaced fifth and sixth left rib fractures. Minimally displaced lateral left  seventh rib fracture. Ninth posterior left rib nondisplaced fracture including on 92/2. IMPRESSION: 1. Left-sided rib fractures and small anterior left pneumothorax. 2. Chronic small volume left-sided pleural fluid. 3. Extensive osseous metastasis, as before. 4. Cirrhosis. 5. Cholelithiasis. Electronically Signed   By: Abigail Miyamoto M.D.   On: 10/29/2020 16:42   CT Cervical Spine Wo Contrast  Result Date: 10/29/2020 CLINICAL DATA:  70 year old male with neck trauma. EXAM: CT HEAD WITHOUT CONTRAST CT CERVICAL SPINE WITHOUT CONTRAST TECHNIQUE: Multidetector CT imaging of the head and cervical spine was performed following the standard protocol without intravenous contrast. Multiplanar CT image reconstructions of the cervical spine were also generated. COMPARISON:  Head CT dated 08/23/2019. FINDINGS: CT HEAD FINDINGS Brain: The ventricles and sulci appropriate size for patient's age. The gray-white matter discrimination is preserved. There is no acute intracranial hemorrhage. No mass effect or midline shift. No extra-axial fluid collection. Vascular: No hyperdense  vessel or unexpected calcification. Skull: Normal. Negative for fracture or focal lesion. Sinuses/Orbits: No acute finding. Other: None CT CERVICAL SPINE FINDINGS Alignment: No acute subluxation. Skull base and vertebrae: No acute fracture. Scattered sclerotic lesions involving C1, C3, and C7 and T2 consistent with metastatic disease and in keeping with known prostate cancer. Soft tissues and spinal canal: No prevertebral fluid or swelling. No visible canal hematoma. Disc levels: Mild degenerative changes and spurring. No acute findings. Upper chest: The visualized lung apices are clear. Partially visualized right IJ central venous line. Probable tiny left apical pneumothorax measuring approximately 4 mm in thickness. Other: Bilateral carotid bulb calcified plaque. IMPRESSION: 1. No acute intracranial pathology. 2. No acute/traumatic cervical spine pathology. 3. Osseous metastatic disease. 4. Probable tiny left apical pneumothorax. These results were called by telephone at the time of interpretation on 10/29/2020 at 3:31 pm to provider [facility] , who verbally acknowledged these results. Electronically Signed   By: Anner Crete M.D.   On: 10/29/2020 15:40   DG Shoulder Left  Result Date: 10/29/2020 CLINICAL DATA:  Post fall. EXAM: LEFT SHOULDER - 2+ VIEW COMPARISON:  None. FINDINGS: There is no evidence of fracture or dislocation. Mild acromioclavicular degenerative change. Small subacromial spur. Soft tissues are unremarkable. Mildly displaced fracture of left lateral fourth rib and nondisplaced fracture of left lateral fifth rib, only partially included in the field of view. IMPRESSION: 1. No fracture or dislocation of the left shoulder. 2. Left lateral fourth and fifth rib fractures, only partially included in the field of view. Electronically Signed   By: Keith Rake M.D.   On: 10/29/2020 15:15       I have independently reviewed the above radiology studies  and reviewed the findings with  the patient.   Recent Lab Findings: Lab Results  Component Value Date   WBC 2.8 (L) 10/15/2020   HGB 13.7 10/15/2020   HCT 40.2 10/15/2020   PLT 106 (L) 10/15/2020   GLUCOSE 93 10/15/2020   CHOL 112 12/12/2016   TRIG 97 12/12/2016   HDL 46 12/12/2016   LDLCALC 47 12/12/2016   ALT 11 10/15/2020   AST 22 10/15/2020   NA 139 10/15/2020   K 4.1 10/15/2020   CL 103 10/15/2020   CREATININE 1.21 10/15/2020   BUN 14 10/15/2020   CO2 28 10/15/2020   TSH 25.816 (H) 04/16/2020   INR 1.1 04/19/2019   HGBA1C 4.7 (L) 12/12/2016        Assessment / Plan:   70 yo male s/p GLF with multiple right sided rib fractures and a  small pneumothorax.  On review of the CT chest, most of the rib fractures are non to minimally displaced.  His pain is under good control with oral meds.  There is no need for any surgical fixation at this point.  His pneumothorax is resolved on today's CXR.  He does have a small fluid collection.  He will return in 1 month with a repeat CXR.      Lajuana Matte 10/31/2020 4:26 PM

## 2020-11-20 ENCOUNTER — Other Ambulatory Visit: Payer: Self-pay | Admitting: *Deleted

## 2020-11-20 DIAGNOSIS — J9311 Primary spontaneous pneumothorax: Secondary | ICD-10-CM

## 2020-11-20 NOTE — Progress Notes (Unsigned)
2

## 2020-11-22 ENCOUNTER — Ambulatory Visit
Admission: RE | Admit: 2020-11-22 | Discharge: 2020-11-22 | Disposition: A | Discharge status: Home / Self Care | Payer: Medicare HMO | Source: Ambulatory Visit | Attending: Thoracic Surgery (Cardiothoracic Vascular Surgery) | Admitting: Thoracic Surgery (Cardiothoracic Vascular Surgery)

## 2020-11-22 DIAGNOSIS — J9311 Primary spontaneous pneumothorax: Secondary | ICD-10-CM

## 2020-11-23 ENCOUNTER — Other Ambulatory Visit: Payer: Self-pay

## 2020-11-23 ENCOUNTER — Telehealth (INDEPENDENT_AMBULATORY_CARE_PROVIDER_SITE_OTHER): Payer: Medicare HMO | Admitting: Thoracic Surgery (Cardiothoracic Vascular Surgery)

## 2020-11-23 ENCOUNTER — Encounter: Payer: Self-pay | Admitting: Thoracic Surgery (Cardiothoracic Vascular Surgery)

## 2020-11-23 DIAGNOSIS — S2239XA Fracture of one rib, unspecified side, initial encounter for closed fracture: Secondary | ICD-10-CM

## 2020-11-23 NOTE — Progress Notes (Signed)
     [address]       [place] [address]             [phone]       Patient: Home Provider: Office Consent for Telemedicine visit obtained.  Today's visit was completed via a real-time telehealth (see specific modality noted below). The patient/authorized person provided oral consent at the time of the visit to engage in a telemedicine encounter with the present provider at [facility]. The patient/authorized person was informed of the potential benefits, limitations, and risks of telemedicine. The patient/authorized person expressed understanding that the laws that protect confidentiality also apply to telemedicine. The patient/authorized person acknowledged understanding that telemedicine does not provide emergency services and that he or she would need to call 911 or proceed to the nearest hospital for help if such a need arose.  . Total time spent in the clinical discussion 10 minutes. . Telehealth Modality: Phone visit (audio only)  I had a telephone visit with Dylan Ho.  He states that he is feeling better day by day.  He denies any shortness of breath.  He continues to have some chest wall pain but this is treated with Tylenol.  He is optimistic that fixation required.  Give [place] a call if there is any concerns.

## 2021-01-14 ENCOUNTER — Ambulatory Visit: Payer: Medicare HMO | Admitting: Oncology

## 2021-01-14 ENCOUNTER — Ambulatory Visit: Payer: Medicare HMO

## 2021-01-14 ENCOUNTER — Other Ambulatory Visit: Payer: Medicare HMO

## 2021-01-15 ENCOUNTER — Inpatient Hospital Stay (HOSPITAL_BASED_OUTPATIENT_CLINIC_OR_DEPARTMENT_OTHER): Payer: Medicare HMO | Admitting: Oncology

## 2021-01-15 ENCOUNTER — Other Ambulatory Visit: Payer: Self-pay

## 2021-01-15 ENCOUNTER — Encounter: Payer: Self-pay | Admitting: Oncology

## 2021-01-15 ENCOUNTER — Inpatient Hospital Stay: Payer: Medicare HMO

## 2021-01-15 ENCOUNTER — Inpatient Hospital Stay: Payer: Medicare HMO | Attending: Oncology

## 2021-01-15 VITALS — BP 144/80 | HR 59 | Temp 97.8°F | Resp 16 | Ht 71.0 in | Wt 232.8 lb

## 2021-01-15 DIAGNOSIS — Z5111 Encounter for antineoplastic chemotherapy: Secondary | ICD-10-CM | POA: Insufficient documentation

## 2021-01-15 DIAGNOSIS — Z5181 Encounter for therapeutic drug level monitoring: Secondary | ICD-10-CM

## 2021-01-15 DIAGNOSIS — D500 Iron deficiency anemia secondary to blood loss (chronic): Secondary | ICD-10-CM

## 2021-01-15 DIAGNOSIS — C61 Malignant neoplasm of prostate: Secondary | ICD-10-CM

## 2021-01-15 DIAGNOSIS — Z79818 Long term (current) use of other agents affecting estrogen receptors and estrogen levels: Secondary | ICD-10-CM | POA: Diagnosis not present

## 2021-01-15 DIAGNOSIS — C7951 Secondary malignant neoplasm of bone: Secondary | ICD-10-CM

## 2021-01-15 DIAGNOSIS — Z95828 Presence of other vascular implants and grafts: Secondary | ICD-10-CM

## 2021-01-15 LAB — COMPREHENSIVE METABOLIC PANEL
ALT: 10 U/L (ref 0–44)
AST: 18 U/L (ref 15–41)
Albumin: 3.7 g/dL (ref 3.5–5.0)
Alkaline Phosphatase: 74 U/L (ref 38–126)
Anion gap: 7 (ref 5–15)
BUN: 15 mg/dL (ref 8–23)
CO2: 27 mmol/L (ref 22–32)
Calcium: 8.5 mg/dL — ABNORMAL LOW (ref 8.9–10.3)
Chloride: 102 mmol/L (ref 98–111)
Creatinine, Ser: 1.15 mg/dL (ref 0.61–1.24)
GFR, Estimated: >60 mL/min (ref >60)
Glucose, Bld: 93 mg/dL (ref 70–99)
Potassium: 4 mmol/L (ref 3.5–5.1)
Sodium: 136 mmol/L (ref 135–145)
Total Bilirubin: 1 mg/dL (ref 0.3–1.2)
Total Protein: 6.3 g/dL — ABNORMAL LOW (ref 6.5–8.1)

## 2021-01-15 LAB — CBC WITH DIFFERENTIAL/PLATELET
Abs Immature Granulocytes: 0.01 10*3/uL (ref 0.00–0.07)
Basophils Absolute: 0 10*3/uL (ref 0.0–0.1)
Basophils Relative: 1 %
Eosinophils Absolute: 0.1 10*3/uL (ref 0.0–0.5)
Eosinophils Relative: 2 %
HCT: 37.3 % — ABNORMAL LOW (ref 39.0–52.0)
Hemoglobin: 12.9 g/dL — ABNORMAL LOW (ref 13.0–17.0)
Immature Granulocytes: 0 %
Lymphocytes Relative: 25 %
Lymphs Abs: 0.7 10*3/uL (ref 0.7–4.0)
MCH: 32.5 pg (ref 26.0–34.0)
MCHC: 34.6 g/dL (ref 30.0–36.0)
MCV: 94 fL (ref 80.0–100.0)
Monocytes Absolute: 0.2 10*3/uL (ref 0.1–1.0)
Monocytes Relative: 6 %
Neutro Abs: 1.8 10*3/uL (ref 1.7–7.7)
Neutrophils Relative %: 66 %
Platelets: 105 10*3/uL — ABNORMAL LOW (ref 150–400)
RBC: 3.97 MIL/uL — ABNORMAL LOW (ref 4.22–5.81)
RDW: 12.4 % (ref 11.5–15.5)
WBC: 2.8 10*3/uL — ABNORMAL LOW (ref 4.0–10.5)
nRBC: 0 % (ref 0.0–0.2)

## 2021-01-15 LAB — PSA: Prostatic Specific Antigen: 0.29 ng/mL (ref 0.00–4.00)

## 2021-01-15 MED ORDER — SODIUM CHLORIDE 0.9% FLUSH
10.0000 mL | Freq: Once | INTRAVENOUS | Status: AC
Start: 2021-01-15 — End: 2021-01-15
  Administered 2021-01-15: 10 mL via INTRAVENOUS
  Filled 2021-01-15: qty 10

## 2021-01-15 MED ORDER — HEPARIN SOD (PORK) LOCK FLUSH 100 UNIT/ML IV SOLN
INTRAVENOUS | Status: AC
  Filled 2021-01-15: qty 5

## 2021-01-15 MED ORDER — LEUPROLIDE ACETATE (3 MONTH) 22.5 MG ~~LOC~~ KIT
22.5000 mg | PACK | Freq: Once | SUBCUTANEOUS | Status: AC
  Administered 2021-01-15: 22.5 mg via SUBCUTANEOUS
  Filled 2021-01-15: qty 22.5

## 2021-01-15 MED ORDER — HEPARIN SOD (PORK) LOCK FLUSH 100 UNIT/ML IV SOLN
500.0000 [IU] | Freq: Once | INTRAVENOUS | Status: AC
  Administered 2021-01-15: 500 [IU] via INTRAVENOUS
  Filled 2021-01-15: qty 5

## 2021-01-15 NOTE — Progress Notes (Signed)
Pt still having  problems with eyes, watery most of the time, itches,, cloudy and burn bilateral. Been ever seince he started chemo and not changed since he has been off chemo

## 2021-01-16 NOTE — Progress Notes (Signed)
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  He has not had any ascites or labs suggestive of chronic liver disease.  He does not drink alcohol anymore.  Patient was seen by scale for ongoing urinary symptoms of frequency especially at night.  This was followed by a CT renal stone study which showed cyst in the upper pole of the right kidney as well as left kidney.  S/p gastric bypass.  Pelvic node enlargement in the right pelvis 11 mm.  Bulky rounded lymph node along the external iliac chain 17 mm.  Prostate was enlarged measuring 4.3 cm.  Right obturator lymph node 1 cm.  Sclerosis of L3.  Left hemisacrum with sclerotic lesion.  Sclerosis of the superior pubic rami.  Large area of sclerosis affecting the right hemisacrum 4.7 x 2.4 cm.  Left ninth and 11th rib with sclerotic lesions compatible with bone metastases.  Right hip sclerosis.  PSA was elevated at 18.6.   Patient underwent 12 core prostate biopsy which showed adenocarcinoma Gleason score 9(4+5) grade group 5.    Patient is currently on Lupron and finished 6 cycles of docetaxel chemotherapy In September 2021.  Interval history- Patient continues to report problems with his  vision despite getting new corrective glasses. Frequent tearing of his eyes. Energy levals have remained stable. Denies any new pain  ECOG PS- 1 Pain scale- 0   Review of systems- Review of Systems  Constitutional:  Positive for malaise/fatigue. Negative for chills, fever and weight loss.  HENT:  Negative for congestion, ear discharge and nosebleeds.   Eyes:  Negative for blurred vision.  Respiratory:  Negative for cough, hemoptysis, sputum production, shortness of breath and wheezing.   Cardiovascular:  Negative for chest pain, palpitations, orthopnea and claudication.  Gastrointestinal:  Negative for abdominal pain, blood in stool, constipation, diarrhea, heartburn, melena, nausea and vomiting.  Genitourinary:  Negative for dysuria, flank pain, frequency, hematuria and urgency.  Musculoskeletal:  Negative for back pain, joint pain and myalgias.  Skin:  Negative for rash.  Neurological:  Negative for dizziness, tingling, focal weakness, seizures, weakness and headaches.  Endo/Heme/Allergies:  Does not bruise/bleed easily.  Psychiatric/Behavioral:  Negative for depression and suicidal ideas. The patient does not have insomnia.      No Known Allergies   Past Medical History:  Diagnosis Date   Anemia    Arthritis    knees, shoulders   CAD (coronary artery disease)    No Stents Present- per patient   Cancer ([place])    Cirrhosis ([place])    mild   Colon polyps    Essential hypertension 08/24/2008   GERD (gastroesophageal reflux disease)    patient denies   History of alcohol abuse Last Drink- 2011   History of gout    Hypertension    no longer on meds   Hypothyroidism  Intestinal ulcer    Iron deficiency anemia due to chronic blood loss 02/27/2017   Other pancytopenia ([place]) 02/25/2017   Overactive bladder    Sleep apnea    improved since gastric bypass   Thyroid disease      Past Surgical History:  Procedure Laterality Date   BILATERAL TOTAL SHOULDER ARTHROPLASTY  1990's    CIRCUMCISION REVISION N/A 03/13/2017   Procedure: CIRCUMCISION REVISION;  Surgeon: Nickie Retort, MD;  Location: [facility];  Service: Urology;  Laterality: N/A;   COLONOSCOPY WITH PROPOFOL N/A 10/30/2016   Procedure: COLONOSCOPY WITH PROPOFOL;  Surgeon: Lucilla Lame, MD;  Location: [facility];  Service: Endoscopy;  Laterality: N/A;   ESOPHAGOGASTRODUODENOSCOPY (EGD) WITH PROPOFOL N/A 10/30/2016   Procedure: ESOPHAGOGASTRODUODENOSCOPY (EGD) WITH PROPOFOL;  Surgeon: Lucilla Lame, MD;  Location: [facility];  Service: Endoscopy;  Laterality: N/A;   ESOPHAGOGASTRODUODENOSCOPY (EGD) WITH PROPOFOL N/A 03/26/2017   Procedure: ESOPHAGOGASTRODUODENOSCOPY (EGD) WITH PROPOFOL;  Surgeon: Lucilla Lame, MD;  Location: [place];  Service: Gastroenterology;  Laterality: N/A;  requests early   GASTRIC BYPASS  2016   Dr. Darnell Level- [facility] [place]   KNEE ARTHROSCOPY  1990s   KNEE ARTHROSCOPY W/ MENISCAL REPAIR Bilateral    KNEE ARTHROSCOPY WITH MEDIAL MENISECTOMY Left 04/21/2019   Procedure: KNEE ARTHROSCOPY WITH MEDIAL MENISECTOMY;  Surgeon: Lovell Sheehan, MD;  Location: [place];  Service: Orthopedics;  Laterality: Left;   PORTA CATH INSERTION N/A 11/23/2019   Procedure: PORTA CATH INSERTION;  Surgeon: Algernon Huxley, MD;  Location: [place] [facility];  Service: Cardiovascular;  Laterality: N/A;   TONSILLECTOMY  2004    Social History   Socioeconomic History   Marital status: Married    Spouse name: Not on file   Number of children: Not on file   Years of education: Not on file   Highest education level: Not on file  Occupational History   Not on file  Tobacco Use   Smoking status: Never   Smokeless tobacco: Current    Types: Chew  Vaping Use   Vaping Use: Never used  Substance and Sexual Activity   Alcohol use: No    Comment: Heavy ETOH in past- Last Drink 2011 per patient   Drug use: No   Sexual activity: Not Currently  Other Topics Concern   Not on file  Social  History Narrative   Not on file   Social Determinants of Health   Financial Resource Strain: Not on file  Food Insecurity: Not on file  Transportation Needs: Not on file  Physical Activity: Not on file  Stress: Not on file  Social Connections: Not on file  Intimate Partner Violence: Not on file    Family History  Problem Relation Age of Onset   Stroke Mother    Breast cancer Mother    Heart disease Father    Stroke Father    AAA (abdominal aortic aneurysm) Father    Lung cancer Paternal Grandmother    Prostate cancer Brother    Bladder Cancer Neg Hx    Kidney cancer Neg Hx      Current Outpatient Medications:    levothyroxine (SYNTHROID, LEVOTHROID) 150 MCG tablet, Take 1 tablet (150 mcg total) by mouth daily before breakfast., Disp: 90 tablet, Rfl: 1   pantoprazole (PROTONIX) 40 MG tablet, Take 1 tablet (40 mg total) by mouth daily., Disp: 30 tablet, Rfl: 2   traZODone (DESYREL) 150 MG tablet, Take 4 tablets (600 mg total) by mouth at bedtime., Disp:  360 tablet, Rfl: 1 No current facility-administered medications for this visit.  Facility-Administered Medications Ordered in Other Visits:    sodium chloride flush (NS) 0.9 % injection 10 mL, 10 mL, Intravenous, PRN, Sindy Guadeloupe, MD, 10 mL at 09/11/20 1138  Physical exam:  Vitals:   01/15/21 1414  BP: (!) 144/80  Pulse: (!) 59  Resp: 16  Temp: 97.8 F (36.6 C)  TempSrc: Tympanic  Weight: 232 lb 12.8 oz (105.6 kg)  Height: 5\' 11"  (1.803 m)   Physical Exam   CMP Latest Ref Rng & Units 01/15/2021  Glucose 70 - 99 mg/dL 93  BUN 8 - 23 mg/dL 15  Creatinine 0.61 - 1.24 mg/dL 1.15  Sodium 135 - 145 mmol/L 136  Potassium 3.5 - 5.1 mmol/L 4.0  Chloride 98 - 111 mmol/L 102  CO2 22 - 32 mmol/L 27  Calcium 8.9 - 10.3 mg/dL 8.5(L)  Total Protein 6.5 - 8.1 g/dL 6.3(L)  Total Bilirubin 0.3 - 1.2 mg/dL 1.0  Alkaline Phos 38 - 126 U/L 74  AST 15 - 41 U/L 18  ALT 0 - 44 U/L 10   CBC Latest Ref Rng & Units 01/15/2021   WBC 4.0 - 10.5 K/uL 2.8(L)  Hemoglobin 13.0 - 17.0 g/dL 12.9(L)  Hematocrit 39.0 - 52.0 % 37.3(L)  Platelets 150 - 400 K/uL 105(L)       Assessment and plan- Patient is a 70 y.o. 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  She he completed docetaxel and September 2021 and tolerated it well.  PSA continues to trend down and isDown to 0.29 presently from a prior value of 0.393 months ago.  Will consider scans on a yearly basis or if PSA starts trending back up.  Vision issues: I have strongly recommended with patient should see an ophthalmologist to get it evaluated further.  It has been a while since he completed chemotherapy and therefore does not appear to be related to it.   Visit Diagnosis 1. Prostate cancer metastatic to bone ([place])   2. Encounter for monitoring Lupron therapy      Dr. Randa Evens, MD, MPH [facility] [phone] 01/16/2021 8:57 AM

## 2021-01-17 ENCOUNTER — Ambulatory Visit: Payer: Medicare HMO | Admitting: Oncology

## 2021-01-17 ENCOUNTER — Inpatient Hospital Stay: Payer: Medicare HMO | Admitting: Oncology

## 2021-01-17 ENCOUNTER — Other Ambulatory Visit: Payer: Medicare HMO

## 2021-01-17 ENCOUNTER — Inpatient Hospital Stay: Payer: Medicare HMO

## 2021-01-17 ENCOUNTER — Encounter: Payer: Medicare HMO | Admitting: Licensed Clinical Social Worker

## 2021-01-17 ENCOUNTER — Ambulatory Visit: Payer: Medicare HMO

## 2021-01-20 ENCOUNTER — Encounter: Payer: Self-pay | Admitting: Oncology

## 2021-02-04 IMAGING — NM NM BONE WHOLE BODY
1 series · 2 of 2 positions shown · non-contrast
Comparison: 11/16/2019

Correlation: CT chest abdomen pelvis 04/13/2020

CLINICAL DATA: Prostate cancer metastatic to bone

EXAM:
NUCLEAR MEDICINE WHOLE BODY BONE SCAN
TECHNIQUE: Whole body anterior and posterior images were obtained approximately
3 hours after intravenous injection of radiopharmaceutical.
RADIOPHARMACEUTICALS:  22.371 mCi Dechnetium-NNm MDP IV

[Series 1000: 3 hr wholebody · 2.40mm/px · 2 of 2 frames shown]
[frame 1/2]
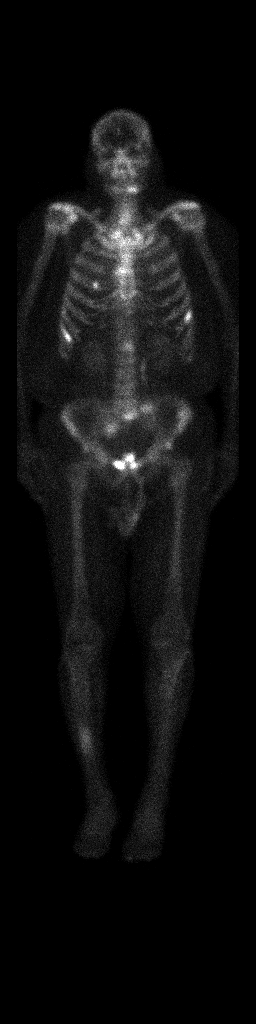
[frame 2/2]
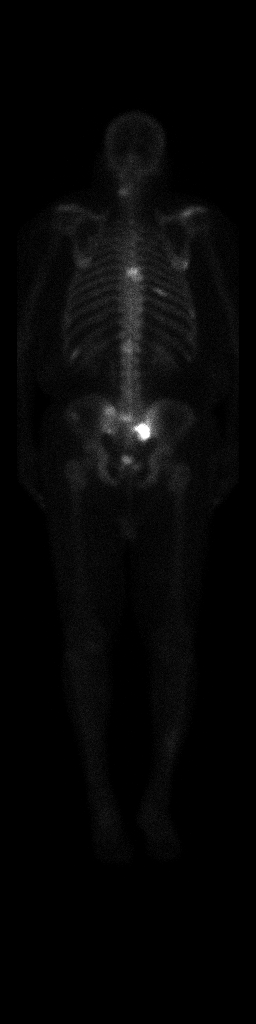

[2 of 2 positions shown; findings below may reference images not displayed]

FINDINGS: Multiple sites of abnormal tracer uptake are seen consistent with
osseous metastatic disease.

These include adjacent levels in the midthoracic spine, LEFT
cervical spine, BILATERAL ribs, sternum, and pelvis.

All of the observed sites demonstrate a lesser degree of tracer
uptake than on prior exam.

Uptake LEFT mandible, could be metastatic or related to dental
disease.

Uptake mid RIGHT tibia unchanged, question from remote fracture
versus metastasis calculi and correlation with patient history.

No new sites of abnormal tracer accumulation are seen.

Expected urinary tract and soft tissue distribution of tracer.
IMPRESSION: Decreased degree of tracer uptake at multiple osseous metastases
when compared to the previous exam.

No new scintigraphic abnormalities.

Uptake in the mid RIGHT tibia, question sequela of remote trauma
though metastatic lesion is not excluded, recommend correlation with
patient history; in the absence of a history of fracture, recommend
radiographic correlation (if not previously performed).

## 2021-03-21 ENCOUNTER — Telehealth: Payer: Self-pay | Admitting: *Deleted

## 2021-03-21 NOTE — Telephone Encounter (Signed)
Pt having lost of pain in right hip and right ribs. He feels urgency to go to bathroom and then it dribbles a few drops sometimes a Uy stream and it burns at the end. He has no pain med. And he does not see blood in urine. Called the patient back and let him know that he can come in tomorrow for smc. The patient says that he can't come tomorrow. He would like [facility] appt on Monday. I asked if he could come and get ua and cult. But he said he does not get off til 4 pm and will not be able to get there to give specimen because he is in [place] and not make it in time. I told him that I will get him an appt for Monday and call him with it. He is agreeable to this

## 2021-03-22 ENCOUNTER — Other Ambulatory Visit: Payer: Self-pay | Admitting: *Deleted

## 2021-03-22 NOTE — Progress Notes (Signed)
I put in lab orders for the pt. Anderson Malta called pt and let him know about the date and time for the labs and [facility] visit. Pt agreeable to the appt

## 2021-03-25 ENCOUNTER — Inpatient Hospital Stay (HOSPITAL_BASED_OUTPATIENT_CLINIC_OR_DEPARTMENT_OTHER): Payer: Medicare HMO | Admitting: Oncology

## 2021-03-25 ENCOUNTER — Inpatient Hospital Stay: Payer: Medicare HMO

## 2021-03-25 ENCOUNTER — Inpatient Hospital Stay: Payer: Medicare HMO | Attending: Oncology

## 2021-03-25 ENCOUNTER — Other Ambulatory Visit: Payer: Self-pay

## 2021-03-25 VITALS — BP 150/80 | HR 59 | Temp 98.6°F | Resp 18

## 2021-03-25 DIAGNOSIS — R109 Unspecified abdominal pain: Secondary | ICD-10-CM | POA: Diagnosis not present

## 2021-03-25 DIAGNOSIS — M545 Low back pain, unspecified: Secondary | ICD-10-CM | POA: Diagnosis not present

## 2021-03-25 DIAGNOSIS — C7951 Secondary malignant neoplasm of bone: Secondary | ICD-10-CM | POA: Diagnosis not present

## 2021-03-25 DIAGNOSIS — C61 Malignant neoplasm of prostate: Secondary | ICD-10-CM

## 2021-03-25 DIAGNOSIS — R0781 Pleurodynia: Secondary | ICD-10-CM

## 2021-03-25 DIAGNOSIS — M533 Sacrococcygeal disorders, not elsewhere classified: Secondary | ICD-10-CM

## 2021-03-25 DIAGNOSIS — M79652 Pain in left thigh: Secondary | ICD-10-CM

## 2021-03-25 DIAGNOSIS — I10 Essential (primary) hypertension: Secondary | ICD-10-CM | POA: Diagnosis not present

## 2021-03-25 DIAGNOSIS — Z803 Family history of malignant neoplasm of breast: Secondary | ICD-10-CM | POA: Diagnosis not present

## 2021-03-25 DIAGNOSIS — Z801 Family history of malignant neoplasm of trachea, bronchus and lung: Secondary | ICD-10-CM | POA: Diagnosis not present

## 2021-03-25 DIAGNOSIS — Z95828 Presence of other vascular implants and grafts: Secondary | ICD-10-CM

## 2021-03-25 DIAGNOSIS — R300 Dysuria: Secondary | ICD-10-CM

## 2021-03-25 DIAGNOSIS — Z8042 Family history of malignant neoplasm of prostate: Secondary | ICD-10-CM | POA: Insufficient documentation

## 2021-03-25 LAB — CBC WITH DIFFERENTIAL/PLATELET
Abs Immature Granulocytes: 0.01 10*3/uL (ref 0.00–0.07)
Basophils Absolute: 0 10*3/uL (ref 0.0–0.1)
Basophils Relative: 1 %
Eosinophils Absolute: 0.1 10*3/uL (ref 0.0–0.5)
Eosinophils Relative: 2 %
HCT: 36.5 % — ABNORMAL LOW (ref 39.0–52.0)
Hemoglobin: 12.5 g/dL — ABNORMAL LOW (ref 13.0–17.0)
Immature Granulocytes: 0 %
Lymphocytes Relative: 28 %
Lymphs Abs: 0.7 10*3/uL (ref 0.7–4.0)
MCH: 32.2 pg (ref 26.0–34.0)
MCHC: 34.2 g/dL (ref 30.0–36.0)
MCV: 94.1 fL (ref 80.0–100.0)
Monocytes Absolute: 0.2 10*3/uL (ref 0.1–1.0)
Monocytes Relative: 7 %
Neutro Abs: 1.6 10*3/uL — ABNORMAL LOW (ref 1.7–7.7)
Neutrophils Relative %: 62 %
Platelets: 91 10*3/uL — ABNORMAL LOW (ref 150–400)
RBC: 3.88 MIL/uL — ABNORMAL LOW (ref 4.22–5.81)
RDW: 12.9 % (ref 11.5–15.5)
WBC: 2.6 10*3/uL — ABNORMAL LOW (ref 4.0–10.5)
nRBC: 0 % (ref 0.0–0.2)

## 2021-03-25 LAB — COMPREHENSIVE METABOLIC PANEL
ALT: 10 U/L (ref 0–44)
AST: 17 U/L (ref 15–41)
Albumin: 3.6 g/dL (ref 3.5–5.0)
Alkaline Phosphatase: 53 U/L (ref 38–126)
Anion gap: 8 (ref 5–15)
BUN: 16 mg/dL (ref 8–23)
CO2: 26 mmol/L (ref 22–32)
Calcium: 8.5 mg/dL — ABNORMAL LOW (ref 8.9–10.3)
Chloride: 103 mmol/L (ref 98–111)
Creatinine, Ser: 1.09 mg/dL (ref 0.61–1.24)
GFR, Estimated: >60 mL/min (ref >60)
Glucose, Bld: 92 mg/dL (ref 70–99)
Potassium: 3.8 mmol/L (ref 3.5–5.1)
Sodium: 137 mmol/L (ref 135–145)
Total Bilirubin: 0.9 mg/dL (ref 0.3–1.2)
Total Protein: 6 g/dL — ABNORMAL LOW (ref 6.5–8.1)

## 2021-03-25 LAB — URINALYSIS, COMPLETE (UACMP) WITH MICROSCOPIC
Bacteria, UA: NONE SEEN
Bilirubin Urine: NEGATIVE
Glucose, UA: NEGATIVE mg/dL
Hgb urine dipstick: NEGATIVE
Ketones, ur: NEGATIVE mg/dL
Leukocytes,Ua: NEGATIVE
Nitrite: NEGATIVE
Protein, ur: NEGATIVE mg/dL
Specific Gravity, Urine: 1.025 (ref 1.005–1.030)
pH: 6 (ref 5.0–8.0)

## 2021-03-25 LAB — PSA: Prostatic Specific Antigen: 0.28 ng/mL (ref 0.00–4.00)

## 2021-03-25 MED ORDER — DEXAMETHASONE SODIUM PHOSPHATE 10 MG/ML IJ SOLN
10.0000 mg | Freq: Once | INTRAMUSCULAR | Status: AC
  Administered 2021-03-25: 10 mg via INTRAVENOUS
  Filled 2021-03-25: qty 1

## 2021-03-25 MED ORDER — HEPARIN SOD (PORK) LOCK FLUSH 100 UNIT/ML IV SOLN
500.0000 [IU] | Freq: Once | INTRAVENOUS | Status: AC
  Administered 2021-03-25: 500 [IU] via INTRAVENOUS
  Filled 2021-03-25: qty 5

## 2021-03-25 MED ORDER — SODIUM CHLORIDE 0.9% FLUSH
10.0000 mL | Freq: Once | INTRAVENOUS | Status: AC
  Administered 2021-03-25: 10 mL via INTRAVENOUS
  Filled 2021-03-25: qty 10

## 2021-03-25 MED ORDER — SODIUM CHLORIDE 0.9 % IV SOLN: 10.0000 mg | Freq: Once | INTRAVENOUS | Status: DC

## 2021-03-25 NOTE — Progress Notes (Signed)
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  He has not had any ascites or labs suggestive of chronic liver disease.  He does not drink alcohol anymore.  Patient was seen by scale for ongoing urinary symptoms of frequency especially at night.  This was followed by a CT renal stone study which showed cyst in the upper pole of the right kidney as well as left kidney.  S/p gastric bypass.  Pelvic node enlargement in the right pelvis 11 mm.  Bulky rounded lymph node along the external iliac chain 17 mm.  Prostate was enlarged measuring 4.3 cm.  Right obturator lymph node 1 cm.  Sclerosis of L3.  Left hemisacrum with sclerotic lesion.  Sclerosis of the superior pubic rami.  Large area of sclerosis affecting the right hemisacrum 4.7 x 2.4 cm.  Left ninth and 11th rib with sclerotic lesions compatible with bone metastases.  Right hip sclerosis.  PSA was elevated at 18.6.   Patient underwent 12 core prostate biopsy which showed adenocarcinoma Gleason score 9(4+5) grade group 5.    Patient is currently on Lupron and finished 6 cycles of docetaxel chemotherapy In September 2021.  Interval history-patient reports to clinic today for acute onset  back, hip and rib pain and several urinary concerns.  Reports symptoms started approximately 1 month ago and have worsened.  Describes pain in his lumbar spine, fourth and fifth rib on left side and left hip/femur bone pain.  Reports trouble with ambulation secondary to pain.  He does not like to take medication so he has not tried anything.  Reports dysuria, dribbling and feeling like he is not completely emptying his bladder.  He was previously followed by urology but has not seen them in quite some time.  He has occasional lower abdominal pain as well.  Denies any fevers or chills.  Denies hematuria.  Denies any changes in his appetite.  ECOG PS- 1 Pain scale- 0   Review of systems- Review of Systems  Constitutional:  Positive for malaise/fatigue.  Genitourinary:  Positive for dysuria, flank pain, frequency and urgency.  Musculoskeletal:  Positive for back pain, joint pain and myalgias.     No Known Allergies   Past Medical History:  Diagnosis Date   Anemia    Arthritis    knees, shoulders   CAD (coronary artery disease)    No Stents Present- per patient   Cancer ([place])    Cirrhosis ([place])    mild   Colon polyps    Essential hypertension 08/24/2008   GERD (gastroesophageal reflux disease)    patient denies   History of alcohol abuse Last Drink- 2011   History of gout    Hypertension    no longer on meds   Hypothyroidism    Intestinal ulcer    Iron deficiency anemia due to chronic blood  loss 02/27/2017   Other pancytopenia ([place]) 02/25/2017   Overactive bladder    Sleep apnea    improved since gastric bypass   Thyroid disease      Past Surgical History:  Procedure Laterality Date   BILATERAL TOTAL SHOULDER ARTHROPLASTY  1990's   CIRCUMCISION REVISION N/A 03/13/2017   Procedure: CIRCUMCISION REVISION;  Surgeon: Nickie Retort, MD;  Location: [facility];  Service: Urology;  Laterality: N/A;   COLONOSCOPY WITH PROPOFOL N/A 10/30/2016   Procedure: COLONOSCOPY WITH PROPOFOL;   Surgeon: Lucilla Lame, MD;  Location: [facility];  Service: Endoscopy;  Laterality: N/A;   ESOPHAGOGASTRODUODENOSCOPY (EGD) WITH PROPOFOL N/A 10/30/2016   Procedure: ESOPHAGOGASTRODUODENOSCOPY (EGD) WITH PROPOFOL;  Surgeon: Lucilla Lame, MD;  Location: [facility];  Service: Endoscopy;  Laterality: N/A;   ESOPHAGOGASTRODUODENOSCOPY (EGD) WITH PROPOFOL N/A 03/26/2017   Procedure: ESOPHAGOGASTRODUODENOSCOPY (EGD) WITH PROPOFOL;  Surgeon: Lucilla Lame, MD;  Location: [place];  Service: Gastroenterology;  Laterality: N/A;  requests early   GASTRIC BYPASS  2016   Dr. Darnell Level- [facility] [place]   KNEE ARTHROSCOPY  1990s   KNEE ARTHROSCOPY W/ MENISCAL REPAIR Bilateral    KNEE ARTHROSCOPY WITH MEDIAL MENISECTOMY Left 04/21/2019   Procedure: KNEE ARTHROSCOPY WITH MEDIAL MENISECTOMY;  Surgeon: Lovell Sheehan, MD;  Location: [place];  Service: Orthopedics;  Laterality: Left;   PORTA CATH INSERTION N/A 11/23/2019   Procedure: PORTA CATH INSERTION;  Surgeon: Algernon Huxley, MD;  Location: [place] [facility];  Service: Cardiovascular;  Laterality: N/A;   TONSILLECTOMY  2004    Social History   Socioeconomic History   Marital status: Married    Spouse name: Not on file   Number of children: Not on file   Years of education: Not on file   Highest education level: Not on file  Occupational History   Not on file  Tobacco Use   Smoking status: Never   Smokeless tobacco: Current    Types: Chew  Vaping Use   Vaping Use: Never used  Substance and Sexual Activity   Alcohol use: No    Comment: Heavy ETOH in past- Last Drink 2011 per patient   Drug use: No   Sexual activity: Not Currently  Other Topics Concern   Not on file  Social History Narrative   Not on file   Social Determinants of Health   Financial Resource Strain: Not on file  Food Insecurity: Not on file  Transportation Needs: Not on file  Physical Activity: Not on file  Stress: Not on file  Social Connections: Not  on file  Intimate Partner Violence: Not on file    Family History  Problem Relation Age of Onset   Stroke Mother    Breast cancer Mother    Heart disease Father    Stroke Father    AAA (abdominal aortic aneurysm) Father    Lung cancer Paternal Grandmother    Prostate cancer Brother    Bladder Cancer Neg Hx    Kidney cancer Neg Hx      Current Outpatient Medications:    levothyroxine (SYNTHROID, LEVOTHROID) 150 MCG tablet, Take 1 tablet (150 mcg total) by mouth daily before breakfast., Disp: 90 tablet, Rfl: 1   pantoprazole (PROTONIX) 40 MG tablet, Take 1 tablet (40 mg total) by mouth daily., Disp: 30 tablet, Rfl: 2   traZODone (DESYREL) 150 MG tablet, Take 4 tablets (600 mg total) by mouth at bedtime., Disp: 360 tablet, Rfl: 1 No current facility-administered medications for this visit.  Facility-Administered Medications Ordered in Other Visits:    sodium chloride flush (NS) 0.9 % injection 10 mL, 10 mL, Intravenous, PRN, Sindy Guadeloupe, MD, 10 mL at 09/11/20 1138  Physical exam:  Vitals:   03/25/21 1353  BP: (!) 150/80  Pulse: (!) 59  Resp: 18  Temp: 98.6 F (37 C)  TempSrc: Tympanic  SpO2: 98%   Physical Exam Constitutional:      Appearance: Normal appearance. He is obese.  HENT:     Head: Normocephalic and atraumatic.  Eyes:     Pupils: Pupils are equal, round, and reactive to light.  Cardiovascular:     Rate and Rhythm: Normal rate and regular rhythm.     Heart sounds: Normal heart sounds. No murmur heard. Pulmonary:     Effort: Pulmonary effort is normal.     Breath sounds: Normal breath sounds. No wheezing.  Abdominal:     General: Bowel sounds are normal. There is no distension.     Palpations: Abdomen is soft.     Tenderness: There is abdominal tenderness in the suprapubic area.  Musculoskeletal:        General: Normal range of motion.       Arms:     Cervical back: Normal range of motion.     Left upper leg: Bony tenderness present.        Legs:  Skin:    General: Skin is warm and dry.     Findings: No rash.  Neurological:     Mental Status: He is alert and oriented to person, place, and time.  Psychiatric:        Judgment: Judgment normal.     CMP Latest Ref Rng & Units 03/25/2021  Glucose 70 - 99 mg/dL 92  BUN 8 - 23 mg/dL 16  Creatinine 0.61 - 1.24 mg/dL 1.09  Sodium 135 - 145 mmol/L 137  Potassium 3.5 - 5.1 mmol/L 3.8  Chloride 98 - 111 mmol/L 103  CO2 22 - 32 mmol/L 26  Calcium 8.9 - 10.3 mg/dL 8.5(L)  Total Protein 6.5 - 8.1 g/dL 6.0(L)  Total Bilirubin 0.3 - 1.2 mg/dL 0.9  Alkaline Phos 38 - 126 U/L 53  AST 15 - 41 U/L 17  ALT 0 - 44 U/L 10   CBC Latest Ref Rng & Units 03/25/2021  WBC 4.0 - 10.5 K/uL 2.6(L)  Hemoglobin 13.0 - 17.0 g/dL 12.5(L)  Hematocrit 39.0 - 52.0 % 36.5(L)  Platelets 150 - 400 K/uL 91(L)    Assessment and plan- Patient is a 70 y.o. 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  Castrate sensitive metastatic prostate cancer with bone metastasis- Completed docetaxel in September 2021 and tolerated this well.  He is currently on Lupron and plan is to switch him to 80-month dosing next month.  PSA is presently normal (0.28).  He last had imaging back in January 2022 which showed irregular airspace opacities throughout the lungs that looked improved and multiple osseous metastasis in right tibial diaphysis, bilateral ribs, sternum, right scapula, pelvis and spine T6, T7 and L1.  He is scheduled next month for follow-up with Dr. Janese Banks.  New onset bone pain- Spoke with Dr. Janese Banks who recommends repeat imaging including both whole-body bone scan and CT chest abdomen and pelvis given previous imaging and bone mets. Orders placed we will get this scheduled in the next 1 to 2 weeks. Patient agreeable.   Dysuria/flank pain- Unclear etiology- Urinary retention/BPH, kidney  stones??? UA/UC is negative/no growth. Kidney function normal. CT scan  as above. Refer back to urology.   Disposition- Referral back to urology.  CT CAP scan and bone scan in the next 1-2 weeks.  RTC as scheduled to see Dr. Janese Banks next month for next lupron.   I spent 35 minutes dedicated to the care of this patient (face-to-face and non-face-to-face) on the date of the encounter to include what is described in the assessment and plan.  Visit Diagnosis 1. Prostate cancer metastatic to bone ([facility])   2. Rib pain on left side   3. Acute pain of left thigh   4. Sacral back pain   5. Dysuria    Faythe Casa, NP 03/27/2021 12:13 PM

## 2021-03-25 NOTE — Progress Notes (Signed)
Pr reports urgency, frequency, and pain with urination. States that he has pain to his left flank and back. Denies seeing any blood.

## 2021-03-27 ENCOUNTER — Telehealth: Payer: Self-pay | Admitting: Oncology

## 2021-03-27 ENCOUNTER — Encounter: Payer: Self-pay | Admitting: Oncology

## 2021-03-27 LAB — URINE CULTURE: Culture: NO GROWTH

## 2021-03-27 NOTE — Telephone Encounter (Signed)
Left VM with patient to make him aware of Bone scan and CT scan scheduled. Provided day/time and instructions and emphasized that he must pick up his oral prep at least a day before his scan.

## 2021-03-31 ENCOUNTER — Encounter: Payer: Self-pay | Admitting: Oncology

## 2021-04-03 ENCOUNTER — Encounter: Payer: Self-pay | Admitting: Urology

## 2021-04-03 ENCOUNTER — Ambulatory Visit: Payer: Medicare HMO | Admitting: Urology

## 2021-04-03 ENCOUNTER — Other Ambulatory Visit: Payer: Self-pay

## 2021-04-03 VITALS — BP 124/82 | HR 77 | Ht 71.0 in | Wt 232.0 lb

## 2021-04-03 DIAGNOSIS — R399 Unspecified symptoms and signs involving the genitourinary system: Secondary | ICD-10-CM

## 2021-04-03 DIAGNOSIS — R300 Dysuria: Secondary | ICD-10-CM | POA: Diagnosis not present

## 2021-04-03 DIAGNOSIS — C61 Malignant neoplasm of prostate: Secondary | ICD-10-CM | POA: Diagnosis not present

## 2021-04-03 DIAGNOSIS — R3129 Other microscopic hematuria: Secondary | ICD-10-CM

## 2021-04-03 LAB — BLADDER SCAN AMB NON-IMAGING: Scan Result: 0

## 2021-04-03 MED ORDER — GEMTESA 75 MG PO TABS: ORAL_TABLET | ORAL | 0 refills | Status: DC

## 2021-04-03 NOTE — Progress Notes (Signed)
04/03/2021 3:17 PM   Dylan Ho 1951-06-13 [id number]  Referring provider: Jacquelin Hawking, NP [place],  [place] [address]  Chief Complaint  Patient presents with   Dysuria    HPI: 70 y.o. male seen for evaluation of lower urinary tract symptoms.  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)    Cirrhosis ([place])    mild   Colon polyps    Essential hypertension 08/24/2008   GERD (gastroesophageal reflux disease)    patient denies   History of alcohol abuse Last Drink- 2011   History of gout    Hypertension    no longer on meds   Hypothyroidism    Intestinal ulcer    Iron deficiency anemia due to chronic blood loss 02/27/2017   Other pancytopenia ([place]) 02/25/2017   Overactive bladder    Sleep apnea    improved since gastric bypass   Thyroid disease     Surgical History: Past Surgical History:  Procedure Laterality Date   BILATERAL TOTAL SHOULDER ARTHROPLASTY  1990's   CIRCUMCISION REVISION N/A 03/13/2017   Procedure: CIRCUMCISION REVISION;  Surgeon: Nickie Retort, MD;  Location: [facility];  Service: Urology;  Laterality: N/A;   COLONOSCOPY WITH  PROPOFOL N/A 10/30/2016   Procedure: COLONOSCOPY WITH PROPOFOL;  Surgeon: Lucilla Lame, MD;  Location: [facility];  Service: Endoscopy;  Laterality: N/A;   ESOPHAGOGASTRODUODENOSCOPY (EGD) WITH PROPOFOL N/A 10/30/2016   Procedure: ESOPHAGOGASTRODUODENOSCOPY (EGD) WITH PROPOFOL;  Surgeon: Lucilla Lame, MD;  Location: [facility];  Service: Endoscopy;  Laterality: N/A;   ESOPHAGOGASTRODUODENOSCOPY (EGD) WITH PROPOFOL N/A 03/26/2017   Procedure: ESOPHAGOGASTRODUODENOSCOPY (EGD) WITH PROPOFOL;  Surgeon: Lucilla Lame, MD;  Location: [facility];  Service: Gastroenterology;  Laterality: N/A;  requests early   GASTRIC BYPASS  2016   Dr. Darnell Level- [facility] [place], [place]   KNEE ARTHROSCOPY  1990s   KNEE ARTHROSCOPY W/ MENISCAL REPAIR Bilateral    KNEE ARTHROSCOPY WITH MEDIAL MENISECTOMY Left 04/21/2019   Procedure: KNEE ARTHROSCOPY WITH MEDIAL MENISECTOMY;  Surgeon: Lovell Sheehan, MD;  Location: [facility];  Service: Orthopedics;  Laterality: Left;   PORTA CATH INSERTION N/A 11/23/2019   Procedure: PORTA CATH INSERTION;  Surgeon: Algernon Huxley, MD;  Location: [facility];  Service: Cardiovascular;  Laterality: N/A;   TONSILLECTOMY  2004    Home Medications:  Allergies as of 04/03/2021   No Known Allergies      Medication List        Accurate as of April 03, 2021  3:17 PM. If you have any questions, ask your nurse or doctor.          levothyroxine 200 MCG tablet Commonly known as: SYNTHROID Take 200 mcg by mouth daily.  What changed: Another medication with the same name was removed. Continue taking this medication, and follow the directions you see here. Changed by: Abbie Sons, MD   nystatin cream Commonly known as: MYCOSTATIN Apply 1 application topically 2 (two) times daily.   pantoprazole 40 MG tablet Commonly known as: Protonix Take 1 tablet (40 mg total) by mouth daily.   traZODone 150 MG tablet Commonly known as: DESYREL Take 4 tablets (600 mg total) by  mouth at bedtime.        Allergies: No Known Allergies  Family History: Family History  Problem Relation Age of Onset   Stroke Mother    Breast cancer Mother    Heart disease Father    Stroke Father    AAA (abdominal aortic aneurysm) Father    Lung cancer Paternal Grandmother    Prostate cancer Brother    Bladder Cancer Neg Hx    Kidney cancer Neg Hx     Social History:  reports that he has never smoked. His smokeless tobacco use includes chew. He reports that he does not drink alcohol and does not use drugs.   Physical Exam: BP 124/82   Pulse 77   Ht 5\' 11"  (1.803 m)   Wt 232 lb (105.2 kg)   BMI 32.36 kg/m   Constitutional:  Alert and oriented, No acute distress. HEENT: [place] AT, moist mucus membranes.  Trachea midline, no masses. Cardiovascular: No clubbing, cyanosis, or edema. Respiratory: Normal respiratory effort, no increased work of breathing. Neurologic: Grossly intact, no focal deficits, moving all 4 extremities. Psychiatric: Normal mood and affect.   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

## 2021-04-04 LAB — URINALYSIS, COMPLETE
Bilirubin, UA: NEGATIVE
Glucose, UA: NEGATIVE
Ketones, UA: NEGATIVE
Leukocytes,UA: NEGATIVE
Nitrite, UA: NEGATIVE
Protein,UA: NEGATIVE
Specific Gravity, UA: >1.03 — ABNORMAL HIGH (ref 1.005–1.030)
Urobilinogen, Ur: 1 mg/dL (ref 0.2–1.0)
pH, UA: 5.5 (ref 5.0–7.5)

## 2021-04-04 LAB — MICROSCOPIC EXAMINATION

## 2021-04-05 ENCOUNTER — Telehealth: Payer: Self-pay | Admitting: Family Medicine

## 2021-04-05 NOTE — Telephone Encounter (Signed)
Patient notified and voiced understanding. Appointment for Cysto made.

## 2021-04-05 NOTE — Telephone Encounter (Signed)
-----   Message from Abbie Sons, MD sent at 04/05/2021  7:50 AM EDT ----- Urinalysis did show microhematuria.  Recommend scheduling cystoscopy sometime after his CT scan that is scheduled 04/12/2021

## 2021-04-07 ENCOUNTER — Encounter: Payer: Self-pay | Admitting: Urology

## 2021-04-12 ENCOUNTER — Other Ambulatory Visit: Payer: Self-pay

## 2021-04-12 ENCOUNTER — Ambulatory Visit
Admission: RE | Admit: 2021-04-12 | Discharge: 2021-04-12 | Disposition: A | Discharge status: Home / Self Care | Payer: Medicare HMO | Source: Ambulatory Visit | Attending: Oncology | Admitting: Oncology

## 2021-04-12 ENCOUNTER — Encounter
Admission: RE | Admit: 2021-04-12 | Discharge: 2021-04-12 | Disposition: A | Discharge status: Home / Self Care | Payer: Medicare HMO | Source: Ambulatory Visit | Attending: Oncology | Admitting: Oncology

## 2021-04-12 DIAGNOSIS — C61 Malignant neoplasm of prostate: Secondary | ICD-10-CM | POA: Diagnosis present

## 2021-04-12 DIAGNOSIS — R0781 Pleurodynia: Secondary | ICD-10-CM | POA: Diagnosis present

## 2021-04-12 DIAGNOSIS — M79652 Pain in left thigh: Secondary | ICD-10-CM

## 2021-04-12 DIAGNOSIS — M533 Sacrococcygeal disorders, not elsewhere classified: Secondary | ICD-10-CM | POA: Diagnosis present

## 2021-04-12 DIAGNOSIS — C7951 Secondary malignant neoplasm of bone: Secondary | ICD-10-CM | POA: Insufficient documentation

## 2021-04-12 MED ORDER — IOHEXOL 350 MG/ML SOLN
75.0000 mL | Freq: Once | INTRAVENOUS | Status: AC | PRN
  Administered 2021-04-12: 75 mL via INTRAVENOUS

## 2021-04-12 MED ORDER — TECHNETIUM TC 99M MEDRONATE IV KIT
20.0000 | PACK | Freq: Once | INTRAVENOUS | Status: AC | PRN
  Administered 2021-04-12: 20.55 via INTRAVENOUS

## 2021-04-16 NOTE — Progress Notes (Signed)
Scan. I believe you see him next week.   Faythe Casa, NP 04/16/2021 8:50 AM

## 2021-04-18 ENCOUNTER — Other Ambulatory Visit: Payer: Self-pay

## 2021-04-18 ENCOUNTER — Encounter: Payer: Self-pay | Admitting: Urology

## 2021-04-18 ENCOUNTER — Ambulatory Visit: Payer: Medicare HMO | Admitting: Urology

## 2021-04-18 VITALS — BP 142/80 | HR 74 | Ht 70.0 in | Wt 232.0 lb

## 2021-04-18 DIAGNOSIS — R3915 Urgency of urination: Secondary | ICD-10-CM

## 2021-04-18 DIAGNOSIS — R300 Dysuria: Secondary | ICD-10-CM | POA: Diagnosis not present

## 2021-04-18 DIAGNOSIS — R3129 Other microscopic hematuria: Secondary | ICD-10-CM

## 2021-04-18 LAB — MICROSCOPIC EXAMINATION: Bacteria, UA: NONE SEEN

## 2021-04-18 LAB — URINALYSIS, COMPLETE
Bilirubin, UA: NEGATIVE
Glucose, UA: NEGATIVE
Ketones, UA: NEGATIVE
Leukocytes,UA: NEGATIVE
Nitrite, UA: NEGATIVE
Protein,UA: NEGATIVE
Specific Gravity, UA: 1.01 (ref 1.005–1.030)
Urobilinogen, Ur: 2 mg/dL — ABNORMAL HIGH (ref 0.2–1.0)
pH, UA: 6 (ref 5.0–7.5)

## 2021-04-18 MED ORDER — TAMSULOSIN HCL 0.4 MG PO CAPS: 0.4000 mg | ORAL_CAPSULE | Freq: Every day | ORAL | 0 refills | Status: DC

## 2021-04-18 NOTE — Progress Notes (Signed)
   04/18/21  CC: No chief complaint on file.   HPI: 70 y.o. male with metastatic prostate cancer, storage related voiding symptoms and microhematuria.  See my previous note 04/03/2021.  Voiding symptoms on Gemtesa have improved though still has penile pain at the end of urination.  CT abdomen pelvis 04/12/2021 showed nonspecific bladder wall thickening  Refer to rooming tab for vitals NED. A&Ox3.   No respiratory distress   Abd soft, NT, ND Normal phallus with bilateral descended testicles  Cystoscopy Procedure Note  Patient identification was confirmed, informed consent was obtained, and patient was prepped using Betadine solution.  Lidocaine jelly was administered per urethral meatus.     Pre-Procedure: - Inspection reveals a normal caliber urethral meatus.  Procedure: The flexible cystoscope was introduced without difficulty - No urethral strictures/lesions are present. -  Nonocclusive  prostate  - Normal bladder neck - Bilateral ureteral orifices identified - Bladder mucosa  reveals no findings suspicious for invasion of bladder with prostate cancer.  Inflammatory changes at bladder neck - No bladder stones - No trabeculation  Retroflexion shows inflammatory changes bladder neck   Post-Procedure: - Patient tolerated the procedure well  Assessment/ Plan: Voiding symptoms improved on Gemtesa Mild inflammatory changes bladder neck Add tamsulosin 0.4 mg daily PA follow-up 1 month symptom check    Abbie Sons, MD

## 2021-04-22 ENCOUNTER — Inpatient Hospital Stay (HOSPITAL_BASED_OUTPATIENT_CLINIC_OR_DEPARTMENT_OTHER): Payer: Medicare HMO | Admitting: Oncology

## 2021-04-22 ENCOUNTER — Inpatient Hospital Stay: Payer: Medicare HMO

## 2021-04-22 ENCOUNTER — Other Ambulatory Visit: Payer: Self-pay

## 2021-04-22 ENCOUNTER — Encounter: Payer: Self-pay | Admitting: Oncology

## 2021-04-22 ENCOUNTER — Inpatient Hospital Stay: Payer: Medicare HMO | Attending: Oncology

## 2021-04-22 VITALS — BP 140/70 | HR 55 | Temp 96.9°F | Resp 18 | Wt 234.9 lb

## 2021-04-22 DIAGNOSIS — Z801 Family history of malignant neoplasm of trachea, bronchus and lung: Secondary | ICD-10-CM | POA: Insufficient documentation

## 2021-04-22 DIAGNOSIS — Z8042 Family history of malignant neoplasm of prostate: Secondary | ICD-10-CM | POA: Diagnosis not present

## 2021-04-22 DIAGNOSIS — Z7189 Other specified counseling: Secondary | ICD-10-CM

## 2021-04-22 DIAGNOSIS — D72819 Decreased white blood cell count, unspecified: Secondary | ICD-10-CM | POA: Diagnosis not present

## 2021-04-22 DIAGNOSIS — Z803 Family history of malignant neoplasm of breast: Secondary | ICD-10-CM | POA: Insufficient documentation

## 2021-04-22 DIAGNOSIS — C7951 Secondary malignant neoplasm of bone: Secondary | ICD-10-CM | POA: Insufficient documentation

## 2021-04-22 DIAGNOSIS — I10 Essential (primary) hypertension: Secondary | ICD-10-CM | POA: Insufficient documentation

## 2021-04-22 DIAGNOSIS — D696 Thrombocytopenia, unspecified: Secondary | ICD-10-CM | POA: Diagnosis not present

## 2021-04-22 DIAGNOSIS — C61 Malignant neoplasm of prostate: Secondary | ICD-10-CM | POA: Diagnosis not present

## 2021-04-22 DIAGNOSIS — D500 Iron deficiency anemia secondary to blood loss (chronic): Secondary | ICD-10-CM

## 2021-04-22 DIAGNOSIS — Z95828 Presence of other vascular implants and grafts: Secondary | ICD-10-CM

## 2021-04-22 LAB — CBC WITH DIFFERENTIAL/PLATELET
Abs Immature Granulocytes: 0 10*3/uL (ref 0.00–0.07)
Basophils Absolute: 0 10*3/uL (ref 0.0–0.1)
Basophils Relative: 1 %
Eosinophils Absolute: 0.1 10*3/uL (ref 0.0–0.5)
Eosinophils Relative: 2 %
HCT: 35 % — ABNORMAL LOW (ref 39.0–52.0)
Hemoglobin: 12.2 g/dL — ABNORMAL LOW (ref 13.0–17.0)
Immature Granulocytes: 0 %
Lymphocytes Relative: 26 %
Lymphs Abs: 0.7 10*3/uL (ref 0.7–4.0)
MCH: 32.4 pg (ref 26.0–34.0)
MCHC: 34.9 g/dL (ref 30.0–36.0)
MCV: 93.1 fL (ref 80.0–100.0)
Monocytes Absolute: 0.2 10*3/uL (ref 0.1–1.0)
Monocytes Relative: 7 %
Neutro Abs: 1.7 10*3/uL (ref 1.7–7.7)
Neutrophils Relative %: 64 %
Platelets: 110 10*3/uL — ABNORMAL LOW (ref 150–400)
RBC: 3.76 MIL/uL — ABNORMAL LOW (ref 4.22–5.81)
RDW: 13 % (ref 11.5–15.5)
WBC: 2.6 10*3/uL — ABNORMAL LOW (ref 4.0–10.5)
nRBC: 0 % (ref 0.0–0.2)

## 2021-04-22 LAB — COMPREHENSIVE METABOLIC PANEL
ALT: 9 U/L (ref 0–44)
AST: 16 U/L (ref 15–41)
Albumin: 3.5 g/dL (ref 3.5–5.0)
Alkaline Phosphatase: 60 U/L (ref 38–126)
Anion gap: 4 — ABNORMAL LOW (ref 5–15)
BUN: 10 mg/dL (ref 8–23)
CO2: 28 mmol/L (ref 22–32)
Calcium: 8.3 mg/dL — ABNORMAL LOW (ref 8.9–10.3)
Chloride: 105 mmol/L (ref 98–111)
Creatinine, Ser: 1.12 mg/dL (ref 0.61–1.24)
GFR, Estimated: >60 mL/min (ref >60)
Glucose, Bld: 89 mg/dL (ref 70–99)
Potassium: 4.1 mmol/L (ref 3.5–5.1)
Sodium: 137 mmol/L (ref 135–145)
Total Bilirubin: 0.8 mg/dL (ref 0.3–1.2)
Total Protein: 5.8 g/dL — ABNORMAL LOW (ref 6.5–8.1)

## 2021-04-22 LAB — PSA: Prostatic Specific Antigen: 0.41 ng/mL (ref 0.00–4.00)

## 2021-04-22 MED ORDER — HEPARIN SOD (PORK) LOCK FLUSH 100 UNIT/ML IV SOLN
500.0000 [IU] | Freq: Once | INTRAVENOUS | Status: DC
  Filled 2021-04-22: qty 5

## 2021-04-22 MED ORDER — HEPARIN SOD (PORK) LOCK FLUSH 100 UNIT/ML IV SOLN
INTRAVENOUS | Status: AC
  Administered 2021-04-22: 500 [IU]
  Filled 2021-04-22: qty 5

## 2021-04-22 MED ORDER — LEUPROLIDE ACETATE (6 MONTH) 45 MG ~~LOC~~ KIT
45.0000 mg | PACK | Freq: Once | SUBCUTANEOUS | Status: AC
  Administered 2021-04-22: 45 mg via SUBCUTANEOUS
  Filled 2021-04-22: qty 45

## 2021-04-22 MED ORDER — SODIUM CHLORIDE 0.9% FLUSH
10.0000 mL | Freq: Once | INTRAVENOUS | Status: AC
  Administered 2021-04-22: 10 mL via INTRAVENOUS
  Filled 2021-04-22: qty 10

## 2021-04-22 NOTE — Progress Notes (Signed)
Pt states he can go two weeks wo having a BM, used to take laclutose but not able to have a refill per pt. Will like to know if we can send in a rx. As well as, having UTI sx. Burning, pain, and frequency.

## 2021-04-23 ENCOUNTER — Encounter: Payer: Self-pay | Admitting: Oncology

## 2021-04-23 NOTE — Progress Notes (Signed)
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  He has not had any ascites or labs suggestive of chronic liver disease.  He does not drink alcohol anymore.  Patient was seen by scale for ongoing urinary symptoms of frequency especially at night.  This was followed by a CT renal stone study which showed cyst in the upper pole of the right kidney as well as left kidney.  S/p gastric bypass.  Pelvic node enlargement in the right pelvis 11 mm.  Bulky rounded lymph node along the external iliac chain 17 mm.  Prostate was enlarged measuring 4.3 cm.  Right obturator lymph node 1 cm.  Sclerosis of L3.  Left hemisacrum with sclerotic lesion.  Sclerosis of the superior pubic rami.  Large area of sclerosis affecting the right hemisacrum 4.7 x 2.4 cm.  Left ninth and 11th rib with sclerotic lesions compatible with bone metastases.  Right hip sclerosis.  PSA was elevated at 18.6.   Patient underwent 12 core prostate biopsy which showed adenocarcinoma Gleason score 9(4+5) grade group 5.    Patient is currently on Lupron and finished 6 cycles of docetaxel chemotherapy In September 2021.    Interval history-patient was seen by Dr. Bernardo Heater for symptoms with  difficulty passing urine as well as lower abdominal pain.  He still has on and off tearing in his eyes.  He has not followed up with his primary care doctor for a while as well  ECOG PS- 1 Pain scale- 0   Review of systems- Review of Systems  Constitutional:  Positive for malaise/fatigue. Negative for chills, fever and weight loss.  HENT:  Negative for congestion, ear discharge and nosebleeds.   Eyes:  Negative for blurred vision.  Respiratory:  Negative for cough, hemoptysis, sputum production, shortness of breath and wheezing.   Cardiovascular:  Negative for chest pain, palpitations, orthopnea and claudication.  Gastrointestinal:  Negative for abdominal pain, blood in stool, constipation, diarrhea, heartburn, melena, nausea and vomiting.  Genitourinary:  Negative for dysuria, flank pain, frequency, hematuria and urgency.  Musculoskeletal:  Negative for back pain, joint pain and myalgias.  Skin:  Negative for rash.  Neurological:  Negative for dizziness, tingling, focal weakness, seizures, weakness and headaches.  Endo/Heme/Allergies:  Does not bruise/bleed easily.  Psychiatric/Behavioral:  Negative for depression and suicidal ideas. The patient does not have insomnia.      No Known Allergies   Past Medical History:  Diagnosis Date   Anemia    Arthritis    knees, shoulders   CAD (coronary artery disease)    No Stents Present- per patient   Cancer ([place])    Cirrhosis ([place])    mild   Colon polyps    Essential hypertension 08/24/2008   GERD (gastroesophageal reflux disease)    patient denies   History of alcohol abuse Last Drink- 2011  History of gout    Hypertension    no longer on meds   Hypothyroidism    Intestinal ulcer    Iron deficiency anemia due to chronic blood loss 02/27/2017   Other pancytopenia ([place]) 02/25/2017   Overactive bladder    Sleep apnea    improved since gastric bypass   Thyroid disease      Past Surgical History:  Procedure Laterality Date    BILATERAL TOTAL SHOULDER ARTHROPLASTY  1990's   CIRCUMCISION REVISION N/A 03/13/2017   Procedure: CIRCUMCISION REVISION;  Surgeon: Nickie Retort, MD;  Location: [facility];  Service: Urology;  Laterality: N/A;   COLONOSCOPY WITH PROPOFOL N/A 10/30/2016   Procedure: COLONOSCOPY WITH PROPOFOL;  Surgeon: Lucilla Lame, MD;  Location: [facility];  Service: Endoscopy;  Laterality: N/A;   ESOPHAGOGASTRODUODENOSCOPY (EGD) WITH PROPOFOL N/A 10/30/2016   Procedure: ESOPHAGOGASTRODUODENOSCOPY (EGD) WITH PROPOFOL;  Surgeon: Lucilla Lame, MD;  Location: [facility];  Service: Endoscopy;  Laterality: N/A;   ESOPHAGOGASTRODUODENOSCOPY (EGD) WITH PROPOFOL N/A 03/26/2017   Procedure: ESOPHAGOGASTRODUODENOSCOPY (EGD) WITH PROPOFOL;  Surgeon: Lucilla Lame, MD;  Location: [facility];  Service: Gastroenterology;  Laterality: N/A;  requests early   GASTRIC BYPASS  2016   Dr. Darnell Level- [facility], [place]   KNEE ARTHROSCOPY  1990s   KNEE ARTHROSCOPY W/ MENISCAL REPAIR Bilateral    KNEE ARTHROSCOPY WITH MEDIAL MENISECTOMY Left 04/21/2019   Procedure: KNEE ARTHROSCOPY WITH MEDIAL MENISECTOMY;  Surgeon: Lovell Sheehan, MD;  Location: [facility];  Service: Orthopedics;  Laterality: Left;   PORTA CATH INSERTION N/A 11/23/2019   Procedure: PORTA CATH INSERTION;  Surgeon: Algernon Huxley, MD;  Location: [facility];  Service: Cardiovascular;  Laterality: N/A;   TONSILLECTOMY  2004    Social History   Socioeconomic History   Marital status: Married    Spouse name: Not on file   Number of children: Not on file   Years of education: Not on file   Highest education level: Not on file  Occupational History   Not on file  Tobacco Use   Smoking status: Never   Smokeless tobacco: Current    Types: Chew  Vaping Use   Vaping Use: Never used  Substance and Sexual Activity   Alcohol use: No    Comment: Heavy ETOH in past- Last Drink 2011 per patient   Drug use: No   Sexual activity: Not Currently  Other  Topics Concern   Not on file  Social History Narrative   Not on file   Social Determinants of Health   Financial Resource Strain: Not on file  Food Insecurity: Not on file  Transportation Needs: Not on file  Physical Activity: Not on file  Stress: Not on file  Social Connections: Not on file  Intimate Partner Violence: Not on file    Family History  Problem Relation Age of Onset   Stroke Mother    Breast cancer Mother    Heart disease Father    Stroke Father    AAA (abdominal aortic aneurysm) Father    Lung cancer Paternal Grandmother    Prostate cancer Brother    Bladder Cancer Neg Hx    Kidney cancer Neg Hx      Current Outpatient Medications:    levothyroxine (SYNTHROID) 200 MCG tablet, Take 200 mcg by mouth daily., Disp: , Rfl:    nystatin cream (MYCOSTATIN), Apply 1 application topically 2 (two) times daily., Disp: , Rfl:    tamsulosin (FLOMAX) 0.4 MG CAPS capsule, Take  1 capsule (0.4 mg total) by mouth daily., Disp: 30 capsule, Rfl: 0   traZODone (DESYREL) 150 MG tablet, Take 4 tablets (600 mg total) by mouth at bedtime., Disp: 360 tablet, Rfl: 1   Vibegron (GEMTESA) 75 MG TABS, 0, Disp: 30 tablet, Rfl: 0   pantoprazole (PROTONIX) 40 MG tablet, Take 1 tablet (40 mg total) by mouth daily. (Patient not taking: Reported on 04/22/2021), Disp: 30 tablet, Rfl: 2 No current facility-administered medications for this visit.  Facility-Administered Medications Ordered in Other Visits:    sodium chloride flush (NS) 0.9 % injection 10 mL, 10 mL, Intravenous, PRN, Sindy Guadeloupe, MD, 10 mL at 09/11/20 1138  Physical exam:  Vitals:   04/22/21 1455  BP: 140/70  Pulse: (!) 55  Resp: 18  Temp: (!) 96.9 F (36.1 C)  SpO2: 98%  Weight: 234 lb 14.4 oz (106.5 kg)   Physical Exam Constitutional:      General: He is not in acute distress. Cardiovascular:     Rate and Rhythm: Normal rate and regular rhythm.     Heart sounds: Normal heart sounds.  Pulmonary:     Effort:  Pulmonary effort is normal.     Breath sounds: Normal breath sounds.  Abdominal:     General: Bowel sounds are normal.     Palpations: Abdomen is soft.  Skin:    General: Skin is warm and dry.  Neurological:     Mental Status: He is alert and oriented to person, place, and time.     CMP Latest Ref Rng & Units 04/22/2021  Glucose 70 - 99 mg/dL 89  BUN 8 - 23 mg/dL 10  Creatinine 0.61 - 1.24 mg/dL 1.12  Sodium 135 - 145 mmol/L 137  Potassium 3.5 - 5.1 mmol/L 4.1  Chloride 98 - 111 mmol/L 105  CO2 22 - 32 mmol/L 28  Calcium 8.9 - 10.3 mg/dL 8.3(L)  Total Protein 6.5 - 8.1 g/dL 5.8(L)  Total Bilirubin 0.3 - 1.2 mg/dL 0.8  Alkaline Phos 38 - 126 U/L 60  AST 15 - 41 U/L 16  ALT 0 - 44 U/L 9   CBC Latest Ref Rng & Units 04/22/2021  WBC 4.0 - 10.5 K/uL 2.6(L)  Hemoglobin 13.0 - 17.0 g/dL 12.2(L)  Hematocrit 39.0 - 52.0 % 35.0(L)  Platelets 150 - 400 K/uL 110(L)    No images are attached to the encounter.  NM Bone Scan Whole Body  Result Date: 04/16/2021 CLINICAL DATA:  History of prostate cancer. Suspected bony neoplasm. New onset bone pain. EXAM: NUCLEAR MEDICINE WHOLE BODY BONE SCAN TECHNIQUE: Whole body anterior and posterior images were obtained approximately 3 hours after intravenous injection of radiopharmaceutical. RADIOPHARMACEUTICALS:  20.6 mCi Technetium-40m MDP IV COMPARISON:  CT chest and abdomen 04/12/2021. CT cervical spine 10/29/2020. Bone scan 07/11/2020. FINDINGS: Bilateral renal function and excretion. Multifocal stable areas of increased activity noted throughout the cervical spine, lumbar spine, and right sacrum again noted. Increased activity again noted over the mid sternum and right scapula. These are consistent with known metastatic disease. Multifocal bilateral rib focal areas of increased activity are again noted. Stepladder left rib foci of increased activity most likely related to prior fractures. Stable increased activity noted over the right tibia  consistent with site of known remote fracture. IMPRESSION: 1. Multiple stable areas of increased activity consistent with known metastatic disease. 2. Step ladder foci of increased activity in the left ribs consistent with known left rib fractures. Stable increased activity right tibia consistent with known  remote fracture. Electronically Signed   By: Marcello Moores  Register M.D.   On: 04/16/2021 06:17   CT CHEST ABDOMEN PELVIS W CONTRAST  Result Date: 04/15/2021 CLINICAL DATA:  70 year old male with history of prostate cancer. Bone pain. EXAM: CT CHEST, ABDOMEN, AND PELVIS WITH CONTRAST TECHNIQUE: Multidetector CT imaging of the chest, abdomen and pelvis was performed following the standard protocol during bolus administration of intravenous contrast. CONTRAST:  43mL OMNIPAQUE IOHEXOL 350 MG/ML SOLN COMPARISON:  Chest CT 10/29/2020. CT the chest, abdomen and pelvis 07/16/2020. FINDINGS: CT CHEST FINDINGS Cardiovascular: Heart size is normal. There is no significant pericardial fluid, thickening or pericardial calcification. There is aortic atherosclerosis, as well as atherosclerosis of the great vessels of the mediastinum and the coronary arteries, including calcified atherosclerotic plaque in the left main, left anterior descending and left circumflex coronary arteries. Right internal jugular single-lumen porta cath with tip terminating in the mid superior vena cava. Mediastinum/Nodes: No pathologically enlarged mediastinal or hilar lymph nodes. Esophagus is unremarkable in appearance. No axillary lymphadenopathy. Lungs/Pleura: Subsolid nodule in the left lower lobe (axial image 83 of series 3), not confidently identified on the prior examination. A few scattered tiny 2-3 mm pulmonary nodules are noted in the periphery of the lungs, similar in retrospect compared to prior examinations, nonspecific, but statistically likely benign. No larger more suspicious appearing pulmonary nodules or masses are noted. Small  chronic left pleural effusion, decreased in size compared to the prior examination. A small amount of scarring is noted in the inferior segment of the lingula, stable compared to the prior study. No acute consolidative airspace disease. No right pleural effusion. Musculoskeletal: Numerous osseous lesions are again noted throughout the visualized axial and appendicular skeleton, similar to the prior study, compatible with widespread metastatic disease to the bones. The largest lesion in the thorax occupies the entire T6 vertebral body. No definite pathologic compression fractures are identified in the thoracic spine. Multiple old healed and healing left-sided rib fractures are noted. Old healed fracture of the lateral aspect of the right second rib also noted. CT ABDOMEN PELVIS FINDINGS Hepatobiliary: Liver has a shrunken appearance and nodular contour, indicative of underlying cirrhosis. No discrete cystic or solid hepatic lesions. No intra or extrahepatic biliary ductal dilatation. Amorphous intermediate attenuation material lying dependently in the gallbladder, presumably biliary sludge. Gallbladder is moderately distended. No gallbladder wall thickening or surrounding pericholecystic fluid or inflammatory changes to suggest an acute cholecystitis at this time. Pancreas: Mild diffuse pancreatic atrophy. No pancreatic mass. No pancreatic ductal dilatation. No pancreatic or peripancreatic fluid collections or inflammatory changes. Spleen: Unremarkable. Adrenals/Urinary Tract: Subcentimeter low-attenuation lesion in the upper pole of the left kidney, too small to characterize, but statistically likely to represent a tiny cyst. 2 cm low-attenuation lesion in the lateral aspect of the interpolar region of the right kidney, compatible with a small simple cyst. No aggressive renal lesions. No hydroureteronephrosis. Urinary bladder is nearly decompressed with some anterior and lateral bladder wall thickening, without  discrete bladder mass or mural nodularity. Bilateral adrenal glands are normal in appearance. Stomach/Bowel: Postoperative changes of Roux-en-Y gastric bypass. No pathologic dilatation of small bowel or colon. A few scattered colonic diverticulae are noted, without surrounding inflammatory changes to suggest an acute diverticulitis at this time. Normal appendix. Vascular/Lymphatic: Aortic atherosclerosis, without evidence of aneurysm or dissection in the abdominal or pelvic vasculature. No lymphadenopathy noted in the abdomen or pelvis. Reproductive: Prostate gland is diminutive. Seminal vesicles are unremarkable in appearance. Other: No significant volume of ascites.  No pneumoperitoneum. Musculoskeletal: Numerous sclerotic osseous lesions are again noted throughout the visualized axial and appendicular skeleton, compatible with widespread metastatic disease to the bones, largest of which is a lesion which occupies much of the mid and inferior aspect of the L1 vertebral body. No definite compression fractures are identified within the lumbar spine. No other displaced pathologic fractures are confidently identified in the visualized portions of the skeleton. IMPRESSION: 1. Widespread metastatic disease to the bones appears similar to the prior examination. 2. No definite extra skeletal metastatic disease confidently identified in the abdomen or pelvis. 3. Anterior and lateral bladder wall thickening which is likely accentuated by under distension of the urinary bladder. No discrete bladder wall mass is confidently identified. 4. New sub solid nodule in the left lower lobe, as above. This is nonspecific, potentially infectious or inflammatory in etiology, but attention on routine imaging follow-up is recommended to ensure the stability or regression of this finding. 5. Slight decreased size of small chronic left pleural effusion with multiple healed and healing left-sided rib fractures. 6. Morphologic changes in the  liver indicative of advanced cirrhosis. 7. Colonic diverticulosis without evidence of acute diverticulitis at this time. 8. Aortic atherosclerosis, in addition to left main and 2 vessel coronary artery disease. Please note that although the presence of coronary artery calcium documents the presence of coronary artery disease, the severity of this disease and any potential stenosis cannot be assessed on this non-gated CT examination. Assessment for potential risk factor modification, dietary therapy or pharmacologic therapy may be warranted, if clinically indicated. 9. Additional incidental findings, as above. Electronically Signed   By: Vinnie Langton M.D.   On: 04/15/2021 11:42     Assessment and plan- Patient is a 70 y.o. 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  PSA from today is pending with prior PSA or showing stable value of 0.28.  He will receive a 12-month dose of Lupron today.  Patient was also seen by Dr. Bernardo Heater from urology for his urinary tract symptoms as well as nonspecific bladder wall thickening noted on CT scan for which he underwent cystoscopy.  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  Discussed that prolonged use of ADT can lead to osteopenia and osteoporosis.  I will obtain a bone density scan prior to his next visit with me and see him back in 3 months with CBC with differential CMP and PSA.  Based on present scans he has stable disease after docetaxel and will continue ADT alone.   Visit Diagnosis 1. Prostate cancer metastatic to bone ([place])   2.  Goals of care, counseling/discussion      Dr. Randa Evens, MD, MPH [facility] [phone] 04/23/2021 12:31 PM

## 2021-04-26 ENCOUNTER — Telehealth: Payer: Self-pay | Admitting: *Deleted

## 2021-04-26 NOTE — Telephone Encounter (Signed)
Patient calling asking for another medication, per pt Flomax and Gemtesa not working, pt states he's in extreme pain. Advised Dr. Bernardo Heater not in office and it might be Monday before call can be returned.

## 2021-04-29 NOTE — Telephone Encounter (Signed)
Recc PA visit with ua and bladder scan

## 2021-05-09 IMAGING — CT CT CHEST-ABD-PELV W/ CM
3 of 5 series · 14 of 36 positions shown, 16 images · IV contrast (omnipaque)
Comparison: 04/13/2020

CLINICAL DATA: Metastatic prostate cancer restaging

EXAM:
CT CHEST, ABDOMEN, AND PELVIS WITH CONTRAST
TECHNIQUE: Multidetector CT imaging of the chest, abdomen and pelvis was
performed following the standard protocol during bolus
administration of intravenous contrast.
CONTRAST:  100mL OMNIPAQUE IOHEXOL 300 MG/ML SOLN, additional oral
enteric contrast

[Series 2: lungs cap 2.00 · axial · 0.89mm/px · z∈[-1188,-1134]mm · 2 of 175 slices shown]
[im 14/175  mediastinal]
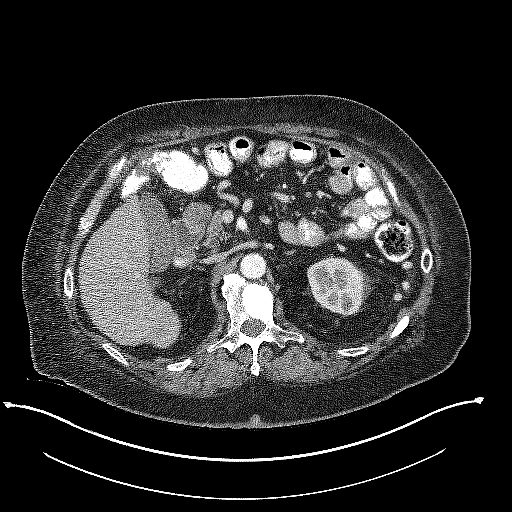
[im 41/175  mediastinal]
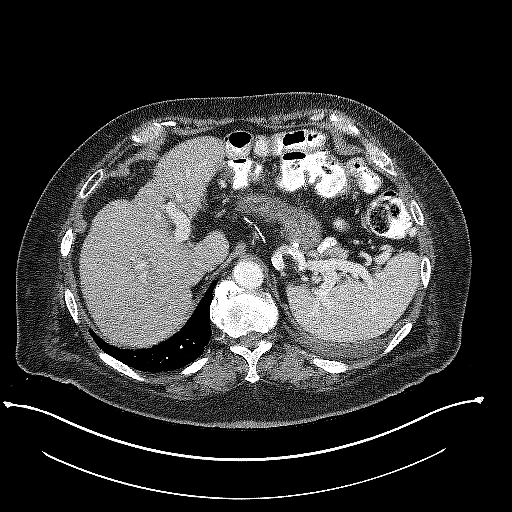

[Series 3: axials cap 5.00 · axial · 0.89mm/px · z∈[-1503,-943]mm · 9 of 142 slices shown, 11 images]
[im 15/142  mediastinal]
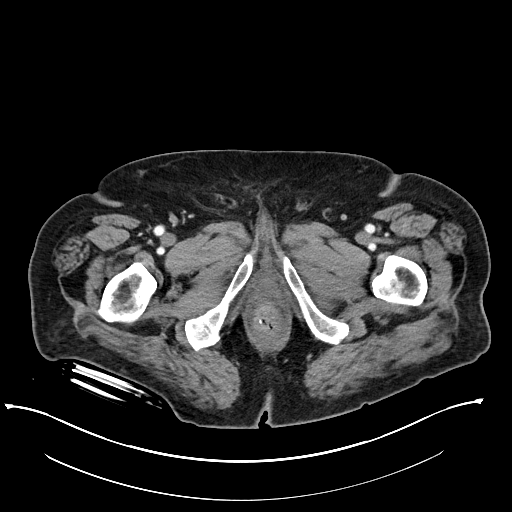
[im 15/142  bone]
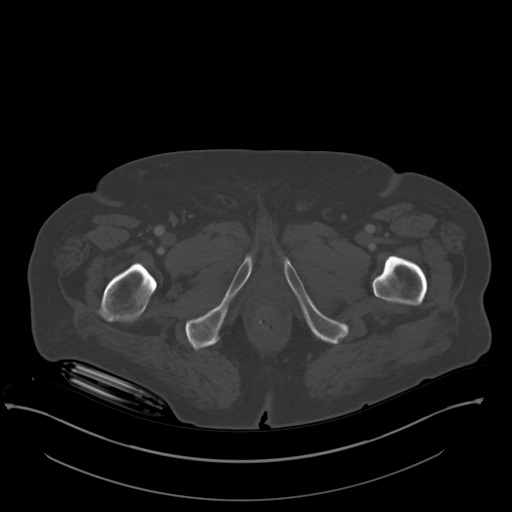
[im 29/142  mediastinal]
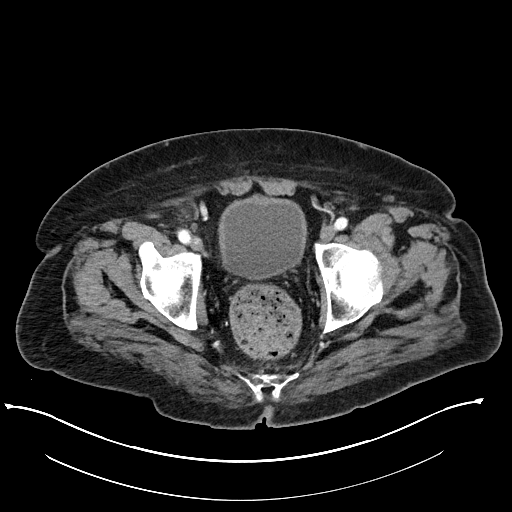
[im 43/142  mediastinal]
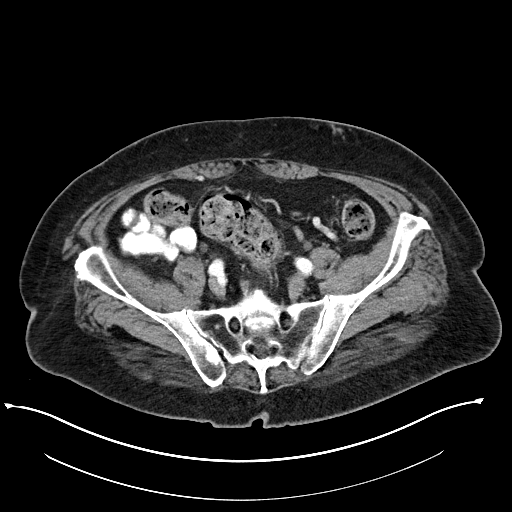
[im 57/142  mediastinal]
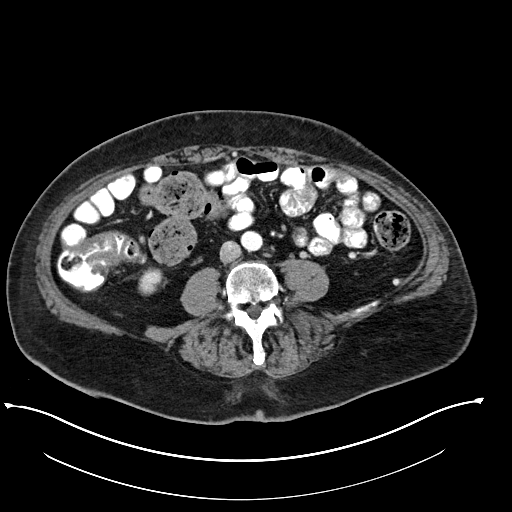
[im 71/142  mediastinal]
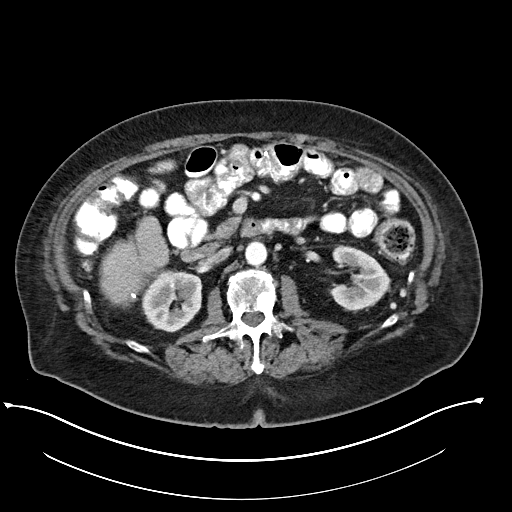
[im 85/142  mediastinal]
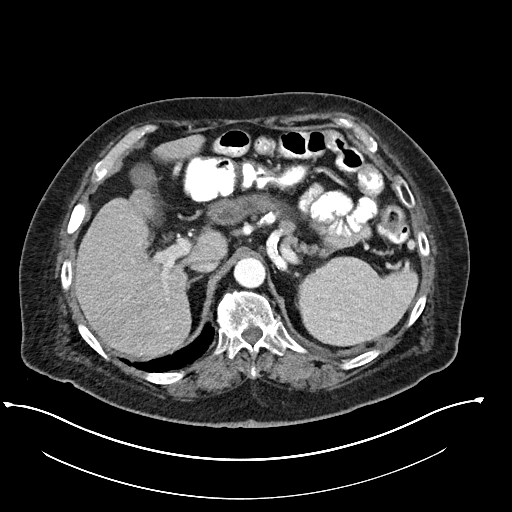
[im 99/142  mediastinal]
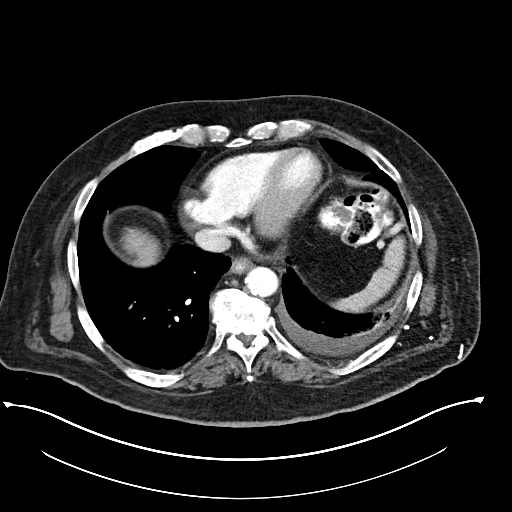
[im 113/142  mediastinal]
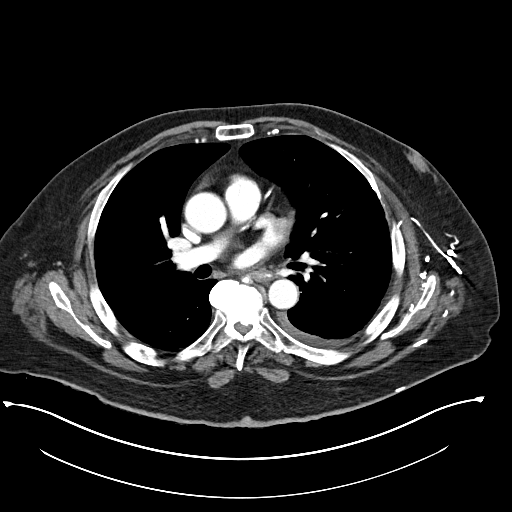
[im 127/142  mediastinal]
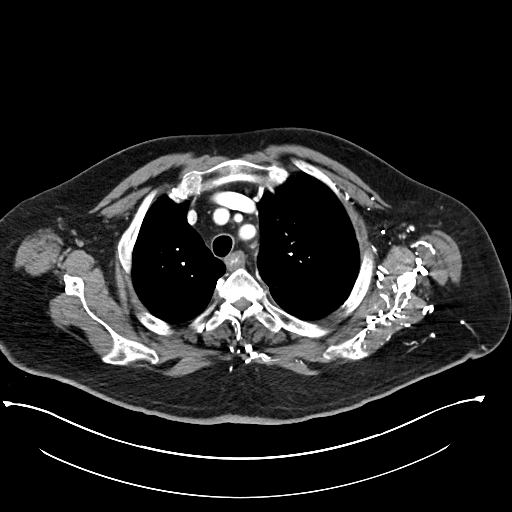
[im 127/142  bone]
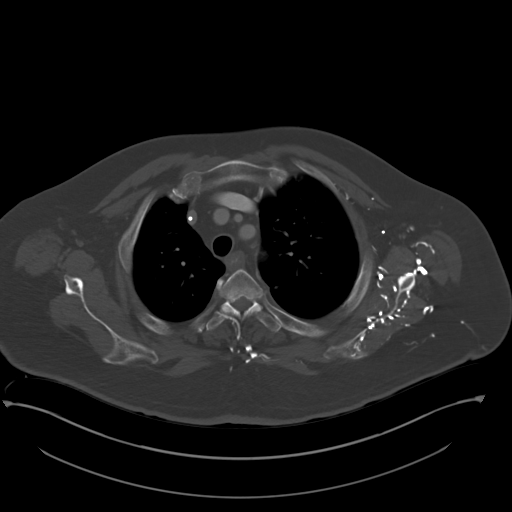

[Series 4: coronals cap 2.00 cor · coronal · 0.89mm/px · 3 of 152 slices shown]
[im 31/152  mediastinal]
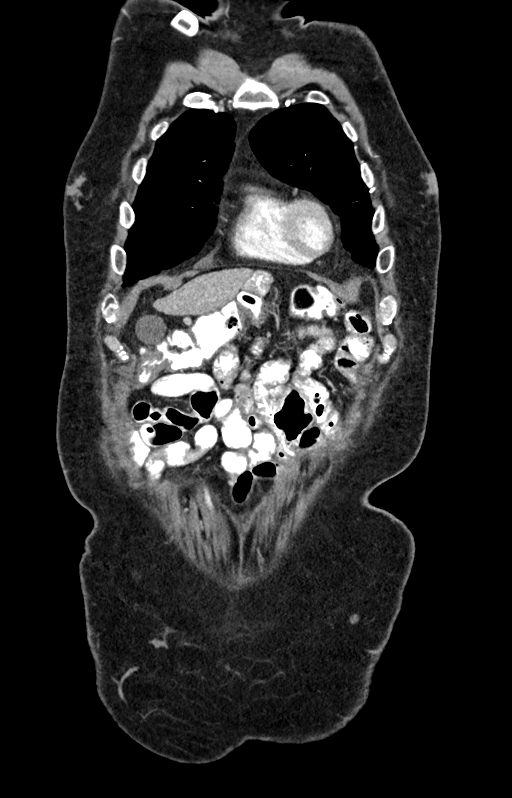
[im 61/152  mediastinal]
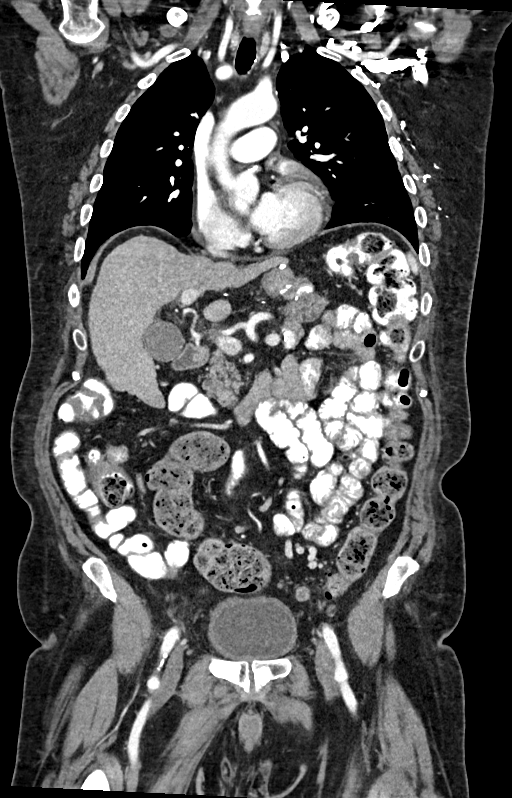
[im 91/152  mediastinal]
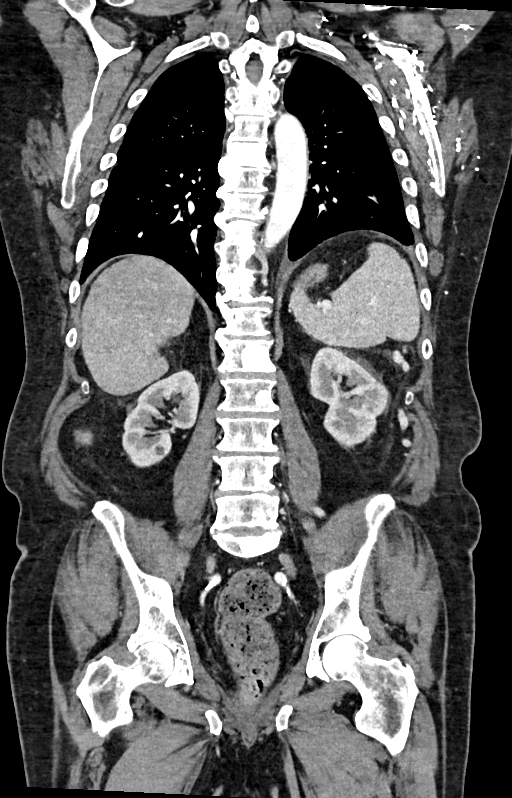

[14 of 36 positions shown; findings below may reference images not displayed]

FINDINGS: CT CHEST FINDINGS

Cardiovascular: Right chest port catheter. Normal heart size.
Three-vessel coronary artery calcifications. No pericardial
effusion.

Mediastinum/Nodes: No enlarged mediastinal, hilar, or axillary lymph
nodes. Thyroid gland, trachea, and esophagus demonstrate no
significant findings.

Lungs/Pleura: Small left pleural effusion, slightly increased
compared to prior examination. Irregular airspace opacities
throughout the lungs are significantly improved, nearly resolved
compared to prior examination, one notable opacity persisting in the
central left lower lobe, decreased in size and solid character,
measuring approximately 1.4 x 1.1 cm, previously 1.7 x 1.6 cm
(series 2, image 85).

Musculoskeletal: No chest wall mass. Unchanged sclerotic osseous
metastatic disease of the ribs and vertebral bodies, particularly of
the T6 vertebral body.

CT ABDOMEN PELVIS FINDINGS

Hepatobiliary: Somewhat shrunken, nodular appearance of the liver.
No focal liver abnormality is seen. No gallstones, gallbladder wall
thickening, or biliary dilatation.

Pancreas: Unremarkable. No pancreatic ductal dilatation or
surrounding inflammatory changes.

Spleen: Normal in size without significant abnormality.

Adrenals/Urinary Tract: Adrenal glands are unremarkable. Kidneys are
normal, without renal calculi, solid lesion, or hydronephrosis.
Bladder is unremarkable.

Stomach/Bowel: Status post Roux type gastric bypass. Appendix
appears normal. No evidence of bowel wall thickening, distention, or
inflammatory changes. Moderate burden of stool and stool balls in
the distal colon and rectum.

Vascular/Lymphatic: Scattered aortic atherosclerosis. No enlarged
abdominal or pelvic lymph nodes.

Reproductive: No mass or other abnormality.

Other: No abdominal wall hernia or abnormality. No abdominopelvic
ascites.

Musculoskeletal: No acute osseous findings. Unchanged sclerotic
osseous metastatic disease of the vertebral bodies and bony pelvis.
IMPRESSION: 1. Irregular airspace opacities throughout the lungs are
significantly improved, nearly resolved compared to prior
examination, one notable opacity persisting in the central left
lower lobe, decreased in size and solid character. Findings are most
consistent with subacute airspace disease, parenchymal pulmonary
metastases not favored in metastatic prostate cancer, although not
strictly excluded. Attention on follow-up.
2. No definite evidence of soft tissue metastatic disease within the
chest, abdomen or pelvis.
3. Unchanged sclerotic osseous metastatic disease.
4. Small left pleural effusion, slightly increased compared to prior
examination.
5. Somewhat shrunken, nodular appearance of the liver, suggesting
cirrhosis. Correlate with biochemical findings.
6. Coronary artery disease.

Aortic Atherosclerosis ([id number]).

## 2021-05-20 ENCOUNTER — Other Ambulatory Visit: Payer: Self-pay

## 2021-05-20 ENCOUNTER — Ambulatory Visit: Payer: Medicare HMO | Admitting: Physician Assistant

## 2021-05-20 ENCOUNTER — Encounter: Payer: Self-pay | Admitting: Physician Assistant

## 2021-05-20 VITALS — BP 113/76 | HR 63 | Ht 71.0 in | Wt 230.0 lb

## 2021-05-20 DIAGNOSIS — G893 Neoplasm related pain (acute) (chronic): Secondary | ICD-10-CM

## 2021-05-20 DIAGNOSIS — R3915 Urgency of urination: Secondary | ICD-10-CM

## 2021-05-20 LAB — URINALYSIS, COMPLETE
Bilirubin, UA: NEGATIVE
Glucose, UA: NEGATIVE
Ketones, UA: NEGATIVE
Nitrite, UA: NEGATIVE
Specific Gravity, UA: >1.03 — ABNORMAL HIGH (ref 1.005–1.030)
Urobilinogen, Ur: 1 mg/dL (ref 0.2–1.0)
pH, UA: 5.5 (ref 5.0–7.5)

## 2021-05-20 LAB — MICROSCOPIC EXAMINATION
Bacteria, UA: NONE SEEN
RBC, Urine: NONE SEEN /hpf (ref 0–2)

## 2021-05-20 LAB — BLADDER SCAN AMB NON-IMAGING

## 2021-05-20 MED ORDER — MIRABEGRON ER 50 MG PO TB24: 50.0000 mg | ORAL_TABLET | Freq: Every day | ORAL | 0 refills | Status: DC

## 2021-05-20 MED ORDER — OXYCODONE-ACETAMINOPHEN 5-325 MG PO TABS: 1.0000 | ORAL_TABLET | Freq: Four times a day (QID) | ORAL | 0 refills | Status: AC | PRN

## 2021-05-20 NOTE — Progress Notes (Signed)
05/20/2021 10:21 AM   Dylan Ho [birth year] [id number]  CC: Chief Complaint  Patient presents with   Prostate Cancer   HPI: Dylan Ho is a 70 y.o. male with metastatic prostate cancer s/p 6 cycles of docetaxel on ADT with persistent storage related voiding symptoms and suprapubic pressure who presents today for symptom recheck on Gemtesa and Flomax.   Today he reports if anything, his symptoms have worsened on Gemtesa and Flomax.  He describes constant lower abdominal/suprapubic pressure and pain that radiates to the penis.  Per chart review, patient took Myrbetriq in 2018 with no symptomatic improvement.  In-office UA today positive for trace intact blood, 1+ protein, and trace leukocyte esterase; urine microscopy with 6-10 WBCs/HPF. PVR 16mL.  PMH: Past Medical History:  Diagnosis Date   Anemia    Arthritis    knees, shoulders   CAD (coronary artery disease)    No Stents Present- per patient   Cancer ([place])    Cirrhosis ([place])    mild   Colon polyps    Essential hypertension 08/24/2008   GERD (gastroesophageal reflux disease)    patient denies   History of alcohol abuse Last Drink- 2011   History of gout    Hypertension    no longer on meds   Hypothyroidism    Intestinal ulcer    Iron deficiency anemia due to chronic blood loss 02/27/2017   Other pancytopenia ([place]) 02/25/2017   Overactive bladder    Sleep apnea    improved since gastric bypass   Thyroid disease     Surgical History: Past Surgical History:  Procedure Laterality Date   BILATERAL TOTAL SHOULDER ARTHROPLASTY  1990's   CIRCUMCISION REVISION N/A 03/13/2017   Procedure: CIRCUMCISION REVISION;  Surgeon: Nickie Retort, MD;  Location: [facility];  Service: Urology;  Laterality: N/A;   COLONOSCOPY WITH PROPOFOL N/A 10/30/2016   Procedure: COLONOSCOPY WITH PROPOFOL;  Surgeon: Lucilla Lame, MD;  Location: [facility];  Service: Endoscopy;  Laterality: N/A;   ESOPHAGOGASTRODUODENOSCOPY (EGD)  WITH PROPOFOL N/A 10/30/2016   Procedure: ESOPHAGOGASTRODUODENOSCOPY (EGD) WITH PROPOFOL;  Surgeon: Lucilla Lame, MD;  Location: [facility];  Service: Endoscopy;  Laterality: N/A;   ESOPHAGOGASTRODUODENOSCOPY (EGD) WITH PROPOFOL N/A 03/26/2017   Procedure: ESOPHAGOGASTRODUODENOSCOPY (EGD) WITH PROPOFOL;  Surgeon: Lucilla Lame, MD;  Location: [place];  Service: Gastroenterology;  Laterality: N/A;  requests early   GASTRIC BYPASS  2016   Dr. Darnell Level- [facility] [place]   KNEE ARTHROSCOPY  1990s   KNEE ARTHROSCOPY W/ MENISCAL REPAIR Bilateral    KNEE ARTHROSCOPY WITH MEDIAL MENISECTOMY Left 04/21/2019   Procedure: KNEE ARTHROSCOPY WITH MEDIAL MENISECTOMY;  Surgeon: Lovell Sheehan, MD;  Location: [place];  Service: Orthopedics;  Laterality: Left;   PORTA CATH INSERTION N/A 11/23/2019   Procedure: PORTA CATH INSERTION;  Surgeon: Algernon Huxley, MD;  Location: [place] [facility];  Service: Cardiovascular;  Laterality: N/A;   TONSILLECTOMY  2004    Home Medications:  Allergies as of 05/20/2021   No Known Allergies      Medication List        Accurate as of May 20, 2021 10:21 AM. If you have any questions, ask your nurse or doctor.          Gemtesa 75 MG Tabs Generic drug: Vibegron 0   levothyroxine 200 MCG tablet Commonly known as: SYNTHROID Take 200 mcg by mouth daily.   nystatin cream Commonly known as: MYCOSTATIN Apply 1 application topically 2 (two) times  daily.   pantoprazole 40 MG tablet Commonly known as: Protonix Take 1 tablet (40 mg total) by mouth daily.   tamsulosin 0.4 MG Caps capsule Commonly known as: FLOMAX Take 1 capsule (0.4 mg total) by mouth daily.   traZODone 150 MG tablet Commonly known as: DESYREL Take 4 tablets (600 mg total) by mouth at bedtime.        Allergies:  No Known Allergies  Family History: Family History  Problem Relation Age of Onset   Stroke Mother    Breast cancer Mother    Heart disease Father     Stroke Father    AAA (abdominal aortic aneurysm) Father    Lung cancer Paternal Grandmother    Prostate cancer Brother    Bladder Cancer Neg Hx    Kidney cancer Neg Hx     Social History:   reports that he has never smoked. His smokeless tobacco use includes chew. He reports that he does not drink alcohol and does not use drugs.  Physical Exam: BP 113/76   Pulse 63   Ht 5\' 11"  (1.803 m)   Wt 230 lb (104.3 kg)   BMI 32.08 kg/m   Constitutional:  Alert and oriented, no acute distress, nontoxic appearing HEENT: [place], AT Cardiovascular: No clubbing, cyanosis, or edema Respiratory: Normal respiratory effort, no increased work of breathing Skin: No rashes, bruises or suspicious lesions Neurologic: Grossly intact, no focal deficits, moving all 4 extremities Psychiatric: Normal mood and affect  Laboratory Data: Results for orders placed or performed in visit on 05/20/21  Microscopic Examination   Urine  Result Value Ref Range   WBC, UA 6-10 (A) 0 - 5 /hpf   RBC None seen 0 - 2 /hpf   Epithelial Cells (non renal) CANCELED    Bacteria, UA None seen None seen/Few  Urinalysis, Complete  Result Value Ref Range   Specific Gravity, UA >1.030 (H) 1.005 - 1.030   pH, UA 5.5 5.0 - 7.5   Color, UA Yellow Yellow   Appearance Ur Hazy (A) Clear   Leukocytes,UA Trace (A) Negative   Protein,UA 1+ (A) Negative/Trace   Glucose, UA Negative Negative   Ketones, UA Negative Negative   RBC, UA Trace (A) Negative   Bilirubin, UA Negative Negative   Urobilinogen, Ur 1.0 0.2 - 1.0 mg/dL   Nitrite, UA Negative Negative   Microscopic Examination See below:   Bladder Scan (Post Void Residual) in office  Result Value Ref Range   Scan Result 59mL    Assessment & Plan:   1. Urinary urgency No symptomatic relief on Gemtesa.  He previously failed Myrbetriq, however his clinical picture has changed drastically since then and I think it would be worthwhile to attempt this again.  I provided him with 4  weeks of Myrbetriq 50 mg samples today and will plan for symptom recheck and PVR with Dr. Bernardo Heater in 1 month.  UA today is rather benign, will send for standard and atypical culture today to rule out underlying infection. - Urinalysis, Complete - Bladder Scan (Post Void Residual) in office - CULTURE, URINE COMPREHENSIVE - Mycoplasma / ureaplasma culture - mirabegron ER (MYRBETRIQ) 50 MG TB24 tablet; Take 1 tablet (50 mg total) by mouth daily.  Dispense: 28 tablet; Refill: 0  2. Cancer associated pain I am hesitant to start him on amitriptyline or Uribel due to concerns for increased risk for serotonin syndrome on trazodone.  We discussed starting him on B&O suppositories, however unfortunately this is going to be  cost prohibitive for him.  I provided him with a small volume of Percocet for breakthrough pain for now but we discussed that if he requires this on an ongoing basis, we may refer him to palliative care for ongoing management. - oxyCODONE-acetaminophen (PERCOCET/ROXICET) 5-325 MG tablet; Take 1 tablet by mouth every 6 (six) hours as needed for up to 5 days for severe pain.  Dispense: 12 tablet; Refill: 0   Return in about 4 weeks (around 06/17/2021) for Symptom recheck and PVR with Dr. Bernardo Heater.  Debroah Loop, PA-C  [facility] [address] [place], [place] [address] [phone]

## 2021-05-23 LAB — CULTURE, URINE COMPREHENSIVE

## 2021-05-27 LAB — MYCOPLASMA / UREAPLASMA CULTURE
Mycoplasma hominis Culture: NEGATIVE
Ureaplasma urealyticum: NEGATIVE

## 2021-05-28 ENCOUNTER — Telehealth: Payer: Self-pay

## 2021-05-28 ENCOUNTER — Telehealth: Payer: Self-pay | Admitting: *Deleted

## 2021-05-28 MED ORDER — OXYCODONE-ACETAMINOPHEN 5-325 MG PO TABS: 1.0000 | ORAL_TABLET | Freq: Four times a day (QID) | ORAL | 0 refills | Status: DC | PRN

## 2021-05-28 NOTE — Telephone Encounter (Signed)
-----   Message from Debroah Loop, [place] sent at 05/28/2021  3:20 PM EST ----- Urine testing negative for infection, keep follow-up with Dr. Bernardo Heater as scheduled. ----- Message ----- From: Lavone Neri Lab Results In Sent: 05/20/2021   4:36 PM EST To: Debroah Loop, PA-C

## 2021-05-28 NOTE — Telephone Encounter (Signed)
Notified patient as advised, patient expressed understanding.  

## 2021-05-28 NOTE — Telephone Encounter (Signed)
Patient is asking for pain medication . He states it makes him relax and he states he does not have pain in his bladder

## 2021-05-28 NOTE — Addendum Note (Signed)
Addended by: Abbie Sons on: 05/28/2021 03:47 PM   Modules accepted: Orders

## 2021-06-04 ENCOUNTER — Ambulatory Visit
Admission: RE | Admit: 2021-06-04 | Discharge: 2021-06-04 | Disposition: A | Discharge status: Home / Self Care | Payer: Medicare HMO | Source: Ambulatory Visit | Attending: Oncology | Admitting: Oncology

## 2021-06-04 ENCOUNTER — Other Ambulatory Visit: Payer: Self-pay

## 2021-06-04 DIAGNOSIS — Z13820 Encounter for screening for osteoporosis: Secondary | ICD-10-CM | POA: Diagnosis not present

## 2021-06-04 DIAGNOSIS — Z8546 Personal history of malignant neoplasm of prostate: Secondary | ICD-10-CM | POA: Diagnosis not present

## 2021-06-04 DIAGNOSIS — C7951 Secondary malignant neoplasm of bone: Secondary | ICD-10-CM | POA: Diagnosis present

## 2021-06-04 DIAGNOSIS — C61 Malignant neoplasm of prostate: Secondary | ICD-10-CM | POA: Diagnosis not present

## 2021-06-04 DIAGNOSIS — Z9221 Personal history of antineoplastic chemotherapy: Secondary | ICD-10-CM | POA: Diagnosis not present

## 2021-06-04 DIAGNOSIS — K7460 Unspecified cirrhosis of liver: Secondary | ICD-10-CM | POA: Diagnosis not present

## 2021-06-04 DIAGNOSIS — E039 Hypothyroidism, unspecified: Secondary | ICD-10-CM | POA: Insufficient documentation

## 2021-06-04 DIAGNOSIS — M85852 Other specified disorders of bone density and structure, left thigh: Secondary | ICD-10-CM | POA: Diagnosis not present

## 2021-06-17 ENCOUNTER — Ambulatory Visit (INDEPENDENT_AMBULATORY_CARE_PROVIDER_SITE_OTHER): Payer: Medicare HMO | Admitting: Urology

## 2021-06-17 ENCOUNTER — Other Ambulatory Visit: Payer: Self-pay

## 2021-06-17 ENCOUNTER — Encounter: Payer: Self-pay | Admitting: Urology

## 2021-06-17 VITALS — BP 140/70 | HR 62 | Ht 72.0 in | Wt 230.0 lb

## 2021-06-17 DIAGNOSIS — C61 Malignant neoplasm of prostate: Secondary | ICD-10-CM | POA: Diagnosis not present

## 2021-06-17 DIAGNOSIS — R102 Pelvic and perineal pain: Secondary | ICD-10-CM

## 2021-06-17 DIAGNOSIS — R399 Unspecified symptoms and signs involving the genitourinary system: Secondary | ICD-10-CM | POA: Diagnosis not present

## 2021-06-17 DIAGNOSIS — R3915 Urgency of urination: Secondary | ICD-10-CM

## 2021-06-17 LAB — BLADDER SCAN AMB NON-IMAGING: Scan Result: 0

## 2021-06-17 MED ORDER — SOLIFENACIN SUCCINATE 10 MG PO TABS: 10.0000 mg | ORAL_TABLET | Freq: Every day | ORAL | 1 refills | Status: DC

## 2021-06-17 MED ORDER — OXYCODONE HCL 5 MG PO TABS: 5.0000 mg | ORAL_TABLET | Freq: Four times a day (QID) | ORAL | 0 refills | Status: DC | PRN

## 2021-06-17 NOTE — Progress Notes (Signed)
06/17/2021 12:07 PM   [place] January 07, 1951 [id number]  Referring provider: Kirk Ruths, MD [facility] [facility] [place],  [place] [address]  Chief Complaint  Patient presents with   Elevated PSA    Urologic history:  1.  Prostate cancer Incidentally noted to have pelvic adenopathy and bony pelvic lesions on CT ordered for suprapubic pressure/storage related voiding symptoms April 2021 Bone scan multiple metastatic sites spine, ribs, right scapula, pelvis, right tibia PSA 18.6; DRE firm right prostate Biopsy 11/03/2019; prostate volume 37 g 12/12 cores positive; variable Gleason 3+3 to 4+5 Treated ADT +6 cycles docetaxel CT chest/abdomen/pelvis 04/12/2021 without evidence of soft tissue metastatic disease, stable sclerotic osseous metastatic disease  2.  Pelvic pain/LUTS Onset dysuria, frequency, urgency with urge incontinence and suprapubic/penile pain August 2022 Negative urinalysis/urine culture No significant improvement Gemtesa, Myrbetriq, discussed tamsulosin Cystoscopy 04/2021 with nonocclusive prostate, normal bladder mucosa   HPI: 70 y.o. male presents for follow-up.  Last PA visit 05/20/2021; given trial of Myrbetriq 50 mg which he states was not effective.  Has had improvement in his pain and voiding symptoms on oxycodone Remains with bothersome frequency, urgency with urge incontinence, penile pain and suprapubic pain Denies gross hematuria     PMH: Past Medical History:  Diagnosis Date   Anemia    Arthritis    knees, shoulders   CAD (coronary artery disease)    No Stents Present- per patient   Cancer ([place])    Cirrhosis ([place])    mild   Colon polyps    Essential hypertension 08/24/2008   GERD (gastroesophageal reflux disease)    patient denies   History of alcohol abuse Last Drink- 2011   History of gout    Hypertension    no longer on meds   Hypothyroidism    Intestinal ulcer    Iron deficiency anemia due to  chronic blood loss 02/27/2017   Other pancytopenia ([place]) 02/25/2017   Overactive bladder    Sleep apnea    improved since gastric bypass   Thyroid disease     Surgical History: Past Surgical History:  Procedure Laterality Date   BILATERAL TOTAL SHOULDER ARTHROPLASTY  1990's   CIRCUMCISION REVISION N/A 03/13/2017   Procedure: CIRCUMCISION REVISION;  Surgeon: Nickie Retort, MD;  Location: [facility];  Service: Urology;  Laterality: N/A;   COLONOSCOPY WITH PROPOFOL N/A 10/30/2016   Procedure: COLONOSCOPY WITH PROPOFOL;  Surgeon: Lucilla Lame, MD;  Location: [facility];  Service: Endoscopy;  Laterality: N/A;   ESOPHAGOGASTRODUODENOSCOPY (EGD) WITH PROPOFOL N/A 10/30/2016   Procedure: ESOPHAGOGASTRODUODENOSCOPY (EGD) WITH PROPOFOL;  Surgeon: Lucilla Lame, MD;  Location: [facility];  Service: Endoscopy;  Laterality: N/A;   ESOPHAGOGASTRODUODENOSCOPY (EGD) WITH PROPOFOL N/A 03/26/2017   Procedure: ESOPHAGOGASTRODUODENOSCOPY (EGD) WITH PROPOFOL;  Surgeon: Lucilla Lame, MD;  Location: [place];  Service: Gastroenterology;  Laterality: N/A;  requests early   GASTRIC BYPASS  2016   Dr. Darnell Level- [facility] [place]   KNEE ARTHROSCOPY  1990s   KNEE ARTHROSCOPY W/ MENISCAL REPAIR Bilateral    KNEE ARTHROSCOPY WITH MEDIAL MENISECTOMY Left 04/21/2019   Procedure: KNEE ARTHROSCOPY WITH MEDIAL MENISECTOMY;  Surgeon: Lovell Sheehan, MD;  Location: [place];  Service: Orthopedics;  Laterality: Left;   PORTA CATH INSERTION N/A 11/23/2019   Procedure: PORTA CATH INSERTION;  Surgeon: Algernon Huxley, MD;  Location: [place] [facility];  Service: Cardiovascular;  Laterality: N/A;   TONSILLECTOMY  2004    Home Medications:  Allergies as of 06/17/2021   No Known Allergies      Medication List        Accurate as of June 17, 2021 12:07 PM. If you have any questions, ask your nurse or doctor.          levothyroxine 200 MCG tablet Commonly known as: SYNTHROID Take 200 mcg by mouth  daily.   mirabegron ER 50 MG Tb24 tablet Commonly known as: MYRBETRIQ Take 1 tablet (50 mg total) by mouth daily.   nystatin cream Commonly known as: MYCOSTATIN Apply 1 application topically 2 (two) times daily.   oxyCODONE-acetaminophen 5-325 MG tablet Commonly known as: PERCOCET/ROXICET Take 1 tablet by mouth every 6 (six) hours as needed for severe pain.   pantoprazole 40 MG tablet Commonly known as: Protonix Take 1 tablet (40 mg total) by mouth daily.   tamsulosin 0.4 MG Caps capsule Commonly known as: FLOMAX Take 1 capsule (0.4 mg total) by mouth daily.   traZODone 150 MG tablet Commonly known as: DESYREL Take 4 tablets (600 mg total) by mouth at bedtime.        Allergies: No Known Allergies  Family History: Family History  Problem Relation Age of Onset   Stroke Mother    Breast cancer Mother    Heart disease Father    Stroke Father    AAA (abdominal aortic aneurysm) Father    Lung cancer Paternal Grandmother    Prostate cancer Brother    Bladder Cancer Neg Hx    Kidney cancer Neg Hx     Social History:  reports that he has never smoked. His smokeless tobacco use includes chew. He reports that he does not drink alcohol and does not use drugs.   Physical Exam: BP 140/70   Pulse 62   Ht 6' (1.829 m)   Wt 230 lb (104.3 kg)   BMI 31.19 kg/m   Constitutional:  Alert and oriented, No acute distress. HEENT: [place] AT, moist mucus membranes.  Trachea midline, no masses. Cardiovascular: No clubbing, cyanosis, or edema. Respiratory: Normal respiratory effort, no increased work of breathing. Psychiatric: Normal mood and affect.   Assessment & Plan:    1.  Lower urinary tract symptoms Bothersome storage related voiding symptoms No improvement on alpha blockade, beta 3 agonist Trial Solifenacin 10 mg daily Will discuss with Dr. Matilde Sprang regarding potential Botox if no improvement with anticholinergic medication  2.  Pelvic pain Unclear etiology of his  pain has no evidence of soft tissue metastasis on CT and PSA stable Will check with Dr. Janese Banks her thoughts regarding the value of a PSMA/PET Oxycodone refilled   Abbie Sons, MD  [facility] [address], [place], [place] [address] [phone]

## 2021-07-03 ENCOUNTER — Other Ambulatory Visit: Payer: Self-pay

## 2021-07-03 MED ORDER — OXYCODONE HCL 5 MG PO TABS: 5.0000 mg | ORAL_TABLET | Freq: Four times a day (QID) | ORAL | 0 refills | Status: DC | PRN

## 2021-07-03 NOTE — Telephone Encounter (Signed)
Incoming call from pt on triage line who states he would like a refill on oxycodone as it is the only thing that has helped his bladder. He also questions if he may have prostatitis as the urgency and frequency of urination have increased.  Secondly pt states that he has not been able to start Vesicare as the pharmacy states it requires a prior authorization and he has not heard an update on the status of PA.

## 2021-07-03 NOTE — Telephone Encounter (Signed)
Spoke to patient and informed him that I have not received any papers requesting PA for Vesicare. Patient states his insurance will change on Monday and he will wait until he gets his new insurance card before trying to get Vesicare. I also offered for him to come in and be seen for prostatitis. I informed him that we will need a urine. He states he will have to call back to get that scheduled because his work schedule is changing.

## 2021-07-03 NOTE — Telephone Encounter (Signed)
Has appointment oncology 1/17.  Oxycodone was refilled

## 2021-07-03 NOTE — Telephone Encounter (Signed)
Patient called back asking for refill on oxycodone. I advised pt he would need to be seen for reevaluation as his symptoms are not getting better. He declined appointments offered to him

## 2021-07-11 ENCOUNTER — Encounter: Payer: Self-pay | Admitting: Oncology

## 2021-07-11 ENCOUNTER — Telehealth: Payer: Self-pay | Admitting: Oncology

## 2021-07-11 NOTE — Telephone Encounter (Signed)
Pt called to see if he could be seen on 1-6. Has an appt on 1-17 but is in a great deal of pain. Call back at [phone]

## 2021-07-11 NOTE — Telephone Encounter (Signed)
See above note

## 2021-07-12 ENCOUNTER — Inpatient Hospital Stay: Payer: PPO

## 2021-07-12 ENCOUNTER — Other Ambulatory Visit: Payer: Self-pay

## 2021-07-12 ENCOUNTER — Inpatient Hospital Stay: Payer: PPO | Attending: Hospice and Palliative Medicine | Admitting: Hospice and Palliative Medicine

## 2021-07-12 VITALS — BP 146/71 | HR 59 | Temp 96.4°F | Resp 14 | Wt 234.0 lb

## 2021-07-12 DIAGNOSIS — C61 Malignant neoplasm of prostate: Secondary | ICD-10-CM | POA: Insufficient documentation

## 2021-07-12 DIAGNOSIS — C7951 Secondary malignant neoplasm of bone: Secondary | ICD-10-CM | POA: Insufficient documentation

## 2021-07-12 DIAGNOSIS — R399 Unspecified symptoms and signs involving the genitourinary system: Secondary | ICD-10-CM

## 2021-07-12 DIAGNOSIS — I10 Essential (primary) hypertension: Secondary | ICD-10-CM | POA: Insufficient documentation

## 2021-07-12 DIAGNOSIS — R102 Pelvic and perineal pain: Secondary | ICD-10-CM | POA: Insufficient documentation

## 2021-07-12 DIAGNOSIS — K5900 Constipation, unspecified: Secondary | ICD-10-CM | POA: Diagnosis not present

## 2021-07-12 DIAGNOSIS — R350 Frequency of micturition: Secondary | ICD-10-CM | POA: Diagnosis not present

## 2021-07-12 LAB — CBC WITH DIFFERENTIAL/PLATELET
Abs Immature Granulocytes: 0.01 10*3/uL (ref 0.00–0.07)
Basophils Absolute: 0 10*3/uL (ref 0.0–0.1)
Basophils Relative: 1 %
Eosinophils Absolute: 0.1 10*3/uL (ref 0.0–0.5)
Eosinophils Relative: 3 %
HCT: 39 % (ref 39.0–52.0)
Hemoglobin: 13.3 g/dL (ref 13.0–17.0)
Immature Granulocytes: 0 %
Lymphocytes Relative: 22 %
Lymphs Abs: 0.7 10*3/uL (ref 0.7–4.0)
MCH: 31.7 pg (ref 26.0–34.0)
MCHC: 34.1 g/dL (ref 30.0–36.0)
MCV: 92.9 fL (ref 80.0–100.0)
Monocytes Absolute: 0.2 10*3/uL (ref 0.1–1.0)
Monocytes Relative: 6 %
Neutro Abs: 2.1 10*3/uL (ref 1.7–7.7)
Neutrophils Relative %: 68 %
Platelets: 102 10*3/uL — ABNORMAL LOW (ref 150–400)
RBC: 4.2 MIL/uL — ABNORMAL LOW (ref 4.22–5.81)
RDW: 12.4 % (ref 11.5–15.5)
WBC: 3 10*3/uL — ABNORMAL LOW (ref 4.0–10.5)
nRBC: 0 % (ref 0.0–0.2)

## 2021-07-12 LAB — COMPREHENSIVE METABOLIC PANEL
ALT: 12 U/L (ref 0–44)
AST: 23 U/L (ref 15–41)
Albumin: 3.7 g/dL (ref 3.5–5.0)
Alkaline Phosphatase: 63 U/L (ref 38–126)
Anion gap: 6 (ref 5–15)
BUN: 13 mg/dL (ref 8–23)
CO2: 28 mmol/L (ref 22–32)
Calcium: 8.7 mg/dL — ABNORMAL LOW (ref 8.9–10.3)
Chloride: 104 mmol/L (ref 98–111)
Creatinine, Ser: 2.07 mg/dL — ABNORMAL HIGH (ref 0.61–1.24)
GFR, Estimated: 34 mL/min — ABNORMAL LOW (ref >60)
Glucose, Bld: 95 mg/dL (ref 70–99)
Potassium: 4.1 mmol/L (ref 3.5–5.1)
Sodium: 138 mmol/L (ref 135–145)
Total Bilirubin: 1.2 mg/dL (ref 0.3–1.2)
Total Protein: 6.2 g/dL — ABNORMAL LOW (ref 6.5–8.1)

## 2021-07-12 LAB — PSA: Prostatic Specific Antigen: 2.67 ng/mL (ref 0.00–4.00)

## 2021-07-12 LAB — URINALYSIS, COMPLETE (UACMP) WITH MICROSCOPIC
Bacteria, UA: NONE SEEN
Specific Gravity, Urine: 1.014 (ref 1.005–1.030)

## 2021-07-12 MED ORDER — LACTULOSE 20 GM/30ML PO SOLN: 15.0000 mL | Freq: Every day | ORAL | 2 refills | Status: AC | PRN

## 2021-07-12 MED ORDER — OXYCODONE HCL 5 MG PO TABS: 5.0000 mg | ORAL_TABLET | Freq: Four times a day (QID) | ORAL | 0 refills | Status: DC | PRN

## 2021-07-12 NOTE — Progress Notes (Signed)
Symptom Management Echo at [facility] Telephone:(336) [phone] [phone]  Patient Care Team: Kirk Ruths, MD as PCP - General (Internal Medicine) Nickie Retort, MD (Inactive) as Consulting Physician (Urology) Lucilla Lame, MD as Consulting Physician (Gastroenterology) Lovell Sheehan, MD as Consulting Physician (Orthopedic Surgery) Sindy Guadeloupe, MD as Consulting Physician (Oncology)   Name of the patient: Dylan Ho  [id number]  [birth year]   Date of visit: 07/12/21  Reason for Consult:  Dylan Ho is a 71 year old man with multiple medical problems including history of alcohol-related cirrhosis, gastric bypass, and metastatic prostate cancer.  Patient is status post 6 cycles of docetaxel chemotherapy in September 2021 and is now managed on Lupron.  Patient was last seen by Dr. Janese Ho on 04/22/2021 at which time he was having similar urinary problems with difficulty voiding.  He was being followed by Dr. Bernardo Ho.  CT of the chest, abdomen, and pelvis revealed stable disease.  PSA was also stable.  Patient received dose of Lupron with plan for 32-monthfollow-up.  He has had recent follow-up with Dr. SBernardo Ho who he saw on 06/17/2021 for follow-up.  Patient continued to have urinary frequency/urgency/urge incontinence and penile/suprapubic pain despite Myrbetriq and oxycodone.  Patient was started on solifenacin.  Utility of possible PSMA/PET was questioned given ongoing pelvic pain of unclear etiology.  [facility]/suprapubic pain and urinary frequency/urgency.  He says that he has not yet started the solifenacin as insurance denied the initial prescription.  He is managing with OTC Azo but does not find that effective.  He says the only thing that has really helped with the symptoms is the oxycodone, which she is taken primarily at night.  He denies fever or chills.  No  malodorous urine.  Endorses constipation and requests refill of his lactulose.  Denies any neurologic complaints. Denies recent fevers or illnesses. Denies any easy bleeding or bruising. Reports good appetite and denies weight loss. Denies chest pain. Denies any nausea, vomiting, or diarrhea. Patient offers no further specific complaints today.  PAST MEDICAL HISTORY: Past Medical History:  Diagnosis Date   Anemia    Arthritis    knees, shoulders   CAD (coronary artery disease)    No Stents Present- per patient   [place]    [place]    mild   Colon polyps    Essential hypertension 08/24/2008   GERD (gastroesophageal reflux disease)    patient denies   History of alcohol abuse Last Drink- 2011   History of gout    Hypertension    no longer on meds   Hypothyroidism    Intestinal ulcer    Iron deficiency anemia due to chronic blood loss 02/27/2017   Other pancytopenia ([place] 02/25/2017   Overactive bladder    Sleep apnea    improved since gastric bypass   Thyroid disease     PAST SURGICAL HISTORY:  Past Surgical History:  Procedure Laterality Date   BILATERAL TOTAL SHOULDER ARTHROPLASTY  1990'Dylan   CIRCUMCISION REVISION N/A 03/13/2017   Procedure: CIRCUMCISION REVISION;  Surgeon: BNickie Retort MD;  Location: [facility]S;  Service: Urology;  Laterality: N/A;   COLONOSCOPY WITH PROPOFOL N/A 10/30/2016   Procedure: COLONOSCOPY WITH PROPOFOL;  Surgeon: DLucilla Lame MD;  Location: [facility];  Service: Endoscopy;  Laterality: N/A;   ESOPHAGOGASTRODUODENOSCOPY (EGD) WITH PROPOFOL N/A 10/30/2016   Procedure: ESOPHAGOGASTRODUODENOSCOPY (EGD) WITH PROPOFOL;  Surgeon: DLucilla Lame MD;  Location: [facility];  Service: Endoscopy;  Laterality: N/A;   ESOPHAGOGASTRODUODENOSCOPY (EGD) WITH PROPOFOL N/A 03/26/2017   Procedure: ESOPHAGOGASTRODUODENOSCOPY (EGD) WITH PROPOFOL;  Surgeon: Lucilla Lame, MD;  Location: [place];  Service: Gastroenterology;  Laterality: N/A;   requests early   GASTRIC BYPASS  2016   Dr. Darnell Level- [facility] [place]   KNEE ARTHROSCOPY  1990s   KNEE ARTHROSCOPY W/ MENISCAL REPAIR Bilateral    KNEE ARTHROSCOPY WITH MEDIAL MENISECTOMY Left 04/21/2019   Procedure: KNEE ARTHROSCOPY WITH MEDIAL MENISECTOMY;  Surgeon: Lovell Sheehan, MD;  Location: [place];  Service: Orthopedics;  Laterality: Left;   PORTA CATH INSERTION N/A 11/23/2019   Procedure: PORTA CATH INSERTION;  Surgeon: Algernon Huxley, MD;  Location: [place] [facility];  Service: Cardiovascular;  Laterality: N/A;   TONSILLECTOMY  2004    HEMATOLOGY/ONCOLOGY HISTORY:  Oncology History  Prostate cancer metastatic to bone ([place])  11/16/2019 Initial Diagnosis   Prostate cancer metastatic to bone ([place])   12/16/2019 -  Chemotherapy   The patient had dexamethasone (DECADRON) 4 MG tablet, 8 mg, Oral, 2 times daily, 1 of 1 cycle, Start date: 11/16/2019, End date: -- pegfilgrastim (NEULASTA ONPRO KIT) injection 6 mg, 6 mg, Subcutaneous, Once, 6 of 6 cycles Administration: 6 mg (12/16/2019), 6 mg (01/06/2020), 6 mg (01/27/2020), 6 mg (02/17/2020), 6 mg (03/09/2020), 6 mg (03/30/2020) DOCEtaxel (TAXOTERE) 180 mg in sodium chloride 0.9 % 250 mL chemo infusion, 75 mg/m2 = 180 mg, Intravenous,  Once, 6 of 6 cycles Administration: 180 mg (12/16/2019), 180 mg (01/06/2020), 180 mg (01/27/2020), 180 mg (02/17/2020), 180 mg (03/09/2020), 180 mg (03/30/2020)   for chemotherapy treatment.       ALLERGIES:  has No Known Allergies.  MEDICATIONS:  Current Outpatient Medications  Medication Sig Dispense Refill   levothyroxine (SYNTHROID) 200 MCG tablet Take 200 mcg by mouth daily.     nystatin cream (MYCOSTATIN) Apply 1 application topically 2 (two) times daily.     oxyCODONE (OXY IR/ROXICODONE) 5 MG immediate release tablet Take 1 tablet (5 mg total) by mouth every 6 (six) hours as needed for severe pain. 30 tablet 0   pantoprazole (PROTONIX) 40 MG tablet Take 1 tablet (40 mg total) by mouth daily. 30 tablet 2    solifenacin (VESICARE) 10 MG tablet Take 1 tablet (10 mg total) by mouth daily. 30 tablet 1   tamsulosin (FLOMAX) 0.4 MG CAPS capsule Take 1 capsule (0.4 mg total) by mouth daily. 30 capsule 0   traZODone (DESYREL) 150 MG tablet Take 4 tablets (600 mg total) by mouth at bedtime. 360 tablet 1   No current facility-administered medications for this visit.   Facility-Administered Medications Ordered in Other Visits  Medication Dose Route Frequency Provider Last Rate Last Admin   sodium chloride flush (NS) 0.9 % injection 10 mL  10 mL Intravenous PRN Sindy Guadeloupe, MD   10 mL at 09/11/20 1138    VITAL SIGNS: There were no vitals taken for this visit. There were no vitals filed for this visit.  Estimated body mass index is 31.19 kg/m as calculated from the following:   Height as of 06/17/21: 6' (1.829 m).   Weight as of 06/17/21: 230 lb (104.3 kg).  LABS: CBC:    Component Value Date/Time   WBC 2.6 (L) 04/22/2021 1440   HGB 12.2 (L) 04/22/2021 1440   HGB 11.1 (L) 02/19/2017 1111   HCT 35.0 (L) 04/22/2021 1440   HCT 35.8 (L) 02/19/2017 1111   PLT 110 (  L) 04/22/2021 1440   PLT 99 (LL) 02/19/2017 1111   MCV 93.1 04/22/2021 1440   MCV 89 02/19/2017 1111   MCV 88 07/15/2013 1327   NEUTROABS 1.7 04/22/2021 1440   NEUTROABS 1.8 11/19/2016 1113   NEUTROABS 5.0 07/15/2013 1327   LYMPHSABS 0.7 04/22/2021 1440   LYMPHSABS 0.8 11/19/2016 1113   LYMPHSABS 1.6 07/15/2013 1327   MONOABS 0.2 04/22/2021 1440   MONOABS 0.5 07/15/2013 1327   EOSABS 0.1 04/22/2021 1440   EOSABS 0.1 11/19/2016 1113   EOSABS 0.1 07/15/2013 1327   BASOSABS 0.0 04/22/2021 1440   BASOSABS 0.0 11/19/2016 1113   BASOSABS 0.1 07/15/2013 1327   Comprehensive Metabolic Panel:    Component Value Date/Time   NA 137 04/22/2021 1440   NA 145 (H) 07/09/2017 1500   NA 138 07/15/2013 1327   K 4.1 04/22/2021 1440   K 4.2 07/15/2013 1327   CL 105 04/22/2021 1440   CL 105 07/15/2013 1327   CO2 28 04/22/2021 1440    CO2 24 07/15/2013 1327   BUN 10 04/22/2021 1440   BUN 11 07/09/2017 1500   BUN 12 07/15/2013 1327   CREATININE 1.12 04/22/2021 1440   CREATININE 1.08 07/15/2013 1327   GLUCOSE 89 04/22/2021 1440   GLUCOSE 78 07/15/2013 1327   CALCIUM 8.3 (L) 04/22/2021 1440   CALCIUM 8.8 07/15/2013 1327   AST 16 04/22/2021 1440   AST 35 07/15/2013 1327   ALT 9 04/22/2021 1440   ALT 28 07/15/2013 1327   ALKPHOS 60 04/22/2021 1440   ALKPHOS 111 07/15/2013 1327   BILITOT 0.8 04/22/2021 1440   BILITOT 0.4 07/09/2017 1500   BILITOT 0.5 07/15/2013 1327   PROT 5.8 (L) 04/22/2021 1440   PROT 6.0 07/09/2017 1500   PROT 7.5 07/15/2013 1327   ALBUMIN 3.5 04/22/2021 1440   ALBUMIN 4.0 07/09/2017 1500   ALBUMIN 3.9 07/15/2013 1327    RADIOGRAPHIC STUDIES: No results found.  PERFORMANCE STATUS (ECOG) : 1 - Symptomatic but completely ambulatory  Review of Systems Unless otherwise noted, a complete review of systems is negative.  Physical Exam General: NAD Cardiovascular: regular rate and rhythm Pulmonary: clear anterior/posterior fields Abdomen: soft, nontender, + bowel sounds GU: no suprapubic tenderness Extremities: no edema, no joint deformities Skin: no rashes Neurological: Grossly nonfocal  Assessment and Plan- Patient is a 71 y.o. male with multiple medical problems including history of alcohol-related cirrhosis, gastric bypass, and metastatic prostate cancer.  Patient is status post 6 cycles of docetaxel chemotherapy in September 2021 and is now managed on Lupron.  Lower urinary tract symptoms -patient is being followed by urology for this.  Symptoms are chronic and unchanged in characterization.  He is nontoxic-appearing with no fever or chills.  Doubt infectious etiology but will send for UA/culture and check labs including CBC, CMP, and PSA.  Discussed with Dr. Janese Ho and will also order PSMA PET per recommendation of urology.  Patient was recently prescribed solifenacin but says that insurance  did not approve it.  Case also discussed with Dr. Bernardo Ho. Will provide patient with Goodrx coupon for Solifenacin.   Pelvic Pain - Patient is on oxycodone and says that this is the only thing really controlling his symptoms at this point.  This was last prescribed by Dr. Bernardo Ho on 07/03/2021 #30 tablets.  Patient request refill.  Patient says he is only taking it nightly and I encouraged him to maximize frequency to better control symptoms.  Constipation -patient request refill of lactulose, which he describes  as controlling his constipation well.  Case and plan discussed with Dr. Janese Ho.  Patient will return to clinic to see her as previously scheduled on 1/17 or sooner if needed  Patient expressed understanding and was in agreement with this plan. He also understands that He can call clinic at any time with any questions, concerns, or complaints.   Thank you for allowing me to participate in the care of this very pleasant patient.   Time Total: 30 minutes  Visit consisted of counseling and education dealing with the complex and emotionally intense issues of symptom management in the setting of serious illness.Greater than 50%  of this time was spent counseling and coordinating care related to the above assessment and plan.  Signed by: Altha Harm, PhD, NP-C

## 2021-07-12 NOTE — Progress Notes (Signed)
Pt reports increased pain to his pelvic area and urinary frequency. Pt states he is to the point that he is wearing diapers. Last saw urology on 06/17/21. Reports that symptoms have been going on since before that time, but urologist had no further explanation for his symptoms. Urology advised pt to follow-up with oncology.

## 2021-07-13 LAB — URINE CULTURE: Culture: NO GROWTH

## 2021-07-15 ENCOUNTER — Other Ambulatory Visit: Payer: Self-pay | Admitting: *Deleted

## 2021-07-15 DIAGNOSIS — C7951 Secondary malignant neoplasm of bone: Secondary | ICD-10-CM

## 2021-07-15 DIAGNOSIS — C61 Malignant neoplasm of prostate: Secondary | ICD-10-CM

## 2021-07-16 ENCOUNTER — Encounter: Payer: Self-pay | Admitting: Oncology

## 2021-07-17 ENCOUNTER — Other Ambulatory Visit: Payer: Self-pay | Admitting: Hospice and Palliative Medicine

## 2021-07-17 MED ORDER — OXYCODONE HCL 5 MG PO TABS: 5.0000 mg | ORAL_TABLET | ORAL | 0 refills | Status: DC | PRN

## 2021-07-20 ENCOUNTER — Encounter: Payer: Self-pay | Admitting: Oncology

## 2021-07-23 ENCOUNTER — Inpatient Hospital Stay (HOSPITAL_BASED_OUTPATIENT_CLINIC_OR_DEPARTMENT_OTHER): Payer: PPO | Admitting: Oncology

## 2021-07-23 ENCOUNTER — Inpatient Hospital Stay: Payer: PPO

## 2021-07-23 ENCOUNTER — Other Ambulatory Visit: Payer: Self-pay | Admitting: *Deleted

## 2021-07-23 ENCOUNTER — Ambulatory Visit: Payer: Medicare HMO

## 2021-07-23 ENCOUNTER — Encounter: Payer: Self-pay | Admitting: Oncology

## 2021-07-23 ENCOUNTER — Other Ambulatory Visit: Payer: Self-pay

## 2021-07-23 VITALS — BP 108/75 | HR 73 | Temp 98.2°F | Resp 16 | Ht 72.0 in | Wt 228.0 lb

## 2021-07-23 DIAGNOSIS — C61 Malignant neoplasm of prostate: Secondary | ICD-10-CM

## 2021-07-23 DIAGNOSIS — C7951 Secondary malignant neoplasm of bone: Secondary | ICD-10-CM

## 2021-07-23 LAB — CBC WITH DIFFERENTIAL/PLATELET
Abs Immature Granulocytes: 0.01 10*3/uL (ref 0.00–0.07)
Basophils Absolute: 0 10*3/uL (ref 0.0–0.1)
Basophils Relative: 1 %
Eosinophils Absolute: 0.1 10*3/uL (ref 0.0–0.5)
Eosinophils Relative: 2 %
HCT: 38.8 % — ABNORMAL LOW (ref 39.0–52.0)
Hemoglobin: 13.5 g/dL (ref 13.0–17.0)
Immature Granulocytes: 0 %
Lymphocytes Relative: 17 %
Lymphs Abs: 0.6 10*3/uL — ABNORMAL LOW (ref 0.7–4.0)
MCH: 32.1 pg (ref 26.0–34.0)
MCHC: 34.8 g/dL (ref 30.0–36.0)
MCV: 92.4 fL (ref 80.0–100.0)
Monocytes Absolute: 0.3 10*3/uL (ref 0.1–1.0)
Monocytes Relative: 7 %
Neutro Abs: 2.6 10*3/uL (ref 1.7–7.7)
Neutrophils Relative %: 73 %
Platelets: 118 10*3/uL — ABNORMAL LOW (ref 150–400)
RBC: 4.2 MIL/uL — ABNORMAL LOW (ref 4.22–5.81)
RDW: 12.6 % (ref 11.5–15.5)
WBC: 3.6 10*3/uL — ABNORMAL LOW (ref 4.0–10.5)
nRBC: 0 % (ref 0.0–0.2)

## 2021-07-23 LAB — COMPREHENSIVE METABOLIC PANEL
ALT: 9 U/L (ref 0–44)
AST: 19 U/L (ref 15–41)
Albumin: 3.7 g/dL (ref 3.5–5.0)
Alkaline Phosphatase: 63 U/L (ref 38–126)
Anion gap: 9 (ref 5–15)
BUN: 11 mg/dL (ref 8–23)
CO2: 26 mmol/L (ref 22–32)
Calcium: 8.8 mg/dL — ABNORMAL LOW (ref 8.9–10.3)
Chloride: 102 mmol/L (ref 98–111)
Creatinine, Ser: 1.61 mg/dL — ABNORMAL HIGH (ref 0.61–1.24)
GFR, Estimated: 46 mL/min — ABNORMAL LOW (ref >60)
Glucose, Bld: 107 mg/dL — ABNORMAL HIGH (ref 70–99)
Potassium: 3.7 mmol/L (ref 3.5–5.1)
Sodium: 137 mmol/L (ref 135–145)
Total Bilirubin: 0.9 mg/dL (ref 0.3–1.2)
Total Protein: 6.2 g/dL — ABNORMAL LOW (ref 6.5–8.1)

## 2021-07-23 LAB — PSA: Prostatic Specific Antigen: 3.12 ng/mL (ref 0.00–4.00)

## 2021-07-23 MED ORDER — SODIUM CHLORIDE 0.9% FLUSH
10.0000 mL | Freq: Once | INTRAVENOUS | Status: AC
  Administered 2021-07-23: 10 mL via INTRAVENOUS
  Filled 2021-07-23: qty 10

## 2021-07-23 MED ORDER — HEPARIN SOD (PORK) LOCK FLUSH 100 UNIT/ML IV SOLN
500.0000 [IU] | Freq: Once | INTRAVENOUS | Status: AC
  Administered 2021-07-23: 500 [IU] via INTRAVENOUS
  Filled 2021-07-23: qty 5

## 2021-07-23 NOTE — Telephone Encounter (Signed)
Error, pt does not need pain meds now

## 2021-07-23 NOTE — Progress Notes (Signed)
Pt having lots of pain and the pain med calms it down but still pain all the time. He urinates every 3-5 min at times in the evening

## 2021-07-24 ENCOUNTER — Other Ambulatory Visit: Payer: Self-pay | Admitting: Hospice and Palliative Medicine

## 2021-07-25 ENCOUNTER — Encounter: Payer: Self-pay | Admitting: Oncology

## 2021-07-25 MED ORDER — OXYCODONE HCL 5 MG PO TABS: 5.0000 mg | ORAL_TABLET | ORAL | 0 refills | Status: DC | PRN

## 2021-07-25 NOTE — Progress Notes (Signed)
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  He has not had any ascites or labs suggestive of chronic liver disease.  He does not drink alcohol anymore.  Patient was seen by scale for ongoing urinary symptoms of frequency especially at night.  This was followed by a CT renal stone study which showed cyst in the upper pole of the right kidney as well as left kidney.  S/p gastric bypass.  Pelvic node enlargement in the right pelvis 11 mm.  Bulky rounded lymph node along the external iliac chain 17 mm.  Prostate was enlarged measuring 4.3 cm.  Right obturator lymph node 1 cm.  Sclerosis of L3.  Left hemisacrum with sclerotic lesion.  Sclerosis of the superior pubic rami.  Large area of sclerosis affecting the right hemisacrum 4.7 x 2.4 cm.  Left ninth and 11th rib with sclerotic lesions compatible with bone metastases.  Right hip sclerosis.  PSA was elevated at 18.6.   Patient underwent 12 core prostate biopsy which showed adenocarcinoma Gleason score 9(4+5) grade group 5.    Patient is currently on Lupron and finished 6 cycles of docetaxel chemotherapy In September 2021.      Interval history-patient continues to have penile pain as well as  lower abdominal pain which has become more frequent.  He is having to use oxycodone at least 2-3 times a day to control his pain.  He was seen by Dr. Bernardo Heater and his medications were adjusted but patient states that this has not been helping him.  ECOG PS- 1 Pain scale- 4   Review of systems- Review of Systems  Constitutional:  Positive for malaise/fatigue. Negative for chills, fever and weight loss.  HENT:  Negative for congestion, ear discharge and nosebleeds.   Eyes:  Negative for blurred vision.  Respiratory:  Negative for cough, hemoptysis, sputum production, shortness of breath and wheezing.   Cardiovascular:  Negative for chest pain, palpitations, orthopnea and claudication.  Gastrointestinal:  Negative for abdominal pain (LOWER ABDOMINAL PAIN AND PENILE PAIN), blood in stool, constipation, diarrhea, heartburn, melena, nausea and vomiting.  Genitourinary:  Negative for dysuria, flank pain, frequency, hematuria and urgency.  Musculoskeletal:  Negative for back pain, joint pain and myalgias.  Skin:  Negative for rash.  Neurological:  Negative for dizziness, tingling, focal weakness, seizures, weakness and headaches.  Endo/Heme/Allergies:  Does not bruise/bleed easily.  Psychiatric/Behavioral:  Negative for depression and suicidal ideas. The patient does not have insomnia.      No Known Allergies   Past Medical History:  Diagnosis Date   Anemia    Arthritis    knees, shoulders   CAD (coronary artery disease)    No Stents Present- per patient   Cancer ([place])    Cirrhosis ([place])    mild   Colon polyps    Essential hypertension 08/24/2008   GERD (  gastroesophageal reflux disease)    patient denies   History of alcohol abuse Last Drink- 2011   History of gout    Hypertension    no longer on meds   Hypothyroidism    Intestinal ulcer    Iron deficiency anemia due to chronic blood loss 02/27/2017   Other pancytopenia ([place]) 02/25/2017   Overactive bladder    Prostate cancer  ([place])    Sleep apnea    improved since gastric bypass   Thyroid disease      Past Surgical History:  Procedure Laterality Date   BILATERAL TOTAL SHOULDER ARTHROPLASTY  1990's   CIRCUMCISION REVISION N/A 03/13/2017   Procedure: CIRCUMCISION REVISION;  Surgeon: Nickie Retort, MD;  Location: [facility];  Service: Urology;  Laterality: N/A;   COLONOSCOPY WITH PROPOFOL N/A 10/30/2016   Procedure: COLONOSCOPY WITH PROPOFOL;  Surgeon: Lucilla Lame, MD;  Location: [facility];  Service: Endoscopy;  Laterality: N/A;   ESOPHAGOGASTRODUODENOSCOPY (EGD) WITH PROPOFOL N/A 10/30/2016   Procedure: ESOPHAGOGASTRODUODENOSCOPY (EGD) WITH PROPOFOL;  Surgeon: Lucilla Lame, MD;  Location: [facility];  Service: Endoscopy;  Laterality: N/A;   ESOPHAGOGASTRODUODENOSCOPY (EGD) WITH PROPOFOL N/A 03/26/2017   Procedure: ESOPHAGOGASTRODUODENOSCOPY (EGD) WITH PROPOFOL;  Surgeon: Lucilla Lame, MD;  Location: [place];  Service: Gastroenterology;  Laterality: N/A;  requests early   GASTRIC BYPASS  2016   Dr. Darnell Level- [facility], [place]   KNEE ARTHROSCOPY  1990s   KNEE ARTHROSCOPY W/ MENISCAL REPAIR Bilateral    KNEE ARTHROSCOPY WITH MEDIAL MENISECTOMY Left 04/21/2019   Procedure: KNEE ARTHROSCOPY WITH MEDIAL MENISECTOMY;  Surgeon: Lovell Sheehan, MD;  Location: [place];  Service: Orthopedics;  Laterality: Left;   PORTA CATH INSERTION N/A 11/23/2019   Procedure: PORTA CATH INSERTION;  Surgeon: Algernon Huxley, MD;  Location: [place] [facility];  Service: Cardiovascular;  Laterality: N/A;   TONSILLECTOMY  2004    Social History   Socioeconomic History   Marital status: Married    Spouse name: Not on file   Number of children: Not on file   Years of education: Not on file   Highest education level: Not on file  Occupational History   Not on file  Tobacco Use   Smoking status: Never   Smokeless tobacco: Current    Types: Chew  Vaping Use   Vaping Use: Never used  Substance and Sexual Activity    Alcohol use: No    Comment: Heavy ETOH in past- Last Drink 2011 per patient   Drug use: No   Sexual activity: Not Currently  Other Topics Concern   Not on file  Social History Narrative   Not on file   Social Determinants of Health   Financial Resource Strain: Not on file  Food Insecurity: Not on file  Transportation Needs: Not on file  Physical Activity: Not on file  Stress: Not on file  Social Connections: Not on file  Intimate Partner Violence: Not on file    Family History  Problem Relation Age of Onset   Stroke Mother    Breast cancer Mother    Heart disease Father    Stroke Father    AAA (abdominal aortic aneurysm) Father    Lung cancer Paternal Grandmother    Prostate cancer Brother    Bladder Cancer Neg Hx    Kidney cancer Neg Hx      Current Outpatient Medications:    Lactulose 20 GM/30ML SOLN, Take 15-30 mLs (10-20 g total) by mouth daily as needed (constipation).,  Disp: 450 mL, Rfl: 2   levothyroxine (SYNTHROID) 200 MCG tablet, Take 200 mcg by mouth daily., Disp: , Rfl:    pantoprazole (PROTONIX) 40 MG tablet, Take 1 tablet (40 mg total) by mouth daily., Disp: 30 tablet, Rfl: 2   solifenacin (VESICARE) 10 MG tablet, Take 1 tablet (10 mg total) by mouth daily., Disp: 30 tablet, Rfl: 1   traZODone (DESYREL) 150 MG tablet, Take 4 tablets (600 mg total) by mouth at bedtime., Disp: 360 tablet, Rfl: 1   oxyCODONE (OXY IR/ROXICODONE) 5 MG immediate release tablet, Take 1 tablet (5 mg total) by mouth every 4 (four) hours as needed for severe pain., Disp: 60 tablet, Rfl: 0 No current facility-administered medications for this visit.  Facility-Administered Medications Ordered in Other Visits:    sodium chloride flush (NS) 0.9 % injection 10 mL, 10 mL, Intravenous, PRN, Sindy Guadeloupe, MD, 10 mL at 09/11/20 1138  Physical exam:  Vitals:   07/23/21 1432  BP: 108/75  Pulse: 73  Resp: 16  Temp: 98.2 F (36.8 C)  TempSrc: Oral  Weight: 228 lb (103.4 kg)  Height:  6' (1.829 m)   Physical Exam Constitutional:      General: He is not in acute distress. Cardiovascular:     Rate and Rhythm: Normal rate and regular rhythm.     Heart sounds: Normal heart sounds.  Pulmonary:     Effort: Pulmonary effort is normal.     Breath sounds: Normal breath sounds.  Abdominal:     General: Bowel sounds are normal.     Palpations: Abdomen is soft.  Skin:    General: Skin is warm and dry.  Neurological:     Mental Status: He is alert and oriented to person, place, and time.     CMP Latest Ref Rng & Units 07/23/2021  Glucose 70 - 99 mg/dL 107(H)  BUN 8 - 23 mg/dL 11  Creatinine 0.61 - 1.24 mg/dL 1.61(H)  Sodium 135 - 145 mmol/L 137  Potassium 3.5 - 5.1 mmol/L 3.7  Chloride 98 - 111 mmol/L 102  CO2 22 - 32 mmol/L 26  Calcium 8.9 - 10.3 mg/dL 8.8(L)  Total Protein 6.5 - 8.1 g/dL 6.2(L)  Total Bilirubin 0.3 - 1.2 mg/dL 0.9  Alkaline Phos 38 - 126 U/L 63  AST 15 - 41 U/L 19  ALT 0 - 44 U/L 9   CBC Latest Ref Rng & Units 07/23/2021  WBC 4.0 - 10.5 K/uL 3.6(L)  Hemoglobin 13.0 - 17.0 g/dL 13.5  Hematocrit 39.0 - 52.0 % 38.8(L)  Platelets 150 - 400 K/uL 118(L)   Assessment and plan- Patient is a 71 y.o. male with castrate resistant metastatic prostate cancer s/p 6 cycles of docetaxel chemotherapyHere for routine follow-up  Patient has been having ongoing lower abdominal pain and penile pain the etiology of which is unclear.  He will be getting a PSMA PET scan on 07/31/2021 and I will see him following that to see if there is anything based on his prostate cancer that can explain his pain.  For now he is on as needed oxycodone which he will continue.  PSA is gradually trending up From 0.28 4 months ago to 3.12 presently which is concerning  Patient will be due for his next Lupron in 3 months.  He did have a bone density scan in November 2022 which did show osteopenia with a T score of -1.6 at its left femur.  I will therefore plan to give him yearly Reclast  however if patient develops castrate resistant disease he will be receiving monthly Zometa.   Visit Diagnosis 1. Prostate cancer metastatic to bone [facility])      Dr. Randa Evens, MD, MPH [facility] at [facility] [phone] 07/25/2021 2:23 PM

## 2021-07-31 ENCOUNTER — Ambulatory Visit
Admission: RE | Admit: 2021-07-31 | Discharge: 2021-07-31 | Disposition: A | Discharge status: Home / Self Care | Payer: PPO | Source: Ambulatory Visit | Attending: Oncology | Admitting: Oncology

## 2021-07-31 DIAGNOSIS — K4090 Unilateral inguinal hernia, without obstruction or gangrene, not specified as recurrent: Secondary | ICD-10-CM | POA: Diagnosis not present

## 2021-07-31 DIAGNOSIS — C61 Malignant neoplasm of prostate: Secondary | ICD-10-CM | POA: Diagnosis not present

## 2021-07-31 DIAGNOSIS — C7951 Secondary malignant neoplasm of bone: Secondary | ICD-10-CM | POA: Diagnosis not present

## 2021-07-31 DIAGNOSIS — G35 Multiple sclerosis: Secondary | ICD-10-CM | POA: Diagnosis not present

## 2021-07-31 MED ORDER — PIFLIFOLASTAT F 18 (PYLARIFY) INJECTION
9.0000 | Freq: Once | INTRAVENOUS | Status: AC
  Administered 2021-07-31: 12:00:00 9.49 via INTRAVENOUS

## 2021-08-01 ENCOUNTER — Other Ambulatory Visit: Payer: Self-pay | Admitting: Hospice and Palliative Medicine

## 2021-08-02 ENCOUNTER — Encounter: Payer: Self-pay | Admitting: Oncology

## 2021-08-02 ENCOUNTER — Other Ambulatory Visit: Payer: Self-pay | Admitting: *Deleted

## 2021-08-02 ENCOUNTER — Telehealth: Payer: Self-pay | Admitting: Hospice and Palliative Medicine

## 2021-08-02 MED ORDER — OXYCODONE HCL 5 MG PO TABS: 5.0000 mg | ORAL_TABLET | ORAL | 0 refills | Status: DC | PRN

## 2021-08-02 NOTE — Telephone Encounter (Signed)
I spoke with patient and wife.  We reviewed results of PET scan.  Findings concerning for inguinal hernia, which may be the cause of his penile pain.  Patient has been referred to general surgery.  He will see Dr. Dahlia Byes and Dr. Janese Banks on Monday.

## 2021-08-05 ENCOUNTER — Telehealth: Payer: Self-pay

## 2021-08-05 ENCOUNTER — Encounter: Payer: Self-pay | Admitting: Oncology

## 2021-08-05 ENCOUNTER — Encounter: Payer: Self-pay | Admitting: Surgery

## 2021-08-05 ENCOUNTER — Inpatient Hospital Stay (HOSPITAL_BASED_OUTPATIENT_CLINIC_OR_DEPARTMENT_OTHER): Payer: PPO | Admitting: Oncology

## 2021-08-05 ENCOUNTER — Telehealth: Payer: Self-pay | Admitting: Surgery

## 2021-08-05 ENCOUNTER — Telehealth: Payer: Self-pay | Admitting: Pharmacy Technician

## 2021-08-05 ENCOUNTER — Other Ambulatory Visit: Payer: Self-pay

## 2021-08-05 ENCOUNTER — Ambulatory Visit: Payer: PPO | Admitting: Surgery

## 2021-08-05 ENCOUNTER — Telehealth: Payer: Self-pay | Admitting: Pharmacist

## 2021-08-05 ENCOUNTER — Other Ambulatory Visit (HOSPITAL_COMMUNITY): Payer: Self-pay

## 2021-08-05 VITALS — BP 150/89 | HR 93 | Temp 98.1°F | Ht 71.0 in | Wt 219.8 lb

## 2021-08-05 VITALS — BP 135/73 | HR 78 | Temp 97.3°F | Resp 16 | Ht 71.0 in | Wt 219.0 lb

## 2021-08-05 DIAGNOSIS — Z7189 Other specified counseling: Secondary | ICD-10-CM

## 2021-08-05 DIAGNOSIS — K4090 Unilateral inguinal hernia, without obstruction or gangrene, not specified as recurrent: Secondary | ICD-10-CM

## 2021-08-05 DIAGNOSIS — C7951 Secondary malignant neoplasm of bone: Secondary | ICD-10-CM | POA: Diagnosis not present

## 2021-08-05 DIAGNOSIS — C61 Malignant neoplasm of prostate: Secondary | ICD-10-CM

## 2021-08-05 DIAGNOSIS — Z79899 Other long term (current) drug therapy: Secondary | ICD-10-CM | POA: Diagnosis not present

## 2021-08-05 MED ORDER — PREDNISONE 5 MG PO TABS
5.0000 mg | ORAL_TABLET | Freq: Two times a day (BID) | ORAL | 1 refills | Status: DC
  Filled 2021-08-05: qty 60, 30d supply, fill #0
  Filled 2021-08-28: qty 60, 30d supply, fill #1

## 2021-08-05 MED ORDER — ABIRATERONE ACETATE 250 MG PO TABS
1000.0000 mg | ORAL_TABLET | Freq: Every day | ORAL | 0 refills | Status: DC
  Filled 2021-08-05 – 2021-08-07 (×2): qty 120, 30d supply, fill #0

## 2021-08-05 NOTE — Telephone Encounter (Signed)
Oncology Clearance faxed to Dr.Rao at this time. Patient/Dr.Pabon preferred not to get medical Clearance.

## 2021-08-05 NOTE — Telephone Encounter (Signed)
Oral Oncology Patient Advocate Encounter  Prior Authorization for Dylan Ho has been approved.    PA# [id number] Effective dates: 08/05/21 through 08/05/22  Patients co-pay is $123.81  Oral Oncology Clinic will continue to follow.   [place] Patient [place] Phone [phone] Fax [phone] 08/05/2021 4:06 PM

## 2021-08-05 NOTE — Telephone Encounter (Signed)
Oral Oncology Patient Advocate Encounter   Was successful in securing patient a $3500 grant from [employer] [place] (PAF) to provide copayment coverage for Zytiga. This will keep the out of pocket expense at $0.    The billing information is as follows and has been shared with [place].   RxBin: [id number] PCN:  [id number] Member ID: [id number] Group ID: [id number] Dates of Eligibility: 02/06/21 through 08/06/22  [place] Patient [place] Phone [phone] Fax [phone] 08/05/2021 4:08 PM

## 2021-08-05 NOTE — Telephone Encounter (Signed)
Oral Oncology Patient Advocate Encounter   Received notification from [place] (HTA) that prior authorization for Fabio Asa is required.   PA submitted on CoverMyMeds Key [id number] Status is pending   Oral Oncology Clinic will continue to follow.  [place] Patient Dylan Ho Phone [phone] Fax [phone] 08/05/2021 4:05 PM

## 2021-08-05 NOTE — Patient Instructions (Addendum)
Our surgery scheduler will call you within 24-48 hours to schedule your surgery. Please have the [facility] surgery sheet available when speaking with her. Oncology Clearance faxed to Dr.Rao today.  Inguinal Hernia, Adult An inguinal hernia develops when fat or the intestines push through a weak spot in a muscle where the leg meets the lower abdomen (groin). This creates a bulge. This kind of hernia could also be: In the scrotum, if you are male. In folds of skin around the vagina, if you are male. There are three types of inguinal hernias: Hernias that can be pushed back into the abdomen (are reducible). This type rarely causes pain. Hernias that are not reducible (are incarcerated). Hernias that are not reducible and lose their blood supply (are strangulated). This type of hernia requires emergency surgery. What are the causes? This condition is caused by having a weak spot in the muscles or tissues in your groin. This develops over time. The hernia may poke through the weak spot when you suddenly strain your lower abdominal muscles, such as when you: Lift a heavy object. Strain to have a bowel movement. Constipation can lead to straining. Cough. What increases the risk? This condition is more likely to develop in: Males. Pregnant females. People who: Are overweight. Work in jobs that require long periods of standing or heavy lifting. Have had an inguinal hernia before. Smoke or have lung disease. These factors can lead to long-term (chronic) coughing. What are the signs or symptoms? Symptoms may depend on the size of the hernia. Often, a small inguinal hernia has no symptoms. Symptoms of a larger hernia may include: A bulge in the groin area. This is easier to see when standing. It might not be visible when lying down. Pain or burning in the groin. This may get worse when lifting, straining, or coughing. A dull ache or a feeling of pressure in the groin. An unusual bulge in the scrotum,  in males. Symptoms of a strangulated inguinal hernia may include: A bulge in your groin that is very painful and tender to the touch. A bulge that turns red or purple. Fever, nausea, and vomiting. Inability to have a bowel movement or to pass gas. How is this diagnosed? This condition is diagnosed based on your symptoms, your medical history, and a physical exam. Your health care provider may feel your groin area and ask you to cough. How is this treated? Treatment depends on the size of your hernia and whether you have symptoms. If you do not have symptoms, your health care provider may have you watch your hernia carefully and have you come in for follow-up visits. If your hernia is large or if you have symptoms, you may need surgery to repair the hernia. Follow these instructions at home: Lifestyle Avoid lifting heavy objects. Avoid standing for long periods of time. Do not use any products that contain nicotine or tobacco. These products include cigarettes, chewing tobacco, and vaping devices, such as e-cigarettes. If you need help quitting, ask your health care provider. Maintain a healthy weight. Preventing constipation You may need to take these actions to prevent or treat constipation: Drink enough fluid to keep your urine pale yellow. Take over-the-counter or prescription medicines. Eat foods that are high in fiber, such as beans, whole grains, and fresh fruits and vegetables. Limit foods that are high in fat and processed sugars, such as fried or sweet foods. General instructions You may try to push the hernia back in place by very gently pressing  on it while lying down. Do not try to force the bulge back in if it will not push in easily. Watch your hernia for any changes in shape, size, or color. Get help right away if you notice any changes. Take over-the-counter and prescription medicines only as told by your health care provider. Keep all follow-up visits. This is  important. Contact a health care provider if: You have a fever or chills. You develop new symptoms. Your symptoms get worse. Get help right away if: You have pain in your groin that suddenly gets worse. You have a bulge in your groin that: Suddenly gets bigger and does not get smaller. Becomes red or purple or painful to the touch. You are a man and you have a sudden pain in your scrotum, or the size of your scrotum suddenly changes. You cannot push the hernia back in place by very gently pressing on it when you are lying down. You have nausea or vomiting that does not go away. You have a fast heartbeat. You cannot have a bowel movement or pass gas. These symptoms may represent a serious problem that is an emergency. Do not wait to see if the symptoms will go away. Get medical help right away. Call your local emergency services (911 in the [place] Summary An inguinal hernia develops when fat or the intestines push through a weak spot in a muscle where your leg meets your lower abdomen (groin). This condition is caused by having a weak spot in muscles or tissues in your groin. Symptoms may depend on the size of the hernia, and they may include pain or swelling in your groin. A small inguinal hernia often has no symptoms. Treatment may not be needed if you do not have symptoms. If you have symptoms or a large hernia, you may need surgery to repair the hernia. Avoid lifting heavy objects. Also, avoid standing for long periods of time. This information is not intended to replace advice given to you by your health care provider. Make sure you discuss any questions you have with your health care provider. Document Revised: 02/21/2020 Document Reviewed: 02/21/2020 Elsevier Patient Education  2022 [employer].

## 2021-08-05 NOTE — H&P (View-Only) (Signed)
Patient ID: Dylan Ho, male   DOB: March 15, 1951, 71 y.o.   MRN: [id number]  HPI Dylan Ho is a 71 y.o. male seen in consultation at the request of Dr. Janese Banks for a right inguinal hernia.  He does have a complex history including metastatic prostate cancer and has chronic urinary symptoms both obstructive and irritation.  He also complains of intermittent inguinal pain bilaterally that goes and radiates to the scrotum and thighs.  Pain is daily moderate intensity and sharp in nature.  No specific alleviating or aggravating factors.  No prior inguinal hernia operations. He does have a prior history significant for laparoscopic gastric bypass several years ago no issues related to the surgery. He did have a recent CT scan that have personally reviewed showing evidence of moderate to large right inguinal hernia.  There was no evidence of bladder within the hernia sac  HPI  Past Medical History:  Diagnosis Date   Anemia    Arthritis    knees, shoulders   CAD (coronary artery disease)    No Stents Present- per patient   Cancer ([place])    Cirrhosis ([place])    mild   Colon polyps    Essential hypertension 08/24/2008   GERD (gastroesophageal reflux disease)    patient denies   History of alcohol abuse Last Drink- 2011   History of gout    Hypertension    no longer on meds   Hypothyroidism    Intestinal ulcer    Iron deficiency anemia due to chronic blood loss 02/27/2017   Other pancytopenia ([place]) 02/25/2017   Overactive bladder    Prostate cancer ([place])    Sleep apnea    improved since gastric bypass   Thyroid disease     Past Surgical History:  Procedure Laterality Date   BILATERAL TOTAL SHOULDER ARTHROPLASTY  1990's   CIRCUMCISION REVISION N/A 03/13/2017   Procedure: CIRCUMCISION REVISION;  Surgeon: Nickie Retort, MD;  Location: [facility];  Service: Urology;  Laterality: N/A;   COLONOSCOPY WITH PROPOFOL N/A 10/30/2016   Procedure: COLONOSCOPY WITH PROPOFOL;  Surgeon: Lucilla Lame, MD;  Location: [facility];  Service: Endoscopy;  Laterality: N/A;   ESOPHAGOGASTRODUODENOSCOPY (EGD) WITH PROPOFOL N/A 10/30/2016   Procedure: ESOPHAGOGASTRODUODENOSCOPY (EGD) WITH PROPOFOL;  Surgeon: Lucilla Lame, MD;  Location: [facility];  Service: Endoscopy;  Laterality: N/A;   ESOPHAGOGASTRODUODENOSCOPY (EGD) WITH PROPOFOL N/A 03/26/2017   Procedure: ESOPHAGOGASTRODUODENOSCOPY (EGD) WITH PROPOFOL;  Surgeon: Lucilla Lame, MD;  Location: [facility];  Service: Gastroenterology;  Laterality: N/A;  requests early   GASTRIC BYPASS  2016   Dr. Darnell Level- [facility] [place]   KNEE ARTHROSCOPY  1990s   KNEE ARTHROSCOPY W/ MENISCAL REPAIR Bilateral    KNEE ARTHROSCOPY WITH MEDIAL MENISECTOMY Left 04/21/2019   Procedure: KNEE ARTHROSCOPY WITH MEDIAL MENISECTOMY;  Surgeon: Lovell Sheehan, MD;  Location: [facility];  Service: Orthopedics;  Laterality: Left;   PORTA CATH INSERTION N/A 11/23/2019   Procedure: PORTA CATH INSERTION;  Surgeon: Algernon Huxley, MD;  Location: [facility];  Service: Cardiovascular;  Laterality: N/A;   TONSILLECTOMY  2004    Family History  Problem Relation Age of Onset   Stroke Mother    Breast cancer Mother    Heart disease Father    Stroke Father    AAA (abdominal aortic aneurysm) Father    Lung cancer Paternal Grandmother    Prostate cancer Brother    Bladder Cancer Neg Hx    Kidney cancer Neg  Hx     Social History Social History   Tobacco Use   Smoking status: Never   Smokeless tobacco: Current    Types: Chew  Vaping Use   Vaping Use: Never used  Substance Use Topics   Alcohol use: No    Comment: Heavy ETOH in past- Last Drink 2011 per patient   Drug use: No    No Known Allergies  Current Outpatient Medications  Medication Sig Dispense Refill   Lactulose 20 GM/30ML SOLN Take 15-30 mLs (10-20 g total) by mouth daily as needed (constipation). (Patient taking differently: Take 30 mLs by mouth daily.) 450 mL 2   levothyroxine  (SYNTHROID) 200 MCG tablet Take 200 mcg by mouth daily.     oxyCODONE (OXY IR/ROXICODONE) 5 MG immediate release tablet Take 1 tablet (5 mg total) by mouth every 4 (four) hours as needed for severe pain. 60 tablet 0   pantoprazole (PROTONIX) 40 MG tablet Take 1 tablet (40 mg total) by mouth daily. (Patient taking differently: Take 40 mg by mouth daily as needed (acid reflux).) 30 tablet 2   solifenacin (VESICARE) 10 MG tablet Take 1 tablet (10 mg total) by mouth daily. 30 tablet 1   traZODone (DESYREL) 150 MG tablet Take 4 tablets (600 mg total) by mouth at bedtime. (Patient taking differently: Take 450 mg by mouth at bedtime.) 360 tablet 1   aspirin-acetaminophen-caffeine (EXCEDRIN MIGRAINE) 250-250-65 MG tablet Take 2 tablets by mouth every 6 (six) hours as needed for headache.     No current facility-administered medications for this visit.   Facility-Administered Medications Ordered in Other Visits  Medication Dose Route Frequency Provider Last Rate Last Admin   sodium chloride flush (NS) 0.9 % injection 10 mL  10 mL Intravenous PRN Sindy Guadeloupe, MD   10 mL at 09/11/20 1138     Review of Systems Full ROS  was asked and was negative except for the information on the HPI  Physical Exam Blood pressure (!) 150/89, pulse 93, temperature 98.1 F (36.7 C), temperature source Oral, height 5\' 11"  (1.803 m), weight 219 lb 12.8 oz (99.7 kg), SpO2 95 %. CONSTITUTIONAL: NAD. EYES: Pupils are equal, round, and reactive to light, Sclera are non-icteric. EARS, NOSE, MOUTH AND THROAT: The oropharynx is clear. The oral mucosa is pink and moist. Hearing is intact to voice. LYMPH NODES:  Lymph nodes in the neck are normal. RESPIRATORY:  Lungs are clear. There is normal respiratory effort, with equal breath sounds bilaterally, and without pathologic use of accessory muscles. CARDIOVASCULAR: Heart is regular without murmurs, gallops, or rubs. GI: The abdomen is  soft, nontender, and nondistended. There  are no palpable masses. There is no hepatosplenomegaly. There are normal bowel sounds . There is a reducible Right inguinal hernia. GU: Rectal deferred.   MUSCULOSKELETAL: Normal muscle strength and tone. No cyanosis or edema.   SKIN: Turgor is good and there are no pathologic skin lesions or ulcers. NEUROLOGIC: Motor and sensation is grossly normal. Cranial nerves are grossly intact. PSYCH:  Oriented to person, place and time. Affect is normal.  Data Reviewed  I have personally reviewed the patient's imaging, laboratory findings and medical records.    Assessment/Plan 71 year old male with history of metastatic prostate cancer with inguinal and lower pelvic pain.  He does have scrotal and thigh pain and some significant urinary symptoms.  I had an extensive discussion with the patient and the family.  He does have a right inguinal hernia but at this time is  very difficult to say which changes are attributed to the hernia, which changes are attributed to his prostate and the metastatic disease. He definitely has a right inguinal hernia evident by physical exam as well as CT. After an extensive discussion with patient they decided to move forward with surgical intervention.  Specifically did not give him any guarantees that fixing the hernia will improve his pain.  At this point in time since nothing has worked he wants to keep surgery as sharp. Procedure discussed with the patient in detail.  Risks, benefits and possible complications including but not limited to, bleeding, infection, recurrence, chronic pain, mesh issues.  He is in agreement if we were to find bilateral defect he wants me to go ahead and fix them both.  Please note that I spent greater than 65 minutes's in this  encounter including personally reviewing imaging studies, coordination of care, counseling the patient, placing orders and performing appropriate documentation  Caroleen Hamman, MD FACS General Surgeon 08/05/2021, 1:55 PM

## 2021-08-05 NOTE — Progress Notes (Signed)
Patient ID: Dylan Ho, male   DOB: October 15, 1950, 71 y.o.   MRN: [id number]  HPI Dylan Ho is a 71 y.o. male seen in consultation at the request of Dr. Janese Banks for a right inguinal hernia.  He does have a complex history including metastatic prostate cancer and has chronic urinary symptoms both obstructive and irritation.  He also complains of intermittent inguinal pain bilaterally that goes and radiates to the scrotum and thighs.  Pain is daily moderate intensity and sharp in nature.  No specific alleviating or aggravating factors.  No prior inguinal hernia operations. He does have a prior history significant for laparoscopic gastric bypass several years ago no issues related to the surgery. He did have a recent CT scan that have personally reviewed showing evidence of moderate to large right inguinal hernia.  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   Colon polyps    Essential hypertension 08/24/2008   GERD (gastroesophageal reflux disease)    patient denies   History of alcohol abuse Last Drink- 2011   History of gout    Hypertension    no longer on meds   Hypothyroidism    Intestinal ulcer    Iron deficiency anemia due to chronic blood loss 02/27/2017   Other pancytopenia ([place]) 02/25/2017   Overactive bladder    Prostate cancer ([place])    Sleep apnea    improved since gastric bypass   Thyroid disease     Past Surgical History:  Procedure Laterality Date   BILATERAL TOTAL SHOULDER ARTHROPLASTY  1990's   CIRCUMCISION REVISION N/A 03/13/2017   Procedure: CIRCUMCISION REVISION;  Surgeon: Nickie Retort, MD;  Location: [facility];  Service: Urology;  Laterality: N/A;   COLONOSCOPY WITH PROPOFOL N/A 10/30/2016   Procedure: COLONOSCOPY WITH PROPOFOL;  Surgeon: Lucilla Lame, MD;  Location: [facility];  Service: Endoscopy;  Laterality: N/A;   ESOPHAGOGASTRODUODENOSCOPY (EGD) WITH PROPOFOL N/A 10/30/2016   Procedure: ESOPHAGOGASTRODUODENOSCOPY (EGD) WITH PROPOFOL;  Surgeon: Lucilla Lame, MD;  Location: [facility];  Service: Endoscopy;  Laterality: N/A;   ESOPHAGOGASTRODUODENOSCOPY (EGD) WITH PROPOFOL N/A 03/26/2017   Procedure: ESOPHAGOGASTRODUODENOSCOPY (EGD) WITH PROPOFOL;  Surgeon: Lucilla Lame, MD;  Location: [facility];  Service: Gastroenterology;  Laterality: N/A;  requests early   GASTRIC BYPASS  2016   Dr. Darnell Level- [facility], [place]   KNEE ARTHROSCOPY  1990s   KNEE ARTHROSCOPY W/ MENISCAL REPAIR Bilateral    KNEE ARTHROSCOPY WITH MEDIAL MENISECTOMY Left 04/21/2019   Procedure: KNEE ARTHROSCOPY WITH MEDIAL MENISECTOMY;  Surgeon: Lovell Sheehan, MD;  Location: [place];  Service: Orthopedics;  Laterality: Left;   PORTA CATH INSERTION N/A 11/23/2019   Procedure: PORTA CATH INSERTION;  Surgeon: Algernon Huxley, MD;  Location: [place] [facility];  Service: Cardiovascular;  Laterality: N/A;   TONSILLECTOMY  2004    Family History  Problem Relation Age of Onset   Stroke Mother    Breast cancer Mother    Heart disease Father    Stroke Father    AAA (abdominal aortic aneurysm) Father    Lung cancer Paternal Grandmother    Prostate cancer Brother    Bladder Cancer Neg Hx    Kidney cancer Neg  Hx     Social History Social History   Tobacco Use   Smoking status: Never   Smokeless tobacco: Current    Types: Chew  Vaping Use   Vaping Use: Never used  Substance Use Topics   Alcohol use: No    Comment: Heavy ETOH in past- Last Drink 2011 per patient   Drug use: No    No Known Allergies  Current Outpatient Medications  Medication Sig Dispense Refill   Lactulose 20 GM/30ML SOLN Take 15-30 mLs (10-20 g total) by mouth daily as needed (constipation). (Patient taking differently: Take 30 mLs by mouth daily.) 450 mL 2   levothyroxine  (SYNTHROID) 200 MCG tablet Take 200 mcg by mouth daily.     oxyCODONE (OXY IR/ROXICODONE) 5 MG immediate release tablet Take 1 tablet (5 mg total) by mouth every 4 (four) hours as needed for severe pain. 60 tablet 0   pantoprazole (PROTONIX) 40 MG tablet Take 1 tablet (40 mg total) by mouth daily. (Patient taking differently: Take 40 mg by mouth daily as needed (acid reflux).) 30 tablet 2   solifenacin (VESICARE) 10 MG tablet Take 1 tablet (10 mg total) by mouth daily. 30 tablet 1   traZODone (DESYREL) 150 MG tablet Take 4 tablets (600 mg total) by mouth at bedtime. (Patient taking differently: Take 450 mg by mouth at bedtime.) 360 tablet 1   aspirin-acetaminophen-caffeine (EXCEDRIN MIGRAINE) 250-250-65 MG tablet Take 2 tablets by mouth every 6 (six) hours as needed for headache.     No current facility-administered medications for this visit.   Facility-Administered Medications Ordered in Other Visits  Medication Dose Route Frequency Provider Last Rate Last Admin   sodium chloride flush (NS) 0.9 % injection 10 mL  10 mL Intravenous PRN Sindy Guadeloupe, MD   10 mL at 09/11/20 1138     Review of Systems Full ROS  was asked and was negative except for the information on the HPI  Physical Exam Blood pressure (!) 150/89, pulse 93, temperature 98.1 F (36.7 C), temperature source Oral, height 5\' 11"  (1.803 m), weight 219 lb 12.8 oz (99.7 kg), SpO2 95 %. CONSTITUTIONAL: NAD. EYES: Pupils are equal, round, and reactive to light, Sclera are non-icteric. EARS, NOSE, MOUTH AND THROAT: The oropharynx is clear. The oral mucosa is pink and moist. Hearing is intact to voice. LYMPH NODES:  Lymph nodes in the neck are normal. RESPIRATORY:  Lungs are clear. There is normal respiratory effort, with equal breath sounds bilaterally, and without pathologic use of accessory muscles. CARDIOVASCULAR: Heart is regular without murmurs, gallops, or rubs. GI: The abdomen is  soft, nontender, and nondistended. There  are no palpable masses. There is no hepatosplenomegaly. There are normal bowel sounds . There is a reducible Right inguinal hernia. GU: Rectal deferred.   MUSCULOSKELETAL: Normal muscle strength and tone. No cyanosis or edema.   SKIN: Turgor is good and there are no pathologic skin lesions or ulcers. NEUROLOGIC: Motor and sensation is grossly normal. Cranial nerves are grossly intact. PSYCH:  Oriented to person, place and time. Affect is normal.  Data Reviewed  I have personally reviewed the patient's imaging, laboratory findings and medical records.    Assessment/Plan 71 year old male with history of metastatic prostate cancer with inguinal and lower pelvic pain.  He does have scrotal and thigh pain and some significant urinary symptoms.  I had an extensive discussion with the patient and the family.  He does have a right inguinal hernia but at this time is  very difficult to say which changes are attributed to the hernia, which changes are attributed to his prostate and the metastatic disease. He definitely has a right inguinal hernia evident by physical exam as well as CT. After an extensive discussion with patient they decided to move forward with surgical intervention.  Specifically did not give him any guarantees that fixing the hernia will improve his pain.  At this point in time since nothing has worked he wants to keep surgery as sharp. Procedure discussed with the patient in detail.  Risks, benefits and possible complications including but not limited to, bleeding, infection, recurrence, chronic pain, mesh issues.  He is in agreement if we were to find bilateral defect he wants me to go ahead and fix them both.  Please note that I spent greater than 65 minutes's in this  encounter including personally reviewing imaging studies, coordination of care, counseling the patient, placing orders and performing appropriate documentation  Caroleen Hamman, MD FACS General Surgeon 08/05/2021, 1:55 PM

## 2021-08-05 NOTE — Telephone Encounter (Signed)
Oral Oncology Pharmacist Encounter  Received new prescription for Zytiga (abiraterone) for the treatment of metastatic castration-resistant prostate cancer (CRPC) in conjunction with prednisone 5 mg BID and ADT. Planned duration until disease progression or unacceptable toxicity.  Labs from 07/23/2021 (AST/ALT, K, BP 135/73, ANC) assessed, no relevant concerns. Would recommend home blood pressure monitoring. Prescription dose and frequency assessed.   Current medication list in Epic reviewed, no DDIs.  Evaluated chart and no patient barriers to medication adherence identified.   Prescription has been e-scribed to the [facility] for benefits analysis and approval.  Oral Oncology Clinic will continue to follow for insurance authorization, copayment issues, initial counseling and start date.  Patient agreed to treatment on 08/05/2021 per MD documentation.   Wynelle Cleveland, PharmD Pharmacy Resident  [place] [phone]  08/05/2021 3:42 PM

## 2021-08-05 NOTE — Telephone Encounter (Signed)
Patient has been advised of Pre-Admission date/time, COVID Testing date and Surgery date.  Surgery Date: 08/13/21 Preadmission Testing Date: 08/08/21 (phone 8a-1p) Covid Testing Date: Not needed.    Patient has been made aware to call [phone], between 1-3:00pm the day before surgery, to find out what time to arrive for surgery.

## 2021-08-06 ENCOUNTER — Encounter: Payer: Self-pay | Admitting: Oncology

## 2021-08-06 ENCOUNTER — Other Ambulatory Visit (HOSPITAL_COMMUNITY): Payer: Self-pay

## 2021-08-07 ENCOUNTER — Other Ambulatory Visit (HOSPITAL_COMMUNITY): Payer: Self-pay

## 2021-08-07 NOTE — Progress Notes (Addendum)
Oncology clearance received from Dr Janese Banks. The patient is cleared at Low risk from an oncology standpoint.

## 2021-08-08 ENCOUNTER — Other Ambulatory Visit: Payer: Self-pay

## 2021-08-08 ENCOUNTER — Other Ambulatory Visit (HOSPITAL_COMMUNITY): Payer: Self-pay

## 2021-08-08 ENCOUNTER — Other Ambulatory Visit
Admission: RE | Admit: 2021-08-08 | Discharge: 2021-08-08 | Disposition: A | Discharge status: Home / Self Care | Payer: PPO | Source: Ambulatory Visit | Attending: Surgery | Admitting: Surgery

## 2021-08-08 NOTE — Patient Instructions (Addendum)
Your procedure is scheduled on: 08/13/21 - Tuesday Report to the Registration Desk on the 1st floor of the [facility]. To find out your arrival time, please call [phone] between 1PM - 3PM on: 08/12/21 - Monday [facility] on 08/09/21 at 2 pm.  REMEMBER: Instructions that are not followed completely may result in serious medical risk, up to and including death; or upon the discretion of your surgeon and anesthesiologist your surgery may need to be rescheduled.  Do not eat food after midnight the night before surgery.  No gum chewing, lozengers or hard candies.  You may however, drink CLEAR liquids up to 2 hours before you are scheduled to arrive for your surgery. Do not drink anything within 2 hours of your scheduled arrival time.  Clear liquids include: - water  - apple juice without pulp - gatorade (not RED, PURPLE, OR BLUE) - black coffee or tea (Do NOT add milk or creamers to the coffee or tea) Do NOT drink anything that is not on this list.  TAKE THESE MEDICATIONS THE MORNING OF SURGERY WITH A SIP OF WATER:  - levothyroxine (SYNTHROID) 200 MCG tablet - oxyCODONE (OXY IR/ROXICODONE) 5 MG immediate release tablet - pantoprazole (PROTONIX) 40 MG tablet, (take one the night before and one on the morning of surgery - helps to prevent nausea after surgery.)  One week prior to surgery: Stop Anti-inflammatories (NSAIDS) such as Advil, Aleve, Ibuprofen, Motrin, Naproxen, Naprosyn and Aspirin based products such as Excedrin, Goodys Powder, BC Powder.  Stop ANY OVER THE COUNTER supplements until after surgery.  You may take Tylenol if needed for pain up until the day of surgery.  No Alcohol for 24 hours before or after surgery.  No Smoking including e-cigarettes for 24 hours prior to surgery.  No chewable tobacco products for at least 6 hours prior to surgery.  No nicotine patches on the day of surgery.  Do not use any "recreational" drugs for at  least a week prior to your surgery.  Please be advised that the combination of cocaine and anesthesia may have negative outcomes, up to and including death. If you test positive for cocaine, your surgery will be cancelled.  On the morning of surgery brush your teeth with toothpaste and water, you may rinse your mouth with mouthwash if you wish. Do not swallow any toothpaste or mouthwash.  Use CHG Soap or wipes as directed on instruction sheet.  Do not wear jewelry, make-up, hairpins, clips or nail polish.  Do not wear lotions, powders, or perfumes.   Do not shave body from the neck down 48 hours prior to surgery just in case you cut yourself which could leave a site for infection.  Also, freshly shaved skin may become irritated if using the CHG soap.  Contact lenses, hearing aids and dentures may not be worn into surgery.  Do not bring valuables to the hospital. [facility] is not responsible for any missing/lost belongings or valuables.   Notify your doctor if there is any change in your medical condition (cold, fever, infection).  Wear comfortable clothing (specific to your surgery type) to the hospital.  After surgery, you can help prevent lung complications by doing breathing exercises.  Take deep breaths and cough every 1-2 hours. Your doctor may order a device called an Incentive Spirometer to help you take deep breaths. When coughing or sneezing, hold a pillow firmly against your incision with both hands. This is called splinting. Doing this  helps protect your incision. It also decreases belly discomfort.  If you are being admitted to the hospital overnight, leave your suitcase in the car. After surgery it may be brought to your room.  If you are being discharged the day of surgery, you will not be allowed to drive home. You will need a responsible adult (18 years or older) to drive you home and stay with you that night.   If you are taking public transportation, you will  need to have a responsible adult (18 years or older) with you. Please confirm with your physician that it is acceptable to use public transportation.   Please call the [facility] at [phone] if you have any questions about these instructions.  Surgery Visitation Policy:  Patients undergoing a surgery or procedure may have one family member or support person with them as long as that person is not COVID-19 positive or experiencing its symptoms.  That person may remain in the waiting area during the procedure and may rotate out with other people.  Inpatient Visitation:    Visiting hours are 7 a.m. to 8 p.m. Up to two visitors ages 16+ are allowed at one time in a patient room. The visitors may rotate out with other people during the day. Visitors must check out when they leave, or other visitors will not be allowed. One designated support person may remain overnight. The visitor must pass COVID-19 screenings, use hand sanitizer when entering and exiting the patients room and wear a mask at all times, including in the patients room. Patients must also wear a mask when staff or their visitor are in the room. Masking is required regardless of vaccination status.

## 2021-08-09 ENCOUNTER — Other Ambulatory Visit (HOSPITAL_COMMUNITY): Payer: Self-pay

## 2021-08-09 ENCOUNTER — Encounter: Payer: Self-pay | Admitting: Oncology

## 2021-08-09 ENCOUNTER — Other Ambulatory Visit
Admission: RE | Admit: 2021-08-09 | Discharge: 2021-08-09 | Disposition: A | Discharge status: Home / Self Care | Payer: PPO | Source: Ambulatory Visit | Attending: Surgery | Admitting: Surgery

## 2021-08-09 DIAGNOSIS — Z01810 Encounter for preprocedural cardiovascular examination: Secondary | ICD-10-CM | POA: Insufficient documentation

## 2021-08-09 DIAGNOSIS — Z01818 Encounter for other preprocedural examination: Secondary | ICD-10-CM | POA: Diagnosis not present

## 2021-08-09 NOTE — Telephone Encounter (Signed)
Patient received first shipment of Abiraterone and Prednisone today, 08/09/21.

## 2021-08-09 NOTE — Progress Notes (Signed)
Hematology/Oncology Consult note [facility]  [phone] [phone]  Patient Care Team: Kirk Ruths, MD as PCP - General (Internal Medicine) Nickie Retort, MD (Inactive) as Consulting Physician (Urology) Lucilla Lame, MD as Consulting Physician (Gastroenterology) Lovell Sheehan, MD as Consulting Physician (Orthopedic Surgery) Sindy Guadeloupe, MD as Consulting Physician (Oncology)   Name of the patient: Dylan Ho  [id number]  January 08, 1951   Date of visit: 08/09/21  Diagnosis-metastatic castrate resistant prostate cancer  Chief complaint/ Reason for visit-discuss CT scan results and further management  Heme/Onc history:  Patient is a 71 year old male with a past medical history significant for alcohol-related cirrhosis.  He has not had any ascites or labs suggestive of chronic liver disease.  He does not drink alcohol anymore.  Patient was seen by scale for ongoing urinary symptoms of frequency especially at night.  This was followed by a CT renal stone study which showed cyst in the upper pole of the right kidney as well as left kidney.  S/p gastric bypass.  Pelvic node enlargement in the right pelvis 11 mm.  Bulky rounded lymph node along the external iliac chain 17 mm.  Prostate was enlarged measuring 4.3 cm.  Right obturator lymph node 1 cm.  Sclerosis of L3.  Left hemisacrum with sclerotic lesion.  Sclerosis of the superior pubic rami.  Large area of sclerosis affecting the right hemisacrum 4.7 x 2.4 cm.  Left ninth and 11th rib with sclerotic lesions compatible with bone metastases.  Right hip sclerosis.  PSA was elevated at 18.6.   Patient underwent 12 core prostate biopsy which showed adenocarcinoma Gleason score 9(4+5) grade group 5.    Patient is currently on Lupron and finished 6 cycles of docetaxel chemotherapy In September 2021.    Interval history-patient has ongoing lower abdominal/right groin painAs well as  penile pain which has remained unchanged  ECOG PS- 1 Pain scale- 3   Review of systems- Review of Systems  Constitutional:  Positive for malaise/fatigue. Negative for chills, fever and weight loss.  HENT:  Negative for congestion, ear discharge and nosebleeds.   Eyes:  Negative for blurred vision.  Respiratory:  Negative for cough, hemoptysis, sputum production, shortness of breath and wheezing.   Cardiovascular:  Negative for chest pain, palpitations, orthopnea and claudication.  Gastrointestinal:  Positive for abdominal pain. Negative for blood in stool, constipation, diarrhea, heartburn, melena, nausea and vomiting.  Genitourinary:  Negative for dysuria, flank pain, frequency, hematuria and urgency.  Musculoskeletal:  Negative for back pain, joint pain and myalgias.  Skin:  Negative for rash.  Neurological:  Negative for dizziness, tingling, focal weakness, seizures, weakness and headaches.  Endo/Heme/Allergies:  Does not bruise/bleed easily.  Psychiatric/Behavioral:  Negative for depression and suicidal ideas. The patient does not have insomnia.      No Known Allergies   Past Medical History:  Diagnosis Date   Anemia    Arthritis    knees, shoulders   CAD (coronary artery disease)    No Stents Present- per patient   Cancer ([place])    Cirrhosis ([place])    mild   Colon polyps    Essential hypertension 08/24/2008   GERD (gastroesophageal reflux disease)    patient denies   History of alcohol abuse Last Drink- 2011   History of gout    Hypertension    no longer on meds   Hypothyroidism    Intestinal ulcer    Iron deficiency anemia due to chronic blood  loss 02/27/2017   Other pancytopenia ([place]) 02/25/2017   Overactive bladder    Prostate cancer ([place])    Sleep apnea    improved since gastric bypass   Thyroid disease      Past Surgical History:  Procedure Laterality Date   BILATERAL TOTAL SHOULDER ARTHROPLASTY  1990's   CIRCUMCISION REVISION N/A 03/13/2017    Procedure: CIRCUMCISION REVISION;  Surgeon: Nickie Retort, MD;  Location: [facility];  Service: Urology;  Laterality: N/A;   COLONOSCOPY WITH PROPOFOL N/A 10/30/2016   Procedure: COLONOSCOPY WITH PROPOFOL;  Surgeon: Lucilla Lame, MD;  Location: [facility];  Service: Endoscopy;  Laterality: N/A;   ESOPHAGOGASTRODUODENOSCOPY (EGD) WITH PROPOFOL N/A 10/30/2016   Procedure: ESOPHAGOGASTRODUODENOSCOPY (EGD) WITH PROPOFOL;  Surgeon: Lucilla Lame, MD;  Location: [facility];  Service: Endoscopy;  Laterality: N/A;   ESOPHAGOGASTRODUODENOSCOPY (EGD) WITH PROPOFOL N/A 03/26/2017   Procedure: ESOPHAGOGASTRODUODENOSCOPY (EGD) WITH PROPOFOL;  Surgeon: Lucilla Lame, MD;  Location: [place];  Service: Gastroenterology;  Laterality: N/A;  requests early   GASTRIC BYPASS  2016   Dr. Darnell Level- [facility], [place]   KNEE ARTHROSCOPY  1990s   KNEE ARTHROSCOPY W/ MENISCAL REPAIR Bilateral    KNEE ARTHROSCOPY WITH MEDIAL MENISECTOMY Left 04/21/2019   Procedure: KNEE ARTHROSCOPY WITH MEDIAL MENISECTOMY;  Surgeon: Lovell Sheehan, MD;  Location: [facility];  Service: Orthopedics;  Laterality: Left;   PORTA CATH INSERTION N/A 11/23/2019   Procedure: PORTA CATH INSERTION;  Surgeon: Algernon Huxley, MD;  Location: [facility];  Service: Cardiovascular;  Laterality: N/A;   TONSILLECTOMY  2004    Social History   Socioeconomic History   Marital status: Married    Spouse name: Not on file   Number of children: Not on file   Years of education: Not on file   Highest education level: Not on file  Occupational History   Not on file  Tobacco Use   Smoking status: Never   Smokeless tobacco: Current    Types: Chew  Vaping Use   Vaping Use: Never used  Substance and Sexual Activity   Alcohol use: No    Comment: Heavy ETOH in past- Last Drink 2011 per patient   Drug use: No   Sexual activity: Not Currently  Other Topics Concern   Not on file  Social History Narrative   Not on file   Social  Determinants of Health   Financial Resource Strain: Not on file  Food Insecurity: Not on file  Transportation Needs: Not on file  Physical Activity: Not on file  Stress: Not on file  Social Connections: Not on file  Intimate Partner Violence: Not on file    Family History  Problem Relation Age of Onset   Stroke Mother    Breast cancer Mother    Heart disease Father    Stroke Father    AAA (abdominal aortic aneurysm) Father    Lung cancer Paternal Grandmother    Prostate cancer Brother    Bladder Cancer Neg Hx    Kidney cancer Neg Hx      Current Outpatient Medications:    abiraterone acetate (ZYTIGA) 250 MG tablet, Take 4 tablets (1,000 mg total) by mouth daily. Take on an empty stomach 1 hour before or 2 hours after a meal, Disp: 120 tablet, Rfl: 0   aspirin-acetaminophen-caffeine (EXCEDRIN MIGRAINE) 250-250-65 MG tablet, Take 2 tablets by mouth every 6 (six) hours as needed for headache. (Patient not taking: Reported on 08/08/2021), Disp: , Rfl:  Lactulose 20 GM/30ML SOLN, Take 15-30 mLs (10-20 g total) by mouth daily as needed (constipation). (Patient taking differently: Take 30 mLs by mouth daily.), Disp: 450 mL, Rfl: 2   levothyroxine (SYNTHROID) 200 MCG tablet, Take 200 mcg by mouth daily., Disp: , Rfl:    oxyCODONE (OXY IR/ROXICODONE) 5 MG immediate release tablet, Take 1 tablet (5 mg total) by mouth every 4 (four) hours as needed for severe pain., Disp: 60 tablet, Rfl: 0   pantoprazole (PROTONIX) 40 MG tablet, Take 1 tablet (40 mg total) by mouth daily. (Patient taking differently: Take 40 mg by mouth daily as needed (acid reflux).), Disp: 30 tablet, Rfl: 2   predniSONE (DELTASONE) 5 MG tablet, Take 1 tablet (5 mg total) by mouth 2 (two) times daily with a meal., Disp: 60 tablet, Rfl: 1   solifenacin (VESICARE) 10 MG tablet, Take 1 tablet (10 mg total) by mouth daily., Disp: 30 tablet, Rfl: 1   traZODone (DESYREL) 150 MG tablet, Take 4 tablets (600 mg total) by mouth at  bedtime. (Patient taking differently: Take 450 mg by mouth at bedtime.), Disp: 360 tablet, Rfl: 1 No current facility-administered medications for this visit.  Facility-Administered Medications Ordered in Other Visits:    sodium chloride flush (NS) 0.9 % injection 10 mL, 10 mL, Intravenous, PRN, Sindy Guadeloupe, MD, 10 mL at 09/11/20 1138  Physical exam:  Vitals:   08/05/21 1417  BP: 135/73  Pulse: 78  Resp: 16  Temp: (!) 97.3 F (36.3 C)  TempSrc: Tympanic  SpO2: 97%  Weight: 219 lb (99.3 kg)  Height: 5\' 11"  (1.803 m)   Physical Exam Constitutional:      General: He is not in acute distress. Cardiovascular:     Rate and Rhythm: Normal rate and regular rhythm.     Heart sounds: Normal heart sounds.  Pulmonary:     Effort: Pulmonary effort is normal.  Skin:    General: Skin is warm and dry.  Neurological:     Mental Status: He is alert and oriented to person, place, and time.     CMP Latest Ref Rng & Units 07/23/2021  Glucose 70 - 99 mg/dL 107(H)  BUN 8 - 23 mg/dL 11  Creatinine 0.61 - 1.24 mg/dL 1.61(H)  Sodium 135 - 145 mmol/L 137  Potassium 3.5 - 5.1 mmol/L 3.7  Chloride 98 - 111 mmol/L 102  CO2 22 - 32 mmol/L 26  Calcium 8.9 - 10.3 mg/dL 8.8(L)  Total Protein 6.5 - 8.1 g/dL 6.2(L)  Total Bilirubin 0.3 - 1.2 mg/dL 0.9  Alkaline Phos 38 - 126 U/L 63  AST 15 - 41 U/L 19  ALT 0 - 44 U/L 9   CBC Latest Ref Rng & Units 07/23/2021  WBC 4.0 - 10.5 K/uL 3.6(L)  Hemoglobin 13.0 - 17.0 g/dL 13.5  Hematocrit 39.0 - 52.0 % 38.8(L)  Platelets 150 - 400 K/uL 118(L)    No images are attached to the encounter.  NM PET (PSMA) SKULL TO MID THIGH  Result Date: 08/01/2021 CLINICAL DATA:  71 year old male with metastatic prostate cancer. Bone metastasis. Increasing PSA. EXAM: NUCLEAR MEDICINE PET SKULL BASE TO THIGH TECHNIQUE: 9.5 mCi F18 Piflufolastat (Pylarify) was injected intravenously. Full-ring PET imaging was performed from the skull base to thigh after the radiotracer. CT  data was obtained and used for attenuation correction and anatomic localization. COMPARISON:  None. FINDINGS: NECK No radiotracer activity in neck lymph nodes. Incidental CT finding: None CHEST No radiotracer accumulation within mediastinal or hilar  lymph nodes. No suspicious pulmonary nodules on the CT scan. Incidental CT finding: Port in the anterior chest wall with tip in distal SVC. ABDOMEN/PELVIS Prostate: No focal activity in the prostate gland. Lymph nodes: No abnormal radiotracer accumulation within pelvic or abdominal nodes. Liver: No evidence of liver metastasis Incidental CT finding: RIGHT inguinal hernia contains a 4 cm segment of nonobstructed small bowel (image 250/3). Finding new from comparison CT. SKELETON Multiple foci of intense radiotracer activity associated with a sclerotic skeletal metastasis. Example lesion on the RIGHT at the pubic symphysis with SUV max equal 26.4 (image 255). Large rounded sclerotic lesion in the L3 vertebral body measuring 2.1 cm (image 185/series 3) with SUV max equal 63.9. Several additional radiotracer avid lesions in the thoracic spine normal cervical spine. These lesions are also associated sclerosis on CT. For example lesion at T1 with SUV max equal 10.6. Many of the sclerotic lesions do not have radiotracer activity. Example lesion at L2 is sclerotic without radiotracer activity (image 173) IMPRESSION: 1. Multiple intensely radiotracer avid sclerotic lesions in the pelvis and spine consistent with active prostate cancer skeletal metastasis. Findings similar to bone scan 04/12/2021. 2. There are multiple additional sclerotic lesions without radiotracer activity consistent with treated metastasis. 3. No evidence prostate cancer nodal metastasis or visceral metastasis. 4. New finding of a loop of small bowel entering a RIGHT inguinal hernia sac. No evidence obstruction but risk for incarceration. Electronically Signed   By: Suzy Bouchard M.D.   On: 08/01/2021 12:46      Assessment and plan- Patient is a 71 y.o. male with metastatic castrate resistant prostate cancer here to discuss CT scan results and further management  I reviewed the PET scan images independently and discussed findings with the patient which shows progression of bony metastatic disease.  Also his previously noted prostate cancer with lesions in his bone are intensely hypermetabolic consistent with active prostate cancer.  Patient's PSA has been gradually trending up and presently at 3.12 as compared to 0.28 4 months prior.  Patient therefore appears to have castrate resistant disease despite using Lupron and docetaxel.  I therefore plan to proceed with Zytiga at this time while he continues to receive Lupron.  Discussed risks and benefits of Zytiga including all but not limited to fatigue, electrolyte disturbances, diarrhea and leg swelling.  We will plan to start Zytiga in about 1 week to 10 days and start working on insurance approval for the same.  Also given that he has castrate resistant disease there would be role to start Zometa at this time.  Patient is edentulous and therefore does not require any dental clearance.  We will check CBC with differential CMP in 3 weeks in 6 weeks and I will see him back in 6 weeks and he will receive his first dose of Zometa at that time.  Discussed the benefit of Zometa in reducing future skeletal fractures.  Discussed risks of Zometa including all but not limited to hypocalcemia and possible risk of osteonecrosis of the jaw.  Patient understands and agrees to proceed as planned  PET scan also showedEvidence of inguinal hernia which was nonobstructed but at risk of incarceration.  It is unclear if patient's lower abdominal/right groin pain is secondary to the hernia.  Patient also has ongoing penile pain which unclear if related.  He was seen by Dr. Adora Fridge this morning after I got the results of the PET scan. Patient will be proceeding with right inguinal  hernia surgery with mesh repair  next week.  He will start Zytiga following his surgery.   Visit Diagnosis 1. Prostate cancer metastatic to bone ([place])   2. Goals of care, counseling/discussion   3. High risk medication use      Dr. Randa Evens, MD, MPH [facility] [phone] 08/09/2021 1:24 PM

## 2021-08-11 ENCOUNTER — Other Ambulatory Visit: Payer: Self-pay | Admitting: Oncology

## 2021-08-12 ENCOUNTER — Telehealth: Payer: Self-pay | Admitting: *Deleted

## 2021-08-12 MED ORDER — OXYCODONE HCL 5 MG PO TABS: 5.0000 mg | ORAL_TABLET | ORAL | 0 refills | Status: DC | PRN

## 2021-08-12 NOTE — Telephone Encounter (Signed)
Called pt to see how much pain the pt been having. He states that when he gets up and moves it hurts all the time. If he lays down and stays still much better. He states he has been taken 3-4 tablets a day. He is having hernia repair  tom. I told him we will send a rx for pain meds for prostates cancer

## 2021-08-12 NOTE — Telephone Encounter (Signed)
Pt sent message to get refill oxycodone. He got 60 1/27 and dr Janese Banks wanted to know about how many he is taking. He said 3-4 a day. I checked with rao and he should have 3 days worth at least . Then he calls back about his refill and dr Janese Banks was going to wait a few days. He says that he has 3 pills left and so therfore sometimes he takes more. Dr. Janese Banks sent in pain med refill and increase the amount to 120. Pt is thankful and wished him the surgery tom. To go good and short time to recover

## 2021-08-13 ENCOUNTER — Encounter
Admission: RE | Disposition: A | Discharge status: Home / Self Care | Payer: Self-pay | Source: Home / Self Care | Attending: Surgery

## 2021-08-13 ENCOUNTER — Other Ambulatory Visit: Payer: Self-pay

## 2021-08-13 ENCOUNTER — Ambulatory Visit: Payer: PPO | Admitting: Anesthesiology

## 2021-08-13 ENCOUNTER — Other Ambulatory Visit (HOSPITAL_COMMUNITY): Payer: Self-pay

## 2021-08-13 ENCOUNTER — Ambulatory Visit
Admission: RE | Admit: 2021-08-13 | Discharge: 2021-08-13 | Disposition: A | Discharge status: Home / Self Care | Payer: PPO | Attending: Surgery | Admitting: Surgery

## 2021-08-13 ENCOUNTER — Encounter: Payer: Self-pay | Admitting: Surgery

## 2021-08-13 DIAGNOSIS — Z9884 Bariatric surgery status: Secondary | ICD-10-CM | POA: Insufficient documentation

## 2021-08-13 DIAGNOSIS — Z85830 Personal history of malignant neoplasm of bone: Secondary | ICD-10-CM | POA: Diagnosis not present

## 2021-08-13 DIAGNOSIS — E039 Hypothyroidism, unspecified: Secondary | ICD-10-CM | POA: Insufficient documentation

## 2021-08-13 DIAGNOSIS — Z6830 Body mass index (BMI) 30.0-30.9, adult: Secondary | ICD-10-CM | POA: Diagnosis not present

## 2021-08-13 DIAGNOSIS — K219 Gastro-esophageal reflux disease without esophagitis: Secondary | ICD-10-CM | POA: Diagnosis not present

## 2021-08-13 DIAGNOSIS — I10 Essential (primary) hypertension: Secondary | ICD-10-CM | POA: Insufficient documentation

## 2021-08-13 DIAGNOSIS — K7460 Unspecified cirrhosis of liver: Secondary | ICD-10-CM | POA: Insufficient documentation

## 2021-08-13 DIAGNOSIS — G4730 Sleep apnea, unspecified: Secondary | ICD-10-CM | POA: Diagnosis not present

## 2021-08-13 DIAGNOSIS — D6800 Von Willebrand disease, unspecified: Secondary | ICD-10-CM | POA: Insufficient documentation

## 2021-08-13 DIAGNOSIS — E669 Obesity, unspecified: Secondary | ICD-10-CM | POA: Diagnosis not present

## 2021-08-13 DIAGNOSIS — Z8546 Personal history of malignant neoplasm of prostate: Secondary | ICD-10-CM | POA: Diagnosis not present

## 2021-08-13 DIAGNOSIS — K4090 Unilateral inguinal hernia, without obstruction or gangrene, not specified as recurrent: Secondary | ICD-10-CM

## 2021-08-13 DIAGNOSIS — D696 Thrombocytopenia, unspecified: Secondary | ICD-10-CM | POA: Insufficient documentation

## 2021-08-13 DIAGNOSIS — I2510 Atherosclerotic heart disease of native coronary artery without angina pectoris: Secondary | ICD-10-CM | POA: Diagnosis not present

## 2021-08-13 HISTORY — PX: XI ROBOTIC ASSISTED INGUINAL HERNIA REPAIR WITH MESH: SHX6706

## 2021-08-13 SURGERY — REPAIR, HERNIA, INGUINAL, ROBOT-ASSISTED, LAPAROSCOPIC, USING MESH
Anesthesia: General | Laterality: Right

## 2021-08-13 MED ORDER — PROPOFOL 10 MG/ML IV BOLUS
INTRAVENOUS | Status: AC
  Filled 2021-08-13: qty 20

## 2021-08-13 MED ORDER — EPHEDRINE SULFATE (PRESSORS) 50 MG/ML IJ SOLN
INTRAMUSCULAR | Status: DC | PRN
  Administered 2021-08-13: 5 mg via INTRAVENOUS

## 2021-08-13 MED ORDER — ONDANSETRON HCL 4 MG/2ML IJ SOLN
INTRAMUSCULAR | Status: DC | PRN
  Administered 2021-08-13: 4 mg via INTRAVENOUS

## 2021-08-13 MED ORDER — ACETAMINOPHEN 10 MG/ML IV SOLN
INTRAVENOUS | Status: AC
  Filled 2021-08-13: qty 100

## 2021-08-13 MED ORDER — ORAL CARE MOUTH RINSE: 15.0000 mL | Freq: Once | OROMUCOSAL | Status: AC

## 2021-08-13 MED ORDER — ACETAMINOPHEN 10 MG/ML IV SOLN
INTRAVENOUS | Status: DC | PRN
  Administered 2021-08-13: 1000 mg via INTRAVENOUS

## 2021-08-13 MED ORDER — LIDOCAINE HCL (PF) 2 % IJ SOLN
INTRAMUSCULAR | Status: AC
  Filled 2021-08-13: qty 5

## 2021-08-13 MED ORDER — ACETAMINOPHEN 10 MG/ML IV SOLN: 1000.0000 mg | Freq: Once | INTRAVENOUS | Status: DC | PRN

## 2021-08-13 MED ORDER — ROCURONIUM BROMIDE 10 MG/ML (PF) SYRINGE
PREFILLED_SYRINGE | INTRAVENOUS | Status: AC
  Filled 2021-08-13: qty 10

## 2021-08-13 MED ORDER — FENTANYL CITRATE (PF) 100 MCG/2ML IJ SOLN
INTRAMUSCULAR | Status: DC | PRN
  Administered 2021-08-13 (×2): 50 ug via INTRAVENOUS

## 2021-08-13 MED ORDER — CHLORHEXIDINE GLUCONATE CLOTH 2 % EX PADS: 6.0000 | MEDICATED_PAD | Freq: Once | CUTANEOUS | Status: DC

## 2021-08-13 MED ORDER — FENTANYL CITRATE (PF) 100 MCG/2ML IJ SOLN: 25.0000 ug | INTRAMUSCULAR | Status: DC | PRN

## 2021-08-13 MED ORDER — LACTATED RINGERS IV SOLN: INTRAVENOUS | Status: DC

## 2021-08-13 MED ORDER — CHLORHEXIDINE GLUCONATE 0.12 % MT SOLN
OROMUCOSAL | Status: AC
  Administered 2021-08-13: 15 mL via OROMUCOSAL
  Filled 2021-08-13: qty 15

## 2021-08-13 MED ORDER — CHLORHEXIDINE GLUCONATE 0.12 % MT SOLN: 15.0000 mL | Freq: Once | OROMUCOSAL | Status: AC

## 2021-08-13 MED ORDER — PROPOFOL 10 MG/ML IV BOLUS
INTRAVENOUS | Status: DC | PRN
  Administered 2021-08-13: 110 mg via INTRAVENOUS

## 2021-08-13 MED ORDER — DEXAMETHASONE SODIUM PHOSPHATE 10 MG/ML IJ SOLN
INTRAMUSCULAR | Status: DC | PRN
  Administered 2021-08-13: 8 mg via INTRAVENOUS

## 2021-08-13 MED ORDER — SUCCINYLCHOLINE CHLORIDE 200 MG/10ML IV SOSY
PREFILLED_SYRINGE | INTRAVENOUS | Status: DC | PRN
  Administered 2021-08-13: 120 mg via INTRAVENOUS

## 2021-08-13 MED ORDER — CEFAZOLIN SODIUM-DEXTROSE 2-4 GM/100ML-% IV SOLN
2.0000 g | INTRAVENOUS | Status: AC
  Administered 2021-08-13: 2 g via INTRAVENOUS

## 2021-08-13 MED ORDER — ONDANSETRON HCL 4 MG/2ML IJ SOLN
INTRAMUSCULAR | Status: AC
  Filled 2021-08-13: qty 2

## 2021-08-13 MED ORDER — BUPIVACAINE LIPOSOME 1.3 % IJ SUSP
INTRAMUSCULAR | Status: AC
  Filled 2021-08-13: qty 20

## 2021-08-13 MED ORDER — EPHEDRINE 5 MG/ML INJ
INTRAVENOUS | Status: AC
  Filled 2021-08-13: qty 5

## 2021-08-13 MED ORDER — SUCCINYLCHOLINE CHLORIDE 200 MG/10ML IV SOSY
PREFILLED_SYRINGE | INTRAVENOUS | Status: AC
  Filled 2021-08-13: qty 10

## 2021-08-13 MED ORDER — OXYCODONE HCL 5 MG PO TABS
5.0000 mg | ORAL_TABLET | Freq: Once | ORAL | Status: AC | PRN
  Administered 2021-08-13: 5 mg via ORAL

## 2021-08-13 MED ORDER — KETOROLAC TROMETHAMINE 30 MG/ML IJ SOLN
INTRAMUSCULAR | Status: AC
  Filled 2021-08-13: qty 1

## 2021-08-13 MED ORDER — BUPIVACAINE-EPINEPHRINE (PF) 0.25% -1:200000 IJ SOLN
INTRAMUSCULAR | Status: AC
  Filled 2021-08-13: qty 30

## 2021-08-13 MED ORDER — ONDANSETRON HCL 4 MG/2ML IJ SOLN: 4.0000 mg | Freq: Once | INTRAMUSCULAR | Status: DC | PRN

## 2021-08-13 MED ORDER — LIDOCAINE HCL (CARDIAC) PF 100 MG/5ML IV SOSY
PREFILLED_SYRINGE | INTRAVENOUS | Status: DC | PRN
  Administered 2021-08-13: 100 mg via INTRAVENOUS

## 2021-08-13 MED ORDER — HYDROCODONE-ACETAMINOPHEN 5-325 MG PO TABS: 1.0000 | ORAL_TABLET | ORAL | 0 refills | Status: DC | PRN

## 2021-08-13 MED ORDER — SUGAMMADEX SODIUM 200 MG/2ML IV SOLN
INTRAVENOUS | Status: DC | PRN
  Administered 2021-08-13: 200 mg via INTRAVENOUS

## 2021-08-13 MED ORDER — OXYCODONE HCL 5 MG/5ML PO SOLN: 5.0000 mg | Freq: Once | ORAL | Status: AC | PRN

## 2021-08-13 MED ORDER — BUPIVACAINE-EPINEPHRINE 0.25% -1:200000 IJ SOLN
INTRAMUSCULAR | Status: DC | PRN
  Administered 2021-08-13: 30 mL

## 2021-08-13 MED ORDER — SEVOFLURANE IN SOLN
RESPIRATORY_TRACT | Status: AC
  Filled 2021-08-13: qty 250

## 2021-08-13 MED ORDER — BUPIVACAINE LIPOSOME 1.3 % IJ SUSP
INTRAMUSCULAR | Status: DC | PRN
  Administered 2021-08-13: 20 mL

## 2021-08-13 MED ORDER — KETOROLAC TROMETHAMINE 30 MG/ML IJ SOLN
INTRAMUSCULAR | Status: DC | PRN
  Administered 2021-08-13: 15 mg via INTRAVENOUS

## 2021-08-13 MED ORDER — FENTANYL CITRATE (PF) 100 MCG/2ML IJ SOLN
INTRAMUSCULAR | Status: AC
  Filled 2021-08-13: qty 2

## 2021-08-13 MED ORDER — OXYCODONE HCL 5 MG PO TABS
ORAL_TABLET | ORAL | Status: AC
  Filled 2021-08-13: qty 1

## 2021-08-13 MED ORDER — ROCURONIUM BROMIDE 100 MG/10ML IV SOLN
INTRAVENOUS | Status: DC | PRN
  Administered 2021-08-13: 30 mg via INTRAVENOUS
  Administered 2021-08-13: 20 mg via INTRAVENOUS
  Administered 2021-08-13: 10 mg via INTRAVENOUS

## 2021-08-13 MED ORDER — DEXAMETHASONE SODIUM PHOSPHATE 10 MG/ML IJ SOLN
INTRAMUSCULAR | Status: AC
  Filled 2021-08-13: qty 1

## 2021-08-13 MED ORDER — CEFAZOLIN SODIUM-DEXTROSE 2-4 GM/100ML-% IV SOLN
INTRAVENOUS | Status: AC
  Filled 2021-08-13: qty 100

## 2021-08-13 SURGICAL SUPPLY — 55 items
ADH SKN CLS APL DERMABOND .7 (GAUZE/BANDAGES/DRESSINGS) ×1
APL PRP STRL LF DISP 70% ISPRP (MISCELLANEOUS) ×1
CANNULA REDUC XI 12-8 STAPL (CANNULA) ×1
CANNULA REDUCER 12-8 DVNC XI (CANNULA) ×1 IMPLANT
CHLORAPREP W/TINT 26 (MISCELLANEOUS) ×2 IMPLANT
COVER TIP SHEARS 8 DVNC (MISCELLANEOUS) ×1 IMPLANT
COVER TIP SHEARS 8MM DA VINCI (MISCELLANEOUS) ×1
COVER WAND RF STERILE (DRAPES) ×2 IMPLANT
DEFOGGER SCOPE WARMER CLEARIFY (MISCELLANEOUS) ×2 IMPLANT
DERMABOND ADVANCED (GAUZE/BANDAGES/DRESSINGS) ×1
DERMABOND ADVANCED .7 DNX12 (GAUZE/BANDAGES/DRESSINGS) ×1 IMPLANT
DRAPE 3/4 80X56 (DRAPES) ×1 IMPLANT
DRAPE ARM DVNC X/XI (DISPOSABLE) ×3 IMPLANT
DRAPE COLUMN DVNC XI (DISPOSABLE) ×1 IMPLANT
DRAPE DA VINCI XI ARM (DISPOSABLE) ×3
DRAPE DA VINCI XI COLUMN (DISPOSABLE) ×1
ELECT CAUTERY BLADE 6.4 (BLADE) ×2 IMPLANT
ELECT REM PT RETURN 9FT ADLT (ELECTROSURGICAL) ×2
ELECTRODE REM PT RTRN 9FT ADLT (ELECTROSURGICAL) ×1 IMPLANT
GLOVE SURG ENC MOIS LTX SZ7 (GLOVE) ×14 IMPLANT
GOWN STRL REUS W/ TWL LRG LVL3 (GOWN DISPOSABLE) ×4 IMPLANT
GOWN STRL REUS W/TWL LRG LVL3 (GOWN DISPOSABLE) ×14
IRRIGATION STRYKERFLOW (MISCELLANEOUS) ×1 IMPLANT
IRRIGATOR STRYKERFLOW (MISCELLANEOUS)
IV NS 1000ML (IV SOLUTION)
IV NS 1000ML BAXH (IV SOLUTION) IMPLANT
KIT PINK PAD W/HEAD ARE REST (MISCELLANEOUS) ×2
KIT PINK PAD W/HEAD ARM REST (MISCELLANEOUS) ×1 IMPLANT
LABEL OR SOLS (LABEL) ×2 IMPLANT
MANIFOLD NEPTUNE II (INSTRUMENTS) ×2 IMPLANT
MESH 3DMAX 4X6 RT LRG (Mesh General) ×1 IMPLANT
NEEDLE HYPO 22GX1.5 SAFETY (NEEDLE) ×2 IMPLANT
OBTURATOR OPTICAL STANDARD 8MM (TROCAR) ×1
OBTURATOR OPTICAL STND 8 DVNC (TROCAR) ×1
OBTURATOR OPTICALSTD 8 DVNC (TROCAR) ×1 IMPLANT
PACK LAP CHOLECYSTECTOMY (MISCELLANEOUS) ×2 IMPLANT
PENCIL ELECTRO HAND CTR (MISCELLANEOUS) ×2 IMPLANT
SEAL CANN UNIV 5-8 DVNC XI (MISCELLANEOUS) ×3 IMPLANT
SEAL XI 5MM-8MM UNIVERSAL (MISCELLANEOUS) ×3
SET TUBE SMOKE EVAC HIGH FLOW (TUBING) ×2 IMPLANT
SOLUTION ELECTROLUBE (MISCELLANEOUS) ×2 IMPLANT
SPONGE T-LAP 18X18 ~~LOC~~+RFID (SPONGE) ×2 IMPLANT
STAPLER CANNULA SEAL DVNC XI (STAPLE) ×1 IMPLANT
STAPLER CANNULA SEAL XI (STAPLE) ×1
SUT MNCRL AB 4-0 PS2 18 (SUTURE) ×2 IMPLANT
SUT V-LOC 90 ABS 3-0 VLT  V-20 (SUTURE) ×2
SUT V-LOC 90 ABS 3-0 VLT V-20 (SUTURE) ×2 IMPLANT
SUT VIC AB 2-0 SH 27 (SUTURE) ×4
SUT VIC AB 2-0 SH 27XBRD (SUTURE) ×2 IMPLANT
SUT VICRYL 0 AB UR-6 (SUTURE) ×4 IMPLANT
SYR 20ML LL LF (SYRINGE) ×2 IMPLANT
SYR 30ML LL (SYRINGE) ×2 IMPLANT
TAPE TRANSPORE STRL 2 31045 (GAUZE/BANDAGES/DRESSINGS) ×2 IMPLANT
TROCAR BALLN GELPORT 12X130M (ENDOMECHANICALS) ×2 IMPLANT
WATER STERILE IRR 500ML POUR (IV SOLUTION) ×1 IMPLANT

## 2021-08-13 NOTE — Interval H&P Note (Signed)
History and Physical Interval Note:  08/13/2021 12:39 PM  Dylan Ho  has presented today for surgery, with the diagnosis of right inguinal hernia.  The various methods of treatment have been discussed with the patient and family. After consideration of risks, benefits and other options for treatment, the patient has consented to  Procedure(s): XI ROBOTIC [place], possible bilateral (Right) as a surgical intervention.  The patient's history has been reviewed, patient examined, no change in status, stable for surgery.  I have reviewed the patient's chart and labs.  Questions were answered to the patient's satisfaction.     [place]

## 2021-08-13 NOTE — Discharge Instructions (Addendum)
AMBULATORY SURGERY  DISCHARGE INSTRUCTIONS   The drugs that you were given will stay in your system until tomorrow so for the next 24 hours you should not:  Drive an automobile Make any legal decisions Drink any alcoholic beverage   You may resume regular meals tomorrow.  Today it is better to start with liquids and gradually work up to solid foods.  You may eat anything you prefer, but it is better to start with liquids, then soup and crackers, and gradually work up to solid foods.   Please notify your doctor immediately if you have any unusual bleeding, trouble breathing, redness and pain at the surgery site, drainage, fever, or pain not relieved by medication.    Additional Instructions:   Please contact your physician with any problems or Same Day Surgery at [phone], Monday through Friday 6 am to 4 pm, or [place] at [facility] number at [phone].    Laparoscopic Inguinal Hernia Repair, Adult Laparoscopic inguinal hernia repair is a surgical procedure to repair a small, weak spot in the groin muscles that allows fat or intestines from inside the abdomen to bulge out (inguinal hernia). This procedure may be planned, or it may be an emergency procedure. During the procedure, tissue that has bulged out is moved back into place, and the opening in the groin muscles is repaired. This is done through three small incisions in the abdomen. A thin tube with a light and camera on the end (laparoscope) is used to help perform the procedure. Tell a health care provider about: Any allergies you have. All medicines you are taking, including vitamins, herbs, eye drops, creams, and over-the-counter medicines. Any problems you or family members have had with anesthetic medicines. Any blood disorders you have. Any surgeries you have had. Any medical conditions you have. Whether you are pregnant or may be pregnant. What are the risks? Generally, this is a safe procedure.  However, problems may occur, including: Infection. Bleeding. Allergic reactions to medicines. Damage to nearby structures or organs. Testicle damage or long-term pain and swelling of the scrotum, in males. Inability to completely empty the bladder (urinary retention). Blood clots. A collection of fluid that builds up under the skin (seroma). The hernia coming back (recurrence). What happens before the procedure? Staying hydrated Follow instructions from your health care provider about hydration, which may include: Up to 2 hours before the procedure - you may continue to drink clear liquids, such as water, clear fruit juice, black coffee, and plain tea.   Eating and drinking restrictions Follow instructions from your health care provider about eating and drinking, which may include: 8 hours before the procedure - stop eating heavy meals or foods, such as meat, fried foods, or fatty foods. 6 hours before the procedure - stop eating light meals or foods, such as toast or cereal. 6 hours before the procedure - stop drinking milk or drinks that contain milk. 2 hours before the procedure - stop drinking clear liquids. Medicines Ask your health care provider about: Changing or stopping your regular medicines. This is especially important if you are taking diabetes medicines or blood thinners. Taking medicines such as aspirin and ibuprofen. These medicines can thin your blood. Do not take these medicines unless your health care provider tells you to take them. Taking over-the-counter medicines, vitamins, herbs, and supplements. General instructions Do not use any products that contain nicotine or tobacco for at least 4 weeks before the procedure, if possible. These products include cigarettes, chewing tobacco,  and vaping devices, such as e-cigarettes. If you need help quitting, ask your health care provider. Ask your health care provider: How your surgery site will be marked. What steps will be  taken to help prevent infection. These steps may include: Removing hair at the surgery site. Washing skin with a germ-killing soap. Taking antibiotic medicine. Plan to have a responsible adult take you home from the hospital or clinic. Plan to have a responsible adult care for you for the time you are told after you leave the hospital or clinic. This is important. What happens during the procedure? An IV will be inserted into one of your veins. You will be given one or more of the following: A medicine to help you relax (sedative). A medicine to make you fall asleep (general anesthetic). Three small incisions will be made in your abdomen. Your abdomen will be inflated with carbon dioxide gas to make the surgical area easier to see. A laparoscope and surgical instruments will be inserted through the incisions. The laparoscope will send images of the inside of your abdomen to a monitor in the room. Tissue that is bulging through the hernia may be removed or moved back into place. The hernia opening will be closed with a sheet of surgical mesh. The surgical instruments and laparoscope will be removed. Your incisions will be closed with stitches (sutures) and adhesive strips. A bandage (dressing) will be placed over your incisions. The procedure may vary among health care providers and hospitals. What happens after the procedure? Your blood pressure, heart rate, breathing rate, and blood oxygen level will be monitored until you leave the hospital or clinic. You will be given pain medicine as needed. You may continue to receive medicines and fluids through an IV. The IV will be removed after you can drink fluids. You will be encouraged to get up and move around and to take deep breaths frequently. If you were given a sedative during the procedure, it can affect you for several hours. Do not drive or operate machinery until your health care provider says that it is safe. Summary Laparoscopic  inguinal hernia repair is a surgical procedure to repair a small, weak spot in the groin muscles that allows fat or intestines from inside the abdomen to bulge out (inguinal hernia). This procedure is done through three small incisions in the abdomen. A thin tube with a light and camera on the end (laparoscope) is used to help perform the procedure. After the procedure, you will be encouraged to get up and move around and to take deep breaths frequently. This information is not intended to replace advice given to you by your health care provider. Make sure you discuss any questions you have with your health care provider. Document Revised: 02/21/2020 Document Reviewed: 02/21/2020 Elsevier Patient Education  2022 [employer].

## 2021-08-13 NOTE — Anesthesia Procedure Notes (Signed)
Procedure Name: Intubation Date/Time: 08/13/2021 1:08 PM Performed by: Hedda Slade, CRNA Pre-anesthesia Checklist: Patient identified, Patient being monitored, Timeout performed, Emergency Drugs available and Suction available Patient Re-evaluated:Patient Re-evaluated prior to induction Oxygen Delivery Method: Circle system utilized Preoxygenation: Pre-oxygenation with 100% oxygen Induction Type: IV induction Ventilation: Mask ventilation without difficulty Laryngoscope Size: 4 and McGraph Grade View: Grade I Tube type: Oral Tube size: 7.5 mm Number of attempts: 1 Airway Equipment and Method: Stylet Placement Confirmation: ETT inserted through vocal cords under direct vision, positive ETCO2 and breath sounds checked- equal and bilateral Secured at: 21 cm Tube secured with: Tape Dental Injury: Teeth and Oropharynx as per pre-operative assessment

## 2021-08-13 NOTE — Transfer of Care (Signed)
Immediate Anesthesia Transfer of Care Note  Patient: Dylan Ho  Procedure(s) Performed: XI ROBOTIC ASSISTED INGUINAL HERNIA REPAIR WITH MESH, possible bilateral (Right)  Patient Location: PACU  Anesthesia Type:General  Level of Consciousness: awake, alert  and oriented  Airway & Oxygen Therapy: Patient Spontanous Breathing  Post-op Assessment: Report given to RN and Post -op Vital signs reviewed and stable  Post vital signs: Reviewed and stable  Last Vitals:  Vitals Value Taken Time  BP 121/82 08/13/21 1447  Temp 97.24f   Pulse 78 08/13/21 1448  Resp 20 08/13/21 1448  SpO2 100 % 08/13/21 1448  Vitals shown include unvalidated device data.  Last Pain:  Vitals:   08/13/21 1230  TempSrc: Temporal  PainSc: 7          Complications: No notable events documented.

## 2021-08-13 NOTE — Op Note (Signed)
Robotic assisted Laparoscopic Transabdominal Right Inguinal Hernia Repair with 3 D large Mesh      Pre-operative Diagnosis:  Right Inguinal Hernia   Post-operative Diagnosis: Same   Procedure: Robotic  Laparoscopic  repair of Right indirect inguinal hernia   Surgeon: Caroleen Hamman, MD FACS   Anesthesia: Gen. with endotracheal tube   Findings: Right indirect inguinal hernia        Procedure Details  The patient was seen again in the Holding Room. The benefits, complications, treatment options, and expected outcomes were discussed with the patient. The risks of bleeding, infection, recurrence of symptoms, failure to resolve symptoms, recurrence of hernia, ischemic orchitis, chronic pain syndrome or neuroma, were discussed again. The likelihood of improving the patient's symptoms with return to their baseline status is good.  The patient and/or family concurred with the proposed plan, giving informed consent.  The patient was taken to Operating Room, identified  and the procedure verified as Laparoscopic Inguinal Hernia Repair. Laterality confirmed. A Time Out was held and the above information confirmed.   Prior to the induction of general anesthesia, antibiotic prophylaxis was administered. VTE prophylaxis was in place. General endotracheal anesthesia was then administered and tolerated well. After the induction, the abdomen was prepped with Chloraprep and draped in the sterile fashion. The patient was positioned in the supine position.    Supraumbilical incision created and cut down technique used to enter the abdominal cavity. Fascia elevated and incised and hasson trochar placed. Pneumoperitoneum obtained w/o HD changes. No evidence of bowel injuries.  Two 8 mm placed under direct vision. The laparoscopy revealed large indirect defects. I inserted the needles and the mesh. The robot was brought ot the table and docked in the standard fashion, no collision between arms was observed. Instruments  were kept under direct view at all times. We started on the right side were a flap was created. The sac was reduced and dissected free from adjacent structures. We preserved the vas and the vessels. Once dissection was completed a large 3D mesh was placed and secured with two interrupted vicryl attached to the pubic tubercle. There was good coverage of the direct, indirect and femoral spaces. The flap was closed with v lock suture.  Second look revealed no complications or injuries.    Once assuring that hemostasis was adequate the ports were removed and a figure-of-eight 0 Vicryl suture was placed at the fascial edges. 4-0 subcuticular Monocryl was used at all skin edges. Dermabond was placed.  Patient tolerated the procedure well. There were no complications. He was taken to the recovery room in stable condition.               Caroleen Hamman, MD, FACS

## 2021-08-13 NOTE — Anesthesia Preprocedure Evaluation (Addendum)
Anesthesia Evaluation  Patient identified by MRN, date of birth, ID band Patient awake    Reviewed: Allergy & Precautions, NPO status , Patient's Chart, lab work & pertinent test results  History of Anesthesia Complications Negative for: history of anesthetic complications  Airway Mallampati: I   Neck ROM: Full    Dental  (+) Edentulous Upper, Edentulous Lower   Pulmonary sleep apnea ,    Pulmonary exam normal breath sounds clear to auscultation       Cardiovascular hypertension, + CAD  Normal cardiovascular exam Rhythm:Regular Rate:Normal  ECG 08/09/21:  Normal sinus rhythm Nonspecific ST abnormality Prolonged QT  Myocardial perfusion 08/22/19:  LVEF= 57%   FINDINGS:  Regional wall motion: reveals normal myocardial thickening and wall motion.  The overall quality of the study is good.   Artifacts noted: no  Left ventricular cavity: normal.  Perfusion Analysis: SPECT images demonstrate homogeneous tracer  distribution throughout the myocardium. 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    Musculoskeletal Gout    Abdominal   Peds  Hematology  (+) Blood dyscrasia (thrombocytopenia baseline plt 90-110, vonWillebrand disease), anemia ,   Anesthesia Other Findings   Reproductive/Obstetrics                            Anesthesia Physical Anesthesia Plan  ASA: 3  Anesthesia Plan: General   Post-op Pain Management:    Induction: Intravenous  PONV Risk Score and Plan: 2 and Ondansetron, Dexamethasone and Treatment may vary due to age or medical condition  Airway Management Planned: Oral ETT  Additional Equipment:   Intra-op Plan:   Post-operative Plan: Extubation in  OR  Informed Consent: I have reviewed the patients History and Physical, chart, labs and discussed the procedure including the risks, benefits and alternatives for the proposed anesthesia with the patient or authorized representative who has indicated his/her understanding and acceptance.     Dental advisory given  Plan Discussed with: CRNA  Anesthesia Plan Comments: (Patient consented for risks of anesthesia including but not limited to:  - adverse reactions to medications - damage to eyes, teeth, lips or other oral mucosa - nerve damage due to positioning  - sore throat or hoarseness - damage to heart, brain, nerves, lungs, other parts of body or loss of life  Informed patient about role of CRNA in peri- and intra-operative care.  Patient voiced understanding.)        Anesthesia Quick Evaluation

## 2021-08-14 ENCOUNTER — Encounter: Payer: Self-pay | Admitting: Surgery

## 2021-08-14 NOTE — Anesthesia Postprocedure Evaluation (Signed)
Anesthesia Post Note  Patient: Dylan Ho  Procedure(s) Performed: XI ROBOTIC ASSISTED INGUINAL HERNIA REPAIR WITH MESH, possible bilateral (Right)  Patient location during evaluation: PACU Anesthesia Type: General Level of consciousness: awake and alert, oriented and patient cooperative Pain management: pain level controlled Vital Signs Assessment: post-procedure vital signs reviewed and stable Respiratory status: spontaneous breathing, nonlabored ventilation and respiratory function stable Cardiovascular status: blood pressure returned to baseline and stable Postop Assessment: adequate PO intake Anesthetic complications: no   No notable events documented.   Last Vitals:  Vitals:   08/13/21 1600 08/13/21 1620  BP: 112/66 134/73  Pulse: 68 75  Resp: 12 15  Temp: (!) 36.1 C (!) 36.1 C  SpO2: 91% 95%    Last Pain:  Vitals:   08/13/21 1620  TempSrc: Temporal  PainSc: [place]

## 2021-08-15 ENCOUNTER — Other Ambulatory Visit: Payer: Self-pay

## 2021-08-15 ENCOUNTER — Inpatient Hospital Stay: Payer: PPO | Attending: Oncology | Admitting: Pharmacist

## 2021-08-15 DIAGNOSIS — C7951 Secondary malignant neoplasm of bone: Secondary | ICD-10-CM | POA: Insufficient documentation

## 2021-08-15 DIAGNOSIS — I10 Essential (primary) hypertension: Secondary | ICD-10-CM | POA: Insufficient documentation

## 2021-08-15 DIAGNOSIS — Z8042 Family history of malignant neoplasm of prostate: Secondary | ICD-10-CM | POA: Insufficient documentation

## 2021-08-15 DIAGNOSIS — C61 Malignant neoplasm of prostate: Secondary | ICD-10-CM | POA: Insufficient documentation

## 2021-08-15 DIAGNOSIS — N179 Acute kidney failure, unspecified: Secondary | ICD-10-CM | POA: Insufficient documentation

## 2021-08-15 DIAGNOSIS — Z803 Family history of malignant neoplasm of breast: Secondary | ICD-10-CM | POA: Insufficient documentation

## 2021-08-15 DIAGNOSIS — Z801 Family history of malignant neoplasm of trachea, bronchus and lung: Secondary | ICD-10-CM | POA: Insufficient documentation

## 2021-08-15 NOTE — Progress Notes (Signed)
[facility] [place]  [phone] [phone]  Patient Care Team: Kirk Ruths, MD as PCP - General (Internal Medicine) Nickie Retort, MD (Inactive) as Consulting Physician (Urology) Lucilla Lame, MD as Consulting Physician (Gastroenterology) Lovell Sheehan, MD as Consulting Physician (Orthopedic Surgery) Sindy Guadeloupe, MD as Consulting Physician (Oncology)   Name of the patient: Dylan Ho  [id number]  [birth year]   Date of visit: 08/15/21  Virtual visit: I connected with  Dylan Ho on 08/15/21 at  1:00 PM EST by a video enabled telemedicine application and verified that I am speaking with the correct person using two identifiers. I discussed the limitations of evaluation and management by telemedicine and the availability of in person appointments. The patient expressed understanding and agreed to proceed. Locations: Patient: home , Provider: in office  HPI: Patient is a 71 y.o. male with metastatic castration-resistant prostate cancer. Plan to add on Zytiga (abiraterone) and prednisone to his ADT.  Reason for Consult: Abiraterone oral chemotherapy education.   PAST MEDICAL HISTORY: Past Medical History:  Diagnosis Date   Anemia    Arthritis    knees, shoulders   CAD (coronary artery disease)    No Stents Present- per patient   Cancer ([place])    Cirrhosis ([place])    mild   Colon polyps    Essential hypertension 08/24/2008   GERD (gastroesophageal reflux disease)    patient denies   History of alcohol abuse Last Drink- 2011   History of gout    Hypertension    no longer on meds   Hypothyroidism    Intestinal ulcer    Iron deficiency anemia due to chronic blood loss 02/27/2017   Other pancytopenia ([place]) 02/25/2017   Overactive bladder    Prostate cancer ([facility])    Sleep apnea    improved since gastric bypass   Thyroid disease     HEMATOLOGY/ONCOLOGY HISTORY:  Oncology History   Prostate cancer metastatic to bone ([place])  11/16/2019 Initial Diagnosis   Prostate cancer metastatic to bone ([place])   12/16/2019 -  Chemotherapy   The patient had dexamethasone (DECADRON) 4 MG tablet, 8 mg, Oral, 2 times daily, 1 of 1 cycle, Start date: 11/16/2019, End date: -- pegfilgrastim (NEULASTA ONPRO KIT) injection 6 mg, 6 mg, Subcutaneous, Once, 6 of 6 cycles Administration: 6 mg (12/16/2019), 6 mg (01/06/2020), 6 mg (01/27/2020), 6 mg (02/17/2020), 6 mg (03/09/2020), 6 mg (03/30/2020) DOCEtaxel (TAXOTERE) 180 mg in sodium chloride 0.9 % 250 mL chemo infusion, 75 mg/m2 = 180 mg, Intravenous,  Once, 6 of 6 cycles Administration: 180 mg (12/16/2019), 180 mg (01/06/2020), 180 mg (01/27/2020), 180 mg (02/17/2020), 180 mg (03/09/2020), 180 mg (03/30/2020)   for chemotherapy treatment.       ALLERGIES:  has No Known Allergies.  MEDICATIONS:  Current Outpatient Medications  Medication Sig Dispense Refill   abiraterone acetate (ZYTIGA) 250 MG tablet Take 4 tablets (1,000 mg total) by mouth daily. Take on an empty stomach 1 hour before or 2 hours after a meal 120 tablet 0   aspirin-acetaminophen-caffeine (EXCEDRIN MIGRAINE) 250-250-65 MG tablet Take 2 tablets by mouth every 6 (six) hours as needed for headache. (Patient not taking: Reported on 08/08/2021)     HYDROcodone-acetaminophen (NORCO/VICODIN) 5-325 MG tablet Take 1-2 tablets by mouth every 4 (four) hours as needed for moderate pain. 15 tablet 0   Lactulose 20 GM/30ML SOLN Take 15-30 mLs (10-20 g total) by mouth daily as needed (constipation). (Patient taking  differently: Take 30 mLs by mouth daily.) 450 mL 2   levothyroxine (SYNTHROID) 200 MCG tablet Take 200 mcg by mouth daily.     oxyCODONE (OXY IR/ROXICODONE) 5 MG immediate release tablet Take 1 tablet (5 mg total) by mouth every 4 (four) hours as needed for severe pain. 120 tablet 0   pantoprazole (PROTONIX) 40 MG tablet Take 1 tablet (40 mg total) by mouth daily. (Patient taking differently: Take  40 mg by mouth daily as needed (acid reflux).) 30 tablet 2   predniSONE (DELTASONE) 5 MG tablet Take 1 tablet (5 mg total) by mouth 2 (two) times daily with a meal. 60 tablet 1   solifenacin (VESICARE) 10 MG tablet Take 1 tablet (10 mg total) by mouth daily. 30 tablet 1   traZODone (DESYREL) 150 MG tablet Take 4 tablets (600 mg total) by mouth at bedtime. (Patient taking differently: Take 450 mg by mouth at bedtime.) 360 tablet 1   No current facility-administered medications for this visit.   Facility-Administered Medications Ordered in Other Visits  Medication Dose Route Frequency Provider Last Rate Last Admin   sodium chloride flush (NS) 0.9 % injection 10 mL  10 mL Intravenous PRN Sindy Guadeloupe, MD   10 mL at 09/11/20 1138    VITAL SIGNS: There were no vitals taken for this visit. There were no vitals filed for this visit.  Estimated body mass index is 30.13 kg/m as calculated from the following:   Height as of 08/13/21: 5' 11"  (1.803 m).   Weight as of 08/13/21: 98 kg (216 lb).  LABS: CBC:    Component Value Date/Time   WBC 3.6 (L) 07/23/2021 1352   HGB 13.5 07/23/2021 1352   HGB 11.1 (L) 02/19/2017 1111   HCT 38.8 (L) 07/23/2021 1352   HCT 35.8 (L) 02/19/2017 1111   PLT 118 (L) 07/23/2021 1352   PLT 99 (LL) 02/19/2017 1111   MCV 92.4 07/23/2021 1352   MCV 89 02/19/2017 1111   MCV 88 07/15/2013 1327   NEUTROABS 2.6 07/23/2021 1352   NEUTROABS 1.8 11/19/2016 1113   NEUTROABS 5.0 07/15/2013 1327   LYMPHSABS 0.6 (L) 07/23/2021 1352   LYMPHSABS 0.8 11/19/2016 1113   LYMPHSABS 1.6 07/15/2013 1327   MONOABS 0.3 07/23/2021 1352   MONOABS 0.5 07/15/2013 1327   EOSABS 0.1 07/23/2021 1352   EOSABS 0.1 11/19/2016 1113   EOSABS 0.1 07/15/2013 1327   BASOSABS 0.0 07/23/2021 1352   BASOSABS 0.0 11/19/2016 1113   BASOSABS 0.1 07/15/2013 1327   Comprehensive Metabolic Panel:    Component Value Date/Time   NA 137 07/23/2021 1352   NA 145 (H) 07/09/2017 1500   NA 138 07/15/2013  1327   K 3.7 07/23/2021 1352   K 4.2 07/15/2013 1327   CL 102 07/23/2021 1352   CL 105 07/15/2013 1327   CO2 26 07/23/2021 1352   CO2 24 07/15/2013 1327   BUN 11 07/23/2021 1352   BUN 11 07/09/2017 1500   BUN 12 07/15/2013 1327   CREATININE 1.61 (H) 07/23/2021 1352   CREATININE 1.08 07/15/2013 1327   GLUCOSE 107 (H) 07/23/2021 1352   GLUCOSE 78 07/15/2013 1327   CALCIUM 8.8 (L) 07/23/2021 1352   CALCIUM 8.8 07/15/2013 1327   AST 19 07/23/2021 1352   AST 35 07/15/2013 1327   ALT 9 07/23/2021 1352   ALT 28 07/15/2013 1327   ALKPHOS 63 07/23/2021 1352   ALKPHOS 111 07/15/2013 1327   BILITOT 0.9 07/23/2021 1352   BILITOT 0.4  07/09/2017 1500   BILITOT 0.5 07/15/2013 1327   PROT 6.2 (L) 07/23/2021 1352   PROT 6.0 07/09/2017 1500   PROT 7.5 07/15/2013 1327   ALBUMIN 3.7 07/23/2021 1352   ALBUMIN 4.0 07/09/2017 1500   ALBUMIN 3.9 07/15/2013 1327     Present during today's visit: patient only  Start plan: Mr. Dylan Ho will get started on his abiraterone and prednisone tomorrow 08/16/21   Patient Education I spoke with patient for overview of new oral chemotherapy medication: abiraterone   Administration: Counseled patient on administration, dosing, side effects, monitoring, drug-food interactions, safe handling, storage, and disposal. Patient will take: Zytiga (abiraterone): Take 4 tablets (1,000 mg total) by mouth daily. Take on an empty stomach 1 hour before or 2 hours after a meal Prednisone: Take 1 tablet (5 mg total) by mouth 2 (two) times daily with a meal.  Side Effects: Side effects include but not limited to: HTN, decreased wbc, fatigue, edema. HTN: patient has blood pressure cuff at home to monitor his blood pressure. Suggested he take is blood pressure at home about 3-4 times per week and record the readings. He should also check his blood pressure if he is having s/sx of HTN.  Drug-drug Interactions (DDI): No DDIs currently  Adherence: After discussion with  patient no patient barriers to medication adherence identified.  Reviewed with patient importance of keeping a medication schedule and plan for any missed doses.  Mr. Dylan Ho voiced understanding and appreciation. All questions answered. Medication handout provided.  Provided patient with [facility] phone number. Patient knows to call the office with questions or concerns. [facility] will continue to follow.  Patient expressed understanding and was in agreement with this plan. He also understands that He can call clinic at any time with any questions, concerns, or complaints.   Medication Access Issues: No issues, patient fills at [facility].  Follow-up plan: RTC in 3 weeks for labs and 6 weeks for labs/NP/pharm  Thank you for allowing me to participate in the care of this patient.   Time Total: 20 mins  Visit consisted of counseling and education on dealing with issues of symptom management in the setting of serious and potentially life-threatening illness.Greater than 50%  of this time was spent counseling and coordinating care related to the above assessment and plan.  Signed by: Darl Pikes, PharmD, BCPS, Salley Slaughter, CPP Hematology/Oncology Clinical Pharmacist Practitioner [place]/DB/AP [facility] [phone]  08/15/2021 1:31 PM

## 2021-08-20 ENCOUNTER — Encounter: Payer: Self-pay | Admitting: Physician Assistant

## 2021-08-20 ENCOUNTER — Ambulatory Visit (INDEPENDENT_AMBULATORY_CARE_PROVIDER_SITE_OTHER): Payer: PPO | Admitting: Physician Assistant

## 2021-08-20 ENCOUNTER — Other Ambulatory Visit: Payer: Self-pay

## 2021-08-20 VITALS — BP 162/90 | HR 94 | Temp 98.4°F | Ht 71.0 in | Wt 225.0 lb

## 2021-08-20 DIAGNOSIS — K4090 Unilateral inguinal hernia, without obstruction or gangrene, not specified as recurrent: Secondary | ICD-10-CM

## 2021-08-20 DIAGNOSIS — Z09 Encounter for follow-up examination after completed treatment for conditions other than malignant neoplasm: Secondary | ICD-10-CM

## 2021-08-20 NOTE — Progress Notes (Signed)
[facility] POST-OP OFFICE VISIT  08/21/2021  HPI: Dylan Ho is a 71 y.o. male 7 days s/p robotic assisted laparoscopic right inguinal hernia repair with Dr Dahlia Byes   Had some hematuria over the weekend; now resolved Decreased appetite - worse than baseline; using Premier Protein shakes at home Pain doing well with pain medications Chronic nausea Constipated; getting lactulose No fever, chill Incisions are healing well   Vital signs: BP (!) 162/90    Pulse 94    Temp 98.4 F (36.9 C)    Ht 5\' 11"  (1.803 m)    Wt 225 lb (102.1 kg)    SpO2 97%    BMI 31.38 kg/m    Physical Exam: Constitutional: Well appearing male, NAD Abdomen: Soft, expected right inguinal soreness, non-distended, no rebound/guarding Skin: Laparoscopic incisions are healing well, no erythema or drainage   Assessment/Plan: This is a 71 y.o. male 7 days s/p robotic assisted laparoscopic right inguinal hernia repair   - Pain control prn  - Encouraged Miralax in between lactulose doses to offset the constipating effects of oxycodone. Encouraged him to back down if he starts having diarrhea   - Reviewed wound care recommendation  - Reviewed lifting restrictions; 6 weeks total  - He can follow up in 3-4 weeks for re-check   -- Edison Simon, PA-C [facility] 08/21/2021, 8:06 AM [phone] M-F: 7am - 4pm

## 2021-08-20 NOTE — Patient Instructions (Signed)
If you have any concerns or questions, please feel free to call our office. See follow up appointment below.    GENERAL POST-OPERATIVE PATIENT INSTRUCTIONS   WOUND CARE INSTRUCTIONS:  Keep a dry clean dressing on the wound if there is drainage. The initial bandage may be removed after 24 hours.  Once the wound has quit draining you may leave it open to air.  If clothing rubs against the wound or causes irritation and the wound is not draining you may cover it with a dry dressing during the daytime.  Try to keep the wound dry and avoid ointments on the wound unless directed to do so.  If the wound becomes bright red and painful or starts to drain infected material that is not clear, please contact your physician immediately.  If the wound is mildly pink and has a thick firm ridge underneath it, this is normal, and is referred to as a healing ridge.  This will resolve over the next 4-6 weeks.  BATHING: You may shower if you have been informed of this by your surgeon. However, Please do not submerge in a tub, hot tub, or pool until incisions are completely sealed or have been told by your surgeon that you may do so.  DIET:  You may eat any foods that you can tolerate.  It is a good idea to eat a high fiber diet and take in plenty of fluids to prevent constipation.  If you do become constipated you may want to take a mild laxative or take ducolax tablets on a daily basis until your bowel habits are regular.  Constipation can be very uncomfortable, along with straining, after recent surgery.  ACTIVITY:  You are encouraged to cough and deep breath or use your incentive spirometer if you were given one, every 15-30 minutes when awake.  This will help prevent respiratory complications and low grade fevers post-operatively if you had a general anesthetic.  You may want to hug a pillow when coughing and sneezing to add additional support to the surgical area, if you had abdominal or chest surgery, which will  decrease pain during these times.  You are encouraged to walk and engage in light activity for the next two weeks.  You should not lift, push or pull more than 15-20 pounds, until 09/24/2021 as it could put you at increased risk for complications.  Twenty pounds is roughly equivalent to a plastic bag of groceries. At that time- Listen to your body when lifting, if you have pain when lifting, stop and then try again in a few days. Soreness after doing exercises or activities of daily living is normal as you get back in to your normal routine.  MEDICATIONS:  Try to take narcotic medications and anti-inflammatory medications, such as tylenol, ibuprofen, naprosyn, etc., with food.  This will minimize stomach upset from the medication.  Should you develop nausea and vomiting from the pain medication, or develop a rash, please discontinue the medication and contact your physician.  You should not drive, make important decisions, or operate machinery when taking narcotic pain medication.  SUNBLOCK Use sun block to incision area over the next year if this area will be exposed to sun. This helps decrease scarring and will allow you avoid a permanent darkened area over your incision.  QUESTIONS:  Please feel free to call our office if you have any questions, and we will be glad to assist you.

## 2021-08-21 ENCOUNTER — Encounter: Payer: Self-pay | Admitting: Physician Assistant

## 2021-08-22 ENCOUNTER — Other Ambulatory Visit: Payer: Self-pay

## 2021-08-22 ENCOUNTER — Inpatient Hospital Stay: Payer: PPO

## 2021-08-22 ENCOUNTER — Inpatient Hospital Stay (HOSPITAL_BASED_OUTPATIENT_CLINIC_OR_DEPARTMENT_OTHER): Payer: PPO | Admitting: Nurse Practitioner

## 2021-08-22 ENCOUNTER — Inpatient Hospital Stay
Admission: EM | Admit: 2021-08-22 | Discharge: 2021-08-28 | DRG: 673 | Disposition: A | Discharge status: Home / Self Care | Payer: PPO | Attending: Internal Medicine | Admitting: Internal Medicine

## 2021-08-22 ENCOUNTER — Telehealth: Payer: Self-pay | Admitting: *Deleted

## 2021-08-22 ENCOUNTER — Encounter: Payer: Self-pay | Admitting: Emergency Medicine

## 2021-08-22 ENCOUNTER — Ambulatory Visit: Payer: PPO

## 2021-08-22 ENCOUNTER — Emergency Department: Payer: PPO

## 2021-08-22 VITALS — BP 129/71 | HR 85 | Temp 98.5°F | Resp 16

## 2021-08-22 DIAGNOSIS — N1330 Unspecified hydronephrosis: Secondary | ICD-10-CM | POA: Diagnosis present

## 2021-08-22 DIAGNOSIS — Z8042 Family history of malignant neoplasm of prostate: Secondary | ICD-10-CM

## 2021-08-22 DIAGNOSIS — K7460 Unspecified cirrhosis of liver: Secondary | ICD-10-CM | POA: Diagnosis present

## 2021-08-22 DIAGNOSIS — R600 Localized edema: Secondary | ICD-10-CM | POA: Diagnosis present

## 2021-08-22 DIAGNOSIS — N3281 Overactive bladder: Secondary | ICD-10-CM | POA: Diagnosis present

## 2021-08-22 DIAGNOSIS — G9341 Metabolic encephalopathy: Secondary | ICD-10-CM | POA: Diagnosis present

## 2021-08-22 DIAGNOSIS — Z79899 Other long term (current) drug therapy: Secondary | ICD-10-CM

## 2021-08-22 DIAGNOSIS — R0602 Shortness of breath: Secondary | ICD-10-CM | POA: Diagnosis not present

## 2021-08-22 DIAGNOSIS — Z79890 Hormone replacement therapy (postmenopausal): Secondary | ICD-10-CM

## 2021-08-22 DIAGNOSIS — K219 Gastro-esophageal reflux disease without esophagitis: Secondary | ICD-10-CM | POA: Diagnosis present

## 2021-08-22 DIAGNOSIS — E039 Hypothyroidism, unspecified: Secondary | ICD-10-CM | POA: Diagnosis not present

## 2021-08-22 DIAGNOSIS — Z8249 Family history of ischemic heart disease and other diseases of the circulatory system: Secondary | ICD-10-CM | POA: Diagnosis not present

## 2021-08-22 DIAGNOSIS — N179 Acute kidney failure, unspecified: Secondary | ICD-10-CM

## 2021-08-22 DIAGNOSIS — E875 Hyperkalemia: Secondary | ICD-10-CM | POA: Diagnosis present

## 2021-08-22 DIAGNOSIS — R531 Weakness: Secondary | ICD-10-CM | POA: Diagnosis not present

## 2021-08-22 DIAGNOSIS — Z20822 Contact with and (suspected) exposure to covid-19: Secondary | ICD-10-CM | POA: Diagnosis not present

## 2021-08-22 DIAGNOSIS — K5909 Other constipation: Secondary | ICD-10-CM | POA: Diagnosis present

## 2021-08-22 DIAGNOSIS — E861 Hypovolemia: Secondary | ICD-10-CM | POA: Diagnosis not present

## 2021-08-22 DIAGNOSIS — C61 Malignant neoplasm of prostate: Secondary | ICD-10-CM

## 2021-08-22 DIAGNOSIS — M19011 Primary osteoarthritis, right shoulder: Secondary | ICD-10-CM | POA: Diagnosis present

## 2021-08-22 DIAGNOSIS — C7951 Secondary malignant neoplasm of bone: Secondary | ICD-10-CM | POA: Diagnosis not present

## 2021-08-22 DIAGNOSIS — E871 Hypo-osmolality and hyponatremia: Secondary | ICD-10-CM | POA: Diagnosis not present

## 2021-08-22 DIAGNOSIS — M170 Bilateral primary osteoarthritis of knee: Secondary | ICD-10-CM | POA: Diagnosis present

## 2021-08-22 DIAGNOSIS — E860 Dehydration: Secondary | ICD-10-CM | POA: Diagnosis not present

## 2021-08-22 DIAGNOSIS — Z801 Family history of malignant neoplasm of trachea, bronchus and lung: Secondary | ICD-10-CM | POA: Diagnosis not present

## 2021-08-22 DIAGNOSIS — R7989 Other specified abnormal findings of blood chemistry: Secondary | ICD-10-CM | POA: Diagnosis present

## 2021-08-22 DIAGNOSIS — Z96612 Presence of left artificial shoulder joint: Secondary | ICD-10-CM | POA: Diagnosis present

## 2021-08-22 DIAGNOSIS — I2510 Atherosclerotic heart disease of native coronary artery without angina pectoris: Secondary | ICD-10-CM | POA: Diagnosis not present

## 2021-08-22 DIAGNOSIS — Z6833 Body mass index (BMI) 33.0-33.9, adult: Secondary | ICD-10-CM

## 2021-08-22 DIAGNOSIS — Z86010 Personal history of colonic polyps: Secondary | ICD-10-CM

## 2021-08-22 DIAGNOSIS — N186 End stage renal disease: Secondary | ICD-10-CM

## 2021-08-22 DIAGNOSIS — Z9884 Bariatric surgery status: Secondary | ICD-10-CM

## 2021-08-22 DIAGNOSIS — Z803 Family history of malignant neoplasm of breast: Secondary | ICD-10-CM | POA: Diagnosis not present

## 2021-08-22 DIAGNOSIS — E669 Obesity, unspecified: Secondary | ICD-10-CM | POA: Diagnosis present

## 2021-08-22 DIAGNOSIS — Z96611 Presence of right artificial shoulder joint: Secondary | ICD-10-CM | POA: Diagnosis present

## 2021-08-22 DIAGNOSIS — N281 Cyst of kidney, acquired: Secondary | ICD-10-CM | POA: Diagnosis present

## 2021-08-22 DIAGNOSIS — I10 Essential (primary) hypertension: Secondary | ICD-10-CM | POA: Diagnosis not present

## 2021-08-22 DIAGNOSIS — A159 Respiratory tuberculosis unspecified: Secondary | ICD-10-CM

## 2021-08-22 DIAGNOSIS — R34 Anuria and oliguria: Secondary | ICD-10-CM

## 2021-08-22 DIAGNOSIS — R944 Abnormal results of kidney function studies: Secondary | ICD-10-CM | POA: Diagnosis not present

## 2021-08-22 DIAGNOSIS — Z992 Dependence on renal dialysis: Secondary | ICD-10-CM

## 2021-08-22 DIAGNOSIS — M19012 Primary osteoarthritis, left shoulder: Secondary | ICD-10-CM | POA: Diagnosis present

## 2021-08-22 DIAGNOSIS — R404 Transient alteration of awareness: Secondary | ICD-10-CM

## 2021-08-22 DIAGNOSIS — E8721 Acute metabolic acidosis: Secondary | ICD-10-CM | POA: Diagnosis not present

## 2021-08-22 DIAGNOSIS — R9431 Abnormal electrocardiogram [ECG] [EKG]: Secondary | ICD-10-CM | POA: Diagnosis not present

## 2021-08-22 DIAGNOSIS — Z7952 Long term (current) use of systemic steroids: Secondary | ICD-10-CM

## 2021-08-22 DIAGNOSIS — I120 Hypertensive chronic kidney disease with stage 5 chronic kidney disease or end stage renal disease: Secondary | ICD-10-CM | POA: Diagnosis present

## 2021-08-22 DIAGNOSIS — F17220 Nicotine dependence, chewing tobacco, uncomplicated: Secondary | ICD-10-CM | POA: Diagnosis present

## 2021-08-22 DIAGNOSIS — G4730 Sleep apnea, unspecified: Secondary | ICD-10-CM | POA: Diagnosis present

## 2021-08-22 LAB — COMPREHENSIVE METABOLIC PANEL
ALT: 6 U/L (ref 0–44)
AST: 18 U/L (ref 15–41)
Albumin: 3.3 g/dL — ABNORMAL LOW (ref 3.5–5.0)
Alkaline Phosphatase: 73 U/L (ref 38–126)
Anion gap: 17 — ABNORMAL HIGH (ref 5–15)
BUN: 90 mg/dL — ABNORMAL HIGH (ref 8–23)
CO2: 17 mmol/L — ABNORMAL LOW (ref 22–32)
Calcium: 8.1 mg/dL — ABNORMAL LOW (ref 8.9–10.3)
Chloride: 93 mmol/L — ABNORMAL LOW (ref 98–111)
Creatinine, Ser: 16.88 mg/dL — ABNORMAL HIGH (ref 0.61–1.24)
GFR, Estimated: 3 mL/min — ABNORMAL LOW (ref >60)
Glucose, Bld: 92 mg/dL (ref 70–99)
Potassium: 4.6 mmol/L (ref 3.5–5.1)
Sodium: 127 mmol/L — ABNORMAL LOW (ref 135–145)
Total Bilirubin: 0.6 mg/dL (ref 0.3–1.2)
Total Protein: 5.8 g/dL — ABNORMAL LOW (ref 6.5–8.1)

## 2021-08-22 LAB — BLOOD GAS, VENOUS
Acid-base deficit: 8.6 mmol/L — ABNORMAL HIGH (ref 0.0–2.0)
Bicarbonate: 17.4 mmol/L — ABNORMAL LOW (ref 20.0–28.0)
O2 Saturation: 73.7 %
Patient temperature: 37
pCO2, Ven: 37 mmHg — ABNORMAL LOW (ref 44–60)
pH, Ven: 7.28 (ref 7.25–7.43)
pO2, Ven: 45 mmHg (ref 32–45)

## 2021-08-22 LAB — RESP PANEL BY RT-PCR (FLU A&B, COVID) ARPGX2
Influenza A by PCR: NEGATIVE
Influenza B by PCR: NEGATIVE
SARS Coronavirus 2 by RT PCR: NEGATIVE

## 2021-08-22 LAB — CBC WITH DIFFERENTIAL/PLATELET
Abs Immature Granulocytes: 0.09 10*3/uL — ABNORMAL HIGH (ref 0.00–0.07)
Basophils Absolute: 0 10*3/uL (ref 0.0–0.1)
Basophils Relative: 1 %
Eosinophils Absolute: 0.1 10*3/uL (ref 0.0–0.5)
Eosinophils Relative: 2 %
HCT: 30 % — ABNORMAL LOW (ref 39.0–52.0)
Hemoglobin: 10.5 g/dL — ABNORMAL LOW (ref 13.0–17.0)
Immature Granulocytes: 2 %
Lymphocytes Relative: 10 %
Lymphs Abs: 0.4 10*3/uL — ABNORMAL LOW (ref 0.7–4.0)
MCH: 32.1 pg (ref 26.0–34.0)
MCHC: 35 g/dL (ref 30.0–36.0)
MCV: 91.7 fL (ref 80.0–100.0)
Monocytes Absolute: 0.4 10*3/uL (ref 0.1–1.0)
Monocytes Relative: 9 %
Neutro Abs: 3.3 10*3/uL (ref 1.7–7.7)
Neutrophils Relative %: 76 %
Platelets: 100 10*3/uL — ABNORMAL LOW (ref 150–400)
RBC: 3.27 MIL/uL — ABNORMAL LOW (ref 4.22–5.81)
RDW: 13.1 % (ref 11.5–15.5)
WBC: 4.2 10*3/uL (ref 4.0–10.5)
nRBC: 0 % (ref 0.0–0.2)

## 2021-08-22 MED ORDER — LACTATED RINGERS IV BOLUS: 1000.0000 mL | Freq: Once | INTRAVENOUS | Status: DC

## 2021-08-22 MED ORDER — DARIFENACIN HYDROBROMIDE ER 7.5 MG PO TB24
15.0000 mg | ORAL_TABLET | Freq: Every day | ORAL | Status: DC
  Administered 2021-08-23 – 2021-08-28 (×6): 15 mg via ORAL
  Filled 2021-08-22 (×7): qty 2

## 2021-08-22 MED ORDER — OXYCODONE HCL 5 MG PO TABS
5.0000 mg | ORAL_TABLET | Freq: Four times a day (QID) | ORAL | Status: DC | PRN
  Administered 2021-08-23 – 2021-08-27 (×3): 5 mg via ORAL
  Filled 2021-08-22 (×3): qty 1

## 2021-08-22 MED ORDER — LEVOTHYROXINE SODIUM 100 MCG PO TABS
200.0000 ug | ORAL_TABLET | Freq: Every day | ORAL | Status: DC
  Administered 2021-08-23 – 2021-08-28 (×6): 200 ug via ORAL
  Filled 2021-08-22 (×6): qty 2

## 2021-08-22 MED ORDER — SODIUM CHLORIDE 0.45 % IV SOLN
INTRAVENOUS | Status: DC
Start: 2021-08-22 — End: 2021-08-24
  Filled 2021-08-22 (×8): qty 75

## 2021-08-22 MED ORDER — PREDNISONE 10 MG PO TABS
5.0000 mg | ORAL_TABLET | Freq: Two times a day (BID) | ORAL | Status: DC
  Administered 2021-08-22 – 2021-08-28 (×11): 5 mg via ORAL
  Filled 2021-08-22 (×12): qty 1

## 2021-08-22 MED ORDER — HEPARIN SODIUM (PORCINE) 5000 UNIT/ML IJ SOLN
5000.0000 [IU] | Freq: Three times a day (TID) | INTRAMUSCULAR | Status: DC
  Administered 2021-08-22 – 2021-08-27 (×11): 5000 [IU] via SUBCUTANEOUS
  Filled 2021-08-22 (×14): qty 1

## 2021-08-22 MED ORDER — SODIUM CHLORIDE 0.9 % IV SOLN
INTRAVENOUS | Status: DC
  Filled 2021-08-22: qty 250

## 2021-08-22 MED ORDER — ABIRATERONE ACETATE 250 MG PO TABS: 1000.0000 mg | ORAL_TABLET | Freq: Every day | ORAL | Status: DC

## 2021-08-22 NOTE — ED Notes (Addendum)
Patient bladder scanned x2, volume 65ml on both devices. Ultrasound bought to room.

## 2021-08-22 NOTE — Telephone Encounter (Signed)
I received a call form patient wife stating the patient needs to come in for IV fluids today. I asked why she felt this and she stated that patient is not eating or drinking and that his tongue is dry. She reports that he has some confusion "I think from being dehydrated" and that he has started shaking now, She denies that he has fever, but states that she doe snot have a thermometer to check his temp and states he does not feel hot. Please advise

## 2021-08-22 NOTE — ED Triage Notes (Signed)
Pt comes into the ED via POV from the cancer center c/o abnormal labs.  Pt last had a creatinine of 1 and today when it was checked it had gone up to 16.  Pt is no longer making urine and states the last time he urinated was a couple days ago.  Pt presents with his PAC already accessed.

## 2021-08-22 NOTE — Telephone Encounter (Signed)
Per Dylan Ho have wife bring patient in before 33 today or he can coe tomorrow AM. I spole with wife and she said she will have him there before 315 as she only has to get him dressed and lives 5 minutes away

## 2021-08-22 NOTE — Progress Notes (Signed)
Pt in smc today. Wife states that pt has been confused and fallen 3x in the last week. She also reports that pt has had a poor appetite and is not drinking fluids. She states that the confusion and falls are new and started sometime after his surgery last week.

## 2021-08-22 NOTE — Progress Notes (Signed)
Symptom Management [place] at [facility]. 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  Chief complaint/ Reason for visit- weakness & Confusion  Heme/Onc history:  Oncology History  Prostate cancer metastatic to bone ([place])  11/16/2019 Initial Diagnosis   Prostate cancer metastatic to bone ([place])   12/16/2019 -  Chemotherapy   The patient had dexamethasone (DECADRON) 4 MG tablet, 8 mg, Oral, 2 times daily, 1 of 1 cycle, Start date: 11/16/2019, End date: -- pegfilgrastim (NEULASTA ONPRO KIT) injection 6 mg, 6 mg, Subcutaneous, Once, 6 of 6 cycles Administration: 6 mg (12/16/2019), 6 mg (01/06/2020), 6 mg (01/27/2020), 6 mg (02/17/2020), 6 mg (03/09/2020), 6 mg (03/30/2020) DOCEtaxel (TAXOTERE) 180 mg in sodium chloride 0.9 % 250 mL chemo infusion, 75 mg/m2 = 180 mg, Intravenous,  Once, 6 of 6 cycles Administration: 180 mg (12/16/2019), 180 mg (01/06/2020), 180 mg (01/27/2020), 180 mg (02/17/2020), 180 mg (03/09/2020), 180 mg (03/30/2020)   for chemotherapy treatment.       Interval history- Patient presents to clinic from home accompnaied by wife. She reports increased weakness and poor appetite since Monday and has gradually had worsening confusion since that time. Today she noted that he seemed unable to perform ADLs and significantly confused. No recent fevers, chills, or illness. He has been having  difficulty urinating and now doesn't seem to be able to urinate. Possibly urinated more than a day ago but wife is unsure. Patient is unable to contribute to history d/t mental status change.   He had hernia repair with Dr. Dahlia Byes on 08/13/21  Started zytiga on 08/16/21.   Review of systems- Review of Systems  Unable to perform ROS: Mental status change    Current treatment- zytiga + prednisone + ADT.   No Known Allergies  Past Medical History:  Diagnosis Date   Anemia    Arthritis    knees, shoulders   CAD (coronary artery disease)    No Stents Present- per patient   Cancer ([place])    Cirrhosis ([place])    mild   Colon polyps    Essential hypertension 08/24/2008   GERD (gastroesophageal reflux disease)    patient denies   History of alcohol abuse Last Drink- 2011   History of gout    Hypertension    no longer on meds   Hypothyroidism    Intestinal ulcer    Iron deficiency anemia due to chronic blood loss 02/27/2017   Other pancytopenia ([place]) 02/25/2017   Overactive bladder    Prostate cancer ([place])    Sleep apnea    improved since gastric bypass   Thyroid disease     Past Surgical History:  Procedure Laterality Date   BILATERAL TOTAL SHOULDER ARTHROPLASTY  1990's   CIRCUMCISION REVISION N/A 03/13/2017   Procedure: CIRCUMCISION REVISION;  Surgeon: Nickie Retort, MD;  Location: [facility];  Service: Urology;  Laterality: N/A;   COLONOSCOPY WITH PROPOFOL N/A 10/30/2016   Procedure: COLONOSCOPY WITH PROPOFOL;  Surgeon: Evangeline Gula  Zymir Napoli Norris, MD;  Location: [facility];  Service: Endoscopy;  Laterality: N/A;   ESOPHAGOGASTRODUODENOSCOPY (EGD) WITH PROPOFOL N/A 10/30/2016   Procedure: ESOPHAGOGASTRODUODENOSCOPY (EGD) WITH PROPOFOL;  Surgeon: Lucilla Lame, MD;  Location: [facility];  Service: Endoscopy;  Laterality: N/A;   ESOPHAGOGASTRODUODENOSCOPY (EGD) WITH PROPOFOL N/A 03/26/2017   Procedure: ESOPHAGOGASTRODUODENOSCOPY (EGD) WITH PROPOFOL;  Surgeon: Lucilla Lame, MD;  Location:  [facility];  Service: Gastroenterology;  Laterality: N/A;  requests early   GASTRIC BYPASS  2016   Dr. Darnell Level- [facility] [place]   KNEE ARTHROSCOPY  1990s   KNEE ARTHROSCOPY W/ MENISCAL REPAIR Bilateral    KNEE ARTHROSCOPY WITH MEDIAL MENISECTOMY Left 04/21/2019   Procedure: KNEE ARTHROSCOPY WITH MEDIAL MENISECTOMY;  Surgeon: Lovell Sheehan, MD;  Location: [facility];  Service: Orthopedics;  Laterality: Left;   PORTA CATH INSERTION N/A 11/23/2019   Procedure: PORTA CATH INSERTION;  Surgeon: Algernon Huxley, MD;  Location: [facility];  Service: Cardiovascular;  Laterality: N/A;   TONSILLECTOMY  2004   XI ROBOTIC ASSISTED INGUINAL HERNIA REPAIR WITH MESH Right 08/13/2021   Procedure: XI ROBOTIC ASSISTED INGUINAL HERNIA REPAIR WITH MESH, possible bilateral;  Surgeon: Jules Husbands, MD;  Location: [facility];  Service: General;  Laterality: Right;    Social History   Socioeconomic History   Marital status: Married    Spouse name: Not on file   Number of children: Not on file   Years of education: Not on file   Highest education level: Not on file  Occupational History   Not on file  Tobacco Use   Smoking status: Never   Smokeless tobacco: Current    Types: Chew  Vaping Use   Vaping Use: Never used  Substance and Sexual Activity   Alcohol use: No    Comment: Heavy ETOH in past- Last Drink 2011 per patient   Drug use: No   Sexual activity: Not Currently  Other Topics Concern   Not on file  Social History Narrative   Not on file   Social Determinants of Health   Financial Resource Strain: Not on file  Food Insecurity: Not on file  Transportation Needs: Not on file  Physical Activity: Not on file  Stress: Not on file  Social Connections: Not on file  Intimate Partner Violence: Not on file    Family History  Problem Relation Age of Onset   Stroke Mother    Breast cancer Mother    Heart disease Father    Stroke Father    AAA (abdominal aortic aneurysm)  Father    Lung cancer Paternal Grandmother    Prostate cancer Brother    Bladder Cancer Neg Hx    Kidney cancer Neg Hx      Current Outpatient Medications:    abiraterone acetate (ZYTIGA) 250 MG tablet, Take 4 tablets (1,000 mg total) by mouth daily. Take on an empty stomach 1 hour before or 2 hours after a meal, Disp: 120 tablet, Rfl: 0   aspirin-acetaminophen-caffeine (EXCEDRIN MIGRAINE) 250-250-65 MG tablet, Take 2 tablets by mouth every 6 (six) hours as needed for headache., Disp: , Rfl:    HYDROcodone-acetaminophen (NORCO/VICODIN) 5-325 MG tablet, Take 1-2 tablets by mouth every 4 (four) hours as needed for moderate pain., Disp: 15 tablet, Rfl: 0   Lactulose 20 GM/30ML SOLN, Take 15-30 mLs (10-20 g total) by mouth daily as needed (constipation). (Patient taking differently: Take 30 mLs by mouth daily.), Disp: 450 mL, Rfl: 2   levothyroxine (SYNTHROID) 200 MCG  tablet, Take 200 mcg by mouth daily., Disp: , Rfl:    oxyCODONE (OXY IR/ROXICODONE) 5 MG immediate release tablet, Take 1 tablet (5 mg total) by mouth every 4 (four) hours as needed for severe pain., Disp: 120 tablet, Rfl: 0   pantoprazole (PROTONIX) 40 MG tablet, Take 1 tablet (40 mg total) by mouth daily. (Patient taking differently: Take 40 mg by mouth daily as needed (acid reflux).), Disp: 30 tablet, Rfl: 2   predniSONE (DELTASONE) 5 MG tablet, Take 1 tablet (5 mg total) by mouth 2 (two) times daily with a meal., Disp: 60 tablet, Rfl: 1   solifenacin (VESICARE) 10 MG tablet, Take 1 tablet (10 mg total) by mouth daily., Disp: 30 tablet, Rfl: 1   traZODone (DESYREL) 150 MG tablet, Take 4 tablets (600 mg total) by mouth at bedtime. (Patient taking differently: Take 450 mg by mouth at bedtime.), Disp: 360 tablet, Rfl: 1 No current facility-administered medications for this visit.  Facility-Administered Medications Ordered in Other Visits:    0.9 %  sodium chloride infusion, , Intravenous, Continuous, Consandra Laske G, NP   sodium  chloride flush (NS) 0.9 % injection 10 mL, 10 mL, Intravenous, PRN, Sindy Guadeloupe, MD, 10 mL at 09/11/20 1138  Physical exam:  Vitals:   08/22/21 1522  BP: 129/71  Pulse: 85  Resp: 16  Temp: 98.5 F (36.9 C)  TempSrc: Tympanic  SpO2: 97%   Physical Exam Vitals and nursing note reviewed.  Constitutional:      Appearance: He is ill-appearing.     Comments: Accompanied by wife. In recliner in exam room.   Skin:    Coloration: Skin is pale. Skin is not jaundiced.  Neurological:     Mental Status: He is disoriented.  Psychiatric:        Mood and Affect: Affect normal.     CMP Latest Ref Rng & Units 08/22/2021  Glucose 70 - 99 mg/dL 92  BUN 8 - 23 mg/dL 90(H)  Creatinine 0.61 - 1.24 mg/dL 16.88(H)  Sodium 135 - 145 mmol/L 127(L)  Potassium 3.5 - 5.1 mmol/L 4.6  Chloride 98 - 111 mmol/L 93(L)  CO2 22 - 32 mmol/L 17(L)  Calcium 8.9 - 10.3 mg/dL 8.1(L)  Total Protein 6.5 - 8.1 g/dL 5.8(L)  Total Bilirubin 0.3 - 1.2 mg/dL 0.6  Alkaline Phos 38 - 126 U/L 73  AST 15 - 41 U/L 18  ALT 0 - 44 U/L 6   CBC Latest Ref Rng & Units 08/22/2021  WBC 4.0 - 10.5 K/uL 4.2  Hemoglobin 13.0 - 17.0 g/dL 10.5(L)  Hematocrit 39.0 - 52.0 % 30.0(L)  Platelets 150 - 400 K/uL 100(L)    No images are attached to the encounter.  NM PET (PSMA) SKULL TO MID THIGH  Result Date: 08/01/2021 CLINICAL DATA:  71 year old male with metastatic prostate cancer. Bone metastasis. Increasing PSA. EXAM: NUCLEAR MEDICINE PET SKULL BASE TO THIGH TECHNIQUE: 9.5 mCi F18 Piflufolastat (Pylarify) was injected intravenously. Full-ring PET imaging was performed from the skull base to thigh after the radiotracer. CT data was obtained and used for attenuation correction and anatomic localization. COMPARISON:  None. FINDINGS: NECK No radiotracer activity in neck lymph nodes. Incidental CT finding: None CHEST No radiotracer accumulation within mediastinal or hilar lymph nodes. No suspicious pulmonary nodules on the CT scan.  Incidental CT finding: Port in the anterior chest wall with tip in distal SVC. ABDOMEN/PELVIS Prostate: No focal activity in the prostate gland. Lymph nodes: No abnormal radiotracer accumulation within pelvic  or abdominal nodes. Liver: No evidence of liver metastasis Incidental CT finding: RIGHT inguinal hernia contains a 4 cm segment of nonobstructed small bowel (image 250/3). Finding new from comparison CT. SKELETON Multiple foci of intense radiotracer activity associated with a sclerotic skeletal metastasis. Example lesion on the RIGHT at the pubic symphysis with SUV max equal 26.4 (image 255). Large rounded sclerotic lesion in the L3 vertebral body measuring 2.1 cm (image 185/series 3) with SUV max equal 63.9. Several additional radiotracer avid lesions in the thoracic spine normal cervical spine. These lesions are also associated sclerosis on CT. For example lesion at T1 with SUV max equal 10.6. Many of the sclerotic lesions do not have radiotracer activity. Example lesion at L2 is sclerotic without radiotracer activity (image 173) IMPRESSION: 1. Multiple intensely radiotracer avid sclerotic lesions in the pelvis and spine consistent with active prostate cancer skeletal metastasis. Findings similar to bone scan 04/12/2021. 2. There are multiple additional sclerotic lesions without radiotracer activity consistent with treated metastasis. 3. No evidence prostate cancer nodal metastasis or visceral metastasis. 4. New finding of a loop of small bowel entering a RIGHT inguinal hernia sac. No evidence obstruction but risk for incarceration. Electronically Signed   By: Suzy Bouchard M.D.   On: 08/01/2021 12:46    Assessment and plan- Patient is a 71 y.o. male diagnosed to prostate cancer metastatic to bone recently started zytiga + predisone + ADT who presents to [facility] for weakness and confusion.    Acute kidney failure- creatinine of 16.88 in clinic. Baseline around 1.3. Recommended  evaluation in ER. Suspect that weakness and confusion are secondary to uremia. Etiology of AKI is unclear. Recommend work up in [place]. Unlikely related to zytiga. Patient and wife agreeable. Notified Dr. Janese Banks. Notified ER.   Visit Diagnosis 1. Acute renal failure, unspecified acute renal failure type ([place])   2. Prostate cancer metastatic to bone [facility])    Patient expressed understanding and was in agreement with this plan. He also understands that He can call clinic at any time with any questions, concerns, or complaints.   Thank you for allowing me to participate in the care of this very pleasant patient.   Beckey Rutter, DNP, AGNP-C [facility] [place]

## 2021-08-22 NOTE — H&P (Signed)
History and Physical    Dylan Ho XHB:[id number] DOB: [birth year] DOA: 08/22/2021  PCP: Kirk Ruths, MD  Patient coming from: home  I have personally briefly reviewed patient's old medical records in [place]  Chief Complaint: not eating or drinking  HPI: Dylan Ho is a 71 y.o. male with medical history significant of prostate cancer with mets to bone, recent hernia surgery, hypothyroidism who presented from cancer center clinic with Cr of 16.88.  Pt reported since his hernia surgery on 08/13/21, everything tasted bad to him, including water, and had reduced oral intake.  Wife reported since Monday (4 days ago), pt only had a few bites to eat and 1 bottle of supplement per day.  No urine output for the past few days as well, though pt complained of feeling urgency.  Pt has a current Rx for oxycodone and reported taking quite a bit of it.  Pt has vague discomfort in abdomen and inguinal areas.  Pt has chronic constipation and chronic dysuria.  No fever, dyspnea, chest pain, N/V/D, increased swelling.  This morning, wife noted increased confusion so took him to cancer center where lab work showed Cr of 16.88.  ED Course: initial vitals, afebrile, pulse 85, BP 129/71, sating 97% on room air.  Labs notable for Na 127, Cr 16.88 (baseline ~1.2), BUN 90, bicarb 17, normal WBC.  Pt reported having received about 1/2 liter of IVF at cancer center clinic.  Bladder scan in the ED showed 0 ml.   Assessment/Plan Principal Problem:   AKI (acute kidney injury) ([place])  # AKI, POA --Cr 16.88 (baseline ~1.2), presumed from dehydration. Plan: --LR 1L bolus --f/b bicarb 1/2 NS@125  ml/hr --Nephrology consult  --obtain UA when pt starts making urine  # Acute metabolic encephalopathy 2/2 # Uremia  --wife noted increased confusion though seems intermittent.  BUN 90. --treat AKI as above  # Metabolic acidosis --2/2 AKI --bicarb 1/2 NS@125  ml/hr  # Prostate cancer with mets to  bone --cont home Zytiga --cont home prednisone   # Hypothyroidism --cont home Synthroid   DVT prophylaxis: Heparin SQ Code Status: Full code  Family Communication: wife updated at bedside on admission  Disposition Plan: Home  Consults called: Nephrology  Level of care: Med-Surg   Review of Systems: As per HPI otherwise complete review of systems negative.   Past Medical History:  Diagnosis Date   Anemia    Arthritis    knees, shoulders   CAD (coronary artery disease)    No Stents Present- per patient   Cancer ([place])    Cirrhosis ([place])    mild   Colon polyps    Essential hypertension 08/24/2008   GERD (gastroesophageal reflux disease)    patient denies   History of alcohol abuse Last Drink- 2011   History of gout    Hypertension    no longer on meds   Hypothyroidism    Intestinal ulcer    Iron deficiency anemia due to chronic blood loss 02/27/2017   Other pancytopenia ([place]) 02/25/2017   Overactive bladder    Prostate cancer ([place])    Sleep apnea    improved since gastric bypass   Thyroid disease     Past Surgical History:  Procedure Laterality Date   BILATERAL TOTAL SHOULDER ARTHROPLASTY  1990's   CIRCUMCISION REVISION N/A 03/13/2017   Procedure: CIRCUMCISION REVISION;  Surgeon: Nickie Retort, MD;  Location: [facility];  Service: Urology;  Laterality: N/A;   COLONOSCOPY WITH PROPOFOL N/A 10/30/2016  Procedure: COLONOSCOPY WITH PROPOFOL;  Surgeon: Lucilla Lame, MD;  Location: [facility];  Service: Endoscopy;  Laterality: N/A;   ESOPHAGOGASTRODUODENOSCOPY (EGD) WITH PROPOFOL N/A 10/30/2016   Procedure: ESOPHAGOGASTRODUODENOSCOPY (EGD) WITH PROPOFOL;  Surgeon: Lucilla Lame, MD;  Location: [facility];  Service: Endoscopy;  Laterality: N/A;   ESOPHAGOGASTRODUODENOSCOPY (EGD) WITH PROPOFOL N/A 03/26/2017   Procedure: ESOPHAGOGASTRODUODENOSCOPY (EGD) WITH PROPOFOL;  Surgeon: Lucilla Lame, MD;  Location: [facility];  Service: Gastroenterology;  Laterality:  N/A;  requests early   GASTRIC BYPASS  2016   Dr. Darnell Level- [facility], [place]   KNEE ARTHROSCOPY  1990s   KNEE ARTHROSCOPY W/ MENISCAL REPAIR Bilateral    KNEE ARTHROSCOPY WITH MEDIAL MENISECTOMY Left 04/21/2019   Procedure: KNEE ARTHROSCOPY WITH MEDIAL MENISECTOMY;  Surgeon: Lovell Sheehan, MD;  Location: [facility];  Service: Orthopedics;  Laterality: Left;   PORTA CATH INSERTION N/A 11/23/2019   Procedure: PORTA CATH INSERTION;  Surgeon: Algernon Huxley, MD;  Location: [facility];  Service: Cardiovascular;  Laterality: N/A;   TONSILLECTOMY  2004   XI ROBOTIC ASSISTED INGUINAL HERNIA REPAIR WITH MESH Right 08/13/2021   Procedure: XI ROBOTIC ASSISTED INGUINAL HERNIA REPAIR WITH MESH, possible bilateral;  Surgeon: Jules Husbands, MD;  Location: [facility];  Service: General;  Laterality: Right;     reports that he has never smoked. His smokeless tobacco use includes chew. He reports that he does not drink alcohol and does not use drugs.  No Known Allergies  Family History  Problem Relation Age of Onset   Stroke Mother    Breast cancer Mother    Heart disease Father    Stroke Father    AAA (abdominal aortic aneurysm) Father    Lung cancer Paternal Grandmother    Prostate cancer Brother    Bladder Cancer Neg Hx    Kidney cancer Neg Hx     Prior to Admission medications   Medication Sig Start Date End Date Taking? Authorizing Provider  abiraterone acetate (ZYTIGA) 250 MG tablet Take 4 tablets (1,000 mg total) by mouth daily. Take on an empty stomach 1 hour before or 2 hours after a meal 08/05/21   Sindy Guadeloupe, MD  aspirin-acetaminophen-caffeine (EXCEDRIN MIGRAINE) [phone] MG tablet Take 2 tablets by mouth every 6 (six) hours as needed for headache.    [provider]  HYDROcodone-acetaminophen (NORCO/VICODIN) 5-325 MG tablet Take 1-2 tablets by mouth every 4 (four) hours as needed for moderate pain. 08/13/21   Pabon, Diego F, MD  Lactulose 20 GM/30ML SOLN Take 15-30 mLs  (10-20 g total) by mouth daily as needed (constipation). Patient taking differently: Take 30 mLs by mouth daily. 07/12/21   Borders, Kirt Boys, NP  levothyroxine (SYNTHROID) 200 MCG tablet Take 200 mcg by mouth daily. 03/21/21   [provider]  oxyCODONE (OXY IR/ROXICODONE) 5 MG immediate release tablet Take 1 tablet (5 mg total) by mouth every 4 (four) hours as needed for severe pain. 08/12/21   Sindy Guadeloupe, MD  pantoprazole (PROTONIX) 40 MG tablet Take 1 tablet (40 mg total) by mouth daily. Patient taking differently: Take 40 mg by mouth daily as needed (acid reflux). 07/17/20   Sindy Guadeloupe, MD  predniSONE (DELTASONE) 5 MG tablet Take 1 tablet (5 mg total) by mouth 2 (two) times daily with a meal. 08/05/21   Sindy Guadeloupe, MD  solifenacin (VESICARE) 10 MG tablet Take 1 tablet (10 mg total) by mouth daily. 06/17/21   Abbie Sons, MD  traZODone (DESYREL) 150 MG tablet Take 4 tablets (600 mg total) by mouth at bedtime. Patient taking differently: Take 450 mg by mouth at bedtime. 05/17/18   Glean Hess, MD  prochlorperazine (COMPAZINE) 10 MG tablet Take 1 tablet (10 mg total) by mouth every 6 (six) hours as needed (Nausea or vomiting). Patient not taking: No sig reported 11/16/19 10/29/20  Sindy Guadeloupe, MD    Physical Exam: Vitals:   08/22/21 1611 08/22/21 1615 08/22/21 1630 08/22/21 1700  BP: (!) 164/76  (!) 148/88 124/79  Pulse: 89 89 (!) 113 77  Resp: 17 18 (!) 23   Temp: 97.6 F (36.4 C)     TempSrc: Oral     SpO2: 99% 98% 96% 94%  Weight:      Height:        Constitutional: NAD, AAOx3 HEENT: conjunctivae and lids normal, EOMI CV: No cyanosis.   RESP: normal respiratory effort, on RA Extremities: No effusions, edema in BLE SKIN: warm, dry Neuro: II - XII grossly intact.   Psych: Normal mood and affect.     Labs on Admission: I have personally reviewed following labs and imaging studies  CBC: Recent Labs  Lab 08/22/21 1532  WBC 4.2  NEUTROABS 3.3   HGB 10.5*  HCT 30.0*  MCV 91.7  PLT 852*   Basic Metabolic Panel: Recent Labs  Lab 08/22/21 1315  NA 127*  K 4.6  CL 93*  CO2 17*  GLUCOSE 92  BUN 90*  CREATININE 16.88*  CALCIUM 8.1*   GFR: Estimated Creatinine Clearance: 5.1 mL/min (A) (by C-G formula based on SCr of 16.88 mg/dL (H)). Liver Function Tests: Recent Labs  Lab 08/22/21 1315  AST 18  ALT 6  ALKPHOS 73  BILITOT 0.6  PROT 5.8*  ALBUMIN 3.3*   No results for input(s): LIPASE, AMYLASE in the last 168 hours. No results for input(s): AMMONIA in the last 168 hours. Coagulation Profile: No results for input(s): INR, PROTIME in the last 168 hours. Cardiac Enzymes: No results for input(s): CKTOTAL, CKMB, CKMBINDEX, TROPONINI in the last 168 hours. BNP (last 3 results) No results for input(s): PROBNP in the last 8760 hours. HbA1C: No results for input(s): HGBA1C in the last 72 hours. CBG: No results for input(s): GLUCAP in the last 168 hours. Lipid Profile: No results for input(s): CHOL, HDL, LDLCALC, TRIG, CHOLHDL, LDLDIRECT in the last 72 hours. Thyroid Function Tests: No results for input(s): TSH, T4TOTAL, FREET4, T3FREE, THYROIDAB in the last 72 hours. Anemia Panel: No results for input(s): VITAMINB12, FOLATE, FERRITIN, TIBC, IRON, RETICCTPCT in the last 72 hours. Urine analysis:    Component Value Date/Time   COLORURINE ORANGE (A) 07/12/2021 0935   APPEARANCEUR HAZY (A) 07/12/2021 0935   APPEARANCEUR Hazy (A) 05/20/2021 0953   LABSPEC 1.014 07/12/2021 0935   LABSPEC 1.008 07/15/2013 1327   PHURINE  07/12/2021 0935    TEST NOT REPORTED DUE TO COLOR INTERFERENCE OF URINE PIGMENT   GLUCOSEU (A) 07/12/2021 0935    TEST NOT REPORTED DUE TO COLOR INTERFERENCE OF URINE PIGMENT   GLUCOSEU Negative 07/15/2013 1327   HGBUR (A) 07/12/2021 0935    TEST NOT REPORTED DUE TO COLOR INTERFERENCE OF URINE PIGMENT   BILIRUBINUR (A) 07/12/2021 0935    TEST NOT REPORTED DUE TO COLOR INTERFERENCE OF URINE PIGMENT    BILIRUBINUR Negative 05/20/2021 0953   BILIRUBINUR Negative 07/15/2013 1327   KETONESUR (A) 07/12/2021 0935    TEST NOT REPORTED DUE TO COLOR INTERFERENCE OF URINE  PIGMENT   PROTEINUR (A) 07/12/2021 0935    TEST NOT REPORTED DUE TO COLOR INTERFERENCE OF URINE PIGMENT   NITRITE (A) 07/12/2021 0935    TEST NOT REPORTED DUE TO COLOR INTERFERENCE OF URINE PIGMENT   LEUKOCYTESUR (A) 07/12/2021 0935    TEST NOT REPORTED DUE TO COLOR INTERFERENCE OF URINE PIGMENT   LEUKOCYTESUR 2+ 07/15/2013 1327    Radiological Exams on Admission: No results found.    Enzo Bi MD [facility]  If 7PM-7AM, please contact night-coverage 08/22/2021, 5:24 PM

## 2021-08-22 NOTE — Progress Notes (Signed)
Pt transported to ED as requested by Beckey Rutter, NP. Port left accessed.

## 2021-08-22 NOTE — ED Provider Notes (Signed)
[facility] Provider Note    Event Date/Time   First MD Initiated Contact with Patient 08/22/21 1612     (approximate)   History   Abnormal Lab   HPI Dylan Ho is a 71 y.o. male with a past medical history of prostate cancer with bony metastasis currently on oral chemotherapy and prednisone as well as recent hernia repair on 08/13/2021 who presents from clinic after patient had decreasing urine output and no urine output over the last 2 days.  Patient's wife at bedside also states that he has had some confusion and altered mental status over the last 2 days as well that has been worsening.  Patient states that he has been having some suprapubic abdominal pain over this time as well and feels the need to urinate but when he tries to go nothing comes out.  Patient does endorse increasing lethargy over the past 2 days.  Denies any nausea/vomiting/diarrhea, weakness/numbness/paresthesias in extremities, chest pain, or shortness of breath     Physical Exam   Triage Vital Signs: ED Triage Vitals  Enc Vitals Group     BP 08/22/21 1611 (!) 164/76     Pulse Rate 08/22/21 1611 89     Resp 08/22/21 1611 17     Temp 08/22/21 1611 97.6 F (36.4 C)     Temp Source 08/22/21 1611 Oral     SpO2 08/22/21 1611 99 %     Weight 08/22/21 1609 225 lb (102.1 kg)     Height 08/22/21 1609 5\' 11"  (1.803 m)     Head Circumference --      Peak Flow --      Pain Score 08/22/21 1609 7     Pain Loc --      Pain Edu? --      Excl. in [place]? --     Most recent vital signs: Vitals:   08/22/21 1630 08/22/21 1700  BP: (!) 148/88 124/79  Pulse: (!) 113 77  Resp: (!) 23   Temp:    SpO2: 96% 94%    General: Awake, oriented x4. CV:  Good peripheral perfusion.  Resp:  Normal effort.  Abd:  No distention.  Other:  Overweight elderly Caucasian male sitting in bed in no distress Bedside ultrasound does not show a distended bladder   ED Results / Procedures / Treatments    Labs (all labs ordered are listed, but only abnormal results are displayed) Labs Reviewed  BLOOD GAS, VENOUS - Abnormal; Notable for the following components:      Result Value   pCO2, Ven 37 (*)    Bicarbonate 17.4 (*)    Acid-base deficit 8.6 (*)    All other components within normal limits  RESP PANEL BY RT-PCR (FLU A&B, COVID) ARPGX2  HIV ANTIBODY (ROUTINE TESTING W REFLEX)  BASIC METABOLIC PANEL  CBC  MAGNESIUM  URINALYSIS, COMPLETE (UACMP) WITH MICROSCOPIC   RADIOLOGY ED MD interpretation: Renal ultrasound independently interpreted and shows mild bilateral hydronephrosis and a simple cyst on the right kidney with normal renal cortex -Agree with radiology assessment Official radiology report(s): US Renal  Result Date: 08/22/2021 CLINICAL DATA:  Anuria , history of metastatic prostate cancer EXAM: RENAL / URINARY TRACT ULTRASOUND COMPLETE COMPARISON:  03/03/2017, 07/31/2021 FINDINGS: Right Kidney: Renal measurements: 11.4 x 6.2 by 6.0 cm = volume: 220 mL. Grossly normal right renal cortical echotexture. There is a simple 2.6 cm right renal cyst again noted. There is mild right hydronephrosis unchanged since recent  PET scan. Nonspecific 7 mm hyperechoic focus mid right kidney could reflect a cortical calcification. Left Kidney: Renal measurements: 11.2 x 8.0 x 7.3 cm = volume: 343 mL. Grossly normal left renal cortical echotexture. Mild left hydronephrosis unchanged since recent PET scan. No nephrolithiasis or renal mass. Bladder: Bladder is decompressed, and cannot be evaluated. Other: None. IMPRESSION: 1. Mild bilateral hydronephrosis, unchanged since recent PET scan. 2. Simple cyst right kidney. 3. Normal renal cortical echotexture. Electronically Signed   By: Randa Ngo M.D.   On: 08/22/2021 17:58    PROCEDURES:  Critical Care performed: No  .1-3 Lead EKG Interpretation Performed by: Naaman Plummer, MD Authorized by: Naaman Plummer, MD     Interpretation: normal      ECG rate:  76   ECG rate assessment: normal     Rhythm: sinus rhythm     Ectopy: none     Conduction: normal   MEDICATIONS ORDERED IN ED: Medications  lactated ringers bolus 1,000 mL (1,000 mLs Intravenous Not Given 08/22/21 1832)  oxyCODONE (Oxy IR/ROXICODONE) immediate release tablet 5 mg (has no administration in time range)  levothyroxine (SYNTHROID) tablet 200 mcg (has no administration in time range)  darifenacin (ENABLEX) 24 hr tablet 15 mg (has no administration in time range)  heparin injection 5,000 Units (has no administration in time range)  abiraterone acetate (ZYTIGA) tablet 1,000 mg (has no administration in time range)  predniSONE (DELTASONE) tablet 5 mg (has no administration in time range)  sodium bicarbonate 75 mEq in sodium chloride 0.45 % 1,075 mL infusion ( Intravenous New Bag/Given 08/22/21 1853)     IMPRESSION / MDM / [place] / ED COURSE  I reviewed the triage vital signs and the nursing notes.                              Differential diagnosis includes, but is not limited to, acute renal failure, acute urinary obstruction, urosepsis, urinary tract infection  The patient is on the cardiac monitor to evaluate for evidence of arrhythmia and/or significant heart rate changes.  Patient is a 71 year old male with a past medical history as described above who presents for decreasing urination that has progressed to an area over the last few days and additionally altered mental status.  Patient's laboratory evaluation prior to arrival does show a creatinine of 16 with a normal creatinine approximately 1 month ago as well as BUN of 90 which may explain patient's altered mental status.  Patient's VBG does not show any evidence of of acidosis or alkalemia but does show decreased bicarb of 17.  Given signs and symptoms of acute renal failure, patient will require admission to the internal medicine service for further evaluation and management Consults: - Urology:  Dr. Candiss Norse will see patient as an inpatient tomorrow morning - Hospitalist: Dr. Billie Ruddy agrees to accept this patient onto the the internal medicine service for further evaluation and management  Dispo: Admit to medicine       FINAL CLINICAL IMPRESSION(S) / ED DIAGNOSES   Final diagnoses:  Acute renal failure, unspecified acute renal failure type ([place])  Anuria  Transient alteration of awareness     Rx / DC Orders   ED Discharge Orders     None        Note:  This document was prepared using Dragon voice recognition software and may include unintentional dictation errors.   Naaman Plummer, MD 08/22/21 Lurena Nida

## 2021-08-23 ENCOUNTER — Other Ambulatory Visit (INDEPENDENT_AMBULATORY_CARE_PROVIDER_SITE_OTHER): Payer: Self-pay | Admitting: Nurse Practitioner

## 2021-08-23 ENCOUNTER — Encounter
Admission: EM | Disposition: A | Discharge status: Home / Self Care | Payer: Self-pay | Source: Home / Self Care | Attending: Hospitalist

## 2021-08-23 DIAGNOSIS — N179 Acute kidney failure, unspecified: Secondary | ICD-10-CM

## 2021-08-23 HISTORY — PX: TEMPORARY DIALYSIS CATHETER: CATH118312

## 2021-08-23 LAB — CBC
HCT: 30.1 % — ABNORMAL LOW (ref 39.0–52.0)
Hemoglobin: 10.4 g/dL — ABNORMAL LOW (ref 13.0–17.0)
MCH: 31.1 pg (ref 26.0–34.0)
MCHC: 34.6 g/dL (ref 30.0–36.0)
MCV: 90.1 fL (ref 80.0–100.0)
Platelets: 106 10*3/uL — ABNORMAL LOW (ref 150–400)
RBC: 3.34 MIL/uL — ABNORMAL LOW (ref 4.22–5.81)
RDW: 13.1 % (ref 11.5–15.5)
WBC: 4.3 10*3/uL (ref 4.0–10.5)
nRBC: 0 % (ref 0.0–0.2)

## 2021-08-23 LAB — BASIC METABOLIC PANEL WITH GFR
Anion gap: 15 (ref 5–15)
BUN: 92 mg/dL — ABNORMAL HIGH (ref 8–23)
CO2: 19 mmol/L — ABNORMAL LOW (ref 22–32)
Calcium: 8 mg/dL — ABNORMAL LOW (ref 8.9–10.3)
Chloride: 96 mmol/L — ABNORMAL LOW (ref 98–111)
Creatinine, Ser: 17.03 mg/dL — ABNORMAL HIGH (ref 0.61–1.24)
GFR, Estimated: 3 mL/min — ABNORMAL LOW
Glucose, Bld: 87 mg/dL (ref 70–99)
Potassium: 5.6 mmol/L — ABNORMAL HIGH (ref 3.5–5.1)
Sodium: 130 mmol/L — ABNORMAL LOW (ref 135–145)

## 2021-08-23 LAB — HIV ANTIBODY (ROUTINE TESTING W REFLEX): HIV Screen 4th Generation wRfx: NONREACTIVE

## 2021-08-23 LAB — HEPATITIS B SURFACE ANTIGEN: Hepatitis B Surface Ag: NONREACTIVE

## 2021-08-23 LAB — MAGNESIUM: Magnesium: 2.1 mg/dL (ref 1.7–2.4)

## 2021-08-23 SURGERY — TEMPORARY DIALYSIS CATHETER
Anesthesia: LOCAL

## 2021-08-23 MED ORDER — LIDOCAINE HCL (PF) 1 % IJ SOLN: 5.0000 mL | INTRAMUSCULAR | Status: DC | PRN

## 2021-08-23 MED ORDER — SODIUM CHLORIDE 0.9 % IV SOLN: 100.0000 mL | INTRAVENOUS | Status: DC | PRN

## 2021-08-23 MED ORDER — LIDOCAINE HCL (PF) 1 % IJ SOLN
INTRAMUSCULAR | Status: DC | PRN
  Administered 2021-08-23: 5 mL via INTRADERMAL

## 2021-08-23 MED ORDER — SODIUM ZIRCONIUM CYCLOSILICATE 5 G PO PACK
5.0000 g | PACK | Freq: Two times a day (BID) | ORAL | Status: DC
Start: 2021-08-23 — End: 2021-08-24
  Administered 2021-08-23 (×2): 5 g via ORAL
  Filled 2021-08-23 (×3): qty 1

## 2021-08-23 MED ORDER — CHLORHEXIDINE GLUCONATE CLOTH 2 % EX PADS
6.0000 | MEDICATED_PAD | Freq: Every day | CUTANEOUS | Status: DC
  Administered 2021-08-23 – 2021-08-28 (×6): 6 via TOPICAL

## 2021-08-23 MED ORDER — HEPARIN SODIUM (PORCINE) 1000 UNIT/ML DIALYSIS: 1000.0000 [IU] | INTRAMUSCULAR | Status: DC | PRN

## 2021-08-23 MED ORDER — LIDOCAINE-PRILOCAINE 2.5-2.5 % EX CREA: 1.0000 "application " | TOPICAL_CREAM | CUTANEOUS | Status: DC | PRN

## 2021-08-23 MED ORDER — ABIRATERONE ACETATE 250 MG PO TABS
1000.0000 mg | ORAL_TABLET | Freq: Every day | ORAL | Status: DC
  Administered 2021-08-23 – 2021-08-27 (×5): 1000 mg via ORAL
  Filled 2021-08-23 (×6): qty 4

## 2021-08-23 MED ORDER — HEPARIN SODIUM (PORCINE) 1000 UNIT/ML IJ SOLN
INTRAMUSCULAR | Status: AC
  Administered 2021-08-23: 3600 [IU] via INTRAVENOUS_CENTRAL
  Filled 2021-08-23: qty 10

## 2021-08-23 MED ORDER — PENTAFLUOROPROP-TETRAFLUOROETH EX AERO
1.0000 "application " | INHALATION_SPRAY | CUTANEOUS | Status: DC | PRN
  Filled 2021-08-23: qty 30

## 2021-08-23 MED ORDER — ALTEPLASE 2 MG IJ SOLR: 2.0000 mg | Freq: Once | INTRAMUSCULAR | Status: DC | PRN

## 2021-08-23 SURGICAL SUPPLY — 4 items
BIOPATCH RED 1 DISK 7.0 (GAUZE/BANDAGES/DRESSINGS) ×1 IMPLANT
COVER PROBE U/S 5X48 (MISCELLANEOUS) ×1 IMPLANT
KIT DIALYSIS CATH TRI 30X13 (CATHETERS) ×1 IMPLANT
PACK ANGIOGRAPHY (CUSTOM PROCEDURE TRAY) ×2 IMPLANT

## 2021-08-23 NOTE — H&P (View-Only) (Signed)
[facility] VE[facility] SPECIAL[facility] Vascular Consult Note  MRN : [id number]  Dylan Ho is a 71 y.o. (01-02-1951) male who presents with chief complaint of  Chief Complaint  Patient presents with   Abnormal Lab  .  History of Present Illness: Dylan Ho is a 71 year old male that we are asked to consult by Colon Flattery, NP.  With a past medical history of prostate cancer with metastasis currently on oral chemotherapy and prednisone with a recent hernia repair.  The patient recently had decrease in urine output noted with no urine output noted over the last 2 days.  He has also been having increasing lethargy however no chest pain, shortness of breath or decreasing mental status has been noted.  Recent vitals are noted that the patient has a creatinine of 16.8  Current Facility-Administered Medications  Medication Dose Route Frequency Provider Last Rate Last Admin   abiraterone acetate (ZYTIGA) tablet 1,000 mg  1,000 mg Oral Daily Hallaji, Sheema M, RPH       Chlorhexidine Gluconate Cloth 2 % PADS 6 each  6 each Topical Daily Enzo Bi, MD       darifenacin (ENABLEX) 24 hr tablet 15 mg  15 mg Oral Daily Enzo Bi, MD   15 mg at 08/23/21 1030   heparin injection 5,000 Units  5,000 Units Subcutaneous Q8H Enzo Bi, MD   5,000 Units at 08/23/21 2703   lactated ringers bolus 1,000 mL  1,000 mL Intravenous Once Enzo Bi, MD       levothyroxine (SYNTHROID) tablet 200 mcg  200 mcg Oral [id number] Enzo Bi, MD   200 mcg at 08/23/21 8182   oxyCODONE (Oxy IR/ROXICODONE) immediate release tablet 5 mg  5 mg Oral Q6H PRN Enzo Bi, MD       predniSONE (DELTASONE) tablet 5 mg  5 mg Oral BID WC Enzo Bi, MD   5 mg at 08/23/21 1143   sodium bicarbonate 75 mEq in sodium chloride 0.45 % 1,075 mL infusion   Intravenous Continuous Enzo Bi, MD 125 mL/hr at 08/23/21 1148 New Bag at 08/23/21 1148   sodium zirconium cyclosilicate (LOKELMA) packet 5 g  5 g Oral BID Enzo Bi, MD   5 g at 08/23/21 1030    Facility-Administered Medications Ordered in Other Encounters  Medication Dose Route Frequency Provider Last Rate Last Admin   sodium chloride flush (NS) 0.9 % injection 10 mL  10 mL Intravenous PRN Sindy Guadeloupe, MD   10 mL at 09/11/20 1138    Past Medical History:  Diagnosis Date   Anemia    Arthritis    knees, shoulders   CAD (coronary artery disease)    No Stents Present- per patient   Cancer ([place])    Cirrhosis ([place])    mild   Colon polyps    Essential hypertension 08/24/2008   GERD (gastroesophageal reflux disease)    patient denies   History of alcohol abuse Last Drink- 2011   History of gout    Hypertension    no longer on meds   Hypothyroidism    Intestinal ulcer    Iron deficiency anemia due to chronic blood loss 02/27/2017   Other pancytopenia ([place]) 02/25/2017   Overactive bladder    Prostate cancer ([place])    Sleep apnea    improved since gastric bypass   Thyroid disease     Past Surgical History:  Procedure Laterality Date   BILATERAL TOTAL SHOULDER ARTHROPLASTY  1990's   CIRCUMCISION REVISION  N/A 03/13/2017   Procedure: CIRCUMCISION REVISION;  Surgeon: Nickie Retort, MD;  Location: [facility];  Service: Urology;  Laterality: N/A;   COLONOSCOPY WITH PROPOFOL N/A 10/30/2016   Procedure: COLONOSCOPY WITH PROPOFOL;  Surgeon: Lucilla Lame, MD;  Location: [facility];  Service: Endoscopy;  Laterality: N/A;   ESOPHAGOGASTRODUODENOSCOPY (EGD) WITH PROPOFOL N/A 10/30/2016   Procedure: ESOPHAGOGASTRODUODENOSCOPY (EGD) WITH PROPOFOL;  Surgeon: Lucilla Lame, MD;  Location: [facility];  Service: Endoscopy;  Laterality: N/A;   ESOPHAGOGASTRODUODENOSCOPY (EGD) WITH PROPOFOL N/A 03/26/2017   Procedure: ESOPHAGOGASTRODUODENOSCOPY (EGD) WITH PROPOFOL;  Surgeon: Lucilla Lame, MD;  Location: [place];  Service: Gastroenterology;  Laterality: N/A;  requests early   GASTRIC BYPASS  2016   Dr. Darnell Level- [place], [place]   KNEE ARTHROSCOPY  1990s   KNEE ARTHROSCOPY W/  MENISCAL REPAIR Bilateral    KNEE ARTHROSCOPY WITH MEDIAL MENISECTOMY Left 04/21/2019   Procedure: KNEE ARTHROSCOPY WITH MEDIAL MENISECTOMY;  Surgeon: Lovell Sheehan, MD;  Location: [place];  Service: Orthopedics;  Laterality: Left;   PORTA CATH INSERTION N/A 11/23/2019   Procedure: PORTA CATH INSERTION;  Surgeon: Algernon Huxley, MD;  Location: [place] [facility];  Service: Cardiovascular;  Laterality: N/A;   TONSILLECTOMY  2004   XI ROBOTIC ASSISTED INGUINAL HERNIA REPAIR WITH MESH Right 08/13/2021   Procedure: XI ROBOTIC ASSISTED INGUINAL HERNIA REPAIR WITH MESH, possible bilateral;  Surgeon: Jules Husbands, MD;  Location: [facility];  Service: General;  Laterality: Right;    Social History Social History   Tobacco Use   Smoking status: Never   Smokeless tobacco: Current    Types: Chew  Vaping Use   Vaping Use: Never used  Substance Use Topics   Alcohol use: No    Comment: Heavy ETOH in past- Last Drink 2011 per patient   Drug use: No    Family History Family History  Problem Relation Age of Onset   Stroke Mother    Breast cancer Mother    Heart disease Father    Stroke Father    AAA (abdominal aortic aneurysm) Father    Lung cancer Paternal Grandmother    Prostate cancer Brother    Bladder Cancer Neg Hx    Kidney cancer Neg Hx     No Known Allergies   REVIEW OF SYSTEMS (Negative unless checked)  Constitutional: [] Weight loss  [] Fever  [] Chills Cardiac: [] Chest pain   [] Chest pressure   [] Palpitations   [] Shortness of breath when laying flat   [] Shortness of breath at rest   [] Shortness of breath with exertion. Vascular:  [] Pain in legs with walking   [] Pain in legs at rest   [] Pain in legs when laying flat   [] Claudication   [] Pain in feet when walking  [] Pain in feet at rest  [] Pain in feet when laying flat   [] History of DVT   [] Phlebitis   [] Swelling in legs   [] Varicose veins   [] Non-healing ulcers Pulmonary:   [] Uses home oxygen   [] Productive cough    [] Hemoptysis   [] Wheeze  [] COPD   [] Asthma Neurologic:  [] Dizziness  [] Blackouts   [] Seizures   [] History of stroke   [] History of TIA  [] Aphasia   [] Temporary blindness   [] Dysphagia   [] Weakness or numbness in arms   [] Weakness or numbness in legs Musculoskeletal:  [] Arthritis   [] Joint swelling   [] Joint pain   [] Low back pain Hematologic:  [] Easy bruising  [] Easy bleeding   [] Hypercoagulable state   [] Anemic  []   Hepatitis Gastrointestinal:  [] Blood in stool   [] Vomiting blood  [] Gastroesophageal reflux/heartburn   [] Difficulty swallowing. Genitourinary:  [] Chronic kidney disease   [x] Difficult urination  [] Frequent urination  [] Burning with urination   [] Blood in urine Skin:  [] Rashes   [] Ulcers   [] Wounds Psychological:  [] History of anxiety   []  History of major depression.  Physical Examination  Vitals:   08/22/21 1700 08/22/21 1932 08/23/21 0400 08/23/21 0757  BP: 124/79 (!) 146/74 (!) 165/73 (!) 157/67  Pulse: 77 77 66 66  Resp:  20 17 17   Temp:  98.6 F (37 C) 98.1 F (36.7 C) 97.9 F (36.6 C)  TempSrc:  Oral Oral Oral  SpO2: 94% 99% 98% 95%  Weight:      Height:       Body mass index is 31.38 kg/m. Gen:  WD/WN, NAD     CBC Lab Results  Component Value Date   WBC 4.3 08/23/2021   HGB 10.4 (L) 08/23/2021   HCT 30.1 (L) 08/23/2021   MCV 90.1 08/23/2021   PLT 106 (L) 08/23/2021    BMET    Component Value Date/Time   NA 130 (L) 08/23/2021 0530   NA 145 (H) 07/09/2017 1500   NA 138 07/15/2013 1327   K 5.6 (H) 08/23/2021 0530   K 4.2 07/15/2013 1327   CL 96 (L) 08/23/2021 0530   CL 105 07/15/2013 1327   CO2 19 (L) 08/23/2021 0530   CO2 24 07/15/2013 1327   GLUCOSE 87 08/23/2021 0530   GLUCOSE 78 07/15/2013 1327   BUN 92 (H) 08/23/2021 0530   BUN 11 07/09/2017 1500   BUN 12 07/15/2013 1327   CREATININE 17.03 (H) 08/23/2021 0530   CREATININE 1.08 07/15/2013 1327   CALCIUM 8.0 (L) 08/23/2021 0530   CALCIUM 8.8 07/15/2013 1327   GFRNONAA 3 (L) 08/23/2021  0530   GFRNONAA >60 07/15/2013 1327   GFRAA >60 03/30/2020 0837   GFRAA >60 07/15/2013 1327   Estimated Creatinine Clearance: 5 mL/min (A) (by C-G formula based on SCr of 17.03 mg/dL (H)).  COAG Lab Results  Component Value Date   INR 1.1 04/19/2019   INR 1.14 04/15/2017   INR 1.33 10/29/2016    Radiology US Renal  Result Date: 08/22/2021 CLINICAL DATA:  Anuria , history of metastatic prostate cancer EXAM: RENAL / URINARY TRACT ULTRASOUND COMPLETE COMPARISON:  03/03/2017, 07/31/2021 FINDINGS: Right Kidney: Renal measurements: 11.4 x 6.2 by 6.0 cm = volume: 220 mL. Grossly normal right renal cortical echotexture. There is a simple 2.6 cm right renal cyst again noted. There is mild right hydronephrosis unchanged since recent PET scan. Nonspecific 7 mm hyperechoic focus mid right kidney could reflect a cortical calcification. Left Kidney: Renal measurements: 11.2 x 8.0 x 7.3 cm = volume: 343 mL. Grossly normal left renal cortical echotexture. Mild left hydronephrosis unchanged since recent PET scan. No nephrolithiasis or renal mass. Bladder: Bladder is decompressed, and cannot be evaluated. Other: None. IMPRESSION: 1. Mild bilateral hydronephrosis, unchanged since recent PET scan. 2. Simple cyst right kidney. 3. Normal renal cortical echotexture. Electronically Signed   By: Randa Ngo M.D.   On: 08/22/2021 17:58   NM PET (PSMA) SKULL TO MID THIGH  Result Date: 08/01/2021 CLINICAL DATA:  71 year old male with metastatic prostate cancer. Bone metastasis. Increasing PSA. EXAM: NUCLEAR MEDICINE PET SKULL BASE TO THIGH TECHNIQUE: 9.5 mCi F18 Piflufolastat (Pylarify) was injected intravenously. Full-ring PET imaging was performed from the skull base to thigh after the radiotracer. CT data  was obtained and used for attenuation correction and anatomic localization. COMPARISON:  None. FINDINGS: NECK No radiotracer activity in neck lymph nodes. Incidental CT finding: None CHEST No radiotracer  accumulation within mediastinal or hilar lymph nodes. No suspicious pulmonary nodules on the CT scan. Incidental CT finding: Port in the anterior chest wall with tip in distal SVC. ABDOMEN/PELVIS Prostate: No focal activity in the prostate gland. Lymph nodes: No abnormal radiotracer accumulation within pelvic or abdominal nodes. Liver: No evidence of liver metastasis Incidental CT finding: RIGHT inguinal hernia contains a 4 cm segment of nonobstructed small bowel (image 250/3). Finding new from comparison CT. SKELETON Multiple foci of intense radiotracer activity associated with a sclerotic skeletal metastasis. Example lesion on the RIGHT at the pubic symphysis with SUV max equal 26.4 (image 255). Large rounded sclerotic lesion in the L3 vertebral body measuring 2.1 cm (image 185/series 3) with SUV max equal 63.9. Several additional radiotracer avid lesions in the thoracic spine normal cervical spine. These lesions are also associated sclerosis on CT. For example lesion at T1 with SUV max equal 10.6. Many of the sclerotic lesions do not have radiotracer activity. Example lesion at L2 is sclerotic without radiotracer activity (image 173) IMPRESSION: 1. Multiple intensely radiotracer avid sclerotic lesions in the pelvis and spine consistent with active prostate cancer skeletal metastasis. Findings similar to bone scan 04/12/2021. 2. There are multiple additional sclerotic lesions without radiotracer activity consistent with treated metastasis. 3. No evidence prostate cancer nodal metastasis or visceral metastasis. 4. New finding of a loop of small bowel entering a RIGHT inguinal hernia sac. No evidence obstruction but risk for incarceration. Electronically Signed   By: Suzy Bouchard M.D.   On: 08/01/2021 12:46      Assessment/Plan 1.  Acute kidney injury  The patient does have evidence of acute kidney injury.  In order to treat his acute kidney injury we will plan for PermCath placement and initiation of  dialysis.  We can follow the patient on an outpatient basis if it is found that he needs to transition to permanent dialysis.  I have discussed the risk, benefits and alternatives of the temporary catheter placement with the patient and his wife.  They agree to proceed.  Kris Hartmann, NP  08/23/2021 12:41 PM    This note was created with Dragon medical transcription system.  Any error is purely unintentional

## 2021-08-23 NOTE — TOC Initial Note (Signed)
Transition of Care [facility]) - Initial/Assessment Note    Patient Details  Name: Dylan Ho MRN: [id number] Date of Birth: Apr 01, 1951  [facility] CM/SW Contact:    Beverly Sessions, RN Phone Number: 08/23/2021, 10:15 AM  Clinical Narrative:                   Transition of Care (TOC) Screening Note   Patient Details  Name: Dylan Ho Date of Birth: 1950/10/07   Transition of Care [facility]) CM/SW Contact:    Beverly Sessions, RN Phone Number: 08/23/2021, 10:15 AM    Transition of Care Department [facility]) has reviewed patient and no TOC needs have been identified at this time. We will continue to monitor patient advancement through interdisciplinary progression rounds. If new patient transition needs arise, please place a TOC consult.         Patient Goals and CMS Choice        Expected Discharge Plan and Services                                                Prior Living Arrangements/Services                       Activities of Daily Living Home Assistive Devices/Equipment: None ADL Screening (condition at time of admission) Patient's cognitive ability adequate to safely complete daily activities?: Yes Is the patient deaf or have difficulty hearing?: Yes Does the patient have difficulty seeing, even when wearing glasses/contacts?: No Does the patient have difficulty concentrating, remembering, or making decisions?: No Patient able to express need for assistance with ADLs?: Yes Does the patient have difficulty dressing or bathing?: No Independently performs ADLs?: Yes (appropriate for developmental age) Does the patient have difficulty walking or climbing stairs?: No Weakness of Legs: None Weakness of Arms/Hands: None  Permission Sought/Granted                  Emotional Assessment              Admission diagnosis:  AKI (acute kidney injury) ([place]) [N17.9] Patient Active Problem List   Diagnosis Date Noted    AKI (acute kidney injury) ([place]) 08/22/2021   Prostate cancer metastatic to bone ([place]) 11/16/2019   Goals of care, counseling/discussion 11/16/2019   Tear of meniscus of knee 01/28/2019   Constipation 07/09/2017   Impingement syndrome of right shoulder region 05/13/2017   Iron deficiency anemia due to chronic blood loss 02/27/2017   B12 deficiency 01/09/2017   Detrusor instability 01/09/2017   Osteoarthritis of both knees 01/09/2017   Insomnia 12/12/2016   Hx of gastric bypass 12/12/2016   Cirrhosis of liver ([place]) 40/98/1191   Positive Helicobacter pylori serology 11/19/2016   Sleep apnea 11/19/2016   Von Willebrand disease 11/19/2016   Anastomotic ulcer S/P gastric bypass    Obesity (BMI 30.0-34.9) 01/19/2014   Nondependent alcohol abuse 01/11/2010   Hypothyroidism 01/11/2010   Hyperlipidemia 01/11/2010   Coronary atherosclerosis of native coronary artery 09/22/2008   PCP:  Kirk Ruths, MD Pharmacy:   [facility] [address] [place] - [place] [address] [place] [address] Phone: [phone] Fax: [phone]  [place], [place] [address] Phone: [phone] Fax: [phone]  [facility]  [address] [place] [address] Phone: [phone] Fax: [phone]     Social Determinants of Health (SDOH) Interventions    Readmission Risk Interventions No flowsheet data found.

## 2021-08-23 NOTE — Consult Note (Signed)
[facility] Vascular Consult Note  MRN : [id number]  Dylan Ho is a 71 y.o. (08-25-1950) male who presents with chief complaint of  Chief Complaint  Patient presents with   Abnormal Lab  .  History of Present Illness: Dylan Ho is a 71 year old male that we are asked to consult by Colon Flattery, NP.  With a past medical history of prostate cancer with metastasis currently on oral chemotherapy and prednisone with a recent hernia repair.  The patient recently had decrease in urine output noted with no urine output noted over the last 2 days.  He has also been having increasing lethargy however no chest pain, shortness of breath or decreasing mental status has been noted.  Recent vitals are noted that the patient has a creatinine of 16.8  Current Facility-Administered Medications  Medication Dose Route Frequency Provider Last Rate Last Admin   abiraterone acetate (ZYTIGA) tablet 1,000 mg  1,000 mg Oral Daily Hallaji, Sheema M, RPH       Chlorhexidine Gluconate Cloth 2 % PADS 6 each  6 each Topical Daily Enzo Bi, MD       darifenacin (ENABLEX) 24 hr tablet 15 mg  15 mg Oral Daily Enzo Bi, MD   15 mg at 08/23/21 1030   heparin injection 5,000 Units  5,000 Units Subcutaneous Q8H Enzo Bi, MD   5,000 Units at 08/23/21 2229   lactated ringers bolus 1,000 mL  1,000 mL Intravenous Once Enzo Bi, MD       levothyroxine (SYNTHROID) tablet 200 mcg  200 mcg Oral [id number] Enzo Bi, MD   200 mcg at 08/23/21 1194   oxyCODONE (Oxy IR/ROXICODONE) immediate release tablet 5 mg  5 mg Oral Q6H PRN Enzo Bi, MD       predniSONE (DELTASONE) tablet 5 mg  5 mg Oral BID WC Enzo Bi, MD   5 mg at 08/23/21 1143   sodium bicarbonate 75 mEq in sodium chloride 0.45 % 1,075 mL infusion   Intravenous Continuous Enzo Bi, MD 125 mL/hr at 08/23/21 1148 New Bag at 08/23/21 1148   sodium zirconium cyclosilicate (LOKELMA) packet 5 g  5 g Oral BID Enzo Bi, MD   5 g at 08/23/21 1030    Facility-Administered Medications Ordered in Other Encounters  Medication Dose Route Frequency Provider Last Rate Last Admin   sodium chloride flush (NS) 0.9 % injection 10 mL  10 mL Intravenous PRN Sindy Guadeloupe, MD   10 mL at 09/11/20 1138    Past Medical History:  Diagnosis Date   Anemia    Arthritis    knees, shoulders   CAD (coronary artery disease)    No Stents Present- per patient   Cancer ([place])    Cirrhosis ([place])    mild   Colon polyps    Essential hypertension 08/24/2008   GERD (gastroesophageal reflux disease)    patient denies   History of alcohol abuse Last Drink- 2011   History of gout    Hypertension    no longer on meds   Hypothyroidism    Intestinal ulcer    Iron deficiency anemia due to chronic blood loss 02/27/2017   Other pancytopenia ([place]) 02/25/2017   Overactive bladder    Prostate cancer ([place])    Sleep apnea    improved since gastric bypass   Thyroid disease     Past Surgical History:  Procedure Laterality Date   BILATERAL TOTAL SHOULDER ARTHROPLASTY  1990's   CIRCUMCISION REVISION  N/A 03/13/2017   Procedure: CIRCUMCISION REVISION;  Surgeon: Nickie Retort, MD;  Location: [facility];  Service: Urology;  Laterality: N/A;   COLONOSCOPY WITH PROPOFOL N/A 10/30/2016   Procedure: COLONOSCOPY WITH PROPOFOL;  Surgeon: Lucilla Lame, MD;  Location: [facility];  Service: Endoscopy;  Laterality: N/A;   ESOPHAGOGASTRODUODENOSCOPY (EGD) WITH PROPOFOL N/A 10/30/2016   Procedure: ESOPHAGOGASTRODUODENOSCOPY (EGD) WITH PROPOFOL;  Surgeon: Lucilla Lame, MD;  Location: [facility];  Service: Endoscopy;  Laterality: N/A;   ESOPHAGOGASTRODUODENOSCOPY (EGD) WITH PROPOFOL N/A 03/26/2017   Procedure: ESOPHAGOGASTRODUODENOSCOPY (EGD) WITH PROPOFOL;  Surgeon: Lucilla Lame, MD;  Location: [place];  Service: Gastroenterology;  Laterality: N/A;  requests early   GASTRIC BYPASS  2016   Dr. Darnell Level- [facility], [place]   KNEE ARTHROSCOPY  1990s   KNEE ARTHROSCOPY W/  MENISCAL REPAIR Bilateral    KNEE ARTHROSCOPY WITH MEDIAL MENISECTOMY Left 04/21/2019   Procedure: KNEE ARTHROSCOPY WITH MEDIAL MENISECTOMY;  Surgeon: Lovell Sheehan, MD;  Location: [place];  Service: Orthopedics;  Laterality: Left;   PORTA CATH INSERTION N/A 11/23/2019   Procedure: PORTA CATH INSERTION;  Surgeon: Algernon Huxley, MD;  Location: [place] [facility];  Service: Cardiovascular;  Laterality: N/A;   TONSILLECTOMY  2004   XI ROBOTIC ASSISTED INGUINAL HERNIA REPAIR WITH MESH Right 08/13/2021   Procedure: XI ROBOTIC ASSISTED INGUINAL HERNIA REPAIR WITH MESH, possible bilateral;  Surgeon: Jules Husbands, MD;  Location: [facility];  Service: General;  Laterality: Right;    Social History Social History   Tobacco Use   Smoking status: Never   Smokeless tobacco: Current    Types: Chew  Vaping Use   Vaping Use: Never used  Substance Use Topics   Alcohol use: No    Comment: Heavy ETOH in past- Last Drink 2011 per patient   Drug use: No    Family History Family History  Problem Relation Age of Onset   Stroke Mother    Breast cancer Mother    Heart disease Father    Stroke Father    AAA (abdominal aortic aneurysm) Father    Lung cancer Paternal Grandmother    Prostate cancer Brother    Bladder Cancer Neg Hx    Kidney cancer Neg Hx     No Known Allergies   REVIEW OF SYSTEMS (Negative unless checked)  Constitutional: [] Weight loss  [] Fever  [] Chills Cardiac: [] Chest pain   [] Chest pressure   [] Palpitations   [] Shortness of breath when laying flat   [] Shortness of breath at rest   [] Shortness of breath with exertion. Vascular:  [] Pain in legs with walking   [] Pain in legs at rest   [] Pain in legs when laying flat   [] Claudication   [] Pain in feet when walking  [] Pain in feet at rest  [] Pain in feet when laying flat   [] History of DVT   [] Phlebitis   [] Swelling in legs   [] Varicose veins   [] Non-healing ulcers Pulmonary:   [] Uses home oxygen   [] Productive cough    [] Hemoptysis   [] Wheeze  [] COPD   [] Asthma Neurologic:  [] Dizziness  [] Blackouts   [] Seizures   [] History of stroke   [] History of TIA  [] Aphasia   [] Temporary blindness   [] Dysphagia   [] Weakness or numbness in arms   [] Weakness or numbness in legs Musculoskeletal:  [] Arthritis   [] Joint swelling   [] Joint pain   [] Low back pain Hematologic:  [] Easy bruising  [] Easy bleeding   [] Hypercoagulable state   [] Anemic  []   Hepatitis Gastrointestinal:  [] Blood in stool   [] Vomiting blood  [] Gastroesophageal reflux/heartburn   [] Difficulty swallowing. Genitourinary:  [] Chronic kidney disease   [x] Difficult urination  [] Frequent urination  [] Burning with urination   [] Blood in urine Skin:  [] Rashes   [] Ulcers   [] Wounds Psychological:  [] History of anxiety   []  History of major depression.  Physical Examination  Vitals:   08/22/21 1700 08/22/21 1932 08/23/21 0400 08/23/21 0757  BP: 124/79 (!) 146/74 (!) 165/73 (!) 157/67  Pulse: 77 77 66 66  Resp:  20 17 17   Temp:  98.6 F (37 C) 98.1 F (36.7 C) 97.9 F (36.6 C)  TempSrc:  Oral Oral Oral  SpO2: 94% 99% 98% 95%  Weight:      Height:       Body mass index is 31.38 kg/m. Gen:  WD/WN, NAD     CBC Lab Results  Component Value Date   WBC 4.3 08/23/2021   HGB 10.4 (L) 08/23/2021   HCT 30.1 (L) 08/23/2021   MCV 90.1 08/23/2021   PLT 106 (L) 08/23/2021    BMET    Component Value Date/Time   NA 130 (L) 08/23/2021 0530   NA 145 (H) 07/09/2017 1500   NA 138 07/15/2013 1327   K 5.6 (H) 08/23/2021 0530   K 4.2 07/15/2013 1327   CL 96 (L) 08/23/2021 0530   CL 105 07/15/2013 1327   CO2 19 (L) 08/23/2021 0530   CO2 24 07/15/2013 1327   GLUCOSE 87 08/23/2021 0530   GLUCOSE 78 07/15/2013 1327   BUN 92 (H) 08/23/2021 0530   BUN 11 07/09/2017 1500   BUN 12 07/15/2013 1327   CREATININE 17.03 (H) 08/23/2021 0530   CREATININE 1.08 07/15/2013 1327   CALCIUM 8.0 (L) 08/23/2021 0530   CALCIUM 8.8 07/15/2013 1327   GFRNONAA 3 (L) 08/23/2021  0530   GFRNONAA >60 07/15/2013 1327   GFRAA >60 03/30/2020 0837   GFRAA >60 07/15/2013 1327   Estimated Creatinine Clearance: 5 mL/min (A) (by C-G formula based on SCr of 17.03 mg/dL (H)).  COAG Lab Results  Component Value Date   INR 1.1 04/19/2019   INR 1.14 04/15/2017   INR 1.33 10/29/2016    Radiology US Renal  Result Date: 08/22/2021 CLINICAL DATA:  Anuria , history of metastatic prostate cancer EXAM: RENAL / URINARY TRACT ULTRASOUND COMPLETE COMPARISON:  03/03/2017, 07/31/2021 FINDINGS: Right Kidney: Renal measurements: 11.4 x 6.2 by 6.0 cm = volume: 220 mL. Grossly normal right renal cortical echotexture. There is a simple 2.6 cm right renal cyst again noted. There is mild right hydronephrosis unchanged since recent PET scan. Nonspecific 7 mm hyperechoic focus mid right kidney could reflect a cortical calcification. Left Kidney: Renal measurements: 11.2 x 8.0 x 7.3 cm = volume: 343 mL. Grossly normal left renal cortical echotexture. Mild left hydronephrosis unchanged since recent PET scan. No nephrolithiasis or renal mass. Bladder: Bladder is decompressed, and cannot be evaluated. Other: None. IMPRESSION: 1. Mild bilateral hydronephrosis, unchanged since recent PET scan. 2. Simple cyst right kidney. 3. Normal renal cortical echotexture. Electronically Signed   By: Randa Ngo M.D.   On: 08/22/2021 17:58   NM PET (PSMA) SKULL TO MID THIGH  Result Date: 08/01/2021 CLINICAL DATA:  71 year old male with metastatic prostate cancer. Bone metastasis. Increasing PSA. EXAM: NUCLEAR MEDICINE PET SKULL BASE TO THIGH TECHNIQUE: 9.5 mCi F18 Piflufolastat (Pylarify) was injected intravenously. Full-ring PET imaging was performed from the skull base to thigh after the radiotracer. CT data  was obtained and used for attenuation correction and anatomic localization. COMPARISON:  None. FINDINGS: NECK No radiotracer activity in neck lymph nodes. Incidental CT finding: None CHEST No radiotracer  accumulation within mediastinal or hilar lymph nodes. No suspicious pulmonary nodules on the CT scan. Incidental CT finding: Port in the anterior chest wall with tip in distal SVC. ABDOMEN/PELVIS Prostate: No focal activity in the prostate gland. Lymph nodes: No abnormal radiotracer accumulation within pelvic or abdominal nodes. Liver: No evidence of liver metastasis Incidental CT finding: RIGHT inguinal hernia contains a 4 cm segment of nonobstructed small bowel (image 250/3). Finding new from comparison CT. SKELETON Multiple foci of intense radiotracer activity associated with a sclerotic skeletal metastasis. Example lesion on the RIGHT at the pubic symphysis with SUV max equal 26.4 (image 255). Large rounded sclerotic lesion in the L3 vertebral body measuring 2.1 cm (image 185/series 3) with SUV max equal 63.9. Several additional radiotracer avid lesions in the thoracic spine normal cervical spine. These lesions are also associated sclerosis on CT. For example lesion at T1 with SUV max equal 10.6. Many of the sclerotic lesions do not have radiotracer activity. Example lesion at L2 is sclerotic without radiotracer activity (image 173) IMPRESSION: 1. Multiple intensely radiotracer avid sclerotic lesions in the pelvis and spine consistent with active prostate cancer skeletal metastasis. Findings similar to bone scan 04/12/2021. 2. There are multiple additional sclerotic lesions without radiotracer activity consistent with treated metastasis. 3. No evidence prostate cancer nodal metastasis or visceral metastasis. 4. New finding of a loop of small bowel entering a RIGHT inguinal hernia sac. No evidence obstruction but risk for incarceration. Electronically Signed   By: Suzy Bouchard M.D.   On: 08/01/2021 12:46      Assessment/Plan 1.  Acute kidney injury  The patient does have evidence of acute kidney injury.  In order to treat his acute kidney injury we will plan for PermCath placement and initiation of  dialysis.  We can follow the patient on an outpatient basis if it is found that he needs to transition to permanent dialysis.  I have discussed the risk, benefits and alternatives of the temporary catheter placement with the patient and his wife.  They agree to proceed.  Kris Hartmann, NP  08/23/2021 12:41 PM    This note was created with Dragon medical transcription system.  Any error is purely unintentional

## 2021-08-23 NOTE — Progress Notes (Signed)
Hemodialysis notes  HD treatment completed. Tolerated well, no complications. Total treatment time: 2 hours. No fluid removal.

## 2021-08-23 NOTE — Consult Note (Signed)
[facility]  CONSULT NOTE    Date: 08/23/2021                  Patient Name:  Dylan Ho  MRN: [id number]  DOB: 09/14/1950  Age / Sex: 71 y.o., male         PCP: Kirk Ruths, MD                 Service Requesting Consult: [facility]                 Reason for Consult: Acute kidney injury            History of Present Illness: Mr. Dylan Ho is a 71 y.o.  male with previous medical conditions including anemia, cirrhosis, hypertension, GERD, hypothyroidism, and prostate cancer, who was admitted to [facility] on 08/22/2021 for AKI (acute kidney injury) [facility]) [N17.9]  Patient presented to the emergency department from cancer center with abnormal labs.  Outpatient labs reported creatinine 16.88.  Patient resting quietly in bed, spouse at bedside.  Spouse states patient has been unable to eat since hernia surgery on 08/13/2021.  States food does not taste right, tastes like cardboard.  States he was only able to eat a couple bites a day for the past 3 to 4 days prior to admission.  Wife scheduled appointment at cancer center when patient awoke and was very confused.  Denies use of NSAIDs.  Reports mild nausea, no vomiting or diarrhea.  Denies shortness of breath.  Reports abdominal pain for which she has a outpatient prescription for oxycodone.  Wife states he has been taking more than prescribed.  Wife states increased swelling in face.  Wife and patient states he has not urinated in 3 to 4 days.  Bladder scans completed in emergency department showed 0 mL.  Patient states he continues to complain of having the urge to urinate.  Wife states when he does urinate, he has a small amount of blood-tinged urine.  Labs on ED arrival include sodium 127, CO2 17, BUN 90, and creatinine 16.88 with GFR 3.  Respiratory panel negative for COVID-19 and flu.  Renal ultrasound shows mild bilateral hydronephrosis and simple cyst on right kidney, unchanged from previous PET scan.  Labs  completed this morning remain concerning with potassium 5.6, sodium 130, BUN 92, and creatinine 17.03 with GFR 3.   Medications: Outpatient medications: Medications Prior to Admission  Medication Sig Dispense Refill Last Dose   abiraterone acetate (ZYTIGA) 250 MG tablet Take 4 tablets (1,000 mg total) by mouth daily. Take on an empty stomach 1 hour before or 2 hours after a meal 120 tablet 0 08/22/2021 at 1030   HYDROcodone-acetaminophen (NORCO/VICODIN) 5-325 MG tablet Take 1-2 tablets by mouth every 4 (four) hours as needed for moderate pain. 15 tablet 0 08/21/2021 at pm   levothyroxine (SYNTHROID) 200 MCG tablet Take 200 mcg by mouth daily.   08/22/2021   oxyCODONE (OXY IR/ROXICODONE) 5 MG immediate release tablet Take 1 tablet (5 mg total) by mouth every 4 (four) hours as needed for severe pain. 120 tablet 0 08/22/2021 at 1030   predniSONE (DELTASONE) 5 MG tablet Take 1 tablet (5 mg total) by mouth 2 (two) times daily with a meal. 60 tablet 1 08/22/2021 at 1030   traZODone (DESYREL) 150 MG tablet Take 4 tablets (600 mg total) by mouth at bedtime. (Patient taking differently: Take 450 mg by mouth at bedtime.) 360 tablet 1 08/21/2021 at 2000  aspirin-acetaminophen-caffeine (EXCEDRIN MIGRAINE) 250-250-65 MG tablet Take 2 tablets by mouth every 6 (six) hours as needed for headache.   prn at unknown   Lactulose 20 GM/30ML SOLN Take 15-30 mLs (10-20 g total) by mouth daily as needed (constipation). (Patient taking differently: Take 30 mLs by mouth daily.) 450 mL 2 prn at unknown   pantoprazole (PROTONIX) 40 MG tablet Take 1 tablet (40 mg total) by mouth daily. (Patient taking differently: Take 40 mg by mouth daily as needed (acid reflux).) 30 tablet 2 prn at unknown   solifenacin (VESICARE) 10 MG tablet Take 1 tablet (10 mg total) by mouth daily. 30 tablet 1 prn at unknown    Current medications: Current Facility-Administered Medications  Medication Dose Route Frequency Provider Last Rate Last Admin    abiraterone acetate (ZYTIGA) tablet 1,000 mg  1,000 mg Oral Daily Hallaji, Sheema M, RPH       Chlorhexidine Gluconate Cloth 2 % PADS 6 each  6 each Topical Daily Enzo Bi, MD       darifenacin (ENABLEX) 24 hr tablet 15 mg  15 mg Oral Daily Enzo Bi, MD   15 mg at 08/23/21 1030   heparin injection 5,000 Units  5,000 Units Subcutaneous Q8H Enzo Bi, MD   5,000 Units at 08/23/21 1594   lactated ringers bolus 1,000 mL  1,000 mL Intravenous Once Enzo Bi, MD       levothyroxine (SYNTHROID) tablet 200 mcg  200 mcg Oral [id number] Enzo Bi, MD   200 mcg at 08/23/21 9244   oxyCODONE (Oxy IR/ROXICODONE) immediate release tablet 5 mg  5 mg Oral Q6H PRN Enzo Bi, MD       predniSONE (DELTASONE) tablet 5 mg  5 mg Oral BID WC Enzo Bi, MD   5 mg at 08/23/21 1143   sodium bicarbonate 75 mEq in sodium chloride 0.45 % 1,075 mL infusion   Intravenous Continuous Enzo Bi, MD 125 mL/hr at 08/23/21 1148 New Bag at 08/23/21 1148   sodium zirconium cyclosilicate (LOKELMA) packet 5 g  5 g Oral BID Enzo Bi, MD   5 g at 08/23/21 1030   Facility-Administered Medications Ordered in Other Encounters  Medication Dose Route Frequency Provider Last Rate Last Admin   sodium chloride flush (NS) 0.9 % injection 10 mL  10 mL Intravenous PRN Sindy Guadeloupe, MD   10 mL at 09/11/20 1138      Allergies: No Known Allergies    Past Medical History: Past Medical History:  Diagnosis Date   Anemia    Arthritis    knees, shoulders   CAD (coronary artery disease)    No Stents Present- per patient   Cancer ([place])    Cirrhosis ([place])    mild   Colon polyps    Essential hypertension 08/24/2008   GERD (gastroesophageal reflux disease)    patient denies   History of alcohol abuse Last Drink- 2011   History of gout    Hypertension    no longer on meds   Hypothyroidism    Intestinal ulcer    Iron deficiency anemia due to chronic blood loss 02/27/2017   Other pancytopenia ([place]) 02/25/2017   Overactive bladder    Prostate  cancer ([place])    Sleep apnea    improved since gastric bypass   Thyroid disease      Past Surgical History: Past Surgical History:  Procedure Laterality Date   BILATERAL TOTAL SHOULDER ARTHROPLASTY  1990's   CIRCUMCISION REVISION N/A 03/13/2017   Procedure: CIRCUMCISION  REVISION;  Surgeon: Nickie Retort, MD;  Location: [facility];  Service: Urology;  Laterality: N/A;   COLONOSCOPY WITH PROPOFOL N/A 10/30/2016   Procedure: COLONOSCOPY WITH PROPOFOL;  Surgeon: Lucilla Lame, MD;  Location: [facility];  Service: Endoscopy;  Laterality: N/A;   ESOPHAGOGASTRODUODENOSCOPY (EGD) WITH PROPOFOL N/A 10/30/2016   Procedure: ESOPHAGOGASTRODUODENOSCOPY (EGD) WITH PROPOFOL;  Surgeon: Lucilla Lame, MD;  Location: [facility];  Service: Endoscopy;  Laterality: N/A;   ESOPHAGOGASTRODUODENOSCOPY (EGD) WITH PROPOFOL N/A 03/26/2017   Procedure: ESOPHAGOGASTRODUODENOSCOPY (EGD) WITH PROPOFOL;  Surgeon: Lucilla Lame, MD;  Location: [place];  Service: Gastroenterology;  Laterality: N/A;  requests early   GASTRIC BYPASS  2016   Dr. Darnell Level- [facility] [place]   KNEE ARTHROSCOPY  1990s   KNEE ARTHROSCOPY W/ MENISCAL REPAIR Bilateral    KNEE ARTHROSCOPY WITH MEDIAL MENISECTOMY Left 04/21/2019   Procedure: KNEE ARTHROSCOPY WITH MEDIAL MENISECTOMY;  Surgeon: Lovell Sheehan, MD;  Location: [place];  Service: Orthopedics;  Laterality: Left;   PORTA CATH INSERTION N/A 11/23/2019   Procedure: PORTA CATH INSERTION;  Surgeon: Algernon Huxley, MD;  Location: [place] [facility];  Service: Cardiovascular;  Laterality: N/A;   TONSILLECTOMY  2004   XI ROBOTIC ASSISTED INGUINAL HERNIA REPAIR WITH MESH Right 08/13/2021   Procedure: XI ROBOTIC ASSISTED INGUINAL HERNIA REPAIR WITH MESH, possible bilateral;  Surgeon: Jules Husbands, MD;  Location: [facility];  Service: General;  Laterality: Right;     Family History: Family History  Problem Relation Age of Onset   Stroke Mother    Breast cancer Mother    Heart  disease Father    Stroke Father    AAA (abdominal aortic aneurysm) Father    Lung cancer Paternal Grandmother    Prostate cancer Brother    Bladder Cancer Neg Hx    Kidney cancer Neg Hx      Social History: Social History   Socioeconomic History   Marital status: Married    Spouse name: Not on file   Number of children: Not on file   Years of education: Not on file   Highest education level: Not on file  Occupational History   Not on file  Tobacco Use   Smoking status: Never   Smokeless tobacco: Current    Types: Chew  Vaping Use   Vaping Use: Never used  Substance and Sexual Activity   Alcohol use: No    Comment: Heavy ETOH in past- Last Drink 2011 per patient   Drug use: No   Sexual activity: Not Currently  Other Topics Concern   Not on file  Social History Narrative   Not on file   Social Determinants of Health   Financial Resource Strain: Not on file  Food Insecurity: Not on file  Transportation Needs: Not on file  Physical Activity: Not on file  Stress: Not on file  Social Connections: Not on file  Intimate Partner Violence: Not on file     Review of Systems: Review of Systems  Constitutional:  Negative for chills, fever and malaise/fatigue.  HENT:  Negative for congestion, sore throat and tinnitus.   Eyes:  Negative for blurred vision and redness.  Respiratory:  Positive for cough. Negative for shortness of breath and wheezing.   Cardiovascular:  Negative for chest pain, palpitations, claudication and leg swelling.  Gastrointestinal:  Positive for abdominal pain. Negative for blood in stool, diarrhea, nausea and vomiting.  Genitourinary:  Positive for dysuria, hematuria and urgency. Negative for flank pain and  frequency.  Musculoskeletal:  Negative for back pain, falls and myalgias.  Skin:  Negative for rash.  Neurological:  Negative for dizziness, weakness and headaches.  Endo/Heme/Allergies:  Does not bruise/bleed easily.  Psychiatric/Behavioral:   Negative for depression. The patient is not nervous/anxious and does not have insomnia.    Vital Signs: Blood pressure (!) 157/67, pulse 66, temperature 97.9 F (36.6 C), temperature source Oral, resp. rate 17, height 5\' 11"  (1.803 m), weight 102.1 kg, SpO2 95 %.  Weight trends: Filed Weights   08/22/21 1609  Weight: 102.1 kg    Physical Exam: General: NAD, resting comfortably  Head: Normocephalic, atraumatic.  Dry oral mucosal membranes  Eyes: Anicteric  Lungs:  Clear to auscultation, normal effort  Heart: Regular rate and rhythm  Abdomen:  Soft, tender  Extremities:  1+ peripheral edema.  Neurologic: Nonfocal, moving all four extremities  Skin: No lesions        Lab results: Basic Metabolic Panel: Recent Labs  Lab 08/22/21 1315 08/23/21 0530  NA 127* 130*  K 4.6 5.6*  CL 93* 96*  CO2 17* 19*  GLUCOSE 92 87  BUN 90* 92*  CREATININE 16.88* 17.03*  CALCIUM 8.1* 8.0*  MG  --  2.1    Liver Function Tests: Recent Labs  Lab 08/22/21 1315  AST 18  ALT 6  ALKPHOS 73  BILITOT 0.6  PROT 5.8*  ALBUMIN 3.3*   No results for input(s): LIPASE, AMYLASE in the last 168 hours. No results for input(s): AMMONIA in the last 168 hours.  CBC: Recent Labs  Lab 08/22/21 1532 08/23/21 0530  WBC 4.2 4.3  NEUTROABS 3.3  --   HGB 10.5* 10.4*  HCT 30.0* 30.1*  MCV 91.7 90.1  PLT 100* 106*    Cardiac Enzymes: No results for input(s): CKTOTAL, CKMB, CKMBINDEX, TROPONINI in the last 168 hours.  BNP: Invalid input(s): POCBNP  CBG: No results for input(s): GLUCAP in the last 168 hours.  Microbiology: Results for orders placed or performed during the hospital encounter of 08/22/21  Resp Panel by RT-PCR (Flu A&B, Covid) Nasopharyngeal Swab     Status: None   Collection Time: 08/22/21  4:11 PM   Specimen: Nasopharyngeal Swab; Nasopharyngeal(NP) swabs in vial transport medium  Result Value Ref Range Status   SARS Coronavirus 2 by RT PCR NEGATIVE NEGATIVE Final     Comment: (NOTE) SARS-CoV-2 target nucleic acids are NOT DETECTED.  The SARS-CoV-2 RNA is generally detectable in upper respiratory specimens during the acute phase of infection. The lowest concentration of SARS-CoV-2 viral copies this assay can detect is 138 copies/mL. A negative result does not preclude SARS-Cov-2 infection and should not be used as the sole basis for treatment or other patient management decisions. A negative result may occur with  improper specimen collection/handling, submission of specimen other than nasopharyngeal swab, presence of viral mutation(s) within the areas targeted by this assay, and inadequate number of viral copies(<138 copies/mL). A negative result must be combined with clinical observations, patient history, and epidemiological information. The expected result is Negative.  Fact Sheet for Patients:  EntrepreneurPulse.com.au  Fact Sheet for Healthcare Providers:  IncredibleEmployment.be  This test is no t yet approved or cleared by the [place] FDA and  has been authorized for detection and/or diagnosis of SARS-CoV-2 by FDA under an Emergency Use Authorization (EUA). This EUA will remain  in effect (meaning this test can be used) for the duration of the COVID-19 declaration under Section 564(b)(1) of the Act, 21 U.S.C.section  360bbb-3(b)(1), unless the authorization is terminated  or revoked sooner.       Influenza A by PCR NEGATIVE NEGATIVE Final   Influenza B by PCR NEGATIVE NEGATIVE Final    Comment: (NOTE) The Xpert Xpress SARS-CoV-2/FLU/RSV plus assay is intended as an aid in the diagnosis of influenza from Nasopharyngeal swab specimens and should not be used as a sole basis for treatment. Nasal washings and aspirates are unacceptable for Xpert Xpress SARS-CoV-2/FLU/RSV testing.  Fact Sheet for Patients: EntrepreneurPulse.com.au  Fact Sheet for Healthcare  Providers: IncredibleEmployment.be  This test is not yet approved or cleared by the [place] FDA and has been authorized for detection and/or diagnosis of SARS-CoV-2 by FDA under an Emergency Use Authorization (EUA). This EUA will remain in effect (meaning this test can be used) for the duration of the COVID-19 declaration under Section 564(b)(1) of the Act, 21 U.S.C. section 360bbb-3(b)(1), unless the authorization is terminated or revoked.  Performed at [facility], [address] [place], [place] [address]     Coagulation Studies: No results for input(s): LABPROT, INR in the last 72 hours.  Urinalysis: No results for input(s): COLORURINE, LABSPEC, PHURINE, GLUCOSEU, HGBUR, BILIRUBINUR, KETONESUR, PROTEINUR, UROBILINOGEN, NITRITE, LEUKOCYTESUR in the last 72 hours.  Invalid input(s): APPERANCEUR    Imaging: US Renal  Result Date: 08/22/2021 CLINICAL DATA:  Anuria , history of metastatic prostate cancer EXAM: RENAL / URINARY TRACT ULTRASOUND COMPLETE COMPARISON:  03/03/2017, 07/31/2021 FINDINGS: Right Kidney: Renal measurements: 11.4 x 6.2 by 6.0 cm = volume: 220 mL. Grossly normal right renal cortical echotexture. There is a simple 2.6 cm right renal cyst again noted. There is mild right hydronephrosis unchanged since recent PET scan. Nonspecific 7 mm hyperechoic focus mid right kidney could reflect a cortical calcification. Left Kidney: Renal measurements: 11.2 x 8.0 x 7.3 cm = volume: 343 mL. Grossly normal left renal cortical echotexture. Mild left hydronephrosis unchanged since recent PET scan. No nephrolithiasis or renal mass. Bladder: Bladder is decompressed, and cannot be evaluated. Other: None. IMPRESSION: 1. Mild bilateral hydronephrosis, unchanged since recent PET scan. 2. Simple cyst right kidney. 3. Normal renal cortical echotexture. Electronically Signed   By: Randa Ngo M.D.   On: 08/22/2021 17:58     Assessment & Plan: Mr. RESHAUN BRISENO is a 71 y.o.  male with previous medical conditions including anemia, cirrhosis, hypertension, GERD, hypothyroidism, and prostate cancer, who was admitted to [facility] on 08/22/2021 for AKI (acute kidney injury) ([place]) [N17.9]  Acute kidney injury with uremic symptoms  likely secondary to dehydration, appears hypovolemic.  Reported uremic symptoms of confusion, [place] to no appetite due to food just tastes and drowsiness.  Baseline creatinine 1.61 with GFR 46 07/23/2021.  Ordered IV fluids with a bolus in the ED.  Bolus not given, but maintenance fluids given overnight.  No urine output collected and bladder scan continues to be 0 mL.  Discussed with patient and spouse the concern of lack of urine output along with uremic symptoms and proposed treatment plan of temporary dialysis.  Patient and wife are agreeable to this plan.  We will consult vascular surgery for placement of HD temp cath and initiate dialysis later today.  Next treatment scheduled for tomorrow.  We will continue to monitor renal function during further work-up.  2.  Metabolic acidosis likely due to kidney injury.  Currently receiving sodium bicarb at 125 mL/h.  3.  Prostate cancer with bone metastasis Home regimen includes prednisone and Zytiga     LOS:  [place] 2/17/20232:01 PM

## 2021-08-23 NOTE — Op Note (Signed)
°  OPERATIVE NOTE   PROCEDURE: Insertion of temporary dialysis catheter catheter right common femoral approach.  PRE-OPERATIVE DIAGNOSIS: Acute on chronic renal insufficiency  POST-OPERATIVE DIAGNOSIS: Same  SURGEON: Katha Cabal M.D.  ANESTHESIA: 1% lidocaine local infiltration  ESTIMATED BLOOD LOSS: Minimal cc  INDICATIONS:   Dylan Ho is a 71 y.o. male who presents with worsening renal function he would now require hemodialysis.  He is therefore undergoing a temporary catheter placement in anticipation of tunneled catheter placement in the near future.  Risks and benefits of been reviewed all questions answered patient has agreed to proceed.  DESCRIPTION: After obtaining full informed written consent, the patient was positioned supine. The right groin was prepped and draped in a sterile fashion. Ultrasound was placed in a sterile sleeve. Ultrasound was utilized to identify the right common femoral vein which is noted to be echolucent and compressible indicating patency. Images recorded for the permanent record. Under real-time visualization a Seldinger needle is inserted into the vein and the guidewires advanced without difficulty. Small counterincision was made at the wire insertion site. Dilator is passed over the wire and the temporary dialysis catheter catheter is fed over the wire without difficulty.  All lumens aspirate and flush easily and are packed with heparin saline. Catheter secured to the skin of the right thigh with 2-0 silk. A sterile dressing is applied with Biopatch.  COMPLICATIONS: None  CONDITION: Unchanged  Hortencia Pilar Office:  [phone] 08/23/2021, 5:28 PM

## 2021-08-23 NOTE — Progress Notes (Signed)
°   08/23/21 1405  Clinical Encounter Type  Visited With Patient and family together  Visit Type Initial;Spiritual support;Social support;Other (Comment) (discussed AD)  Spiritual Encounters  Spiritual Needs Other (Comment) (spiritual and social support)   Chaplain Burris met Pt and spouse for first time. They have been through several health challenges and have been coming to the [place] since 2021. This couple is resilient and display a good sense of humor but have clearly faced a lot and some support seems welcome. Pt gradually revealed that he felt this experience was God's way of "giving him a good testimony" and offered that he would like to talk further about spiritual matters. At this time Minette Brine, RN, needed to do some interventions so I offered that either myself or another chaplain would f/u to have further conversation and to offer support.

## 2021-08-23 NOTE — Progress Notes (Signed)
PROGRESS NOTE    Dylan Ho  XQJ:[id number] DOB: [birth year] DOA: 08/22/2021 PCP: Kirk Ruths, MD  [id number]   Assessment & Plan:   Principal Problem:   AKI (acute kidney injury) [facility])   Dylan Ho is a 71 y.o. male with medical history significant of prostate cancer with mets to bone, recent hernia surgery, hypothyroidism who presented from cancer center clinic with Cr of 16.88.   # AKI, POA --Cr 16.88 (baseline ~1.2), presumed from severe dehydration. --1L bolus ordered on admission but not given. Plan: --cont bicarb 1/2 NS@125  ml/hr --start temporary dialysis, per nephro   # Acute metabolic encephalopathy 2/2 # Uremia  --wife noted increased confusion though seems intermittent.  BUN 90. --start temporary dialysis, per nephro   # Metabolic acidosis --2/2 AKI --cont bicarb 1/2 NS@125  ml/hr   # Prostate cancer with mets to bone --cont home Zytiga --cont home prednisone   # Hypothyroidism --cont home Synthroid  # Hyperkalemia --due to AKI --Lokelma 5g BID --start temporary dialysis, per nephro   DVT prophylaxis: Heparin SQ Code Status: Full code  Family Communication: wife updated at bedside today  Level of care: Med-Surg Dispo:   The patient is from: home Anticipated d/c is to: home Anticipated d/c date is: >3 days Patient currently is not medically ready to d/c due to: AKI, starting temporary dialysis   Subjective and Interval History:  Dylan Ho reported drinking some fluids.  Made very Malcolm urine.    Nephro started Dylan Ho on temporary dialysis.   Objective: Vitals:   08/23/21 1732 08/23/21 1745 08/23/21 1749 08/23/21 1800  BP: (!) 171/78 (!) 125/110 (!) 165/95 (!) 169/90  Pulse: 68 77 73 76  Resp: 16 16 15 19   Temp:      TempSrc:      SpO2:      Weight:      Height:        Intake/Output Summary (Last 24 hours) at 08/23/2021 1815 Last data filed at 08/23/2021 1410 Gross per 24 hour  Intake 1399.29 ml  Output --  Net 1399.29  ml   Filed Weights   08/22/21 1609 08/23/21 1615  Weight: 102.1 kg 102 kg    Examination:   Constitutional: NAD, AAOx3 HEENT: conjunctivae and lids normal, EOMI CV: No cyanosis.   RESP: normal respiratory effort, on RA Extremities: No effusions, edema in BLE SKIN: warm, dry Neuro: II - XII grossly intact.   Psych: Normal mood and affect.  Appropriate judgement and reason   Data Reviewed: I have personally reviewed following labs and imaging studies  CBC: Recent Labs  Lab 08/22/21 1532 08/23/21 0530  WBC 4.2 4.3  NEUTROABS 3.3  --   HGB 10.5* 10.4*  HCT 30.0* 30.1*  MCV 91.7 90.1  PLT 100* 448*   Basic Metabolic Panel: Recent Labs  Lab 08/22/21 1315 08/23/21 0530  NA 127* 130*  K 4.6 5.6*  CL 93* 96*  CO2 17* 19*  GLUCOSE 92 87  BUN 90* 92*  CREATININE 16.88* 17.03*  CALCIUM 8.1* 8.0*  MG  --  2.1   GFR: Estimated Creatinine Clearance: 5 mL/min (A) (by C-G formula based on SCr of 17.03 mg/dL (H)). Liver Function Tests: Recent Labs  Lab 08/22/21 1315  AST 18  ALT 6  ALKPHOS 73  BILITOT 0.6  PROT 5.8*  ALBUMIN 3.3*   No results for input(s): LIPASE, AMYLASE in the last 168 hours. No results for input(s): AMMONIA in the last 168 hours. Coagulation Profile: No results  for input(s): INR, PROTIME in the last 168 hours. Cardiac Enzymes: No results for input(s): CKTOTAL, CKMB, CKMBINDEX, TROPONINI in the last 168 hours. BNP (last 3 results) No results for input(s): PROBNP in the last 8760 hours. HbA1C: No results for input(s): HGBA1C in the last 72 hours. CBG: No results for input(s): GLUCAP in the last 168 hours. Lipid Profile: No results for input(s): CHOL, HDL, LDLCALC, TRIG, CHOLHDL, LDLDIRECT in the last 72 hours. Thyroid Function Tests: No results for input(s): TSH, T4TOTAL, FREET4, T3FREE, THYROIDAB in the last 72 hours. Anemia Panel: No results for input(s): VITAMINB12, FOLATE, FERRITIN, TIBC, IRON, RETICCTPCT in the last 72 hours. Sepsis  Labs: No results for input(s): PROCALCITON, LATICACIDVEN in the last 168 hours.  Recent Results (from the past 240 hour(s))  Resp Panel by RT-PCR (Flu A&B, Covid) Nasopharyngeal Swab     Status: None   Collection Time: 08/22/21  4:11 PM   Specimen: Nasopharyngeal Swab; Nasopharyngeal(NP) swabs in vial transport medium  Result Value Ref Range Status   SARS Coronavirus 2 by RT PCR NEGATIVE NEGATIVE Final    Comment: (NOTE) SARS-CoV-2 target nucleic acids are NOT DETECTED.  The SARS-CoV-2 RNA is generally detectable in upper respiratory specimens during the acute phase of infection. The lowest concentration of SARS-CoV-2 viral copies this assay can detect is 138 copies/mL. A negative result does not preclude SARS-Cov-2 infection and should not be used as the sole basis for treatment or other patient management decisions. A negative result may occur with  improper specimen collection/handling, submission of specimen other than nasopharyngeal swab, presence of viral mutation(s) within the areas targeted by this assay, and inadequate number of viral copies(<138 copies/mL). A negative result must be combined with clinical observations, patient history, and epidemiological information. The expected result is Negative.  Fact Sheet for Patients:  EntrepreneurPulse.com.au  Fact Sheet for Healthcare Providers:  IncredibleEmployment.be  This test is no t yet approved or cleared by the [place] FDA and  has been authorized for detection and/or diagnosis of SARS-CoV-2 by FDA under an Emergency Use Authorization (EUA). This EUA will remain  in effect (meaning this test can be used) for the duration of the COVID-19 declaration under Section 564(b)(1) of the Act, 21 U.S.C.section 360bbb-3(b)(1), unless the authorization is terminated  or revoked sooner.       Influenza A by PCR NEGATIVE NEGATIVE Final   Influenza B by PCR NEGATIVE NEGATIVE Final     Comment: (NOTE) The Xpert Xpress SARS-CoV-2/FLU/RSV plus assay is intended as an aid in the diagnosis of influenza from Nasopharyngeal swab specimens and should not be used as a sole basis for treatment. Nasal washings and aspirates are unacceptable for Xpert Xpress SARS-CoV-2/FLU/RSV testing.  Fact Sheet for Patients: EntrepreneurPulse.com.au  Fact Sheet for Healthcare Providers: IncredibleEmployment.be  This test is not yet approved or cleared by the [place] FDA and has been authorized for detection and/or diagnosis of SARS-CoV-2 by FDA under an Emergency Use Authorization (EUA). This EUA will remain in effect (meaning this test can be used) for the duration of the COVID-19 declaration under Section 564(b)(1) of the Act, 21 U.S.C. section 360bbb-3(b)(1), unless the authorization is terminated or revoked.  Performed at [facility], [address], [place], [place] [address]       Radiology Studies: US Renal  Result Date: 08/22/2021 CLINICAL DATA:  Anuria , history of metastatic prostate cancer EXAM: RENAL / URINARY TRACT ULTRASOUND COMPLETE COMPARISON:  03/03/2017, 07/31/2021 FINDINGS: Right Kidney: Renal measurements: 11.4 x 6.2  by 6.0 cm = volume: 220 mL. Grossly normal right renal cortical echotexture. There is a simple 2.6 cm right renal cyst again noted. There is mild right hydronephrosis unchanged since recent PET scan. Nonspecific 7 mm hyperechoic focus mid right kidney could reflect a cortical calcification. Left Kidney: Renal measurements: 11.2 x 8.0 x 7.3 cm = volume: 343 mL. Grossly normal left renal cortical echotexture. Mild left hydronephrosis unchanged since recent PET scan. No nephrolithiasis or renal mass. Bladder: Bladder is decompressed, and cannot be evaluated. Other: None. IMPRESSION: 1. Mild bilateral hydronephrosis, unchanged since recent PET scan. 2. Simple cyst right kidney. 3. Normal renal cortical  echotexture. Electronically Signed   By: Randa Ngo M.D.   On: 08/22/2021 17:58   PERIPHERAL VASCULAR CATHETERIZATION  Result Date: 08/23/2021 See surgical note for result.    Scheduled Meds:  abiraterone acetate  1,000 mg Oral Daily   Chlorhexidine Gluconate Cloth  6 each Topical Daily   darifenacin  15 mg Oral Daily   heparin  5,000 Units Subcutaneous Q8H   heparin sodium (porcine)       levothyroxine  200 mcg Oral Q0600   predniSONE  5 mg Oral BID WC   sodium zirconium cyclosilicate  5 g Oral BID   Continuous Infusions:  sodium chloride     sodium chloride     lactated ringers     sodium bicarbonate in 0.45 NS mL infusion 125 mL/hr at 08/23/21 1148     LOS: 1 day     Enzo Bi, MD [facility] If 7PM-7AM, please contact night-coverage 08/23/2021, 6:15 PM

## 2021-08-23 NOTE — Interval H&P Note (Signed)
History and Physical Interval Note:  08/23/2021 3:11 PM  Dylan Ho  has presented today for surgery, with the diagnosis of ESRD.  The various methods of treatment have been discussed with the patient and family. After consideration of risks, benefits and other options for treatment, the patient has consented to  Procedure(s): TEMPORARY DIALYSIS CATHETER (N/A) as a surgical intervention.  The patient's history has been reviewed, patient examined, no change in status, stable for surgery.  I have reviewed the patient's chart and labs.  Questions were answered to the patient's satisfaction.     Hortencia Pilar

## 2021-08-24 LAB — CBC
HCT: 29.3 % — ABNORMAL LOW (ref 39.0–52.0)
Hemoglobin: 10 g/dL — ABNORMAL LOW (ref 13.0–17.0)
MCH: 30.8 pg (ref 26.0–34.0)
MCHC: 34.1 g/dL (ref 30.0–36.0)
MCV: 90.2 fL (ref 80.0–100.0)
Platelets: 110 10*3/uL — ABNORMAL LOW (ref 150–400)
RBC: 3.25 MIL/uL — ABNORMAL LOW (ref 4.22–5.81)
RDW: 13.2 % (ref 11.5–15.5)
WBC: 4.5 10*3/uL (ref 4.0–10.5)
nRBC: 0 % (ref 0.0–0.2)

## 2021-08-24 LAB — BASIC METABOLIC PANEL
Anion gap: 13 (ref 5–15)
BUN: 64 mg/dL — ABNORMAL HIGH (ref 8–23)
CO2: 25 mmol/L (ref 22–32)
Calcium: 8 mg/dL — ABNORMAL LOW (ref 8.9–10.3)
Chloride: 93 mmol/L — ABNORMAL LOW (ref 98–111)
Creatinine, Ser: 12.87 mg/dL — ABNORMAL HIGH (ref 0.61–1.24)
GFR, Estimated: 4 mL/min — ABNORMAL LOW (ref >60)
Glucose, Bld: 79 mg/dL (ref 70–99)
Potassium: 4.6 mmol/L (ref 3.5–5.1)
Sodium: 131 mmol/L — ABNORMAL LOW (ref 135–145)

## 2021-08-24 LAB — HEPATITIS B SURFACE ANTIBODY, QUANTITATIVE: Hep B S AB Quant (Post): <3.1 m[IU]/mL — ABNORMAL LOW (ref >9.9)

## 2021-08-24 LAB — MAGNESIUM: Magnesium: 1.9 mg/dL (ref 1.7–2.4)

## 2021-08-24 MED ORDER — LACTATED RINGERS IV SOLN: INTRAVENOUS | Status: DC

## 2021-08-24 MED ORDER — POLYETHYLENE GLYCOL 3350 17 G PO PACK
34.0000 g | PACK | Freq: Two times a day (BID) | ORAL | Status: DC
  Administered 2021-08-24 – 2021-08-25 (×3): 34 g via ORAL
  Filled 2021-08-24 (×4): qty 2

## 2021-08-24 MED ORDER — DOCUSATE SODIUM 100 MG PO CAPS
100.0000 mg | ORAL_CAPSULE | Freq: Two times a day (BID) | ORAL | Status: DC
  Administered 2021-08-24 – 2021-08-28 (×7): 100 mg via ORAL
  Filled 2021-08-24 (×8): qty 1

## 2021-08-24 MED ORDER — HEPARIN SODIUM (PORCINE) 1000 UNIT/ML IJ SOLN
INTRAMUSCULAR | Status: AC
  Filled 2021-08-24: qty 10

## 2021-08-24 MED ORDER — SENNOSIDES-DOCUSATE SODIUM 8.6-50 MG PO TABS
1.0000 | ORAL_TABLET | Freq: Two times a day (BID) | ORAL | Status: DC
  Administered 2021-08-24 – 2021-08-28 (×7): 1 via ORAL
  Filled 2021-08-24 (×8): qty 1

## 2021-08-24 MED ORDER — TRAZODONE HCL 50 MG PO TABS
150.0000 mg | ORAL_TABLET | Freq: Every day | ORAL | Status: DC
  Administered 2021-08-24 – 2021-08-27 (×4): 150 mg via ORAL
  Filled 2021-08-24 (×4): qty 1

## 2021-08-24 MED ORDER — POLYETHYLENE GLYCOL 3350 17 G PO PACK: 17.0000 g | PACK | Freq: Two times a day (BID) | ORAL | Status: DC

## 2021-08-24 MED ORDER — BISACODYL 10 MG RE SUPP
10.0000 mg | Freq: Every day | RECTAL | Status: DC | PRN
  Administered 2021-08-24: 14:00:00 10 mg via RECTAL
  Filled 2021-08-24: qty 1

## 2021-08-24 NOTE — Progress Notes (Signed)
Cross Cover Patient request his home regimen night time sleep aid trazodone 450 mg. Home dose changed to 150 given acute renal dysfunction and metabolic acidosis

## 2021-08-24 NOTE — Progress Notes (Signed)
[facility]  ROUNDING NOTE   Subjective:   Patient got admitted with acute kidney injury likely secondary to dehydration/hypovolemia.  Patient also reported to have uremic symptoms with the confusion poor appetite and drowsiness.  He was placed on urgent hemodialysis due to worsening renal status with elevated renal markers and azotemia. Temporary hemodialysis catheter in place.    Objective:  Vital signs in last 24 hours:  Temp:  [98.1 F (36.7 C)-99 F (37.2 C)] 98.5 F (36.9 C) (02/18 1014) Pulse Rate:  [65-81] 74 (02/18 1259) Resp:  [0-22] 22 (02/18 1259) BP: (125-180)/(70-110) 157/72 (02/18 1259) SpO2:  [94 %-99 %] 96 % (02/18 1259) Weight:  [101.6 kg-103.4 kg] 102.6 kg (02/18 1252)  Weight change: -0.059 kg Filed Weights   08/23/21 1830 08/24/21 1014 08/24/21 1252  Weight: 101.6 kg 103.4 kg 102.6 kg    Intake/Output: I/O last 3 completed shifts: In: 4171 [P.O.:540; I.V.:3631] Out: 0    Intake/Output this shift:  Total I/O In: 536.3 [I.V.:536.3] Out: 1000 [Other:1000]  Physical Exam: General: Awake with intermittent confusion.   Head: Normocephalic, atraumatic. Moist oral mucosal membranes  Eyes: Anicteric  Neck: Supple  Lungs:  Clear to auscultation, normal effort  Heart: S1S2 no rubs  Abdomen:  Soft, nontender, bowel sounds present  Extremities:  peripheral edema.  Neurologic: Awake, alert, following commands  Skin: No acute rash  Access: Temporary femoral HD catheter    Basic Metabolic Panel: Recent Labs  Lab 08/22/21 1315 08/23/21 0530 08/24/21 0351  NA 127* 130* 131*  K 4.6 5.6* 4.6  CL 93* 96* 93*  CO2 17* 19* 25  GLUCOSE 92 87 79  BUN 90* 92* 64*  CREATININE 16.88* 17.03* 12.87*  CALCIUM 8.1* 8.0* 8.0*  MG  --  2.1 1.9    Liver Function Tests: Recent Labs  Lab 08/22/21 1315  AST 18  ALT 6  ALKPHOS 73  BILITOT 0.6  PROT 5.8*  ALBUMIN 3.3*   No results for input(s): LIPASE, AMYLASE in the last 168 hours. No results  for input(s): AMMONIA in the last 168 hours.  CBC: Recent Labs  Lab 08/22/21 1532 08/23/21 0530 08/24/21 0351  WBC 4.2 4.3 4.5  NEUTROABS 3.3  --   --   HGB 10.5* 10.4* 10.0*  HCT 30.0* 30.1* 29.3*  MCV 91.7 90.1 90.2  PLT 100* 106* 110*    Cardiac Enzymes: No results for input(s): CKTOTAL, CKMB, CKMBINDEX, TROPONINI in the last 168 hours.  BNP: Invalid input(s): POCBNP  CBG: No results for input(s): GLUCAP in the last 168 hours.  Microbiology: Results for orders placed or performed during the hospital encounter of 08/22/21  Resp Panel by RT-PCR (Flu A&B, Covid) Nasopharyngeal Swab     Status: None   Collection Time: 08/22/21  4:11 PM   Specimen: Nasopharyngeal Swab; Nasopharyngeal(NP) swabs in vial transport medium  Result Value Ref Range Status   SARS Coronavirus 2 by RT PCR NEGATIVE NEGATIVE Final    Comment: (NOTE) SARS-CoV-2 target nucleic acids are NOT DETECTED.  The SARS-CoV-2 RNA is generally detectable in upper respiratory specimens during the acute phase of infection. The lowest concentration of SARS-CoV-2 viral copies this assay can detect is 138 copies/mL. A negative result does not preclude SARS-Cov-2 infection and should not be used as the sole basis for treatment or other patient management decisions. A negative result may occur with  improper specimen collection/handling, submission of specimen other than nasopharyngeal swab, presence of viral mutation(s) within the areas targeted by this  assay, and inadequate number of viral copies(<138 copies/mL). A negative result must be combined with clinical observations, patient history, and epidemiological information. The expected result is Negative.  Fact Sheet for Patients:  EntrepreneurPulse.com.au  Fact Sheet for Healthcare Providers:  IncredibleEmployment.be  This test is no t yet approved or cleared by the [place] FDA and  has been authorized for detection  and/or diagnosis of SARS-CoV-2 by FDA under an Emergency Use Authorization (EUA). This EUA will remain  in effect (meaning this test can be used) for the duration of the COVID-19 declaration under Section 564(b)(1) of the Act, 21 U.S.C.section 360bbb-3(b)(1), unless the authorization is terminated  or revoked sooner.       Influenza A by PCR NEGATIVE NEGATIVE Final   Influenza B by PCR NEGATIVE NEGATIVE Final    Comment: (NOTE) The Xpert Xpress SARS-CoV-2/FLU/RSV plus assay is intended as an aid in the diagnosis of influenza from Nasopharyngeal swab specimens and should not be used as a sole basis for treatment. Nasal washings and aspirates are unacceptable for Xpert Xpress SARS-CoV-2/FLU/RSV testing.  Fact Sheet for Patients: EntrepreneurPulse.com.au  Fact Sheet for Healthcare Providers: IncredibleEmployment.be  This test is not yet approved or cleared by the [place] FDA and has been authorized for detection and/or diagnosis of SARS-CoV-2 by FDA under an Emergency Use Authorization (EUA). This EUA will remain in effect (meaning this test can be used) for the duration of the COVID-19 declaration under Section 564(b)(1) of the Act, 21 U.S.C. section 360bbb-3(b)(1), unless the authorization is terminated or revoked.  Performed at [facility], [address] [place], [place] [address]     Coagulation Studies: No results for input(s): LABPROT, INR in the last 72 hours.  Urinalysis: No results for input(s): COLORURINE, LABSPEC, PHURINE, GLUCOSEU, HGBUR, BILIRUBINUR, KETONESUR, PROTEINUR, UROBILINOGEN, NITRITE, LEUKOCYTESUR in the last 72 hours.  Invalid input(s): APPERANCEUR    Imaging: US Renal  Result Date: 08/22/2021 CLINICAL DATA:  Anuria , history of metastatic prostate cancer EXAM: RENAL / URINARY TRACT ULTRASOUND COMPLETE COMPARISON:  03/03/2017, 07/31/2021 FINDINGS: Right Kidney: Renal measurements: 11.4 x 6.2 by  6.0 cm = volume: 220 mL. Grossly normal right renal cortical echotexture. There is a simple 2.6 cm right renal cyst again noted. There is mild right hydronephrosis unchanged since recent PET scan. Nonspecific 7 mm hyperechoic focus mid right kidney could reflect a cortical calcification. Left Kidney: Renal measurements: 11.2 x 8.0 x 7.3 cm = volume: 343 mL. Grossly normal left renal cortical echotexture. Mild left hydronephrosis unchanged since recent PET scan. No nephrolithiasis or renal mass. Bladder: Bladder is decompressed, and cannot be evaluated. Other: None. IMPRESSION: 1. Mild bilateral hydronephrosis, unchanged since recent PET scan. 2. Simple cyst right kidney. 3. Normal renal cortical echotexture. Electronically Signed   By: Randa Ngo M.D.   On: 08/22/2021 17:58   PERIPHERAL VASCULAR CATHETERIZATION  Result Date: 08/23/2021 See surgical note for result.    Medications:    lactated ringers 75 mL/hr at 08/24/21 1420    abiraterone acetate  1,000 mg Oral Daily   Chlorhexidine Gluconate Cloth  6 each Topical Daily   darifenacin  15 mg Oral Daily   docusate sodium  100 mg Oral BID   heparin  5,000 Units Subcutaneous Q8H   heparin sodium (porcine)       levothyroxine  200 mcg Oral Q0600   predniSONE  5 mg Oral BID WC   senna-docusate  1 tablet Oral BID   bisacodyl, oxyCODONE  Assessment/ Plan:  71 y.o. male with past medical history of anemia, cirrhosis,hypertension,GERD, hypothyroidism and prostate cancer admitted to [facility] due to acute kidney injury.  Due to worsening renal status with markedly elevated creatinine level and oliguria patient was placed on hemodialysis.   Acute kidney injury with uremic symptoms likely secondary to dehydration/hypovolemia.  Patient was placed on hemodialysis due to worsening renal markers and anuria.  Currently with a temporary hemodialysis catheter.  Patient received first hemodialysis yesterday and receiving another treatment today.  Monitor renal  function closely.   Metabolic acidosis likely secondary to acute kidney injury. S/P sodium bicarb drip bicarb level improved to 25.   Prostate cancer with bone metastasis.  Continue home continue home regimen including prednisone and Zytiga.      LOS: 2 Deroy Noah P Jefte Carithers 2/18/20233:29 PM

## 2021-08-24 NOTE — Progress Notes (Signed)
PROGRESS NOTE    Dylan Ho  ZOX:[id number] DOB: Sep 04, 1950 DOA: 08/22/2021 PCP: Kirk Ruths, MD  [phone]   Assessment & Plan:   Principal Problem:   AKI (acute kidney injury) [facility])   Dylan Ho is a 71 y.o. male with medical history significant of prostate cancer with mets to bone, recent hernia surgery, hypothyroidism who presented from cancer center clinic with Cr of 16.88.   # AKI, POA --Cr 16.88 (baseline ~1.2), presumed from severe dehydration. --1L bolus ordered on admission but not given. --started on temp dialysis on 2/17 Plan: --reduce MIVF to 75 ml/hr --iHD per nephrology --monitor urine output   # Acute metabolic encephalopathy 2/2 # Uremia  --wife noted increased confusion though seems intermittent.  BUN 90.  Also had change in sensation of taste. --started temporary dialysis with improvement in symptoms.   # Metabolic acidosis, resolved --2/2 AKI --d/c bicarb gtt and switch to LR    # Prostate cancer with mets to bone --cont home Zytiga --cont home prednisone   # Hypothyroidism --cont home Synthroid  # Hyperkalemia, resolved --due to AKI --s/p Lokelma 5g x2 doses --dialysis for potassium removal   DVT prophylaxis: Heparin SQ Code Status: Full code  Family Communication: wife updated at bedside today  Level of care: Med-Surg Dispo:   The patient is from: home Anticipated d/c is to: home Anticipated d/c date is: >3 days Patient currently is not medically ready to d/c due to: AKI, started temporary dialysis   Subjective and Interval History:  Dylan Ho had 2nd session of dialysis today, tolerated it well.  Dylan Ho reported tastes returning, and ate better.  Still not much urine output.   Objective: Vitals:   08/24/21 1245 08/24/21 1252 08/24/21 1259 08/24/21 1625  BP: (!) 159/76 (!) 157/72 (!) 157/72 (!) 152/79  Pulse: 77 73 74 73  Resp: 19 18 (!) 22 20  Temp:    99.5 F (37.5 C)  TempSrc:    Oral  SpO2: 96% 95% 96% 94%   Weight:  102.6 kg    Height:        Intake/Output Summary (Last 24 hours) at 08/24/2021 1800 Last data filed at 08/24/2021 1500 Gross per 24 hour  Intake 3308.01 ml  Output 1000 ml  Net 2308.01 ml   Filed Weights   08/23/21 1830 08/24/21 1014 08/24/21 1252  Weight: 101.6 kg 103.4 kg 102.6 kg    Examination:   Constitutional: NAD, AAOx3 HEENT: conjunctivae and lids normal, EOMI CV: No cyanosis.   RESP: normal respiratory effort, on RA Extremities: No effusions, edema in BLE SKIN: warm, dry Neuro: II - XII grossly intact.   Psych: Normal mood and affect.  Appropriate judgement and reason   Data Reviewed: I have personally reviewed following labs and imaging studies  CBC: Recent Labs  Lab 08/22/21 1532 08/23/21 0530 08/24/21 0351  WBC 4.2 4.3 4.5  NEUTROABS 3.3  --   --   HGB 10.5* 10.4* 10.0*  HCT 30.0* 30.1* 29.3*  MCV 91.7 90.1 90.2  PLT 100* 106* 562*   Basic Metabolic Panel: Recent Labs  Lab 08/22/21 1315 08/23/21 0530 08/24/21 0351  NA 127* 130* 131*  K 4.6 5.6* 4.6  CL 93* 96* 93*  CO2 17* 19* 25  GLUCOSE 92 87 79  BUN 90* 92* 64*  CREATININE 16.88* 17.03* 12.87*  CALCIUM 8.1* 8.0* 8.0*  MG  --  2.1 1.9   GFR: Estimated Creatinine Clearance: 6.7 mL/min (A) (by C-G formula based on  SCr of 12.87 mg/dL (H)). Liver Function Tests: Recent Labs  Lab 08/22/21 1315  AST 18  ALT 6  ALKPHOS 73  BILITOT 0.6  PROT 5.8*  ALBUMIN 3.3*   No results for input(s): LIPASE, AMYLASE in the last 168 hours. No results for input(s): AMMONIA in the last 168 hours. Coagulation Profile: No results for input(s): INR, PROTIME in the last 168 hours. Cardiac Enzymes: No results for input(s): CKTOTAL, CKMB, CKMBINDEX, TROPONINI in the last 168 hours. BNP (last 3 results) No results for input(s): PROBNP in the last 8760 hours. HbA1C: No results for input(s): HGBA1C in the last 72 hours. CBG: No results for input(s): GLUCAP in the last 168 hours. Lipid  Profile: No results for input(s): CHOL, HDL, LDLCALC, TRIG, CHOLHDL, LDLDIRECT in the last 72 hours. Thyroid Function Tests: No results for input(s): TSH, T4TOTAL, FREET4, T3FREE, THYROIDAB in the last 72 hours. Anemia Panel: No results for input(s): VITAMINB12, FOLATE, FERRITIN, TIBC, IRON, RETICCTPCT in the last 72 hours. Sepsis Labs: No results for input(s): PROCALCITON, LATICACIDVEN in the last 168 hours.  Recent Results (from the past 240 hour(s))  Resp Panel by RT-PCR (Flu A&B, Covid) Nasopharyngeal Swab     Status: None   Collection Time: 08/22/21  4:11 PM   Specimen: Nasopharyngeal Swab; Nasopharyngeal(NP) swabs in vial transport medium  Result Value Ref Range Status   SARS Coronavirus 2 by RT PCR NEGATIVE NEGATIVE Final    Comment: (NOTE) SARS-CoV-2 target nucleic acids are NOT DETECTED.  The SARS-CoV-2 RNA is generally detectable in upper respiratory specimens during the acute phase of infection. The lowest concentration of SARS-CoV-2 viral copies this assay can detect is 138 copies/mL. A negative result does not preclude SARS-Cov-2 infection and should not be used as the sole basis for treatment or other patient management decisions. A negative result may occur with  improper specimen collection/handling, submission of specimen other than nasopharyngeal swab, presence of viral mutation(s) within the areas targeted by this assay, and inadequate number of viral copies(<138 copies/mL). A negative result must be combined with clinical observations, patient history, and epidemiological information. The expected result is Negative.  Fact Sheet for Patients:  EntrepreneurPulse.com.au  Fact Sheet for Healthcare Providers:  IncredibleEmployment.be  This test is no t yet approved or cleared by the [place] FDA and  has been authorized for detection and/or diagnosis of SARS-CoV-2 by FDA under an Emergency Use Authorization (EUA). This EUA  will remain  in effect (meaning this test can be used) for the duration of the COVID-19 declaration under Section 564(b)(1) of the Act, 21 U.S.C.section 360bbb-3(b)(1), unless the authorization is terminated  or revoked sooner.       Influenza A by PCR NEGATIVE NEGATIVE Final   Influenza B by PCR NEGATIVE NEGATIVE Final    Comment: (NOTE) The Xpert Xpress SARS-CoV-2/FLU/RSV plus assay is intended as an aid in the diagnosis of influenza from Nasopharyngeal swab specimens and should not be used as a sole basis for treatment. Nasal washings and aspirates are unacceptable for Xpert Xpress SARS-CoV-2/FLU/RSV testing.  Fact Sheet for Patients: EntrepreneurPulse.com.au  Fact Sheet for Healthcare Providers: IncredibleEmployment.be  This test is not yet approved or cleared by the [place] FDA and has been authorized for detection and/or diagnosis of SARS-CoV-2 by FDA under an Emergency Use Authorization (EUA). This EUA will remain in effect (meaning this test can be used) for the duration of the COVID-19 declaration under Section 564(b)(1) of the Act, 21 U.S.C. section 360bbb-3(b)(1), unless the authorization is  terminated or revoked.  Performed at [facility], [address] [place], [place] [address]       Radiology Studies: PERIPHERAL VASCULAR CATHETERIZATION  Result Date: 08/23/2021 See surgical note for result.    Scheduled Meds:  abiraterone acetate  1,000 mg Oral Daily   Chlorhexidine Gluconate Cloth  6 each Topical Daily   darifenacin  15 mg Oral Daily   docusate sodium  100 mg Oral BID   heparin  5,000 Units Subcutaneous Q8H   levothyroxine  200 mcg Oral Q0600   polyethylene glycol  34 g Oral BID   predniSONE  5 mg Oral BID WC   senna-docusate  1 tablet Oral BID   Continuous Infusions:  lactated ringers 75 mL/hr at 08/24/21 1420     LOS: 2 days     Enzo Bi, MD [facility] If 7PM-7AM, please  contact night-coverage 08/24/2021, 6:00 PM

## 2021-08-25 LAB — BASIC METABOLIC PANEL
Anion gap: 13 (ref 5–15)
BUN: 42 mg/dL — ABNORMAL HIGH (ref 8–23)
CO2: 25 mmol/L (ref 22–32)
Calcium: 8 mg/dL — ABNORMAL LOW (ref 8.9–10.3)
Chloride: 95 mmol/L — ABNORMAL LOW (ref 98–111)
Creatinine, Ser: 9.88 mg/dL — ABNORMAL HIGH (ref 0.61–1.24)
GFR, Estimated: 5 mL/min — ABNORMAL LOW (ref >60)
Glucose, Bld: 84 mg/dL (ref 70–99)
Potassium: 4.1 mmol/L (ref 3.5–5.1)
Sodium: 133 mmol/L — ABNORMAL LOW (ref 135–145)

## 2021-08-25 LAB — CBC
HCT: 25.6 % — ABNORMAL LOW (ref 39.0–52.0)
Hemoglobin: 8.8 g/dL — ABNORMAL LOW (ref 13.0–17.0)
MCH: 31.2 pg (ref 26.0–34.0)
MCHC: 34.4 g/dL (ref 30.0–36.0)
MCV: 90.8 fL (ref 80.0–100.0)
Platelets: 96 10*3/uL — ABNORMAL LOW (ref 150–400)
RBC: 2.82 MIL/uL — ABNORMAL LOW (ref 4.22–5.81)
RDW: 13.3 % (ref 11.5–15.5)
WBC: 3.6 10*3/uL — ABNORMAL LOW (ref 4.0–10.5)
nRBC: 0 % (ref 0.0–0.2)

## 2021-08-25 LAB — MAGNESIUM: Magnesium: 1.8 mg/dL (ref 1.7–2.4)

## 2021-08-25 MED ORDER — HYDRALAZINE HCL 25 MG PO TABS
25.0000 mg | ORAL_TABLET | Freq: Three times a day (TID) | ORAL | Status: DC
  Administered 2021-08-25 – 2021-08-27 (×7): 25 mg via ORAL
  Filled 2021-08-25 (×7): qty 1

## 2021-08-25 NOTE — Progress Notes (Addendum)
PROGRESS NOTE    Dylan Ho  DZH:[id number] DOB: July 14, 1950 DOA: 08/22/2021 PCP: Kirk Ruths, MD  [phone]   Assessment & Plan:   Principal Problem:   AKI (acute kidney injury) [facility])   Dylan Ho is a 71 y.o. male with medical history significant of prostate cancer with mets to bone, recent hernia surgery, hypothyroidism who presented from cancer center clinic with Cr of 16.88.   # AKI, POA --Cr 16.88 (baseline ~1.2), presumed from severe dehydration. --1L bolus ordered on admission but not given. --started on temp dialysis on 2/17 Plan: --reduce MIVF to 50 ml/hr --iHD per nephrology --monitor urine output --planning to have permacath placement Tuesday for hemodialysis as outpatient.    # Acute metabolic encephalopathy 2/2 # Uremia  --wife noted increased confusion though seems intermittent.  BUN 90.  Also had change in sensation of taste. --started temporary dialysis with improvement in symptoms.   # Metabolic acidosis, resolved --2/2 AKI --d/c'ed bicarb gtt after bicarb level normalized   # Prostate cancer with mets to bone --cont home Zytiga --cont home prednisone   # Hypothyroidism --cont home synthroid  # Hyperkalemia, resolved --due to AKI --s/p Lokelma 5g x2 doses --dialysis for potassium removal  # HTN --no BP meds on home med list.  BP has been elevated --start hydralazine 25 mg q8h   DVT prophylaxis: Heparin SQ Code Status: Full code  Family Communication: wife updated at bedside today  Level of care: Med-Surg Dispo:   The patient is from: home Anticipated d/c is to: home Anticipated d/c date is: possible on Tuesday Patient currently is not medically ready to d/c due to: AKI, started temporary dialysis   Subjective and Interval History:  Pt reported doing better.  Better appetite.  No BM yet, but doesn't want aggressive BR.   Objective: Vitals:   08/24/21 2117 08/25/21 0450 08/25/21 0755 08/25/21 1600  BP: (!)  183/85 (!) 163/76 (!) 145/67 (!) 170/75  Pulse: 75 69 66 77  Resp: 18 16 18 18   Temp: 98.4 F (36.9 C) 98 F (36.7 C) 98.5 F (36.9 C) 98.2 F (36.8 C)  TempSrc: Oral Oral Oral Oral  SpO2: 97% 96% 95% 96%  Weight:      Height:       No intake or output data in the 24 hours ending 08/25/21 1946  Filed Weights   08/23/21 1830 08/24/21 1014 08/24/21 1252  Weight: 101.6 kg 103.4 kg 102.6 kg    Examination:   Constitutional: NAD, AAOx3 HEENT: conjunctivae and lids normal, EOMI CV: No cyanosis.   RESP: normal respiratory effort, on RA Extremities: No effusions, edema in BLE SKIN: warm, dry Neuro: II - XII grossly intact.   Psych: Normal mood and affect.  Appropriate judgement and reason   Data Reviewed: I have personally reviewed following labs and imaging studies  CBC: Recent Labs  Lab 08/22/21 1532 08/23/21 0530 08/24/21 0351 08/25/21 0645  WBC 4.2 4.3 4.5 3.6*  NEUTROABS 3.3  --   --   --   HGB 10.5* 10.4* 10.0* 8.8*  HCT 30.0* 30.1* 29.3* 25.6*  MCV 91.7 90.1 90.2 90.8  PLT 100* 106* 110* 96*   Basic Metabolic Panel: Recent Labs  Lab 08/22/21 1315 08/23/21 0530 08/24/21 0351 08/25/21 0645  NA 127* 130* 131* 133*  K 4.6 5.6* 4.6 4.1  CL 93* 96* 93* 95*  CO2 17* 19* 25 25  GLUCOSE 92 87 79 84  BUN 90* 92* 64* 42*  CREATININE 16.88*  17.03* 12.87* 9.88*  CALCIUM 8.1* 8.0* 8.0* 8.0*  MG  --  2.1 1.9 1.8   GFR: Estimated Creatinine Clearance: 8.7 mL/min (A) (by C-G formula based on SCr of 9.88 mg/dL (H)). Liver Function Tests: Recent Labs  Lab 08/22/21 1315  AST 18  ALT 6  ALKPHOS 73  BILITOT 0.6  PROT 5.8*  ALBUMIN 3.3*   No results for input(s): LIPASE, AMYLASE in the last 168 hours. No results for input(s): AMMONIA in the last 168 hours. Coagulation Profile: No results for input(s): INR, PROTIME in the last 168 hours. Cardiac Enzymes: No results for input(s): CKTOTAL, CKMB, CKMBINDEX, TROPONINI in the last 168 hours. BNP (last 3  results) No results for input(s): PROBNP in the last 8760 hours. HbA1C: No results for input(s): HGBA1C in the last 72 hours. CBG: No results for input(s): GLUCAP in the last 168 hours. Lipid Profile: No results for input(s): CHOL, HDL, LDLCALC, TRIG, CHOLHDL, LDLDIRECT in the last 72 hours. Thyroid Function Tests: No results for input(s): TSH, T4TOTAL, FREET4, T3FREE, THYROIDAB in the last 72 hours. Anemia Panel: No results for input(s): VITAMINB12, FOLATE, FERRITIN, TIBC, IRON, RETICCTPCT in the last 72 hours. Sepsis Labs: No results for input(s): PROCALCITON, LATICACIDVEN in the last 168 hours.  Recent Results (from the past 240 hour(s))  Resp Panel by RT-PCR (Flu A&B, Covid) Nasopharyngeal Swab     Status: None   Collection Time: 08/22/21  4:11 PM   Specimen: Nasopharyngeal Swab; Nasopharyngeal(NP) swabs in vial transport medium  Result Value Ref Range Status   SARS Coronavirus 2 by RT PCR NEGATIVE NEGATIVE Final    Comment: (NOTE) SARS-CoV-2 target nucleic acids are NOT DETECTED.  The SARS-CoV-2 RNA is generally detectable in upper respiratory specimens during the acute phase of infection. The lowest concentration of SARS-CoV-2 viral copies this assay can detect is 138 copies/mL. A negative result does not preclude SARS-Cov-2 infection and should not be used as the sole basis for treatment or other patient management decisions. A negative result may occur with  improper specimen collection/handling, submission of specimen other than nasopharyngeal swab, presence of viral mutation(s) within the areas targeted by this assay, and inadequate number of viral copies(<138 copies/mL). A negative result must be combined with clinical observations, patient history, and epidemiological information. The expected result is Negative.  Fact Sheet for Patients:  EntrepreneurPulse.com.au  Fact Sheet for Healthcare Providers:   IncredibleEmployment.be  This test is no t yet approved or cleared by the [place] FDA and  has been authorized for detection and/or diagnosis of SARS-CoV-2 by FDA under an Emergency Use Authorization (EUA). This EUA will remain  in effect (meaning this test can be used) for the duration of the COVID-19 declaration under Section 564(b)(1) of the Act, 21 U.S.C.section 360bbb-3(b)(1), unless the authorization is terminated  or revoked sooner.       Influenza A by PCR NEGATIVE NEGATIVE Final   Influenza B by PCR NEGATIVE NEGATIVE Final    Comment: (NOTE) The Xpert Xpress SARS-CoV-2/FLU/RSV plus assay is intended as an aid in the diagnosis of influenza from Nasopharyngeal swab specimens and should not be used as a sole basis for treatment. Nasal washings and aspirates are unacceptable for Xpert Xpress SARS-CoV-2/FLU/RSV testing.  Fact Sheet for Patients: EntrepreneurPulse.com.au  Fact Sheet for Healthcare Providers: IncredibleEmployment.be  This test is not yet approved or cleared by the [place] FDA and has been authorized for detection and/or diagnosis of SARS-CoV-2 by FDA under an Emergency Use Authorization (EUA). This EUA  will remain in effect (meaning this test can be used) for the duration of the COVID-19 declaration under Section 564(b)(1) of the Act, 21 U.S.C. section 360bbb-3(b)(1), unless the authorization is terminated or revoked.  Performed at [facility], [address], [place], [place] [address]       Radiology Studies: No results found.   Scheduled Meds:  abiraterone acetate  1,000 mg Oral Daily   Chlorhexidine Gluconate Cloth  6 each Topical Daily   darifenacin  15 mg Oral Daily   docusate sodium  100 mg Oral BID   heparin  5,000 Units Subcutaneous Q8H   levothyroxine  200 mcg Oral Q0600   polyethylene glycol  34 g Oral BID   predniSONE  5 mg Oral BID WC   senna-docusate  1  tablet Oral BID   traZODone  150 mg Oral QHS   Continuous Infusions:  lactated ringers 75 mL/hr at 08/25/21 1622     LOS: 3 days     Enzo Bi, MD [facility] If 7PM-7AM, please contact night-coverage 08/25/2021, 7:46 PM

## 2021-08-25 NOTE — Progress Notes (Signed)
[facility]  ROUNDING NOTE   Subjective:   Patient got admitted with acute kidney injury likely secondary to dehydration/hypovolemia.  Patient also reported to have uremic symptoms with the confusion poor appetite and drowsiness.  He was placed on urgent hemodialysis due to worsening renal status with elevated renal markers and azotemia. Temporary hemodialysis catheter in place.   Pt seen and examined.  Stated that better today. Eating well.  Planning for dialysis again tomorrow.  Planning for permacath placement Tuesday for having hemodialysis as outpatient.    Objective:  Vital signs in last 24 hours:  Temp:  [98 F (36.7 C)-99.5 F (37.5 C)] 98.5 F (36.9 C) (02/19 0755) Pulse Rate:  [66-75] 66 (02/19 0755) Resp:  [16-20] 18 (02/19 0755) BP: (145-183)/(67-85) 145/67 (02/19 0755) SpO2:  [94 %-97 %] 95 % (02/19 0755)  Weight change: 1.4 kg Filed Weights   08/23/21 1830 08/24/21 1014 08/24/21 1252  Weight: 101.6 kg 103.4 kg 102.6 kg    Intake/Output: I/O last 3 completed shifts: In: 3308 [I.V.:3308] Out: 1000 [Other:1000]   Intake/Output this shift:  No intake/output data recorded.  Physical Exam: General: Awake with intermittent confusion.   Head: Normocephalic, atraumatic. Moist oral mucosal membranes  Eyes: Anicteric  Neck: Supple  Lungs:  Clear to auscultation, normal effort  Heart: S1S2 no rubs  Abdomen:  Soft, nontender, bowel sounds present  Extremities:  peripheral edema.  Neurologic: Awake, alert, following commands  Skin: No acute rash  Access: Temporary femoral HD catheter    Basic Metabolic Panel: Recent Labs  Lab 08/22/21 1315 08/23/21 0530 08/24/21 0351 08/25/21 0645  NA 127* 130* 131* 133*  K 4.6 5.6* 4.6 4.1  CL 93* 96* 93* 95*  CO2 17* 19* 25 25  GLUCOSE 92 87 79 84  BUN 90* 92* 64* 42*  CREATININE 16.88* 17.03* 12.87* 9.88*  CALCIUM 8.1* 8.0* 8.0* 8.0*  MG  --  2.1 1.9 1.8     Liver Function Tests: Recent Labs  Lab  08/22/21 1315  AST 18  ALT 6  ALKPHOS 73  BILITOT 0.6  PROT 5.8*  ALBUMIN 3.3*    No results for input(s): LIPASE, AMYLASE in the last 168 hours. No results for input(s): AMMONIA in the last 168 hours.  CBC: Recent Labs  Lab 08/22/21 1532 08/23/21 0530 08/24/21 0351 08/25/21 0645  WBC 4.2 4.3 4.5 3.6*  NEUTROABS 3.3  --   --   --   HGB 10.5* 10.4* 10.0* 8.8*  HCT 30.0* 30.1* 29.3* 25.6*  MCV 91.7 90.1 90.2 90.8  PLT 100* 106* 110* 96*     Cardiac Enzymes: No results for input(s): CKTOTAL, CKMB, CKMBINDEX, TROPONINI in the last 168 hours.  BNP: Invalid input(s): POCBNP  CBG: No results for input(s): GLUCAP in the last 168 hours.  Microbiology: Results for orders placed or performed during the hospital encounter of 08/22/21  Resp Panel by RT-PCR (Flu A&B, Covid) Nasopharyngeal Swab     Status: None   Collection Time: 08/22/21  4:11 PM   Specimen: Nasopharyngeal Swab; Nasopharyngeal(NP) swabs in vial transport medium  Result Value Ref Range Status   SARS Coronavirus 2 by RT PCR NEGATIVE NEGATIVE Final    Comment: (NOTE) SARS-CoV-2 target nucleic acids are NOT DETECTED.  The SARS-CoV-2 RNA is generally detectable in upper respiratory specimens during the acute phase of infection. The lowest concentration of SARS-CoV-2 viral copies this assay can detect is 138 copies/mL. A negative result does not preclude SARS-Cov-2 infection and should not  be used as the sole basis for treatment or other patient management decisions. A negative result may occur with  improper specimen collection/handling, submission of specimen other than nasopharyngeal swab, presence of viral mutation(s) within the areas targeted by this assay, and inadequate number of viral copies(<138 copies/mL). A negative result must be combined with clinical observations, patient history, and epidemiological information. The expected result is Negative.  Fact Sheet for Patients:   EntrepreneurPulse.com.au  Fact Sheet for Healthcare Providers:  IncredibleEmployment.be  This test is no t yet approved or cleared by the [place] FDA and  has been authorized for detection and/or diagnosis of SARS-CoV-2 by FDA under an Emergency Use Authorization (EUA). This EUA will remain  in effect (meaning this test can be used) for the duration of the COVID-19 declaration under Section 564(b)(1) of the Act, 21 U.S.C.section 360bbb-3(b)(1), unless the authorization is terminated  or revoked sooner.       Influenza A by PCR NEGATIVE NEGATIVE Final   Influenza B by PCR NEGATIVE NEGATIVE Final    Comment: (NOTE) The Xpert Xpress SARS-CoV-2/FLU/RSV plus assay is intended as an aid in the diagnosis of influenza from Nasopharyngeal swab specimens and should not be used as a sole basis for treatment. Nasal washings and aspirates are unacceptable for Xpert Xpress SARS-CoV-2/FLU/RSV testing.  Fact Sheet for Patients: EntrepreneurPulse.com.au  Fact Sheet for Healthcare Providers: IncredibleEmployment.be  This test is not yet approved or cleared by the [place] FDA and has been authorized for detection and/or diagnosis of SARS-CoV-2 by FDA under an Emergency Use Authorization (EUA). This EUA will remain in effect (meaning this test can be used) for the duration of the COVID-19 declaration under Section 564(b)(1) of the Act, 21 U.S.C. section 360bbb-3(b)(1), unless the authorization is terminated or revoked.  Performed at [facility], [address] [place], [place] [address]     Coagulation Studies: No results for input(s): LABPROT, INR in the last 72 hours.  Urinalysis: No results for input(s): COLORURINE, LABSPEC, PHURINE, GLUCOSEU, HGBUR, BILIRUBINUR, KETONESUR, PROTEINUR, UROBILINOGEN, NITRITE, LEUKOCYTESUR in the last 72 hours.  Invalid input(s): APPERANCEUR     Imaging: PERIPHERAL VASCULAR CATHETERIZATION  Result Date: 08/23/2021 See surgical note for result.    Medications:    lactated ringers 75 mL/hr at 08/25/21 0259    abiraterone acetate  1,000 mg Oral Daily   Chlorhexidine Gluconate Cloth  6 each Topical Daily   darifenacin  15 mg Oral Daily   docusate sodium  100 mg Oral BID   heparin  5,000 Units Subcutaneous Q8H   levothyroxine  200 mcg Oral Q0600   polyethylene glycol  34 g Oral BID   predniSONE  5 mg Oral BID WC   senna-docusate  1 tablet Oral BID   traZODone  150 mg Oral QHS   bisacodyl, oxyCODONE  Assessment/ Plan:  71 y.o. male with past medical history of anemia, cirrhosis,hypertension,GERD, hypothyroidism and prostate cancer admitted to [facility] due to acute kidney injury.  Due to worsening renal status with markedly elevated creatinine level and oliguria patient was placed on hemodialysis.   Acute kidney injury with uremic symptoms likely secondary to dehydration/hypovolemia.  Patient was placed on hemodialysis due to worsening renal markers and anuria.  Currently with a temporary hemodialysis catheter.  Patient received dialysis on Friday and Saturday.Planning for another treatment tomorrow.  Also planning to have permacath placement Tuesday for hemodialysis as outpatient.   Metabolic acidosis likely secondary to acute kidney injury. S/P sodium bicarb drip bicarb level improved  to 25.   Prostate cancer with bone metastasis.  Continue home continue home regimen including prednisone and Zytiga.      LOS: 3 Loyalty Brashier P Domique Reardon 2/19/20233:07 PM

## 2021-08-26 ENCOUNTER — Inpatient Hospital Stay: Payer: PPO

## 2021-08-26 ENCOUNTER — Encounter: Payer: Self-pay | Admitting: Registered Nurse

## 2021-08-26 ENCOUNTER — Inpatient Hospital Stay
Admission: EM | Disposition: A | Discharge status: Home / Self Care | Payer: Self-pay | Source: Home / Self Care | Attending: Hospitalist

## 2021-08-26 ENCOUNTER — Other Ambulatory Visit (INDEPENDENT_AMBULATORY_CARE_PROVIDER_SITE_OTHER): Payer: Self-pay | Admitting: Nurse Practitioner

## 2021-08-26 ENCOUNTER — Encounter: Payer: Self-pay | Admitting: Vascular Surgery

## 2021-08-26 DIAGNOSIS — Z992 Dependence on renal dialysis: Secondary | ICD-10-CM

## 2021-08-26 DIAGNOSIS — N186 End stage renal disease: Secondary | ICD-10-CM

## 2021-08-26 HISTORY — PX: DIALYSIS/PERMA CATHETER INSERTION: CATH118288

## 2021-08-26 LAB — BASIC METABOLIC PANEL
Anion gap: 11 (ref 5–15)
BUN: 46 mg/dL — ABNORMAL HIGH (ref 8–23)
CO2: 26 mmol/L (ref 22–32)
Calcium: 8.1 mg/dL — ABNORMAL LOW (ref 8.9–10.3)
Chloride: 95 mmol/L — ABNORMAL LOW (ref 98–111)
Creatinine, Ser: 11.23 mg/dL — ABNORMAL HIGH (ref 0.61–1.24)
GFR, Estimated: 5 mL/min — ABNORMAL LOW (ref >60)
Glucose, Bld: 83 mg/dL (ref 70–99)
Potassium: 4.2 mmol/L (ref 3.5–5.1)
Sodium: 132 mmol/L — ABNORMAL LOW (ref 135–145)

## 2021-08-26 LAB — CBC
HCT: 26.2 % — ABNORMAL LOW (ref 39.0–52.0)
Hemoglobin: 9.2 g/dL — ABNORMAL LOW (ref 13.0–17.0)
MCH: 31.9 pg (ref 26.0–34.0)
MCHC: 35.1 g/dL (ref 30.0–36.0)
MCV: 91 fL (ref 80.0–100.0)
Platelets: 99 10*3/uL — ABNORMAL LOW (ref 150–400)
RBC: 2.88 MIL/uL — ABNORMAL LOW (ref 4.22–5.81)
RDW: 13.3 % (ref 11.5–15.5)
WBC: 4.1 10*3/uL (ref 4.0–10.5)
nRBC: 0 % (ref 0.0–0.2)

## 2021-08-26 LAB — MAGNESIUM: Magnesium: 1.8 mg/dL (ref 1.7–2.4)

## 2021-08-26 LAB — HEPATITIS B CORE ANTIBODY, TOTAL: Hep B Core Total Ab: NONREACTIVE

## 2021-08-26 SURGERY — DIALYSIS/PERMA CATHETER INSERTION
Anesthesia: Choice

## 2021-08-26 MED ORDER — CEFAZOLIN SODIUM-DEXTROSE 1-4 GM/50ML-% IV SOLN: 1.0000 g | Freq: Once | INTRAVENOUS | Status: DC

## 2021-08-26 MED ORDER — POLYETHYLENE GLYCOL 3350 17 G PO PACK
34.0000 g | PACK | ORAL | Status: DC
  Administered 2021-08-26 – 2021-08-27 (×4): 34 g via ORAL
  Filled 2021-08-26 (×4): qty 2

## 2021-08-26 MED ORDER — MIDAZOLAM HCL 2 MG/2ML IJ SOLN
INTRAMUSCULAR | Status: AC
  Filled 2021-08-26: qty 2

## 2021-08-26 MED ORDER — DIPHENHYDRAMINE HCL 50 MG/ML IJ SOLN: 50.0000 mg | Freq: Once | INTRAMUSCULAR | Status: DC | PRN

## 2021-08-26 MED ORDER — FENTANYL CITRATE PF 50 MCG/ML IJ SOSY
PREFILLED_SYRINGE | INTRAMUSCULAR | Status: AC
  Filled 2021-08-26: qty 1

## 2021-08-26 MED ORDER — HEPARIN SODIUM (PORCINE) 1000 UNIT/ML IJ SOLN
INTRAMUSCULAR | Status: AC
Start: 2021-08-26 — End: 2021-08-26
  Filled 2021-08-26: qty 10

## 2021-08-26 MED ORDER — ONDANSETRON HCL 4 MG/2ML IJ SOLN: 4.0000 mg | Freq: Four times a day (QID) | INTRAMUSCULAR | Status: DC | PRN

## 2021-08-26 MED ORDER — METHYLPREDNISOLONE SODIUM SUCC 125 MG IJ SOLR: 125.0000 mg | Freq: Once | INTRAMUSCULAR | Status: DC | PRN

## 2021-08-26 MED ORDER — MIDAZOLAM HCL 2 MG/ML PO SYRP: 8.0000 mg | ORAL_SOLUTION | Freq: Once | ORAL | Status: DC | PRN

## 2021-08-26 MED ORDER — CEFAZOLIN SODIUM-DEXTROSE 1-4 GM/50ML-% IV SOLN
INTRAVENOUS | Status: DC | PRN
Start: 2021-08-26 — End: 2021-08-26
  Administered 2021-08-26: 1 g via INTRAVENOUS

## 2021-08-26 MED ORDER — SODIUM CHLORIDE 0.9 % IV SOLN
INTRAVENOUS | Status: DC
Start: 2021-08-26 — End: 2021-08-26

## 2021-08-26 MED ORDER — HYDROMORPHONE HCL 1 MG/ML IJ SOLN: 1.0000 mg | Freq: Once | INTRAMUSCULAR | Status: DC | PRN

## 2021-08-26 MED ORDER — MIDAZOLAM HCL 2 MG/2ML IJ SOLN
INTRAMUSCULAR | Status: DC | PRN
  Administered 2021-08-26: 2 mg via INTRAVENOUS

## 2021-08-26 MED ORDER — FAMOTIDINE 20 MG PO TABS: 40.0000 mg | ORAL_TABLET | Freq: Once | ORAL | Status: DC | PRN

## 2021-08-26 SURGICAL SUPPLY — 9 items
CATH CANNON HEMO 15FR 23CM (HEMODIALYSIS SUPPLIES) ×1 IMPLANT
CATH CANNON HEMO 15FR 31CM (HEMODIALYSIS SUPPLIES) ×1 IMPLANT
COVER PROBE U/S 5X48 (MISCELLANEOUS) ×1 IMPLANT
DERMABOND ADVANCED (GAUZE/BANDAGES/DRESSINGS) ×1
DERMABOND ADVANCED .7 DNX12 (GAUZE/BANDAGES/DRESSINGS) IMPLANT
GUIDEWIRE AMPLATZ SHORT (WIRE) ×1 IMPLANT
PACK ANGIOGRAPHY (CUSTOM PROCEDURE TRAY) ×1 IMPLANT
SUT MNCRL AB 4-0 PS2 18 (SUTURE) ×1 IMPLANT
SUT PROLENE 0 CT 1 30 (SUTURE) ×1 IMPLANT

## 2021-08-26 NOTE — Progress Notes (Signed)
°   08/26/21 0930 08/26/21 0945 08/26/21 1000  Vitals  Temp 98.3 F (36.8 C)  --   --   Temp Source Oral  --   --   BP (!) 166/78 (!) 154/87 (!) 151/87  MAP (mmHg) 103 107 105  Pulse Rate 75 75 71  ECG Heart Rate 77 77 71  Resp (!) 8 10 12   During Hemodialysis Assessment  Blood Flow Rate (mL/min) 300 mL/min 300 mL/min 300 mL/min  Arterial Pressure (mmHg) -110 mmHg -120 mmHg -120 mmHg  Venous Pressure (mmHg) 110 mmHg 100 mmHg 100 mmHg  Transmembrane Pressure (mmHg) 70 mmHg 70 mmHg 70 mmHg  Ultrafiltration Rate (mL/min) 830 mL/min 830 mL/min 830 mL/min  Dialysate Flow Rate (mL/min) 500 ml/min 500 ml/min 500 ml/min  Conductivity: Machine  13.8 13.8 13.8  HD Safety Checks Performed Yes Yes Yes  Dialysis Fluid Bolus Normal Saline  --   --   Bolus Amount (mL) 250 mL  --   --   Intra-Hemodialysis Comments Tx initiated Progressing as prescribed Progressing as prescribed    08/26/21 1015 08/26/21 1030 08/26/21 1045  Vitals  Temp  --   --   --   Temp Source  --   --   --   BP (!) 177/86 (!) 167/85 (!) 179/85  MAP (mmHg) 103 107 108  Pulse Rate 76 72  --   ECG Heart Rate 76 72 72  Resp (!) 21 (!) 21 (!) 0  During Hemodialysis Assessment  Blood Flow Rate (mL/min) 300 mL/min 300 mL/min 300 mL/min  Arterial Pressure (mmHg) -120 mmHg -130 mmHg -130 mmHg  Venous Pressure (mmHg) 100 mmHg 100 mmHg 110 mmHg  Transmembrane Pressure (mmHg) 70 mmHg 70 mmHg 70 mmHg  Ultrafiltration Rate (mL/min) 830 mL/min 830 mL/min 830 mL/min  Dialysate Flow Rate (mL/min) 500 ml/min 500 ml/min 500 ml/min  Conductivity: Machine  13.8 13.8 13.7  HD Safety Checks Performed Yes Yes Yes  Dialysis Fluid Bolus  --   --   --   Bolus Amount (mL)  --   --   --   Intra-Hemodialysis Comments Progressing as prescribed Progressing as prescribed Progressing as prescribed

## 2021-08-26 NOTE — Progress Notes (Signed)
Patient completes scheduled 3-hour hemodialysis treatment. Catheter functions well, maintaining the prescribed blood flow rate, no evidence of infection. Targeted UF met, with 2-liter fluid removal, dressing to catheter changed. No medication or labs drawn this treatment. Request for patient to travel to [facility] following completion of treatment, patient sent. Report given to floor nurse.   °

## 2021-08-26 NOTE — Op Note (Signed)
OPERATIVE NOTE    PRE-OPERATIVE DIAGNOSIS: 1. ESRD   POST-OPERATIVE DIAGNOSIS: same as above  PROCEDURE: Ultrasound guidance for vascular access to the left internal jugular vein Fluoroscopic guidance for placement of catheter Placement of a 31 cm tip to cuff tunneled hemodialysis catheter via the left internal jugular vein  SURGEON: Leotis Pain, MD  ANESTHESIA:  Local with Moderate conscious sedation for approximately 25 minutes using 2 mg of Versed   ESTIMATED BLOOD LOSS: 25 cc  CONTRAST: none  FINDING(S): 1.  Patent left internal jugular vein  SPECIMEN(S):  None  INDICATIONS:   Dylan Ho is a 71 y.o. male who presents with renal failure.  The patient needs long term dialysis access for their ESRD, and a Permcath is necessary.  Risks and benefits are discussed and informed consent is obtained.    DESCRIPTION: After obtaining full informed written consent, the patient was brought back to the vascular suited. The patient's left neck and chest were sterilely prepped and draped in a sterile surgical field was created. Moderate conscious sedation was administered during a face to face encounter with the patient throughout the procedure with my supervision of the RN administering medicines and monitoring the patient's vital signs, pulse oximetry, telemetry and mental status throughout from the start of the procedure until the patient was taken to the recovery room.  The left internal jugular vein was visualized with ultrasound and found to be patent. It was then accessed under direct ultrasound guidance and a permanent image was recorded. A wire was placed. After skin nick and dilatation, the peel-away sheath was placed over the wire. I then turned my attention to an area under the clavicle. Approximately 1-2 fingerbreadths below the clavicle a small counterincision was created and tunneled from the subclavicular incision to the access site.  I initially selected a 23 cm tip to cuff  tunneled hemodialysis catheter as we did not have any 27 cm options at our hospital.  The 23 was clearly too short and did not get down into the superior vena cava/cavoatrial junction.  I then placed a stiff Amplatz wire and remove the 23 cm catheter.  Using fluoroscopic guidance, a 31 centimeter tip to cuff tunneled hemodialysis catheter was selected, and tunneled from the subclavicular incision to the access site. It was then placed through the peel-away sheath and the peel-away sheath was removed. Using fluoroscopic guidance the catheter tips were parked in the right atrium. The appropriate distal connectors were placed. It withdrew blood well and flushed easily with heparinized saline and a concentrated heparin solution was then placed. It was secured to the chest wall with 2 Prolene sutures. The access incision was closed single 4-0 Monocryl. A 4-0 Monocryl pursestring suture was placed around the exit site. Sterile dressings were placed. The patient tolerated the procedure well and was taken to the recovery room in stable condition.  COMPLICATIONS: None  CONDITION: Stable  Leotis Pain  08/26/2021, 2:02 PM   This note was created with Dragon Medical transcription system. Any errors in dictation are purely unintentional.

## 2021-08-26 NOTE — Progress Notes (Signed)
°   08/26/21 1100 08/26/21 1115 08/26/21 1130  Vitals  Temp  --   --   --   Temp Source  --   --   --   BP (!) 171/76 (!) 143/82 (!) 156/89  MAP (mmHg) 102 91 107  Pulse Rate 72 68 75  ECG Heart Rate 74 86 76  Resp (!) 5 (!) 29 10  During Hemodialysis Assessment  Blood Flow Rate (mL/min) 300 mL/min 300 mL/min 300 mL/min  Arterial Pressure (mmHg) -120 mmHg -120 mmHg -120 mmHg  Venous Pressure (mmHg) 100 mmHg 100 mmHg 100 mmHg  Transmembrane Pressure (mmHg) 70 mmHg 70 mmHg 70 mmHg  Ultrafiltration Rate (mL/min) 830 mL/min 830 mL/min 840 mL/min  Dialysate Flow Rate (mL/min) 500 ml/min 500 ml/min 500 ml/min  Conductivity: Machine  13.7 13.7 13.7  HD Safety Checks Performed Yes Yes Yes  Intra-Hemodialysis Comments Progressing as prescribed Progressing as prescribed Progressing as prescribed    08/26/21 1145 08/26/21 1200 08/26/21 1215  Vitals  Temp  --   --   --   Temp Source  --   --   --   BP (!) 160/94 (!) 172/78 (!) 166/81  MAP (mmHg) 111 105 105  Pulse Rate 77 75 77  ECG Heart Rate 76 78 76  Resp 20 16 (!) 22  During Hemodialysis Assessment  Blood Flow Rate (mL/min) 300 mL/min 300 mL/min 300 mL/min  Arterial Pressure (mmHg) -120 mmHg -130 mmHg -120 mmHg  Venous Pressure (mmHg) 100 mmHg 100 mmHg 100 mmHg  Transmembrane Pressure (mmHg) 70 mmHg 70 mmHg 70 mmHg  Ultrafiltration Rate (mL/min) 840 mL/min 840 mL/min 840 mL/min  Dialysate Flow Rate (mL/min) 500 ml/min 500 ml/min 500 ml/min  Conductivity: Machine  13.7 13.7 13.7  HD Safety Checks Performed Yes Yes Yes  Intra-Hemodialysis Comments Progressing as prescribed Progressing as prescribed Progressing as prescribed    08/26/21 1234 08/26/21 1244  Vitals  Temp 97.7 F (36.5 C)  --   Temp Source Oral  --   BP (!) 157/81  --   MAP (mmHg) 102  --   Pulse Rate 74 85  ECG Heart Rate 78 84  Resp 15 19  During Hemodialysis Assessment  Blood Flow Rate (mL/min) 300 mL/min  --   Arterial Pressure (mmHg) -120 mmHg  --   Venous  Pressure (mmHg) 100 mmHg  --   Transmembrane Pressure (mmHg) 70 mmHg  --   Ultrafiltration Rate (mL/min) 840 mL/min  --   Dialysate Flow Rate (mL/min) 500 ml/min  --   Conductivity: Machine  13.7  --   HD Safety Checks Performed Yes  --   Intra-Hemodialysis Comments Tx completed  --

## 2021-08-26 NOTE — Progress Notes (Signed)
[facility]  ROUNDING NOTE   Subjective:   Patient got admitted with acute kidney injury likely secondary to dehydration/hypovolemia.  Patient also reported to have uremic symptoms with the confusion poor appetite and drowsiness.  He was placed on urgent hemodialysis due to worsening renal status with elevated renal markers and azotemia. Temporary hemodialysis catheter in place.   Patient seen and evaluated during dialysis   HEMODIALYSIS FLOWSHEET:  Blood Flow Rate (mL/min): 300 mL/min Arterial Pressure (mmHg): -120 mmHg Venous Pressure (mmHg): 100 mmHg Transmembrane Pressure (mmHg): 70 mmHg Ultrafiltration Rate (mL/min): 840 mL/min Dialysate Flow Rate (mL/min): 500 ml/min Conductivity: Machine : 13.7 Conductivity: Machine : 13.7 Dialysis Fluid Bolus: Normal Saline Bolus Amount (mL): 250 mL  No complaints at this time States he feels dialysis helping him feel better   Objective:  Vital signs in last 24 hours:  Temp:  [98.2 F (36.8 C)-98.4 F (36.9 C)] 98.3 F (36.8 C) (02/20 0930) Pulse Rate:  [68-77] 77 (02/20 1215) Resp:  [0-29] 22 (02/20 1215) BP: (143-179)/(75-94) 166/81 (02/20 1215) SpO2:  [94 %-100 %] 100 % (02/20 1215) Weight:  [109.7 kg] 109.7 kg (02/20 0909)  Weight change:  Filed Weights   08/24/21 1014 08/24/21 1252 08/26/21 0909  Weight: 103.4 kg 102.6 kg 109.7 kg    Intake/Output: I/O last 3 completed shifts: In: 2688.2 [I.V.:2688.2] Out: 0    Intake/Output this shift:  No intake/output data recorded.  Physical Exam: General: NAD  Head: Normocephalic, atraumatic. Moist oral mucosal membranes  Eyes: Anicteric  Lungs:  Clear to auscultation, normal effort  Heart: S1S2 no rubs  Abdomen:  Soft, nontender, bowel sounds present  Extremities:  2+ peripheral edema.  Neurologic: Awake, alert, following commands  Skin: No acute rash  Access: Temporary femoral HD catheter    Basic Metabolic Panel: Recent Labs  Lab 08/22/21 1315  08/23/21 0530 08/24/21 0351 08/25/21 0645 08/26/21 0555  NA 127* 130* 131* 133* 132*  K 4.6 5.6* 4.6 4.1 4.2  CL 93* 96* 93* 95* 95*  CO2 17* 19* 25 25 26   GLUCOSE 92 87 79 84 83  BUN 90* 92* 64* 42* 46*  CREATININE 16.88* 17.03* 12.87* 9.88* 11.23*  CALCIUM 8.1* 8.0* 8.0* 8.0* 8.1*  MG  --  2.1 1.9 1.8 1.8     Liver Function Tests: Recent Labs  Lab 08/22/21 1315  AST 18  ALT 6  ALKPHOS 73  BILITOT 0.6  PROT 5.8*  ALBUMIN 3.3*    No results for input(s): LIPASE, AMYLASE in the last 168 hours. No results for input(s): AMMONIA in the last 168 hours.  CBC: Recent Labs  Lab 08/22/21 1532 08/23/21 0530 08/24/21 0351 08/25/21 0645 08/26/21 0555  WBC 4.2 4.3 4.5 3.6* 4.1  NEUTROABS 3.3  --   --   --   --   HGB 10.5* 10.4* 10.0* 8.8* 9.2*  HCT 30.0* 30.1* 29.3* 25.6* 26.2*  MCV 91.7 90.1 90.2 90.8 91.0  PLT 100* 106* 110* 96* 99*     Cardiac Enzymes: No results for input(s): CKTOTAL, CKMB, CKMBINDEX, TROPONINI in the last 168 hours.  BNP: Invalid input(s): POCBNP  CBG: No results for input(s): GLUCAP in the last 168 hours.  Microbiology: Results for orders placed or performed during the hospital encounter of 08/22/21  Resp Panel by RT-PCR (Flu A&B, Covid) Nasopharyngeal Swab     Status: None   Collection Time: 08/22/21  4:11 PM   Specimen: Nasopharyngeal Swab; Nasopharyngeal(NP) swabs in vial transport medium  Result Value  Ref Range Status   SARS Coronavirus 2 by RT PCR NEGATIVE NEGATIVE Final    Comment: (NOTE) SARS-CoV-2 target nucleic acids are NOT DETECTED.  The SARS-CoV-2 RNA is generally detectable in upper respiratory specimens during the acute phase of infection. The lowest concentration of SARS-CoV-2 viral copies this assay can detect is 138 copies/mL. A negative result does not preclude SARS-Cov-2 infection and should not be used as the sole basis for treatment or other patient management decisions. A negative result may occur with  improper  specimen collection/handling, submission of specimen other than nasopharyngeal swab, presence of viral mutation(s) within the areas targeted by this assay, and inadequate number of viral copies(<138 copies/mL). A negative result must be combined with clinical observations, patient history, and epidemiological information. The expected result is Negative.  Fact Sheet for Patients:  EntrepreneurPulse.com.au  Fact Sheet for Healthcare Providers:  IncredibleEmployment.be  This test is no t yet approved or cleared by the [place] FDA and  has been authorized for detection and/or diagnosis of SARS-CoV-2 by FDA under an Emergency Use Authorization (EUA). This EUA will remain  in effect (meaning this test can be used) for the duration of the COVID-19 declaration under Section 564(b)(1) of the Act, 21 U.S.C.section 360bbb-3(b)(1), unless the authorization is terminated  or revoked sooner.       Influenza A by PCR NEGATIVE NEGATIVE Final   Influenza B by PCR NEGATIVE NEGATIVE Final    Comment: (NOTE) The Xpert Xpress SARS-CoV-2/FLU/RSV plus assay is intended as an aid in the diagnosis of influenza from Nasopharyngeal swab specimens and should not be used as a sole basis for treatment. Nasal washings and aspirates are unacceptable for Xpert Xpress SARS-CoV-2/FLU/RSV testing.  Fact Sheet for Patients: EntrepreneurPulse.com.au  Fact Sheet for Healthcare Providers: IncredibleEmployment.be  This test is not yet approved or cleared by the [place] FDA and has been authorized for detection and/or diagnosis of SARS-CoV-2 by FDA under an Emergency Use Authorization (EUA). This EUA will remain in effect (meaning this test can be used) for the duration of the COVID-19 declaration under Section 564(b)(1) of the Act, 21 U.S.C. section 360bbb-3(b)(1), unless the authorization is terminated or revoked.  Performed at  [facility], [address] [place], [place] [address]     Coagulation Studies: No results for input(s): LABPROT, INR in the last 72 hours.  Urinalysis: No results for input(s): COLORURINE, LABSPEC, PHURINE, GLUCOSEU, HGBUR, BILIRUBINUR, KETONESUR, PROTEINUR, UROBILINOGEN, NITRITE, LEUKOCYTESUR in the last 72 hours.  Invalid input(s): APPERANCEUR    Imaging: No results found.   Medications:    lactated ringers 50 mL/hr at 08/26/21 0559    abiraterone acetate  1,000 mg Oral Daily   Chlorhexidine Gluconate Cloth  6 each Topical Daily   darifenacin  15 mg Oral Daily   docusate sodium  100 mg Oral BID   heparin  5,000 Units Subcutaneous Q8H   heparin sodium (porcine)       hydrALAZINE  25 mg Oral Q8H   levothyroxine  200 mcg Oral Q0600   polyethylene glycol  34 g Oral BID   predniSONE  5 mg Oral BID WC   senna-docusate  1 tablet Oral BID   traZODone  150 mg Oral QHS   bisacodyl, oxyCODONE  Assessment/ Plan:  71 y.o. male with past medical history of anemia, cirrhosis,hypertension,GERD, hypothyroidism and prostate cancer admitted to [facility] due to acute kidney injury.  Due to worsening renal status with markedly elevated creatinine level and oliguria patient was placed  on hemodialysis.   Acute kidney injury with uremic symptoms likely secondary to dehydration/hypovolemia.  Patient was placed on hemodialysis due to worsening renal markers and anuria.  Currently with a temporary hemodialysis catheter.  Dialysis initiated on 08/23/2021  Patient receiving third dialysis treatment today, tolerating well.  UF goal 1.5 to 2 L as tolerated.  Continues to experience renal failure and may require short-term dialysis after discharge.  Patient is aware of this plan and agreeable. We will consult vascular surgery for placement of permacath tomorrow. Dialysis coordinator aware of patient and outpatient clinic needs.  Metabolic acidosis likely secondary to acute kidney injury.   Resolved with sodium bicarb drip.  Prostate cancer with bone metastasis.  Continue home regimen including prednisone and Zytiga.       LOS: [place] 2/20/202312:27 PM

## 2021-08-26 NOTE — Progress Notes (Addendum)
PROGRESS NOTE    Dylan Ho  AST:[id number] DOB: 12-12-1950 DOA: 08/22/2021 PCP: Kirk Ruths, MD  [phone]   Assessment & Plan:   Principal Problem:   AKI (acute kidney injury) [facility])   Dylan Ho is a 71 y.o. male with medical history significant of prostate cancer with mets to bone, recent hernia surgery, hypothyroidism who presented from cancer center clinic with Cr of 16.88.   # AKI, POA # Anuric  --Cr 16.88 (baseline ~1.2), presumed from severe dehydration. --started on temp dialysis on 2/17 --no significant urine output despite being on MIVF Plan: --d/c MIVF since pt now has normal oral intake --iHD per nephrology --monitor urine output --permacath placement today for hemodialysis as outpatient.    # Acute metabolic encephalopathy 2/2 # Uremia  --wife noted increased confusion though seems intermittent.  BUN 90.  Also had change in sensation of taste. --started temporary dialysis with improvement in symptoms.   # Metabolic acidosis, resolved --2/2 AKI --d/c'ed bicarb gtt after bicarb level normalized   # Prostate cancer with mets to bone --cont home Zytiga --cont home prednisone   # Hypothyroidism --cont home Synthroid  # Hyperkalemia, resolved --due to AKI --s/p Lokelma 5g x2 doses --dialysis for potassium removal  # HTN --no BP meds on home med list.  BP has been elevated --cont hydralazine 25 mg q8h (new)  # Obesity, BMI 33  # Hyponatremia --likely due to volume depletion, improved with IVF and better oral hydration   DVT prophylaxis: Heparin SQ Code Status: Full code  Family Communication:  Level of care: Med-Surg Dispo:   The patient is from: home Anticipated d/c is to: home Anticipated d/c date is: 1-2 days Patient currently is not medically ready to d/c due to: AKI, started temporary dialysis, need to be set up for outpatient dialysis   Subjective and Interval History:  Pt reported normal oral intake now.     Received HD and Permcath placement.   Objective: Vitals:   08/26/21 1415 08/26/21 1430 08/26/21 1445 08/26/21 1527  BP: (!) 149/74 (!) 147/66 (!) 150/70 (!) 166/79  Pulse: 95 89 81 78  Resp: 10 20 14 16   Temp:    98.5 F (36.9 C)  TempSrc:    Oral  SpO2: 95% 94% 94% 96%  Weight:      Height:        Intake/Output Summary (Last 24 hours) at 08/26/2021 1805 Last data filed at 08/26/2021 1640 Gross per 24 hour  Intake 2839.02 ml  Output 2000 ml  Net 839.02 ml    Filed Weights   08/24/21 1252 08/26/21 0909 08/26/21 1234  Weight: 102.6 kg 109.7 kg 107.7 kg    Examination:   Constitutional: NAD, AAOx3, in dialysis HEENT: conjunctivae and lids normal, EOMI CV: No cyanosis.   RESP: normal respiratory effort, on RA Extremities: No effusions, edema in BLE SKIN: warm, dry Neuro: II - XII grossly intact.   Psych: Normal mood and affect.  Appropriate judgement and reason   Data Reviewed: I have personally reviewed following labs and imaging studies  CBC: Recent Labs  Lab 08/22/21 1532 08/23/21 0530 08/24/21 0351 08/25/21 0645 08/26/21 0555  WBC 4.2 4.3 4.5 3.6* 4.1  NEUTROABS 3.3  --   --   --   --   HGB 10.5* 10.4* 10.0* 8.8* 9.2*  HCT 30.0* 30.1* 29.3* 25.6* 26.2*  MCV 91.7 90.1 90.2 90.8 91.0  PLT 100* 106* 110* 96* 99*   Basic Metabolic Panel: Recent Labs  Lab 08/22/21 1315 08/23/21 0530 08/24/21 0351 08/25/21 0645 08/26/21 0555  NA 127* 130* 131* 133* 132*  K 4.6 5.6* 4.6 4.1 4.2  CL 93* 96* 93* 95* 95*  CO2 17* 19* 25 25 26   GLUCOSE 92 87 79 84 83  BUN 90* 92* 64* 42* 46*  CREATININE 16.88* 17.03* 12.87* 9.88* 11.23*  CALCIUM 8.1* 8.0* 8.0* 8.0* 8.1*  MG  --  2.1 1.9 1.8 1.8   GFR: Estimated Creatinine Clearance: 7.9 mL/min (A) (by C-G formula based on SCr of 11.23 mg/dL (H)). Liver Function Tests: Recent Labs  Lab 08/22/21 1315  AST 18  ALT 6  ALKPHOS 73  BILITOT 0.6  PROT 5.8*  ALBUMIN 3.3*   No results for input(s): LIPASE,  AMYLASE in the last 168 hours. No results for input(s): AMMONIA in the last 168 hours. Coagulation Profile: No results for input(s): INR, PROTIME in the last 168 hours. Cardiac Enzymes: No results for input(s): CKTOTAL, CKMB, CKMBINDEX, TROPONINI in the last 168 hours. BNP (last 3 results) No results for input(s): PROBNP in the last 8760 hours. HbA1C: No results for input(s): HGBA1C in the last 72 hours. CBG: No results for input(s): GLUCAP in the last 168 hours. Lipid Profile: No results for input(s): CHOL, HDL, LDLCALC, TRIG, CHOLHDL, LDLDIRECT in the last 72 hours. Thyroid Function Tests: No results for input(s): TSH, T4TOTAL, FREET4, T3FREE, THYROIDAB in the last 72 hours. Anemia Panel: No results for input(s): VITAMINB12, FOLATE, FERRITIN, TIBC, IRON, RETICCTPCT in the last 72 hours. Sepsis Labs: No results for input(s): PROCALCITON, LATICACIDVEN in the last 168 hours.  Recent Results (from the past 240 hour(s))  Resp Panel by RT-PCR (Flu A&B, Covid) Nasopharyngeal Swab     Status: None   Collection Time: 08/22/21  4:11 PM   Specimen: Nasopharyngeal Swab; Nasopharyngeal(NP) swabs in vial transport medium  Result Value Ref Range Status   SARS Coronavirus 2 by RT PCR NEGATIVE NEGATIVE Final    Comment: (NOTE) SARS-CoV-2 target nucleic acids are NOT DETECTED.  The SARS-CoV-2 RNA is generally detectable in upper respiratory specimens during the acute phase of infection. The lowest concentration of SARS-CoV-2 viral copies this assay can detect is 138 copies/mL. A negative result does not preclude SARS-Cov-2 infection and should not be used as the sole basis for treatment or other patient management decisions. A negative result may occur with  improper specimen collection/handling, submission of specimen other than nasopharyngeal swab, presence of viral mutation(s) within the areas targeted by this assay, and inadequate number of viral copies(<138 copies/mL). A negative result  must be combined with clinical observations, patient history, and epidemiological information. The expected result is Negative.  Fact Sheet for Patients:  EntrepreneurPulse.com.au  Fact Sheet for Healthcare Providers:  IncredibleEmployment.be  This test is no t yet approved or cleared by the [place] FDA and  has been authorized for detection and/or diagnosis of SARS-CoV-2 by FDA under an Emergency Use Authorization (EUA). This EUA will remain  in effect (meaning this test can be used) for the duration of the COVID-19 declaration under Section 564(b)(1) of the Act, 21 U.S.C.section 360bbb-3(b)(1), unless the authorization is terminated  or revoked sooner.       Influenza A by PCR NEGATIVE NEGATIVE Final   Influenza B by PCR NEGATIVE NEGATIVE Final    Comment: (NOTE) The Xpert Xpress SARS-CoV-2/FLU/RSV plus assay is intended as an aid in the diagnosis of influenza from Nasopharyngeal swab specimens and should not be used as a sole basis for  treatment. Nasal washings and aspirates are unacceptable for Xpert Xpress SARS-CoV-2/FLU/RSV testing.  Fact Sheet for Patients: EntrepreneurPulse.com.au  Fact Sheet for Healthcare Providers: IncredibleEmployment.be  This test is not yet approved or cleared by the [place] FDA and has been authorized for detection and/or diagnosis of SARS-CoV-2 by FDA under an Emergency Use Authorization (EUA). This EUA will remain in effect (meaning this test can be used) for the duration of the COVID-19 declaration under Section 564(b)(1) of the Act, 21 U.S.C. section 360bbb-3(b)(1), unless the authorization is terminated or revoked.  Performed at [facility], [address] [place], [place] [address]       Radiology Studies: DG Chest 1 View  Result Date: 08/26/2021 CLINICAL DATA:  Weakness and shortness of breath, received dialysis treatment today EXAM:  CHEST  1 VIEW COMPARISON:  Portable exam 9794 hours compared to 11/22/2020 FINDINGS: RIGHT jugular Port-A-Cath tip projecting over SVC. LEFT jugular dual lumen central venous catheter with tip projecting over RIGHT atrium. Normal heart size, mediastinal contours, and pulmonary vascularity. Lungs clear. No infiltrate, pleural effusion, or pneumothorax. IMPRESSION: No acute abnormalities. Electronically Signed   By: Lavonia Dana M.D.   On: 08/26/2021 18:00   PERIPHERAL VASCULAR CATHETERIZATION  Result Date: 08/26/2021 See surgical note for result.    Scheduled Meds:  abiraterone acetate  1,000 mg Oral Daily   Chlorhexidine Gluconate Cloth  6 each Topical Daily   darifenacin  15 mg Oral Daily   docusate sodium  100 mg Oral BID   heparin  5,000 Units Subcutaneous Q8H   heparin sodium (porcine)       hydrALAZINE  25 mg Oral Q8H   levothyroxine  200 mcg Oral Q0600   midazolam       polyethylene glycol  34 g Oral Q2H   predniSONE  5 mg Oral BID WC   senna-docusate  1 tablet Oral BID   traZODone  150 mg Oral QHS   Continuous Infusions:     LOS: 4 days     Enzo Bi, MD [facility] If 7PM-7AM, please contact night-coverage 08/26/2021, 6:05 PM

## 2021-08-26 NOTE — Progress Notes (Signed)
Transported to HD

## 2021-08-26 NOTE — Care Management Important Message (Signed)
Important Message  Patient Details  Name: Dylan Ho MRN: [id number] Date of Birth: 1950/07/29   Medicare Important Message Given:  Yes  Patient out of room upon time of visit.  Copy of Medicare IM left in room for reference.   Dannette Barbara 08/26/2021, 10:33 AM

## 2021-08-27 ENCOUNTER — Encounter: Payer: Self-pay | Admitting: Vascular Surgery

## 2021-08-27 LAB — CBC
HCT: 23.4 % — ABNORMAL LOW (ref 39.0–52.0)
Hemoglobin: 8 g/dL — ABNORMAL LOW (ref 13.0–17.0)
MCH: 31.5 pg (ref 26.0–34.0)
MCHC: 34.2 g/dL (ref 30.0–36.0)
MCV: 92.1 fL (ref 80.0–100.0)
Platelets: 100 10*3/uL — ABNORMAL LOW (ref 150–400)
RBC: 2.54 MIL/uL — ABNORMAL LOW (ref 4.22–5.81)
RDW: 13.3 % (ref 11.5–15.5)
WBC: 4.6 10*3/uL (ref 4.0–10.5)
nRBC: 0 % (ref 0.0–0.2)

## 2021-08-27 LAB — BASIC METABOLIC PANEL
Anion gap: 11 (ref 5–15)
BUN: 27 mg/dL — ABNORMAL HIGH (ref 8–23)
CO2: 25 mmol/L (ref 22–32)
Calcium: 7.7 mg/dL — ABNORMAL LOW (ref 8.9–10.3)
Chloride: 96 mmol/L — ABNORMAL LOW (ref 98–111)
Creatinine, Ser: 7.97 mg/dL — ABNORMAL HIGH (ref 0.61–1.24)
GFR, Estimated: 7 mL/min — ABNORMAL LOW (ref >60)
Glucose, Bld: 91 mg/dL (ref 70–99)
Potassium: 3.7 mmol/L (ref 3.5–5.1)
Sodium: 132 mmol/L — ABNORMAL LOW (ref 135–145)

## 2021-08-27 LAB — MAGNESIUM: Magnesium: 1.6 mg/dL — ABNORMAL LOW (ref 1.7–2.4)

## 2021-08-27 NOTE — TOC Initial Note (Signed)
Transition of Care [facility]) - Initial/Assessment Note    Patient Details  Name: MARKEESE BOYAJIAN MRN: [id number] Date of Birth: [birth year]  Transition of [facility] CM/SW Contact:    Beverly Sessions, RN Phone Number: 08/27/2021, 2:25 PM  Clinical Narrative:                  Admitted for: Kidney failure Admitted from:home with wife POE:[id number]  Current home health/prior home health/DME: no DME or home health  Elvera Bicker HD coordinator is working on new outpatient HD placement.  Patient states that his wife will transport him to HD. Declines needs for home health or DME at discharge   Expected Discharge Plan: Home/Self Care Barriers to Discharge: Continued Medical Work up   Patient Goals and CMS Choice        Expected Discharge Plan and Services Expected Discharge Plan: Home/Self Care       Living arrangements for the past 2 months: Single Family Home                                      Prior Living Arrangements/Services Living arrangements for the past 2 months: Single Family Home Lives with:: Spouse Patient language and need for interpreter reviewed:: Yes Do you feel safe going back to the place where you live?: Yes      Need for Family Participation in Patient Care: Yes (Comment) Care giver support system in place?: Yes (comment)   Criminal Activity/Legal Involvement Pertinent to Current Situation/Hospitalization: No - Comment as needed  Activities of Daily Living Home Assistive Devices/Equipment: None ADL Screening (condition at time of admission) Patient's cognitive ability adequate to safely complete daily activities?: Yes Is the patient deaf or have difficulty hearing?: Yes Does the patient have difficulty seeing, even when wearing glasses/contacts?: No Does the patient have difficulty concentrating, remembering, or making decisions?: No Patient able to express need for assistance with ADLs?: Yes Does the patient have difficulty dressing or  bathing?: No Independently performs ADLs?: Yes (appropriate for developmental age) Does the patient have difficulty walking or climbing stairs?: No Weakness of Legs: None Weakness of Arms/Hands: None  Permission Sought/Granted                  Emotional Assessment Appearance:: Appears older than stated age     Orientation: : Oriented to Self, Oriented to Place, Oriented to  Time, Oriented to Situation Alcohol / Substance Use: Not Applicable Psych Involvement: No (comment)  Admission diagnosis:  AKI (acute kidney injury) ([place]) [N17.9] Patient Active Problem List   Diagnosis Date Noted   AKI (acute kidney injury) ([place]) 08/22/2021   Prostate cancer metastatic to bone ([place]) 11/16/2019   Goals of care, counseling/discussion 11/16/2019   Tear of meniscus of knee 01/28/2019   Constipation 07/09/2017   Impingement syndrome of right shoulder region 05/13/2017   Iron deficiency anemia due to chronic blood loss 02/27/2017   B12 deficiency 01/09/2017   Detrusor instability 01/09/2017   Osteoarthritis of both knees 01/09/2017   Insomnia 12/12/2016   Hx of gastric bypass 12/12/2016   Cirrhosis of liver ([place]) 43/15/4008   Positive Helicobacter pylori serology 11/19/2016   Sleep apnea 11/19/2016   Von Willebrand disease 11/19/2016   Anastomotic ulcer S/P gastric bypass    Obesity (BMI 30.0-34.9) 01/19/2014   Nondependent alcohol abuse 01/11/2010   Hypothyroidism 01/11/2010   Hyperlipidemia 01/11/2010   Coronary atherosclerosis of native  coronary artery 09/22/2008   PCP:  Kirk Ruths, MD Pharmacy:   [facility] [address] [place] - [place] [address] [place] [address] Phone: [phone] Fax: [phone]  [place], [place] [address] Phone: [phone] Fax: [phone]  [place] [address] Phone: [phone] Fax:  [phone]     Social Determinants of Health (SDOH) Interventions    Readmission Risk Interventions Readmission Risk Prevention Plan 08/27/2021  Transportation Screening Complete  Social Work Consult for [place] Planning/Counseling [place] Not Applicable  Medication Review Press photographer) Complete  Some recent data might be hidden

## 2021-08-27 NOTE — Progress Notes (Signed)
[facility]  ROUNDING NOTE   Subjective:   Patient got admitted with acute kidney injury likely secondary to dehydration/hypovolemia.  Patient also reported to have uremic symptoms with the confusion poor appetite and drowsiness.  He was placed on urgent hemodialysis due to worsening renal status with elevated renal markers and azotemia. Temporary hemodialysis catheter in place.   Patient seen resting in bed Received dialysis yesterday, tolerated well States he has noticed small improvement since initiating treatment Lower extremity edema improved Denies pain or discomfort States he is urinating more frequent 6 urinary occurrences recorded in past 24 hours  Objective:  Vital signs in last 24 hours:  Temp:  [97.7 F (36.5 C)-99 F (37.2 C)] 99 F (37.2 C) (02/21 0748) Pulse Rate:  [74-99] 81 (02/21 0748) Resp:  [8-24] 18 (02/21 0748) BP: (127-182)/(65-94) 148/72 (02/21 0748) SpO2:  [94 %-100 %] 95 % (02/21 0748) Weight:  [107.7 kg] 107.7 kg (02/20 1234)  Weight change:  Filed Weights   08/24/21 1252 08/26/21 0909 08/26/21 1234  Weight: 102.6 kg 109.7 kg 107.7 kg    Intake/Output: I/O last 3 completed shifts: In: 2959 [P.O.:120; I.V.:2839] Out: 2000 [Other:2000]   Intake/Output this shift:  Total I/O In: 240 [P.O.:240] Out: -   Physical Exam: General: NAD  Head: Normocephalic, atraumatic. Moist oral mucosal membranes  Eyes: Anicteric  Lungs:  Clear to auscultation, normal effort  Heart: S1S2 no rubs  Abdomen:  Soft, nontender, bowel sounds present  Extremities:  1+ peripheral edema.  Neurologic: Awake, alert, following commands  Skin: No acute rash  Access: Temporary femoral HD catheter, left IJ PermCath placed on 08/26/2021 by Dr. Lucky Cowboy    Basic Metabolic Panel: Recent Labs  Lab 08/23/21 0530 08/24/21 0351 08/25/21 0645 08/26/21 0555 08/27/21 0545  NA 130* 131* 133* 132* 132*  K 5.6* 4.6 4.1 4.2 3.7  CL 96* 93* 95* 95* 96*  CO2 19* 25 25 26  25   GLUCOSE 87 79 84 83 91  BUN 92* 64* 42* 46* 27*  CREATININE 17.03* 12.87* 9.88* 11.23* 7.97*  CALCIUM 8.0* 8.0* 8.0* 8.1* 7.7*  MG 2.1 1.9 1.8 1.8 1.6*     Liver Function Tests: Recent Labs  Lab 08/22/21 1315  AST 18  ALT 6  ALKPHOS 73  BILITOT 0.6  PROT 5.8*  ALBUMIN 3.3*    No results for input(s): LIPASE, AMYLASE in the last 168 hours. No results for input(s): AMMONIA in the last 168 hours.  CBC: Recent Labs  Lab 08/22/21 1532 08/23/21 0530 08/24/21 0351 08/25/21 0645 08/26/21 0555 08/27/21 0545  WBC 4.2 4.3 4.5 3.6* 4.1 4.6  NEUTROABS 3.3  --   --   --   --   --   HGB 10.5* 10.4* 10.0* 8.8* 9.2* 8.0*  HCT 30.0* 30.1* 29.3* 25.6* 26.2* 23.4*  MCV 91.7 90.1 90.2 90.8 91.0 92.1  PLT 100* 106* 110* 96* 99* 100*     Cardiac Enzymes: No results for input(s): CKTOTAL, CKMB, CKMBINDEX, TROPONINI in the last 168 hours.  BNP: Invalid input(s): POCBNP  CBG: No results for input(s): GLUCAP in the last 168 hours.  Microbiology: Results for orders placed or performed during the hospital encounter of 08/22/21  Resp Panel by RT-PCR (Flu A&B, Covid) Nasopharyngeal Swab     Status: None   Collection Time: 08/22/21  4:11 PM   Specimen: Nasopharyngeal Swab; Nasopharyngeal(NP) swabs in vial transport medium  Result Value Ref Range Status   SARS Coronavirus 2 by RT PCR NEGATIVE NEGATIVE  Final    Comment: (NOTE) SARS-CoV-2 target nucleic acids are NOT DETECTED.  The SARS-CoV-2 RNA is generally detectable in upper respiratory specimens during the acute phase of infection. The lowest concentration of SARS-CoV-2 viral copies this assay can detect is 138 copies/mL. A negative result does not preclude SARS-Cov-2 infection and should not be used as the sole basis for treatment or other patient management decisions. A negative result may occur with  improper specimen collection/handling, submission of specimen other than nasopharyngeal swab, presence of viral mutation(s)  within the areas targeted by this assay, and inadequate number of viral copies(<138 copies/mL). A negative result must be combined with clinical observations, patient history, and epidemiological information. The expected result is Negative.  Fact Sheet for Patients:  EntrepreneurPulse.com.au  Fact Sheet for Healthcare Providers:  IncredibleEmployment.be  This test is no t yet approved or cleared by the [place] FDA and  has been authorized for detection and/or diagnosis of SARS-CoV-2 by FDA under an Emergency Use Authorization (EUA). This EUA will remain  in effect (meaning this test can be used) for the duration of the COVID-19 declaration under Section 564(b)(1) of the Act, 21 U.S.C.section 360bbb-3(b)(1), unless the authorization is terminated  or revoked sooner.       Influenza A by PCR NEGATIVE NEGATIVE Final   Influenza B by PCR NEGATIVE NEGATIVE Final    Comment: (NOTE) The Xpert Xpress SARS-CoV-2/FLU/RSV plus assay is intended as an aid in the diagnosis of influenza from Nasopharyngeal swab specimens and should not be used as a sole basis for treatment. Nasal washings and aspirates are unacceptable for Xpert Xpress SARS-CoV-2/FLU/RSV testing.  Fact Sheet for Patients: EntrepreneurPulse.com.au  Fact Sheet for Healthcare Providers: IncredibleEmployment.be  This test is not yet approved or cleared by the [place] FDA and has been authorized for detection and/or diagnosis of SARS-CoV-2 by FDA under an Emergency Use Authorization (EUA). This EUA will remain in effect (meaning this test can be used) for the duration of the COVID-19 declaration under Section 564(b)(1) of the Act, 21 U.S.C. section 360bbb-3(b)(1), unless the authorization is terminated or revoked.  Performed at [facility], [address] [place], [place] [address]     Coagulation Studies: No results for  input(s): LABPROT, INR in the last 72 hours.  Urinalysis: No results for input(s): COLORURINE, LABSPEC, PHURINE, GLUCOSEU, HGBUR, BILIRUBINUR, KETONESUR, PROTEINUR, UROBILINOGEN, NITRITE, LEUKOCYTESUR in the last 72 hours.  Invalid input(s): APPERANCEUR    Imaging: DG Chest 1 View  Result Date: 08/26/2021 CLINICAL DATA:  Weakness and shortness of breath, received dialysis treatment today EXAM: CHEST  1 VIEW COMPARISON:  Portable exam 1734 hours compared to 11/22/2020 FINDINGS: RIGHT jugular Port-A-Cath tip projecting over SVC. LEFT jugular dual lumen central venous catheter with tip projecting over RIGHT atrium. Normal heart size, mediastinal contours, and pulmonary vascularity. Lungs clear. No infiltrate, pleural effusion, or pneumothorax. IMPRESSION: No acute abnormalities. Electronically Signed   By: Lavonia Dana M.D.   On: 08/26/2021 18:00   PERIPHERAL VASCULAR CATHETERIZATION  Result Date: 08/26/2021 See surgical note for result.    Medications:      abiraterone acetate  1,000 mg Oral Daily   Chlorhexidine Gluconate Cloth  6 each Topical Daily   darifenacin  15 mg Oral Daily   docusate sodium  100 mg Oral BID   heparin  5,000 Units Subcutaneous Q8H   hydrALAZINE  25 mg Oral Q8H   levothyroxine  200 mcg Oral Q0600   predniSONE  5 mg Oral BID WC  senna-docusate  1 tablet Oral BID   traZODone  150 mg Oral QHS   bisacodyl, HYDROmorphone (DILAUDID) injection, ondansetron (ZOFRAN) IV, oxyCODONE  Assessment/ Plan:  71 y.o. male with past medical history of anemia, cirrhosis,hypertension,GERD, hypothyroidism and prostate cancer admitted to [facility] due to acute kidney injury.  Due to worsening renal status with markedly elevated creatinine level and oliguria patient was placed on hemodialysis.   Acute kidney injury with uremic symptoms likely secondary to dehydration/hypovolemia.  Patient was placed on hemodialysis due to worsening renal markers and anuria.  Currently with a temporary  hemodialysis catheter.  Dialysis initiated on 08/23/2021  Received dialysis yesterday with UF goal 2 L achieved.  Appreciate vascular surgery placing left PermCath.  Initially ordered HD temp cath to be removed yesterday, followed up with nursing and will be removed today to decrease infection risk and encourage mobility.  Next dialysis treatment will be tomorrow.  Patient encouraged to be out of bed and seated in chair for at least 4 hours. Dialysis coordinator aware of patient and awaiting outpatient clinic assignment.  Acute metabolic acidosis likely secondary to acute kidney injury.  Resolved   Prostate cancer with bone metastasis.  Continue home regimen including prednisone and Zytiga.       LOS: [place] 2/21/202312:33 PM

## 2021-08-27 NOTE — Progress Notes (Signed)
Working on outpatient hemodialysis placement at [facility].

## 2021-08-27 NOTE — Progress Notes (Signed)
PROGRESS NOTE    Dylan Ho  GGY:[id number] DOB: 1951/01/31 DOA: 08/22/2021 PCP: Kirk Ruths, MD  [phone]   Assessment & Plan:   Principal Problem:   AKI (acute kidney injury) [facility])   Dylan Ho is a 71 y.o. male with medical history significant of prostate cancer with mets to bone, recent hernia surgery, hypothyroidism who presented from cancer center clinic with Cr of 16.88.   # AKI, POA # Anuric  --Cr 16.88 (baseline ~1.2), presumed from severe dehydration. --started on temp dialysis on 2/17 --no significant urine output despite being on MIVF --PermCath placed Plan: --monitor urine output --plan for iHD tomorrow Wed and start outpatient dialysis on Friday.   # Acute metabolic encephalopathy 2/2 # Uremia  --wife noted increased confusion though seems intermittent.  BUN 90.  Also had change in sensation of taste.  All resolved after starting dialysis.   # Metabolic acidosis, resolved --2/2 AKI --d/c'ed bicarb gtt after bicarb level normalized   # Prostate cancer with mets to bone --cont home Zytiga --cont home prednisone   # Hypothyroidism --cont home Synthroid  # Hyperkalemia, resolved --due to AKI --s/p Lokelma 5g x2 doses --dialysis for potassium removal  # HTN --no BP meds on home med list.  BP has been elevated --increase hydralazine to 50 mg q8h (new med)  # Obesity, BMI 33  # Hyponatremia --likely due to volume depletion, improved with IVF and better oral hydration   DVT prophylaxis: Heparin SQ Code Status: Full code  Family Communication:  Level of care: Med-Surg Dispo:   The patient is from: home Anticipated d/c is to: home Anticipated d/c date is: tomorrow after dialysis   Subjective and Interval History:  Pt had large BM in response to aggressive Miralax, and felt much better afterwards.   Pt received PermCath today.  Pt felt ready to go home, however, nephro wants to do HD tomorrow with PermCath before  discharge.   Objective: Vitals:   08/27/21 0748 08/27/21 1347 08/27/21 1541 08/27/21 1956  BP: (!) 148/72 (!) 160/77 (!) 174/89 (!) 167/80  Pulse: 81  83 78  Resp: 18  18 20   Temp: 99 F (37.2 C)  98.5 F (36.9 C) 98.4 F (36.9 C)  TempSrc: Oral   Oral  SpO2: 95%  96% 96%  Weight:      Height:        Intake/Output Summary (Last 24 hours) at 08/28/2021 0341 Last data filed at 08/27/2021 2310 Gross per 24 hour  Intake 240 ml  Output --  Net 240 ml    Filed Weights   08/24/21 1252 08/26/21 0909 08/26/21 1234  Weight: 102.6 kg 109.7 kg 107.7 kg    Examination:   Constitutional: NAD, AAOx3, sitting in recliner HEENT: conjunctivae and lids normal, EOMI CV: No cyanosis.   RESP: normal respiratory effort, on RA Extremities: No effusions, edema in BLE SKIN: warm, dry Neuro: II - XII grossly intact.   Psych: Normal mood and affect.  Appropriate judgement and reason   Data Reviewed: I have personally reviewed following labs and imaging studies  CBC: Recent Labs  Lab 08/22/21 1532 08/23/21 0530 08/24/21 0351 08/25/21 0645 08/26/21 0555 08/27/21 0545  WBC 4.2 4.3 4.5 3.6* 4.1 4.6  NEUTROABS 3.3  --   --   --   --   --   HGB 10.5* 10.4* 10.0* 8.8* 9.2* 8.0*  HCT 30.0* 30.1* 29.3* 25.6* 26.2* 23.4*  MCV 91.7 90.1 90.2 90.8 91.0 92.1  PLT  100* 106* 110* 96* 99* 725*   Basic Metabolic Panel: Recent Labs  Lab 08/23/21 0530 08/24/21 0351 08/25/21 0645 08/26/21 0555 08/27/21 0545  NA 130* 131* 133* 132* 132*  K 5.6* 4.6 4.1 4.2 3.7  CL 96* 93* 95* 95* 96*  CO2 19* 25 25 26 25   GLUCOSE 87 79 84 83 91  BUN 92* 64* 42* 46* 27*  CREATININE 17.03* 12.87* 9.88* 11.23* 7.97*  CALCIUM 8.0* 8.0* 8.0* 8.1* 7.7*  MG 2.1 1.9 1.8 1.8 1.6*   GFR: Estimated Creatinine Clearance: 11.1 mL/min (A) (by C-G formula based on SCr of 7.97 mg/dL (H)). Liver Function Tests: Recent Labs  Lab 08/22/21 1315  AST 18  ALT 6  ALKPHOS 73  BILITOT 0.6  PROT 5.8*  ALBUMIN 3.3*    No results for input(s): LIPASE, AMYLASE in the last 168 hours. No results for input(s): AMMONIA in the last 168 hours. Coagulation Profile: No results for input(s): INR, PROTIME in the last 168 hours. Cardiac Enzymes: No results for input(s): CKTOTAL, CKMB, CKMBINDEX, TROPONINI in the last 168 hours. BNP (last 3 results) No results for input(s): PROBNP in the last 8760 hours. HbA1C: No results for input(s): HGBA1C in the last 72 hours. CBG: No results for input(s): GLUCAP in the last 168 hours. Lipid Profile: No results for input(s): CHOL, HDL, LDLCALC, TRIG, CHOLHDL, LDLDIRECT in the last 72 hours. Thyroid Function Tests: No results for input(s): TSH, T4TOTAL, FREET4, T3FREE, THYROIDAB in the last 72 hours. Anemia Panel: No results for input(s): VITAMINB12, FOLATE, FERRITIN, TIBC, IRON, RETICCTPCT in the last 72 hours. Sepsis Labs: No results for input(s): PROCALCITON, LATICACIDVEN in the last 168 hours.  Recent Results (from the past 240 hour(s))  Resp Panel by RT-PCR (Flu A&B, Covid) Nasopharyngeal Swab     Status: None   Collection Time: 08/22/21  4:11 PM   Specimen: Nasopharyngeal Swab; Nasopharyngeal(NP) swabs in vial transport medium  Result Value Ref Range Status   SARS Coronavirus 2 by RT PCR NEGATIVE NEGATIVE Final    Comment: (NOTE) SARS-CoV-2 target nucleic acids are NOT DETECTED.  The SARS-CoV-2 RNA is generally detectable in upper respiratory specimens during the acute phase of infection. The lowest concentration of SARS-CoV-2 viral copies this assay can detect is 138 copies/mL. A negative result does not preclude SARS-Cov-2 infection and should not be used as the sole basis for treatment or other patient management decisions. A negative result may occur with  improper specimen collection/handling, submission of specimen other than nasopharyngeal swab, presence of viral mutation(s) within the areas targeted by this assay, and inadequate number of  viral copies(<138 copies/mL). A negative result must be combined with clinical observations, patient history, and epidemiological information. The expected result is Negative.  Fact Sheet for Patients:  EntrepreneurPulse.com.au  Fact Sheet for Healthcare Providers:  IncredibleEmployment.be  This test is no t yet approved or cleared by the [place] FDA and  has been authorized for detection and/or diagnosis of SARS-CoV-2 by FDA under an Emergency Use Authorization (EUA). This EUA will remain  in effect (meaning this test can be used) for the duration of the COVID-19 declaration under Section 564(b)(1) of the Act, 21 U.S.C.section 360bbb-3(b)(1), unless the authorization is terminated  or revoked sooner.       Influenza A by PCR NEGATIVE NEGATIVE Final   Influenza B by PCR NEGATIVE NEGATIVE Final    Comment: (NOTE) The Xpert Xpress SARS-CoV-2/FLU/RSV plus assay is intended as an aid in the diagnosis of influenza from Nasopharyngeal  swab specimens and should not be used as a sole basis for treatment. Nasal washings and aspirates are unacceptable for Xpert Xpress SARS-CoV-2/FLU/RSV testing.  Fact Sheet for Patients: EntrepreneurPulse.com.au  Fact Sheet for Healthcare Providers: IncredibleEmployment.be  This test is not yet approved or cleared by the [place] FDA and has been authorized for detection and/or diagnosis of SARS-CoV-2 by FDA under an Emergency Use Authorization (EUA). This EUA will remain in effect (meaning this test can be used) for the duration of the COVID-19 declaration under Section 564(b)(1) of the Act, 21 U.S.C. section 360bbb-3(b)(1), unless the authorization is terminated or revoked.  Performed at [facility], [address], [place], [place] [address]       Radiology Studies: DG Chest 1 View  Result Date: 08/26/2021 CLINICAL DATA:  Weakness and shortness of  breath, received dialysis treatment today EXAM: CHEST  1 VIEW COMPARISON:  Portable exam 2122 hours compared to 11/22/2020 FINDINGS: RIGHT jugular Port-A-Cath tip projecting over SVC. LEFT jugular dual lumen central venous catheter with tip projecting over RIGHT atrium. Normal heart size, mediastinal contours, and pulmonary vascularity. Lungs clear. No infiltrate, pleural effusion, or pneumothorax. IMPRESSION: No acute abnormalities. Electronically Signed   By: Lavonia Dana M.D.   On: 08/26/2021 18:00   PERIPHERAL VASCULAR CATHETERIZATION  Result Date: 08/26/2021 See surgical note for result.    Scheduled Meds:  abiraterone acetate  1,000 mg Oral Daily   Chlorhexidine Gluconate Cloth  6 each Topical Daily   darifenacin  15 mg Oral Daily   docusate sodium  100 mg Oral BID   heparin  5,000 Units Subcutaneous Q8H   hydrALAZINE  25 mg Oral Q8H   levothyroxine  200 mcg Oral Q0600   predniSONE  5 mg Oral BID WC   senna-docusate  1 tablet Oral BID   traZODone  150 mg Oral QHS   Continuous Infusions:     LOS: 6 days     Enzo Bi, MD [facility] If 7PM-7AM, please contact night-coverage 08/28/2021, 3:41 AM

## 2021-08-28 ENCOUNTER — Encounter: Payer: Self-pay | Admitting: Oncology

## 2021-08-28 ENCOUNTER — Other Ambulatory Visit (HOSPITAL_COMMUNITY): Payer: Self-pay

## 2021-08-28 ENCOUNTER — Inpatient Hospital Stay: Payer: Self-pay | Admitting: Oncology

## 2021-08-28 ENCOUNTER — Encounter: Payer: PPO | Admitting: Surgery

## 2021-08-28 ENCOUNTER — Other Ambulatory Visit: Payer: Self-pay | Admitting: Oncology

## 2021-08-28 DIAGNOSIS — C61 Malignant neoplasm of prostate: Secondary | ICD-10-CM

## 2021-08-28 DIAGNOSIS — C7951 Secondary malignant neoplasm of bone: Secondary | ICD-10-CM

## 2021-08-28 LAB — CBC
HCT: 22.4 % — ABNORMAL LOW (ref 39.0–52.0)
Hemoglobin: 7.6 g/dL — ABNORMAL LOW (ref 13.0–17.0)
MCH: 31.3 pg (ref 26.0–34.0)
MCHC: 33.9 g/dL (ref 30.0–36.0)
MCV: 92.2 fL (ref 80.0–100.0)
Platelets: 92 10*3/uL — ABNORMAL LOW (ref 150–400)
RBC: 2.43 MIL/uL — ABNORMAL LOW (ref 4.22–5.81)
RDW: 13.6 % (ref 11.5–15.5)
WBC: 4.5 10*3/uL (ref 4.0–10.5)
nRBC: 0 % (ref 0.0–0.2)

## 2021-08-28 LAB — BASIC METABOLIC PANEL
Anion gap: 8 (ref 5–15)
BUN: 33 mg/dL — ABNORMAL HIGH (ref 8–23)
CO2: 28 mmol/L (ref 22–32)
Calcium: 7.8 mg/dL — ABNORMAL LOW (ref 8.9–10.3)
Chloride: 96 mmol/L — ABNORMAL LOW (ref 98–111)
Creatinine, Ser: 9.49 mg/dL — ABNORMAL HIGH (ref 0.61–1.24)
GFR, Estimated: 6 mL/min — ABNORMAL LOW (ref >60)
Glucose, Bld: 85 mg/dL (ref 70–99)
Potassium: 3.6 mmol/L (ref 3.5–5.1)
Sodium: 132 mmol/L — ABNORMAL LOW (ref 135–145)

## 2021-08-28 LAB — MAGNESIUM: Magnesium: 1.7 mg/dL (ref 1.7–2.4)

## 2021-08-28 MED ORDER — HYDRALAZINE HCL 50 MG PO TABS: 50.0000 mg | ORAL_TABLET | Freq: Three times a day (TID) | ORAL | 0 refills | Status: DC | PRN

## 2021-08-28 MED ORDER — HYDRALAZINE HCL 50 MG PO TABS: 50.0000 mg | ORAL_TABLET | Freq: Three times a day (TID) | ORAL | Status: DC

## 2021-08-28 MED ORDER — HEPARIN SODIUM (PORCINE) 1000 UNIT/ML IJ SOLN
INTRAMUSCULAR | Status: AC
  Filled 2021-08-28: qty 10

## 2021-08-28 MED ORDER — HEPARIN SOD (PORK) LOCK FLUSH 100 UNIT/ML IV SOLN
500.0000 [IU] | INTRAVENOUS | Status: AC | PRN
  Administered 2021-08-28: 500 [IU]
  Filled 2021-08-28: qty 5

## 2021-08-28 MED ORDER — ABIRATERONE ACETATE 250 MG PO TABS
1000.0000 mg | ORAL_TABLET | Freq: Every day | ORAL | 0 refills | Status: DC
  Filled 2021-08-28: qty 72, 18d supply, fill #0
  Filled 2021-09-18: qty 48, 12d supply, fill #1

## 2021-08-28 NOTE — Discharge Summary (Signed)
Discharge Summary  Dylan Ho XBW:[id number] DOB: 1951-03-21  PCP: Kirk Ruths, MD  Admit date: 08/22/2021 Discharge date: 08/28/2021  Time spent: 57mins, more than 50% time spent on coordination of care. 57mins  Recommendations for Outpatient Follow-up:  F/u with PCP within a week  for hospital discharge follow up, repeat cbc/bmp at follow up F/u with nephrology, continue HD MWF, next on Friday F/u with oncology Dr Janese Banks, secure chat sent to Dr Janese Banks prior to discharge    Discharge Diagnoses:  Active Hospital Problems   Diagnosis Date Noted   AKI (acute kidney injury) ([place]) 08/22/2021    Resolved Hospital Problems  No resolved problems to display.    Discharge Condition: stable  Diet recommendation: Renal diet  Filed Weights   08/26/21 1234 08/28/21 1140 08/28/21 1525  Weight: 107.7 kg 107.2 kg 106 kg    History of present illness: (Per admitting MD Dr. Billie Ruddy) Chief Complaint: not eating or drinking   HPI: Dylan Ho is a 71 y.o. male with medical history significant of prostate cancer with mets to bone, recent hernia surgery, hypothyroidism who presented from cancer center clinic with Cr of 16.88.   Pt reported since his hernia surgery on 08/13/21, everything tasted bad to him, including water, and had reduced oral intake.  Wife reported since Monday (4 days ago), pt only had a few bites to eat and 1 bottle of supplement per day.  No urine output for the past few days as well, though pt complained of feeling urgency.  Pt has a current Rx for oxycodone and reported taking quite a bit of it.  Pt has vague discomfort in abdomen and inguinal areas.  Pt has chronic constipation and chronic dysuria.  No fever, dyspnea, chest pain, N/V/D, increased swelling.  This morning, wife noted increased confusion so took him to cancer center where lab work showed Cr of 16.88.   ED Course: initial vitals, afebrile, pulse 85, BP 129/71, sating 97% on room air.  Labs notable for Na  127, Cr 16.88 (baseline ~1.2), BUN 90, bicarb 17, normal WBC.  Pt reported having received about 1/2 liter of IVF at cancer center clinic.  Bladder scan in the ED showed 0 ml.  Hospital Course:  Principal Problem:   AKI (acute kidney injury) ([place])   Assessment and Plan: No notes have been filed under this hospital service. Service: Hospitalist   Dylan Ho is a 71 y.o. male with medical history significant of prostate cancer with mets to bone, recent hernia surgery, hypothyroidism who presented from cancer center clinic with Cr of 16.88.   # AKI, POA # Anuric  --Cr 16.88 (baseline ~1.2), presumed from severe dehydration. --started on temp dialysis on 2/17 --no significant urine output despite being on MIVF --PermCath placed Plan: --monitor urine output --received HD on 2/22, cleared to discharge by nephrology, he si to start outpatient dialysis on Friday.   # Acute metabolic encephalopathy 2/2 # Uremia  --wife noted increased confusion though seems intermittent.  BUN 90.  Also had change in sensation of taste.  All resolved after starting dialysis.   # Metabolic acidosis, resolved --2/2 AKI --d/c'ed bicarb gtt after bicarb level normalized   # Hyperkalemia, resolved --due to AKI --s/p Lokelma 5g x2 doses --dialysis for potassium removal  # Hyponatremia --likely due to volume depletion, improved with IVF and better oral hydration  # Prostate cancer with mets to bone --cont home Zytiga --cont home prednisone F/u with oncology Dr Janese Banks   #  Hypothyroidism --cont home Synthroid     # HTN --no BP meds on home med list.  BP has been elevated --increase hydralazine to 50 mg q8h (new med), expect bp will improve with HD, he agreed to d/c home on hydralazine prn -he is advised to check blood pressure at home and bring in record to hospital discharge follow up   # Obesity, BMI 33    Discharge Exam: BP (!) 173/84    Pulse 89    Temp 98.7 F (37.1 C) (Oral)    Resp 18     Ht 5\' 11"  (1.803 m)    Wt 106 kg    SpO2 94%    BMI 32.59 kg/m   General: NAD Cardiovascular: RRR Respiratory: CTABL    Discharge Instructions     Diet general   Complete by: As directed    Renal diet   Increase activity slowly   Complete by: As directed       Allergies as of 08/28/2021   No Known Allergies      Medication List     TAKE these medications    abiraterone acetate 250 MG tablet Commonly known as: ZYTIGA Take 4 tablets (1,000 mg total) by mouth daily. Take on an empty stomach 1 hour before or 2 hours after a meal   aspirin-acetaminophen-caffeine 250-250-65 MG tablet Commonly known as: EXCEDRIN MIGRAINE Take 2 tablets by mouth every 6 (six) hours as needed for headache.   hydrALAZINE 50 MG tablet Commonly known as: APRESOLINE Take 1 tablet (50 mg total) by mouth every 8 (eight) hours as needed (for sbp > 170).   HYDROcodone-acetaminophen 5-325 MG tablet Commonly known as: NORCO/VICODIN Take 1-2 tablets by mouth every 4 (four) hours as needed for moderate pain.   Lactulose 20 GM/30ML Soln Take 15-30 mLs (10-20 g total) by mouth daily as needed (constipation). What changed:  how much to take when to take this   levothyroxine 200 MCG tablet Commonly known as: SYNTHROID Take 200 mcg by mouth daily.   oxyCODONE 5 MG immediate release tablet Commonly known as: Oxy IR/ROXICODONE Take 1 tablet (5 mg total) by mouth every 4 (four) hours as needed for severe pain.   pantoprazole 40 MG tablet Commonly known as: Protonix Take 1 tablet (40 mg total) by mouth daily. What changed:  when to take this reasons to take this   predniSONE 5 MG tablet Commonly known as: DELTASONE Take 1 tablet (5 mg total) by mouth 2 (two) times daily with a meal.   solifenacin 10 MG tablet Commonly known as: VESICARE Take 1 tablet (10 mg total) by mouth daily.   traZODone 150 MG tablet Commonly known as: DESYREL Take 4 tablets (600 mg total) by mouth at  bedtime. What changed: how much to take       No Known Allergies  Follow-up Information     Kirk Ruths, MD Follow up in 1 week(s).   Specialty: Internal Medicine Why: hospital discharge follow up Contact information: [place] [id number] [phone]         Anthonette Legato, MD Follow up.   Specialty: Nephrology Why: for kidney failure and dialysis, please go to dialysis center MWF, next is on Friday Contact information: [place] [address] [phone]         Sindy Guadeloupe, MD Follow up.   Specialty: Oncology Contact information: [address] [place] [address]  [phone]                  The results of significant diagnostics from this hospitalization (including imaging, microbiology, ancillary and laboratory) are listed below for reference.    Significant Diagnostic Studies: DG Chest 1 View  Result Date: 08/26/2021 CLINICAL DATA:  Weakness and shortness of breath, received dialysis treatment today EXAM: CHEST  1 VIEW COMPARISON:  Portable exam 9518 hours compared to 11/22/2020 FINDINGS: RIGHT jugular Port-A-Cath tip projecting over SVC. LEFT jugular dual lumen central venous catheter with tip projecting over RIGHT atrium. Normal heart size, mediastinal contours, and pulmonary vascularity. Lungs clear. No infiltrate, pleural effusion, or pneumothorax. IMPRESSION: No acute abnormalities. Electronically Signed   By: Lavonia Dana M.D.   On: 08/26/2021 18:00   US Renal  Result Date: 08/22/2021 CLINICAL DATA:  Anuria , history of metastatic prostate cancer EXAM: RENAL / URINARY TRACT ULTRASOUND COMPLETE COMPARISON:  03/03/2017, 07/31/2021 FINDINGS: Right Kidney: Renal measurements: 11.4 x 6.2 by 6.0 cm = volume: 220 mL. Grossly normal right renal cortical echotexture. There is a simple 2.6 cm right renal cyst again noted. There is mild right hydronephrosis unchanged  since recent PET scan. Nonspecific 7 mm hyperechoic focus mid right kidney could reflect a cortical calcification. Left Kidney: Renal measurements: 11.2 x 8.0 x 7.3 cm = volume: 343 mL. Grossly normal left renal cortical echotexture. Mild left hydronephrosis unchanged since recent PET scan. No nephrolithiasis or renal mass. Bladder: Bladder is decompressed, and cannot be evaluated. Other: None. IMPRESSION: 1. Mild bilateral hydronephrosis, unchanged since recent PET scan. 2. Simple cyst right kidney. 3. Normal renal cortical echotexture. Electronically Signed   By: Randa Ngo M.D.   On: 08/22/2021 17:58   PERIPHERAL VASCULAR CATHETERIZATION  Result Date: 08/26/2021 See surgical note for result.  PERIPHERAL VASCULAR CATHETERIZATION  Result Date: 08/23/2021 See surgical note for result.  NM PET (PSMA) SKULL TO MID THIGH  Result Date: 08/01/2021 CLINICAL DATA:  71 year old male with metastatic prostate cancer. Bone metastasis. Increasing PSA. EXAM: NUCLEAR MEDICINE PET SKULL BASE TO THIGH TECHNIQUE: 9.5 mCi F18 Piflufolastat (Pylarify) was injected intravenously. Full-ring PET imaging was performed from the skull base to thigh after the radiotracer. CT data was obtained and used for attenuation correction and anatomic localization. COMPARISON:  None. FINDINGS: NECK No radiotracer activity in neck lymph nodes. Incidental CT finding: None CHEST No radiotracer accumulation within mediastinal or hilar lymph nodes. No suspicious pulmonary nodules on the CT scan. Incidental CT finding: Port in the anterior chest wall with tip in distal SVC. ABDOMEN/PELVIS Prostate: No focal activity in the prostate gland. Lymph nodes: No abnormal radiotracer accumulation within pelvic or abdominal nodes. Liver: No evidence of liver metastasis Incidental CT finding: RIGHT inguinal hernia contains a 4 cm segment of nonobstructed small bowel (image 250/3). Finding new from comparison CT. SKELETON Multiple foci of intense  radiotracer activity associated with a sclerotic skeletal metastasis. Example lesion on the RIGHT at the pubic symphysis with SUV max equal 26.4 (image 255). Large rounded sclerotic lesion in the L3 vertebral body measuring 2.1 cm (image 185/series 3) with SUV max equal 63.9. Several additional radiotracer avid lesions in the thoracic spine normal cervical spine. These lesions are also associated sclerosis on CT. For example lesion at T1 with SUV max equal 10.6. Many of the sclerotic lesions do not have radiotracer activity. Example lesion at L2 is sclerotic without radiotracer activity (image 173) IMPRESSION: 1. Multiple intensely radiotracer avid sclerotic lesions in the pelvis and spine consistent  with active prostate cancer skeletal metastasis. Findings similar to bone scan 04/12/2021. 2. There are multiple additional sclerotic lesions without radiotracer activity consistent with treated metastasis. 3. No evidence prostate cancer nodal metastasis or visceral metastasis. 4. New finding of a loop of small bowel entering a RIGHT inguinal hernia sac. No evidence obstruction but risk for incarceration. Electronically Signed   By: Suzy Bouchard M.D.   On: 08/01/2021 12:46    Microbiology: Recent Results (from the past 240 hour(s))  Resp Panel by RT-PCR (Flu A&B, Covid) Nasopharyngeal Swab     Status: None   Collection Time: 08/22/21  4:11 PM   Specimen: Nasopharyngeal Swab; Nasopharyngeal(NP) swabs in vial transport medium  Result Value Ref Range Status   SARS Coronavirus 2 by RT PCR NEGATIVE NEGATIVE Final    Comment: (NOTE) SARS-CoV-2 target nucleic acids are NOT DETECTED.  The SARS-CoV-2 RNA is generally detectable in upper respiratory specimens during the acute phase of infection. The lowest concentration of SARS-CoV-2 viral copies this assay can detect is 138 copies/mL. A negative result does not preclude SARS-Cov-2 infection and should not be used as the sole basis for treatment or other  patient management decisions. A negative result may occur with  improper specimen collection/handling, submission of specimen other than nasopharyngeal swab, presence of viral mutation(s) within the areas targeted by this assay, and inadequate number of viral copies(<138 copies/mL). A negative result must be combined with clinical observations, patient history, and epidemiological information. The expected result is Negative.  Fact Sheet for Patients:  EntrepreneurPulse.com.au  Fact Sheet for Healthcare Providers:  IncredibleEmployment.be  This test is no t yet approved or cleared by the [place] FDA and  has been authorized for detection and/or diagnosis of SARS-CoV-2 by FDA under an Emergency Use Authorization (EUA). This EUA will remain  in effect (meaning this test can be used) for the duration of the COVID-19 declaration under Section 564(b)(1) of the Act, 21 U.S.C.section 360bbb-3(b)(1), unless the authorization is terminated  or revoked sooner.       Influenza A by PCR NEGATIVE NEGATIVE Final   Influenza B by PCR NEGATIVE NEGATIVE Final    Comment: (NOTE) The Xpert Xpress SARS-CoV-2/FLU/RSV plus assay is intended as an aid in the diagnosis of influenza from Nasopharyngeal swab specimens and should not be used as a sole basis for treatment. Nasal washings and aspirates are unacceptable for Xpert Xpress SARS-CoV-2/FLU/RSV testing.  Fact Sheet for Patients: EntrepreneurPulse.com.au  Fact Sheet for Healthcare Providers: IncredibleEmployment.be  This test is not yet approved or cleared by the [place] FDA and has been authorized for detection and/or diagnosis of SARS-CoV-2 by FDA under an Emergency Use Authorization (EUA). This EUA will remain in effect (meaning this test can be used) for the duration of the COVID-19 declaration under Section 564(b)(1) of the Act, 21 U.S.C. section  360bbb-3(b)(1), unless the authorization is terminated or revoked.  Performed at [facility], [address] [place], [place] [address]      Labs: Basic Metabolic Panel: Recent Labs  Lab 08/24/21 0351 08/25/21 0645 08/26/21 0555 08/27/21 0545 08/28/21 0554  NA 131* 133* 132* 132* 132*  K 4.6 4.1 4.2 3.7 3.6  CL 93* 95* 95* 96* 96*  CO2 25 25 26 25 28   GLUCOSE 79 84 83 91 85  BUN 64* 42* 46* 27* 33*  CREATININE 12.87* 9.88* 11.23* 7.97* 9.49*  CALCIUM 8.0* 8.0* 8.1* 7.7* 7.8*  MG 1.9 1.8 1.8 1.6* 1.7   Liver Function Tests: Recent  Labs  Lab 08/22/21 1315  AST 18  ALT 6  ALKPHOS 73  BILITOT 0.6  PROT 5.8*  ALBUMIN 3.3*   No results for input(s): LIPASE, AMYLASE in the last 168 hours. No results for input(s): AMMONIA in the last 168 hours. CBC: Recent Labs  Lab 08/22/21 1532 08/23/21 0530 08/24/21 0351 08/25/21 0645 08/26/21 0555 08/27/21 0545 08/28/21 0554  WBC 4.2   < > 4.5 3.6* 4.1 4.6 4.5  NEUTROABS 3.3  --   --   --   --   --   --   HGB 10.5*   < > 10.0* 8.8* 9.2* 8.0* 7.6*  HCT 30.0*   < > 29.3* 25.6* 26.2* 23.4* 22.4*  MCV 91.7   < > 90.2 90.8 91.0 92.1 92.2  PLT 100*   < > 110* 96* 99* 100* 92*   < > = values in this interval not displayed.   Cardiac Enzymes: No results for input(s): CKTOTAL, CKMB, CKMBINDEX, TROPONINI in the last 168 hours. BNP: BNP (last 3 results) No results for input(s): BNP in the last 8760 hours.  ProBNP (last 3 results) No results for input(s): PROBNP in the last 8760 hours.  CBG: No results for input(s): GLUCAP in the last 168 hours.  FURTHER DISCHARGE INSTRUCTIONS:   Get Medicines reviewed and adjusted: Please take all your medications with you for your next visit with your Primary MD   Laboratory/radiological data: Please request your Primary MD to go over all hospital tests and procedure/radiological results at the follow up, please ask your Primary MD to get all Hospital records sent to his/her  office.   In some cases, they will be blood work, cultures and biopsy results pending at the time of your discharge. Please request that your primary care M.D. goes through all the records of your hospital data and follows up on these results.   Also Note the following: If you experience worsening of your admission symptoms, develop shortness of breath, life threatening emergency, suicidal or homicidal thoughts you must seek medical attention immediately by calling 911 or calling your MD immediately  if symptoms less severe.   You must read complete instructions/literature along with all the possible adverse reactions/side effects for all the Medicines you take and that have been prescribed to you. Take any new Medicines after you have completely understood and accpet all the possible adverse reactions/side effects.    Do not drive when taking Pain medications or sleeping medications (Benzodaizepines)   Do not take more than prescribed Pain, Sleep and Anxiety Medications. It is not advisable to combine anxiety,sleep and pain medications without talking with your primary care practitioner   Special Instructions: If you have smoked or chewed Tobacco  in the last 2 yrs please stop smoking, stop any regular Alcohol  and or any Recreational drug use.   Wear Seat belts while driving.   Please note: You were cared for by a hospitalist during your hospital stay. Once you are discharged, your primary care physician will handle any further medical issues. Please note that NO REFILLS for any discharge medications will be authorized once you are discharged, as it is imperative that you return to your primary care physician (or establish a relationship with a primary care physician if you do not have one) for your post hospital discharge needs so that they can reassess your need for medications and monitor your lab values.     Signed:  Florencia Reasons MD, PhD, FACP  [facility] 08/28/2021,  4:16  PM

## 2021-08-28 NOTE — Progress Notes (Signed)
[facility]  ROUNDING NOTE   Subjective:   Patient got admitted with acute kidney injury likely secondary to dehydration/hypovolemia.  Patient also reported to have uremic symptoms with the confusion poor appetite and drowsiness.  He was placed on urgent hemodialysis due to worsening renal status with elevated renal markers and azotemia. Temporary hemodialysis catheter in place.   Patient seen and evaluated during dialysis   HEMODIALYSIS FLOWSHEET:  Blood Flow Rate (mL/min): 400 mL/min Arterial Pressure (mmHg): -180 mmHg Venous Pressure (mmHg): 140 mmHg Transmembrane Pressure (mmHg): 60 mmHg Ultrafiltration Rate (mL/min): 500 mL/min Dialysate Flow Rate (mL/min): 500 ml/min Conductivity: Machine : 13.9 Conductivity: Machine : 13.9 Dialysis Fluid Bolus: Normal Saline Bolus Amount (mL): 250 mL  No complaints at this time  Objective:  Vital signs in last 24 hours:  Temp:  [97.9 F (36.6 C)-98.5 F (36.9 C)] 97.9 F (36.6 C) (02/22 1140) Pulse Rate:  [78-87] 87 (02/22 1345) Resp:  [9-25] 9 (02/22 1345) BP: (149-174)/(70-89) 152/81 (02/22 1152) SpO2:  [94 %-96 %] 94 % (02/22 1152) Weight:  [107.2 kg] 107.2 kg (02/22 1140)  Weight change:  Filed Weights   08/26/21 0909 08/26/21 1234 08/28/21 1140  Weight: 109.7 kg 107.7 kg 107.2 kg    Intake/Output: I/O last 3 completed shifts: In: 240 [P.O.:240] Out: -    Intake/Output this shift:  No intake/output data recorded.  Physical Exam: General: NAD  Head: Normocephalic, atraumatic. Moist oral mucosal membranes  Eyes: Anicteric  Lungs:  Clear to auscultation, normal effort  Heart: S1S2 no rubs  Abdomen:  Soft, nontender, bowel sounds present  Extremities:  1+ peripheral edema.  Neurologic: Awake, alert, following commands  Skin: No acute rash  Access:  left IJ PermCath placed on 08/26/2021 by Dr. Lucky Cowboy    Basic Metabolic Panel: Recent Labs  Lab 08/24/21 0351 08/25/21 0645 08/26/21 0555 08/27/21 0545  08/28/21 0554  NA 131* 133* 132* 132* 132*  K 4.6 4.1 4.2 3.7 3.6  CL 93* 95* 95* 96* 96*  CO2 25 25 26 25 28   GLUCOSE 79 84 83 91 85  BUN 64* 42* 46* 27* 33*  CREATININE 12.87* 9.88* 11.23* 7.97* 9.49*  CALCIUM 8.0* 8.0* 8.1* 7.7* 7.8*  MG 1.9 1.8 1.8 1.6* 1.7     Liver Function Tests: Recent Labs  Lab 08/22/21 1315  AST 18  ALT 6  ALKPHOS 73  BILITOT 0.6  PROT 5.8*  ALBUMIN 3.3*    No results for input(s): LIPASE, AMYLASE in the last 168 hours. No results for input(s): AMMONIA in the last 168 hours.  CBC: Recent Labs  Lab 08/22/21 1532 08/23/21 0530 08/24/21 0351 08/25/21 0645 08/26/21 0555 08/27/21 0545 08/28/21 0554  WBC 4.2   < > 4.5 3.6* 4.1 4.6 4.5  NEUTROABS 3.3  --   --   --   --   --   --   HGB 10.5*   < > 10.0* 8.8* 9.2* 8.0* 7.6*  HCT 30.0*   < > 29.3* 25.6* 26.2* 23.4* 22.4*  MCV 91.7   < > 90.2 90.8 91.0 92.1 92.2  PLT 100*   < > 110* 96* 99* 100* 92*   < > = values in this interval not displayed.     Cardiac Enzymes: No results for input(s): CKTOTAL, CKMB, CKMBINDEX, TROPONINI in the last 168 hours.  BNP: Invalid input(s): POCBNP  CBG: No results for input(s): GLUCAP in the last 168 hours.  Microbiology: Results for orders placed or performed during the hospital  encounter of 08/22/21  Resp Panel by RT-PCR (Flu A&B, Covid) Nasopharyngeal Swab     Status: None   Collection Time: 08/22/21  4:11 PM   Specimen: Nasopharyngeal Swab; Nasopharyngeal(NP) swabs in vial transport medium  Result Value Ref Range Status   SARS Coronavirus 2 by RT PCR NEGATIVE NEGATIVE Final    Comment: (NOTE) SARS-CoV-2 target nucleic acids are NOT DETECTED.  The SARS-CoV-2 RNA is generally detectable in upper respiratory specimens during the acute phase of infection. The lowest concentration of SARS-CoV-2 viral copies this assay can detect is 138 copies/mL. A negative result does not preclude SARS-Cov-2 infection and should not be used as the sole basis for  treatment or other patient management decisions. A negative result may occur with  improper specimen collection/handling, submission of specimen other than nasopharyngeal swab, presence of viral mutation(s) within the areas targeted by this assay, and inadequate number of viral copies(<138 copies/mL). A negative result must be combined with clinical observations, patient history, and epidemiological information. The expected result is Negative.  Fact Sheet for Patients:  EntrepreneurPulse.com.au  Fact Sheet for Healthcare Providers:  IncredibleEmployment.be  This test is no t yet approved or cleared by the [place] FDA and  has been authorized for detection and/or diagnosis of SARS-CoV-2 by FDA under an Emergency Use Authorization (EUA). This EUA will remain  in effect (meaning this test can be used) for the duration of the COVID-19 declaration under Section 564(b)(1) of the Act, 21 U.S.C.section 360bbb-3(b)(1), unless the authorization is terminated  or revoked sooner.       Influenza A by PCR NEGATIVE NEGATIVE Final   Influenza B by PCR NEGATIVE NEGATIVE Final    Comment: (NOTE) The Xpert Xpress SARS-CoV-2/FLU/RSV plus assay is intended as an aid in the diagnosis of influenza from Nasopharyngeal swab specimens and should not be used as a sole basis for treatment. Nasal washings and aspirates are unacceptable for Xpert Xpress SARS-CoV-2/FLU/RSV testing.  Fact Sheet for Patients: EntrepreneurPulse.com.au  Fact Sheet for Healthcare Providers: IncredibleEmployment.be  This test is not yet approved or cleared by the [place] FDA and has been authorized for detection and/or diagnosis of SARS-CoV-2 by FDA under an Emergency Use Authorization (EUA). This EUA will remain in effect (meaning this test can be used) for the duration of the COVID-19 declaration under Section 564(b)(1) of the Act, 21  U.S.C. section 360bbb-3(b)(1), unless the authorization is terminated or revoked.  Performed at [facility], [address] [place], [place] [address]     Coagulation Studies: No results for input(s): LABPROT, INR in the last 72 hours.  Urinalysis: No results for input(s): COLORURINE, LABSPEC, PHURINE, GLUCOSEU, HGBUR, BILIRUBINUR, KETONESUR, PROTEINUR, UROBILINOGEN, NITRITE, LEUKOCYTESUR in the last 72 hours.  Invalid input(s): APPERANCEUR    Imaging: DG Chest 1 View  Result Date: 08/26/2021 CLINICAL DATA:  Weakness and shortness of breath, received dialysis treatment today EXAM: CHEST  1 VIEW COMPARISON:  Portable exam 1734 hours compared to 11/22/2020 FINDINGS: RIGHT jugular Port-A-Cath tip projecting over SVC. LEFT jugular dual lumen central venous catheter with tip projecting over RIGHT atrium. Normal heart size, mediastinal contours, and pulmonary vascularity. Lungs clear. No infiltrate, pleural effusion, or pneumothorax. IMPRESSION: No acute abnormalities. Electronically Signed   By: Lavonia Dana M.D.   On: 08/26/2021 18:00     Medications:      abiraterone acetate  1,000 mg Oral Daily   Chlorhexidine Gluconate Cloth  6 each Topical Daily   darifenacin  15 mg Oral Daily  docusate sodium  100 mg Oral BID   heparin  5,000 Units Subcutaneous Q8H   hydrALAZINE  50 mg Oral Q8H   levothyroxine  200 mcg Oral Q0600   predniSONE  5 mg Oral BID WC   senna-docusate  1 tablet Oral BID   traZODone  150 mg Oral QHS   bisacodyl, ondansetron (ZOFRAN) IV, oxyCODONE  Assessment/ Plan:  71 y.o. male with past medical history of anemia, cirrhosis,hypertension,GERD, hypothyroidism and prostate cancer admitted to [facility] due to acute kidney injury.  Due to worsening renal status with markedly elevated creatinine level and oliguria patient was placed on hemodialysis.   Acute kidney injury with uremic symptoms likely secondary to dehydration/hypovolemia.  Patient was placed on  hemodialysis due to worsening renal markers and anuria.  Currently with a temporary hemodialysis catheter.  Dialysis initiated on 08/23/2021  Patient currently receiving hemodialysis, UF goal 1 to 1.5 L as tolerated.  Tolerating treatment well.  Appreciate dialysis coordinator confirming outpatient clinic of [facility] on a MWF schedule.  Patient and wife aware of start date, time of outpatient treatment.  Patient cleared to discharge after dialysis treatment today  Acute metabolic acidosis likely secondary to acute kidney injury.  Resolved   Prostate cancer with bone metastasis.  Continue home regimen including prednisone and Zytiga.       LOS: 6 Graelyn Bihl 2/22/20232:25 PM

## 2021-08-28 NOTE — Telephone Encounter (Signed)
CBC Order: [id number] Status: Final result    Visible to patient: No (scheduled for 08/28/2021  7:31 AM)    Next appt: 09/06/2021 at 10:00 AM in Oncology Sindy Guadeloupe, MD)    0 Result Notes           Component Ref Range & Units 05:54 1 d ago 2 d ago 3 d ago 4 d ago 5 d ago 6 d ago  WBC 4.0 - 10.5 K/uL 4.5  4.6  4.1  3.6 Low   4.5  4.3  4.2   RBC 4.22 - 5.81 MIL/uL 2.43 Low   2.54 Low   2.88 Low   2.82 Low   3.25 Low   3.34 Low   3.27 Low    Hemoglobin 13.0 - 17.0 g/dL 7.6 Low   8.0 Low   9.2 Low   8.8 Low   10.0 Low   10.4 Low   10.5 Low    HCT 39.0 - 52.0 % 22.4 Low   23.4 Low   26.2 Low   25.6 Low   29.3 Low   30.1 Low   30.0 Low    MCV 80.0 - 100.0 fL 92.2  92.1  91.0  90.8  90.2  90.1  91.7   MCH 26.0 - 34.0 pg 31.3  31.5  31.9  31.2  30.8  31.1  32.1   MCHC 30.0 - 36.0 g/dL 33.9  34.2  35.1  34.4  34.1  34.6  35.0   RDW 11.5 - 15.5 % 13.6  13.3  13.3  13.3  13.2  13.1  13.1   Platelets 150 - 400 K/uL 92 Low   100 Low  CM  99 Low  CM  96 Low  CM  110 Low  CM  106 Low  CM  100 Low    Comment: Immature Platelet Fraction may be  clinically indicated, consider  ordering this additional test  [id number]  REPEATED TO VERIFY   nRBC 0.0 - 0.2 % 0.0  0.0 CM  0.0 CM  0.0 CM  0.0 CM  0.0 CM  0.0   Comment: Performed at [facility], [address] [place], [place] [address]  Neutrophils Relative %        76 R   Basophils Absolute        0.0 R   Immature Granulocytes        2 R   Abs Immature Granulocytes        0.09 High  R, CM   Neutro Abs        3.3 R   Lymphocytes Relative        10 R   Lymphs Abs        0.4 Low  R   Monocytes Relative        9 R   Monocytes Absolute        0.4 R   Eosinophils Relative        2 R   Eosinophils Absolute        0.1 R   Basophils Relative        1 R   Resulting Agency  [place] [facility] [place] [facility] [place] [facility] [place] [facility] [place] [facility] [place] [facility] [place] [facility]         Specimen Collected: 08/28/21 05:54 Last Resulted: 08/28/21 06:31      Basic  metabolic panel Order: [id number] Status: Final result  Visible to patient: No (scheduled for 08/28/2021  7:29 AM)    Next appt: 09/06/2021 at 10:00 AM in Oncology Sindy Guadeloupe, MD)    0 Result Notes           Component Ref Range & Units 05:54 1 d ago 2 d ago 3 d ago 4 d ago 5 d ago 6 d ago  Sodium 135 - 145 mmol/L 132 Low   132 Low   132 Low   133 Low   131 Low   130 Low   127 Low    Potassium 3.5 - 5.1 mmol/L 3.6  3.7  4.2  4.1  4.6  5.6 High   4.6   Chloride 98 - 111 mmol/L 96 Low   96 Low   95 Low   95 Low   93 Low   96 Low   93 Low    CO2 22 - 32 mmol/L 28  25  26  25  25  19  Low   17 Low    Glucose, Bld 70 - 99 mg/dL 85  91 CM  83 CM  84 CM  79 CM  87 CM  92 CM   Comment: Glucose reference range applies only to samples taken after fasting for at least 8 hours.  BUN 8 - 23 mg/dL 33 High   27 High   46 High   42 High   64 High   92 High   90 High    Creatinine, Ser 0.61 - 1.24 mg/dL 9.49 High   7.97 High   11.23 High   9.88 High   12.87 High   17.03 High   16.88 High    Calcium 8.9 - 10.3 mg/dL 7.8 Low   7.7 Low   8.1 Low   8.0 Low   8.0 Low   8.0 Low   8.1 Low    GFR, Estimated >60 mL/min 6 Low   7 Low  CM  5 Low  CM  5 Low  CM  4 Low  CM  3 Low  CM  3 Low  CM   Comment: (NOTE)  Calculated using the CKD-EPI Creatinine Equation (2021)   Anion gap 5 - 15 8  11  CM  11 CM  13 CM  13 CM  15 CM  17 High  CM   Comment: Performed at [facility], [address] [place], [place] [address]  Resulting Agency  [facility] [place] [facility] [place] [facility] [place] [facility] [place] [facility] [place] [facility] [place] [facility]         Specimen Collected: 08/28/21 05:54 Last Resulted: 08/28/21 06:29    Magnesium Order: [id number] Status: Final result    Visible to patient: No (scheduled for 08/28/2021  7:29 AM)    Next appt: 09/06/2021 at 10:00 AM in Oncology Sindy Guadeloupe, MD)    0 Result Notes           Component Ref Range & Units 05:54 1 d ago 2 d ago 3 d ago 4 d ago 5 d ago 4 yr ago  Magnesium 1.7 - 2.4  mg/dL 1.7  1.6 Low  CM  1.8 CM  1.8 CM  1.9 CM  2.1 CM  1.9   Comment: Performed at [facility], [address] [place], [place] [address]  Resulting Agency  [facility] [place] [facility] [place] [facility] [place] [facility] [place] [facility] [place]  [facility] [place] [facility]         Specimen Collected: 08/28/21 05:54 Last Resulted: 08/28/21 06:29

## 2021-08-28 NOTE — Progress Notes (Signed)
D/C instructions reviewed with patient, verbalized understanding. Patient to be discharged home after dialysis. Port-a-cath to be deaccessed prior to discharge.

## 2021-08-28 NOTE — TOC Transition Note (Signed)
Transition of Care [facility]) - CM/SW Discharge Note   Patient Details  Name: Dylan Ho MRN: [id number] Date of Birth: 04-Jun-1951  [facility]) CM/SW Contact:  Beverly Sessions, RN Phone Number: 08/28/2021, 9:30 AM   Clinical Narrative:    Confirmed with nephrology that patient has confirmed outpatient HD time and has been provided with information from HD coordinator  No TOC needs identified for discharge      Barriers to Discharge: Continued Medical Work up   Patient Goals and CMS Choice        Discharge Placement                       Discharge Plan and Services                                     Social Determinants of Health (SDOH) Interventions     Readmission Risk Interventions Readmission Risk Prevention Plan 08/27/2021  Transportation Screening Complete  Social Work Consult for [place] Planning/Counseling [place] Not Applicable  Medication Review Press photographer) Complete  Some recent data might be hidden

## 2021-08-28 NOTE — Progress Notes (Signed)
Patient accepted at [facility] MWF 6:45am. Patient and wife are aware of start date, Friday 2/24 at 6:30am. Wife will transport.

## 2021-08-29 ENCOUNTER — Other Ambulatory Visit (HOSPITAL_COMMUNITY): Payer: Self-pay

## 2021-08-30 DIAGNOSIS — N189 Chronic kidney disease, unspecified: Secondary | ICD-10-CM | POA: Diagnosis not present

## 2021-08-30 DIAGNOSIS — N179 Acute kidney failure, unspecified: Secondary | ICD-10-CM | POA: Diagnosis not present

## 2021-09-02 DIAGNOSIS — N179 Acute kidney failure, unspecified: Secondary | ICD-10-CM | POA: Diagnosis not present

## 2021-09-04 ENCOUNTER — Telehealth: Payer: Self-pay | Admitting: *Deleted

## 2021-09-04 DIAGNOSIS — N179 Acute kidney failure, unspecified: Secondary | ICD-10-CM | POA: Diagnosis not present

## 2021-09-04 DIAGNOSIS — D509 Iron deficiency anemia, unspecified: Secondary | ICD-10-CM | POA: Diagnosis not present

## 2021-09-04 DIAGNOSIS — T82898A Other specified complication of vascular prosthetic devices, implants and grafts, initial encounter: Secondary | ICD-10-CM | POA: Diagnosis not present

## 2021-09-04 NOTE — Telephone Encounter (Signed)
Pt is now on dialysis [facility]. He has an appt on Friday and they wanted it in afternoon she has nothing he already has an appt on 3/13 so not sure he needed this one. Please advise.  ?

## 2021-09-06 ENCOUNTER — Inpatient Hospital Stay: Payer: PPO | Admitting: Oncology

## 2021-09-09 ENCOUNTER — Other Ambulatory Visit: Payer: Self-pay | Admitting: *Deleted

## 2021-09-09 MED ORDER — OXYCODONE HCL 5 MG PO TABS
5.0000 mg | ORAL_TABLET | ORAL | 0 refills | Status: DC | PRN
Start: 2021-09-09 — End: 2021-10-09

## 2021-09-11 ENCOUNTER — Encounter: Payer: PPO | Admitting: Surgery

## 2021-09-12 ENCOUNTER — Telehealth: Payer: Self-pay | Admitting: Urology

## 2021-09-12 NOTE — Telephone Encounter (Signed)
Error

## 2021-09-12 NOTE — Telephone Encounter (Signed)
Will you get some more information?  Looks like he had 0 cc in his bladder when hospitalized recently so not sure why he is being catheterized.  When was last time a catheter was placed?  Please find out if he is producing any urine or to see just note blood on his underwear? ?

## 2021-09-12 NOTE — Telephone Encounter (Signed)
Patient is having blood in his urine after he gets Dialysis. He has spoke to the Dialysis team about this problem and they are not giving him answers. He does not have a catheter and he does not do intermittent cathing. He states when he urinate he has blood. He wants to know if he should be seen for this or if it is normal. Should I schedule him an appointment?  ?

## 2021-09-12 NOTE — Telephone Encounter (Signed)
Patient called today concerned after seeing blood in his urine after starting weekly dialysis treatments recently. He said it started after he wold get cathed to. He wants to know if he needs an appointment for this? Please advise. ? ?Sharyn Lull ?

## 2021-09-13 ENCOUNTER — Encounter: Payer: Self-pay | Admitting: Oncology

## 2021-09-13 ENCOUNTER — Telehealth: Payer: Self-pay | Admitting: Urology

## 2021-09-13 DIAGNOSIS — R310 Gross hematuria: Secondary | ICD-10-CM

## 2021-09-13 NOTE — Telephone Encounter (Signed)
Pt LM on triage line stating that he has not received a call about his concerns. Please advise.  ?

## 2021-09-13 NOTE — Telephone Encounter (Signed)
Spoke to patient and he did not want to come in to drop off a urine until Tuesday 09/17/2021. Appointment scheduled and orders are in.  ?

## 2021-09-13 NOTE — Telephone Encounter (Signed)
Regarding his hematuria.  He had a cystoscopy in October which was negative.  Recommend he bring in a urine for UA/urine culture ?

## 2021-09-16 ENCOUNTER — Inpatient Hospital Stay: Payer: PPO | Admitting: Pharmacist

## 2021-09-16 ENCOUNTER — Inpatient Hospital Stay: Payer: PPO | Attending: Hospice and Palliative Medicine | Admitting: Nurse Practitioner

## 2021-09-16 ENCOUNTER — Inpatient Hospital Stay: Payer: PPO

## 2021-09-16 ENCOUNTER — Encounter: Payer: Self-pay | Admitting: Nurse Practitioner

## 2021-09-16 ENCOUNTER — Other Ambulatory Visit: Payer: Self-pay

## 2021-09-16 VITALS — BP 114/74 | HR 93 | Temp 98.2°F | Resp 16 | Ht 71.0 in | Wt 229.3 lb

## 2021-09-16 DIAGNOSIS — C61 Malignant neoplasm of prostate: Secondary | ICD-10-CM | POA: Diagnosis not present

## 2021-09-16 DIAGNOSIS — N186 End stage renal disease: Secondary | ICD-10-CM | POA: Diagnosis not present

## 2021-09-16 DIAGNOSIS — F32A Depression, unspecified: Secondary | ICD-10-CM | POA: Insufficient documentation

## 2021-09-16 DIAGNOSIS — Z801 Family history of malignant neoplasm of trachea, bronchus and lung: Secondary | ICD-10-CM | POA: Insufficient documentation

## 2021-09-16 DIAGNOSIS — Z803 Family history of malignant neoplasm of breast: Secondary | ICD-10-CM | POA: Diagnosis not present

## 2021-09-16 DIAGNOSIS — R319 Hematuria, unspecified: Secondary | ICD-10-CM | POA: Diagnosis not present

## 2021-09-16 DIAGNOSIS — N179 Acute kidney failure, unspecified: Secondary | ICD-10-CM | POA: Diagnosis not present

## 2021-09-16 DIAGNOSIS — C7951 Secondary malignant neoplasm of bone: Secondary | ICD-10-CM | POA: Diagnosis not present

## 2021-09-16 DIAGNOSIS — I10 Essential (primary) hypertension: Secondary | ICD-10-CM | POA: Insufficient documentation

## 2021-09-16 DIAGNOSIS — Z992 Dependence on renal dialysis: Secondary | ICD-10-CM | POA: Insufficient documentation

## 2021-09-16 DIAGNOSIS — Z8042 Family history of malignant neoplasm of prostate: Secondary | ICD-10-CM | POA: Diagnosis not present

## 2021-09-16 DIAGNOSIS — E876 Hypokalemia: Secondary | ICD-10-CM | POA: Diagnosis not present

## 2021-09-16 LAB — CBC WITH DIFFERENTIAL/PLATELET
Abs Immature Granulocytes: 0.01 10*3/uL (ref 0.00–0.07)
Basophils Absolute: 0 10*3/uL (ref 0.0–0.1)
Basophils Relative: 0 %
Eosinophils Absolute: 0 10*3/uL (ref 0.0–0.5)
Eosinophils Relative: 0 %
HCT: 26.6 % — ABNORMAL LOW (ref 39.0–52.0)
Hemoglobin: 8.9 g/dL — ABNORMAL LOW (ref 13.0–17.0)
Immature Granulocytes: 0 %
Lymphocytes Relative: 15 %
Lymphs Abs: 0.4 10*3/uL — ABNORMAL LOW (ref 0.7–4.0)
MCH: 33 pg (ref 26.0–34.0)
MCHC: 33.5 g/dL (ref 30.0–36.0)
MCV: 98.5 fL (ref 80.0–100.0)
Monocytes Absolute: 0.1 10*3/uL (ref 0.1–1.0)
Monocytes Relative: 5 %
Neutro Abs: 2 10*3/uL (ref 1.7–7.7)
Neutrophils Relative %: 80 %
Platelets: 182 10*3/uL (ref 150–400)
RBC: 2.7 MIL/uL — ABNORMAL LOW (ref 4.22–5.81)
RDW: 16.6 % — ABNORMAL HIGH (ref 11.5–15.5)
WBC: 2.6 10*3/uL — ABNORMAL LOW (ref 4.0–10.5)
nRBC: 0 % (ref 0.0–0.2)

## 2021-09-16 LAB — COMPREHENSIVE METABOLIC PANEL
ALT: 7 U/L (ref 0–44)
AST: 34 U/L (ref 15–41)
Albumin: 2.8 g/dL — ABNORMAL LOW (ref 3.5–5.0)
Alkaline Phosphatase: 93 U/L (ref 38–126)
Anion gap: 8 (ref 5–15)
BUN: 8 mg/dL (ref 8–23)
CO2: 31 mmol/L (ref 22–32)
Calcium: 7.6 mg/dL — ABNORMAL LOW (ref 8.9–10.3)
Chloride: 95 mmol/L — ABNORMAL LOW (ref 98–111)
Creatinine, Ser: 3.69 mg/dL — ABNORMAL HIGH (ref 0.61–1.24)
GFR, Estimated: 17 mL/min — ABNORMAL LOW (ref >60)
Glucose, Bld: 88 mg/dL (ref 70–99)
Potassium: 3 mmol/L — ABNORMAL LOW (ref 3.5–5.1)
Sodium: 134 mmol/L — ABNORMAL LOW (ref 135–145)
Total Bilirubin: 1.9 mg/dL — ABNORMAL HIGH (ref 0.3–1.2)
Total Protein: 5.3 g/dL — ABNORMAL LOW (ref 6.5–8.1)

## 2021-09-16 LAB — PSA: Prostatic Specific Antigen: 9.79 ng/mL — ABNORMAL HIGH (ref 0.00–4.00)

## 2021-09-16 NOTE — Progress Notes (Signed)
[place]  [phone] [phone]  Patient Care Team: Kirk Ruths, MD as PCP - General (Internal Medicine) Nickie Retort, MD (Inactive) as Consulting Physician (Urology) Lucilla Lame, MD as Consulting Physician (Gastroenterology) Lovell Sheehan, MD as Consulting Physician (Orthopedic Surgery) Sindy Guadeloupe, MD as Consulting Physician (Oncology)   Name of the patient: Dylan Ho  [id number]  May 02, 1951   Date of visit: 09/16/21  HPI: Patient is a 71 y.o. male with metastatic castration-resistant prostate cancer. Currently treated with Zytiga (abiraterone), prednisone, and ADT.  Reason for Consult: Oral chemotherapy follow-up for abiraterone therapy.   PAST MEDICAL HISTORY: Past Medical History:  Diagnosis Date   Anemia    Arthritis    knees, shoulders   CAD (coronary artery disease)    No Stents Present- per patient   Cancer ([place])    Cirrhosis ([place])    mild   Colon polyps    Essential hypertension 08/24/2008   GERD (gastroesophageal reflux disease)    patient denies   History of alcohol abuse Last Drink- 2011   History of gout    Hypertension    no longer on meds   Hypothyroidism    Intestinal ulcer    Iron deficiency anemia due to chronic blood loss 02/27/2017   Other pancytopenia ([place]) 02/25/2017   Overactive bladder    Prostate cancer (HCC)    Sleep apnea    improved since gastric bypass   Thyroid disease     HEMATOLOGY/ONCOLOGY HISTORY:  Oncology History  Prostate cancer metastatic to bone ([place])  11/16/2019 Initial Diagnosis   Prostate cancer metastatic to bone ([place])   12/16/2019 -  Chemotherapy   The patient had dexamethasone (DECADRON) 4 MG tablet, 8 mg, Oral, 2 times daily, 1 of 1 cycle, Start date: 11/16/2019, End date: -- pegfilgrastim (NEULASTA ONPRO KIT) injection 6 mg, 6 mg, Subcutaneous, Once, 6 of 6 cycles Administration: 6 mg (12/16/2019), 6 mg (01/06/2020), 6 mg  (01/27/2020), 6 mg (02/17/2020), 6 mg (03/09/2020), 6 mg (03/30/2020) DOCEtaxel (TAXOTERE) 180 mg in sodium chloride 0.9 % 250 mL chemo infusion, 75 mg/m2 = 180 mg, Intravenous,  Once, 6 of 6 cycles Administration: 180 mg (12/16/2019), 180 mg (01/06/2020), 180 mg (01/27/2020), 180 mg (02/17/2020), 180 mg (03/09/2020), 180 mg (03/30/2020)   for chemotherapy treatment.       ALLERGIES:  has No Known Allergies.  MEDICATIONS:  Current Outpatient Medications  Medication Sig Dispense Refill   abiraterone acetate (ZYTIGA) 250 MG tablet Take 4 tablets (1,000 mg total) by mouth daily. Take on an empty stomach 1 hour before or 2 hours after a meal 120 tablet 0   aspirin-acetaminophen-caffeine (EXCEDRIN MIGRAINE) 250-250-65 MG tablet Take 2 tablets by mouth every 6 (six) hours as needed for headache. (Patient not taking: Reported on 09/16/2021)     hydrALAZINE (APRESOLINE) 50 MG tablet Take 1 tablet (50 mg total) by mouth every 8 (eight) hours as needed (for sbp > 170). (Patient not taking: Reported on 09/16/2021) 30 tablet 0   HYDROcodone-acetaminophen (NORCO/VICODIN) 5-325 MG tablet Take 1-2 tablets by mouth every 4 (four) hours as needed for moderate pain. 15 tablet 0   Lactulose 20 GM/30ML SOLN Take 15-30 mLs (10-20 g total) by mouth daily as needed (constipation). (Patient taking differently: Take 30 mLs by mouth daily.) 450 mL 2   levothyroxine (SYNTHROID) 200 MCG tablet Take 200 mcg by mouth daily. (Patient not taking: Reported on 09/16/2021)     oxyCODONE (OXY IR/ROXICODONE)  5 MG immediate release tablet Take 1 tablet (5 mg total) by mouth every 4 (four) hours as needed for severe pain. (Patient not taking: Reported on 09/16/2021) 120 tablet 0   pantoprazole (PROTONIX) 40 MG tablet Take 1 tablet (40 mg total) by mouth daily. (Patient taking differently: Take 40 mg by mouth daily as needed (acid reflux).) 30 tablet 2   predniSONE (DELTASONE) 5 MG tablet Take 1 tablet (5 mg total) by mouth 2 (two) times daily with a  meal. 60 tablet 1   solifenacin (VESICARE) 10 MG tablet Take 1 tablet (10 mg total) by mouth daily. (Patient not taking: Reported on 09/16/2021) 30 tablet 1   traZODone (DESYREL) 150 MG tablet Take 4 tablets (600 mg total) by mouth at bedtime. (Patient taking differently: Take 450 mg by mouth at bedtime.) 360 tablet 1   No current facility-administered medications for this visit.   Facility-Administered Medications Ordered in Other Visits  Medication Dose Route Frequency Provider Last Rate Last Admin   sodium chloride flush (NS) 0.9 % injection 10 mL  10 mL Intravenous PRN Sindy Guadeloupe, MD   10 mL at 09/11/20 1138    VITAL SIGNS: There were no vitals taken for this visit. There were no vitals filed for this visit.  Estimated body mass index is 31.98 kg/m as calculated from the following:   Height as of an earlier encounter on 09/16/21: $RemoveBef'5\' 11"'[id number]$  (1.803 m).   Weight as of an earlier encounter on 09/16/21: 104 kg (229 lb 4.5 oz).  LABS: CBC:    Component Value Date/Time   WBC 2.6 (L) 09/16/2021 1347   HGB 8.9 (L) 09/16/2021 1347   HGB 11.1 (L) 02/19/2017 1111   HCT 26.6 (L) 09/16/2021 1347   HCT 35.8 (L) 02/19/2017 1111   PLT 182 09/16/2021 1347   PLT 99 (LL) 02/19/2017 1111   MCV 98.5 09/16/2021 1347   MCV 89 02/19/2017 1111   MCV 88 07/15/2013 1327   NEUTROABS 2.0 09/16/2021 1347   NEUTROABS 1.8 11/19/2016 1113   NEUTROABS 5.0 07/15/2013 1327   LYMPHSABS 0.4 (L) 09/16/2021 1347   LYMPHSABS 0.8 11/19/2016 1113   LYMPHSABS 1.6 07/15/2013 1327   MONOABS 0.1 09/16/2021 1347   MONOABS 0.5 07/15/2013 1327   EOSABS 0.0 09/16/2021 1347   EOSABS 0.1 11/19/2016 1113   EOSABS 0.1 07/15/2013 1327   BASOSABS 0.0 09/16/2021 1347   BASOSABS 0.0 11/19/2016 1113   BASOSABS 0.1 07/15/2013 1327   Comprehensive Metabolic Panel:    Component Value Date/Time   NA 134 (L) 09/16/2021 1347   NA 145 (H) 07/09/2017 1500   NA 138 07/15/2013 1327   K 3.0 (L) 09/16/2021 1347   K 4.2 07/15/2013  1327   CL 95 (L) 09/16/2021 1347   CL 105 07/15/2013 1327   CO2 31 09/16/2021 1347   CO2 24 07/15/2013 1327   BUN 8 09/16/2021 1347   BUN 11 07/09/2017 1500   BUN 12 07/15/2013 1327   CREATININE 3.69 (H) 09/16/2021 1347   CREATININE 1.08 07/15/2013 1327   GLUCOSE 88 09/16/2021 1347   GLUCOSE 78 07/15/2013 1327   CALCIUM 7.6 (L) 09/16/2021 1347   CALCIUM 8.8 07/15/2013 1327   AST 34 09/16/2021 1347   AST 35 07/15/2013 1327   ALT 7 09/16/2021 1347   ALT 28 07/15/2013 1327   ALKPHOS 93 09/16/2021 1347   ALKPHOS 111 07/15/2013 1327   BILITOT 1.9 (H) 09/16/2021 1347   BILITOT 0.4 07/09/2017 1500   BILITOT  0.5 07/15/2013 1327   PROT 5.3 (L) 09/16/2021 1347   PROT 6.0 07/09/2017 1500   PROT 7.5 07/15/2013 1327   ALBUMIN 2.8 (L) 09/16/2021 1347   ALBUMIN 4.0 07/09/2017 1500   ALBUMIN 3.9 07/15/2013 1327    Present during today's visit: patient and his wife  Assessment and Plan: Continue abiraterone 1047m daily Patient reporting fatigue, which could be multifactorial, consider potentially reducing down to 7575mdaily if fatigue continues   Oral Chemotherapy Side Effect/Intolerance:  Fatigue: see above comment  Other:  Constipation: this is a long term issue for the patient and predated abiraterone. Patient has been through many OTC products and only finds relief from lactulose.  Referral made to nutrition  Oral Chemotherapy Adherence: no missed doses reported No patient barriers to medication adherence identified.   New medications: None reported  Medication Access Issues: No issues, [facility] and was in agreement with this plan. He also understands that He can call clinic at any time with any questions, concerns, or complaints.   Follow-up plan: RTC in one month  Thank you for allowing me to participate in the care of this very pleasant patient.   Time Total: 15 mins  Visit consisted of counseling and  education on dealing with issues of symptom management in the setting of serious and potentially life-threatening illness.Greater than 50%  of this time was spent counseling and coordinating care related to the above assessment and plan.  Signed by: AlDarl PikesPharmD, BCPS, BCSalley SlaughterCPP Hematology/Oncology Clinical Pharmacist Practitioner [place]/DB/AP [phone]/13/2023 3:12 PM

## 2021-09-16 NOTE — Progress Notes (Signed)
Pt reports having no appetite; only drinking 1 boost/day.  ?

## 2021-09-16 NOTE — Progress Notes (Signed)
Hematology/Oncology Consult Note [facility]  [phone] [phone]  Patient Care Team: Kirk Ruths, MD as PCP - General (Internal Medicine) Nickie Retort, MD (Inactive) as Consulting Physician (Urology) Lucilla Lame, MD as Consulting Physician (Gastroenterology) Lovell Sheehan, MD as Consulting Physician (Orthopedic Surgery) Sindy Guadeloupe, MD as Consulting Physician (Oncology)   Name of the patient: Dylan Ho  [id number]  1951/03/22   Date of visit: 09/16/21  Diagnosis-metastatic castrate resistant prostate cancer  Chief complaint/ Reason for visit-discuss CT scan results and further management  Heme/Onc history:  Patient is a 71 year old male with a past medical history significant for alcohol-related cirrhosis.  He has not had any ascites or labs suggestive of chronic liver disease.  He does not drink alcohol anymore.  Patient was seen by scale for ongoing urinary symptoms of frequency especially at night.  This was followed by a CT renal stone study which showed cyst in the upper pole of the right kidney as well as left kidney.  S/p gastric bypass.  Pelvic node enlargement in the right pelvis 11 mm.  Bulky rounded lymph node along the external iliac chain 17 mm.  Prostate was enlarged measuring 4.3 cm.  Right obturator lymph node 1 cm.  Sclerosis of L3.  Left hemisacrum with sclerotic lesion.  Sclerosis of the superior pubic rami.  Large area of sclerosis affecting the right hemisacrum 4.7 x 2.4 cm.  Left ninth and 11th rib with sclerotic lesions compatible with bone metastases.  Right hip sclerosis.  PSA was elevated at 18.6.   Patient underwent 12 core prostate biopsy which showed adenocarcinoma Gleason score 9(4+5) grade group 5.    Patient is currently on Lupron and finished 6 cycles of docetaxel chemotherapy In September 2021.    Interval history-in the interim, patient was seen in symptom management clinic and  found to be in renal failure with creatinine of 16.88.  He was an uric.  Started on dialysis on 2/17.  PermCath was placed and he now receives hemodialysis outpatient.  Metabolic encephalopathy and confusion improved after starting dialysis.  Etiology of renal failure is unclear but thought to be related to dehydration and hypovolemia after hernia surgery. Continues to have poor appetite. Per wife, he's in the bed most of the day.    ECOG PS- 3 Pain scale- 0   Review of systems- Review of Systems  Constitutional:  Positive for malaise/fatigue. Negative for chills, fever and weight loss (poor appetite but weight is up).  HENT:  Negative for congestion, ear discharge and nosebleeds.   Eyes:  Negative for blurred vision.  Respiratory:  Negative for cough, hemoptysis, sputum production, shortness of breath and wheezing.   Cardiovascular:  Negative for chest pain, palpitations, orthopnea and claudication.  Gastrointestinal:  Negative for abdominal pain, blood in stool, constipation, diarrhea, heartburn, melena, nausea and vomiting.  Genitourinary:  Positive for hematuria. Negative for dysuria, flank pain, frequency and urgency.  Musculoskeletal:  Negative for back pain, joint pain and myalgias.  Skin:  Negative for rash.  Neurological:  Positive for weakness. Negative for dizziness, tingling, focal weakness, seizures and headaches.  Endo/Heme/Allergies:  Does not bruise/bleed easily.  Psychiatric/Behavioral:  Negative for depression and suicidal ideas. The patient does not have insomnia.      No Known Allergies   Past Medical History:  Diagnosis Date   Anemia    Arthritis    knees, shoulders   CAD (coronary artery disease)    No Stents Present- per  patient   Cancer ([place])    Cirrhosis ([place])    mild   Colon polyps    Essential hypertension 08/24/2008   GERD (gastroesophageal reflux disease)    patient denies   History of alcohol abuse Last Drink- 2011   History of gout     Hypertension    no longer on meds   Hypothyroidism    Intestinal ulcer    Iron deficiency anemia due to chronic blood loss 02/27/2017   Other pancytopenia ([place]) 02/25/2017   Overactive bladder    Prostate cancer ([place])    Sleep apnea    improved since gastric bypass   Thyroid disease      Past Surgical History:  Procedure Laterality Date   BILATERAL TOTAL SHOULDER ARTHROPLASTY  1990's   CIRCUMCISION REVISION N/A 03/13/2017   Procedure: CIRCUMCISION REVISION;  Surgeon: Nickie Retort, MD;  Location: [facility];  Service: Urology;  Laterality: N/A;   COLONOSCOPY WITH PROPOFOL N/A 10/30/2016   Procedure: COLONOSCOPY WITH PROPOFOL;  Surgeon: Lucilla Lame, MD;  Location: [facility];  Service: Endoscopy;  Laterality: N/A;   DIALYSIS/PERMA CATHETER INSERTION N/A 08/26/2021   Procedure: DIALYSIS/PERMA CATHETER INSERTION;  Surgeon: Algernon Huxley, MD;  Location: [place] [facility];  Service: Cardiovascular;  Laterality: N/A;   ESOPHAGOGASTRODUODENOSCOPY (EGD) WITH PROPOFOL N/A 10/30/2016   Procedure: ESOPHAGOGASTRODUODENOSCOPY (EGD) WITH PROPOFOL;  Surgeon: Lucilla Lame, MD;  Location: [facility];  Service: Endoscopy;  Laterality: N/A;   ESOPHAGOGASTRODUODENOSCOPY (EGD) WITH PROPOFOL N/A 03/26/2017   Procedure: ESOPHAGOGASTRODUODENOSCOPY (EGD) WITH PROPOFOL;  Surgeon: Lucilla Lame, MD;  Location: [place];  Service: Gastroenterology;  Laterality: N/A;  requests early   GASTRIC BYPASS  2016   Dr. Darnell Level- [facility] [place]   KNEE ARTHROSCOPY  1990s   KNEE ARTHROSCOPY W/ MENISCAL REPAIR Bilateral    KNEE ARTHROSCOPY WITH MEDIAL MENISECTOMY Left 04/21/2019   Procedure: KNEE ARTHROSCOPY WITH MEDIAL MENISECTOMY;  Surgeon: Lovell Sheehan, MD;  Location: [place];  Service: Orthopedics;  Laterality: Left;   PORTA CATH INSERTION N/A 11/23/2019   Procedure: PORTA CATH INSERTION;  Surgeon: Algernon Huxley, MD;  Location: [place] [facility];  Service: Cardiovascular;  Laterality: N/A;    TEMPORARY DIALYSIS CATHETER N/A 08/23/2021   Procedure: TEMPORARY DIALYSIS CATHETER;  Surgeon: Katha Cabal, MD;  Location: [place] [facility];  Service: Cardiovascular;  Laterality: N/A;   TONSILLECTOMY  2004   XI ROBOTIC ASSISTED INGUINAL HERNIA REPAIR WITH MESH Right 08/13/2021   Procedure: XI ROBOTIC ASSISTED INGUINAL HERNIA REPAIR WITH MESH, possible bilateral;  Surgeon: Jules Husbands, MD;  Location: [facility];  Service: General;  Laterality: Right;    Social History   Socioeconomic History   Marital status: Married    Spouse name: Not on file   Number of children: Not on file   Years of education: Not on file   Highest education level: Not on file  Occupational History   Not on file  Tobacco Use   Smoking status: Never   Smokeless tobacco: Current    Types: Chew  Vaping Use   Vaping Use: Never used  Substance and Sexual Activity   Alcohol use: No    Comment: Heavy ETOH in past- Last Drink 2011 per patient   Drug use: No   Sexual activity: Not Currently  Other Topics Concern   Not on file  Social History Narrative   Not on file   Social Determinants of Health   Financial Resource Strain: Not on file  Food Insecurity: Not on file  Transportation Needs: Not on file  Physical Activity: Not on file  Stress: Not on file  Social Connections: Not on file  Intimate Partner Violence: Not on file    Family History  Problem Relation Age of Onset   Stroke Mother    Breast cancer Mother    Heart disease Father    Stroke Father    AAA (abdominal aortic aneurysm) Father    Lung cancer Paternal Grandmother    Prostate cancer Brother    Bladder Cancer Neg Hx    Kidney cancer Neg Hx      Current Outpatient Medications:    abiraterone acetate (ZYTIGA) 250 MG tablet, Take 4 tablets (1,000 mg total) by mouth daily. Take on an empty stomach 1 hour before or 2 hours after a meal, Disp: 120 tablet, Rfl: 0   HYDROcodone-acetaminophen (NORCO/VICODIN) 5-325 MG tablet, Take  1-2 tablets by mouth every 4 (four) hours as needed for moderate pain., Disp: 15 tablet, Rfl: 0   Lactulose 20 GM/30ML SOLN, Take 15-30 mLs (10-20 g total) by mouth daily as needed (constipation). (Patient taking differently: Take 30 mLs by mouth daily.), Disp: 450 mL, Rfl: 2   pantoprazole (PROTONIX) 40 MG tablet, Take 1 tablet (40 mg total) by mouth daily. (Patient taking differently: Take 40 mg by mouth daily as needed (acid reflux).), Disp: 30 tablet, Rfl: 2   predniSONE (DELTASONE) 5 MG tablet, Take 1 tablet (5 mg total) by mouth 2 (two) times daily with a meal., Disp: 60 tablet, Rfl: 1   traZODone (DESYREL) 150 MG tablet, Take 4 tablets (600 mg total) by mouth at bedtime. (Patient taking differently: Take 450 mg by mouth at bedtime.), Disp: 360 tablet, Rfl: 1   aspirin-acetaminophen-caffeine (EXCEDRIN MIGRAINE) 250-250-65 MG tablet, Take 2 tablets by mouth every 6 (six) hours as needed for headache. (Patient not taking: Reported on 09/16/2021), Disp: , Rfl:    hydrALAZINE (APRESOLINE) 50 MG tablet, Take 1 tablet (50 mg total) by mouth every 8 (eight) hours as needed (for sbp > 170). (Patient not taking: Reported on 09/16/2021), Disp: 30 tablet, Rfl: 0   levothyroxine (SYNTHROID) 200 MCG tablet, Take 200 mcg by mouth daily. (Patient not taking: Reported on 09/16/2021), Disp: , Rfl:    oxyCODONE (OXY IR/ROXICODONE) 5 MG immediate release tablet, Take 1 tablet (5 mg total) by mouth every 4 (four) hours as needed for severe pain. (Patient not taking: Reported on 09/16/2021), Disp: 120 tablet, Rfl: 0   solifenacin (VESICARE) 10 MG tablet, Take 1 tablet (10 mg total) by mouth daily. (Patient not taking: Reported on 09/16/2021), Disp: 30 tablet, Rfl: 1 No current facility-administered medications for this visit.  Facility-Administered Medications Ordered in Other Visits:    sodium chloride flush (NS) 0.9 % injection 10 mL, 10 mL, Intravenous, PRN, Sindy Guadeloupe, MD, 10 mL at 09/11/20 1138  Physical exam:   Vitals:   09/16/21 1412  BP: 114/74  Pulse: 93  Resp: 16  Temp: 98.2 F (36.8 C)  SpO2: 99%  Weight: 229 lb 4.5 oz (104 kg)  Height: '5\' 11"'$  (1.803 m)   Physical Exam Constitutional:      General: He is not in acute distress.    Comments: Frail appearing. Accompanied.   Cardiovascular:     Rate and Rhythm: Normal rate and regular rhythm.     Heart sounds: Normal heart sounds.  Pulmonary:     Effort: Pulmonary effort is normal.  Abdominal:  General: There is no distension.     Tenderness: There is no abdominal tenderness.  Musculoskeletal:     Comments: In wheelchair.   Skin:    General: Skin is warm and dry.     Coloration: Skin is pale.  Neurological:     Mental Status: He is alert and oriented to person, place, and time.  Psychiatric:        Mood and Affect: Mood normal.        Behavior: Behavior normal.     CMP Latest Ref Rng & Units 08/28/2021  Glucose 70 - 99 mg/dL 85  BUN 8 - 23 mg/dL 33(H)  Creatinine 0.61 - 1.24 mg/dL 9.49(H)  Sodium 135 - 145 mmol/L 132(L)  Potassium 3.5 - 5.1 mmol/L 3.6  Chloride 98 - 111 mmol/L 96(L)  CO2 22 - 32 mmol/L 28  Calcium 8.9 - 10.3 mg/dL 7.8(L)  Total Protein 6.5 - 8.1 g/dL -  Total Bilirubin 0.3 - 1.2 mg/dL -  Alkaline Phos 38 - 126 U/L -  AST 15 - 41 U/L -  ALT 0 - 44 U/L -   CBC Latest Ref Rng & Units 09/16/2021  WBC 4.0 - 10.5 K/uL 2.6(L)  Hemoglobin 13.0 - 17.0 g/dL 8.9(L)  Hematocrit 39.0 - 52.0 % 26.6(L)  Platelets 150 - 400 K/uL 182   Lab Results  Component Value Date   PSA1 18.6 (H) 10/31/2019   Component Ref Range & Units 1 mo ago 2 mo ago 4 mo ago 5 mo ago 8 mo ago 11 mo ago 1 yr ago  Prostatic Specific Antigen 0.00 - 4.00 ng/mL 3.12  2.67 CM  0.41 CM  0.28 CM  0.29 CM  0.39 CM  0.44     No images are attached to the encounter.  DG Chest 1 View  Result Date: 08/26/2021 CLINICAL DATA:  Weakness and shortness of breath, received dialysis treatment today EXAM: CHEST  1 VIEW COMPARISON:  Portable exam  5465 hours compared to 11/22/2020 FINDINGS: RIGHT jugular Port-A-Cath tip projecting over SVC. LEFT jugular dual lumen central venous catheter with tip projecting over RIGHT atrium. Normal heart size, mediastinal contours, and pulmonary vascularity. Lungs clear. No infiltrate, pleural effusion, or pneumothorax. IMPRESSION: No acute abnormalities. Electronically Signed   By: Lavonia Dana M.D.   On: 08/26/2021 18:00   US Renal  Result Date: 08/22/2021 CLINICAL DATA:  Anuria , history of metastatic prostate cancer EXAM: RENAL / URINARY TRACT ULTRASOUND COMPLETE COMPARISON:  03/03/2017, 07/31/2021 FINDINGS: Right Kidney: Renal measurements: 11.4 x 6.2 by 6.0 cm = volume: 220 mL. Grossly normal right renal cortical echotexture. There is a simple 2.6 cm right renal cyst again noted. There is mild right hydronephrosis unchanged since recent PET scan. Nonspecific 7 mm hyperechoic focus mid right kidney could reflect a cortical calcification. Left Kidney: Renal measurements: 11.2 x 8.0 x 7.3 cm = volume: 343 mL. Grossly normal left renal cortical echotexture. Mild left hydronephrosis unchanged since recent PET scan. No nephrolithiasis or renal mass. Bladder: Bladder is decompressed, and cannot be evaluated. Other: None. IMPRESSION: 1. Mild bilateral hydronephrosis, unchanged since recent PET scan. 2. Simple cyst right kidney. 3. Normal renal cortical echotexture. Electronically Signed   By: Randa Ngo M.D.   On: 08/22/2021 17:58   PERIPHERAL VASCULAR CATHETERIZATION  Result Date: 08/26/2021 See surgical note for result.  PERIPHERAL VASCULAR CATHETERIZATION  Result Date: 08/23/2021 See surgical note for result.    Assessment and plan- Patient is a 71 y.o. male with  Metastatic castrate resistant prostate - PET scan showed progression of bony metastatic disease.  Previously noted prostate cancer lesions and bone are intensely hypermetabolic consistent with active prostate cancer.  PSA has been trending up  more recently 3.12 as compared to 0.28 several months ago.  Appears to have castrate resistant disease despite Lupron and docetaxel.  Plan for Zytiga and Lupron. Currently on zytiga + prednisone. Tolerating moderately though difficult to determine zytiga vs dialysis (see below). PSA pending at time of visit. Continue zytiga. Patient to meet with oral chemo pharmacist today for refill. Had previously recommended starting zometa/xjeva to reduce risk of future skeletal fractures. Hold today given hypocalcemia (see below) Inguinal hernia-incidental finding on PET scan.  Nonobstructed risk of incarceration. Underwent robot-assisted inguinal hernia repair with Dr. Dahlia Byes on 08/13/21.  Acute renal failure- now on hemodialysis M-W-F.  Hematuria- recommend he drop off urine at Dr. Dene Gentry as requested.  Taste Changes- likely secondary to zytiga. Weight is stable to increased. Albumin is low. Monitor.  Hypocalcemia- secondary to dialysis. Start oral calcium supplement 600 mg daily. \ Hypokalemia- follow up with dialysis.   1 mo- lab (cbc, cmp, psa), Dr. Janese Banks & Clearnce Sorrel, Lupron +/- xjeva   Visit Diagnosis 1. Prostate cancer metastatic to bone ([place])   2. Acute renal failure, unspecified acute renal failure type ([place])    Beckey Rutter, DNP, AGNP-C [place] at [facility] [phone] (clinic) 09/16/2021  CC: Dr. Janese Banks

## 2021-09-17 ENCOUNTER — Other Ambulatory Visit (HOSPITAL_COMMUNITY): Payer: Self-pay

## 2021-09-17 ENCOUNTER — Other Ambulatory Visit: Payer: PPO

## 2021-09-17 ENCOUNTER — Telehealth: Payer: Self-pay | Admitting: *Deleted

## 2021-09-17 NOTE — Telephone Encounter (Signed)
Patient called asking for lab results from yesterday ? ?Component Ref Range & Units 1 d ago 1 mo ago 2 mo ago 4 mo ago 5 mo ago 8 mo ago 11 mo ago  ?Prostatic Specific Antigen 0.00 - 4.00 ng/mL 9.79 High   3.12 CM  2.67 CM  0.41 CM      ? ?

## 2021-09-18 ENCOUNTER — Encounter: Payer: Self-pay | Admitting: Oncology

## 2021-09-18 ENCOUNTER — Other Ambulatory Visit (HOSPITAL_COMMUNITY): Payer: Self-pay

## 2021-09-19 ENCOUNTER — Other Ambulatory Visit (HOSPITAL_COMMUNITY): Payer: Self-pay

## 2021-09-23 ENCOUNTER — Other Ambulatory Visit (HOSPITAL_COMMUNITY): Payer: Self-pay

## 2021-09-24 ENCOUNTER — Other Ambulatory Visit (HOSPITAL_COMMUNITY): Payer: Self-pay

## 2021-09-24 ENCOUNTER — Other Ambulatory Visit: Payer: Self-pay | Admitting: Oncology

## 2021-09-24 DIAGNOSIS — C61 Malignant neoplasm of prostate: Secondary | ICD-10-CM

## 2021-09-24 MED ORDER — ABIRATERONE ACETATE 250 MG PO TABS
1000.0000 mg | ORAL_TABLET | Freq: Every day | ORAL | 0 refills | Status: AC
  Filled 2021-09-27: qty 120, 30d supply, fill #0

## 2021-09-24 NOTE — Telephone Encounter (Signed)
PSA ?Order: [id number] ?Status: Final result    ?Visible to patient: Yes (seen)    ?Next appt: 09/27/2021 at 03:00 PM in Oncology Dylan Guadeloupe, Dylan Ho)    ?Dx: Prostate cancer metastatic to bone [facility])    ?0 Result Notes ?          ?Component Ref Range & Units 8 d ago ?(09/16/21) 2 mo ago ?(07/23/21) 2 mo ago ?(07/12/21) 5 mo ago ?(04/22/21) 6 mo ago ?(03/25/21) 8 mo ago ?(01/15/21) 11 mo ago ?(10/15/20)  ?Prostatic Specific Antigen 0.00 - 4.00 ng/mL 9.79 High   3.12 CM  2.67 CM  0.41 CM  0.28 CM  0.29 CM  0.39 CM   ?Comment: (NOTE)  ?While PSA levels of <=4.0 ng/ml are reported as reference range, some  ?men with levels below 4.0 ng/ml can have prostate cancer and many men  ?with PSA above 4.0 ng/ml do not have prostate cancer.  Other tests  ?such as free PSA, age specific reference ranges, PSA velocity and PSA  ?doubling time may be helpful especially in men less than 2 years  ?old.  ?Performed at [facility], [place] [address], [place], [place]  ?[address]   ?Resulting Agency  [place] [facility] [place] [facility] [place] [facility] [place] [facility] [place] [facility] [place] [facility] [place] [facility]  ?  ? ?  ?  ?Specimen Collected: 09/16/21 13:47 Last Resulted: 09/16/21 20:01  ?  ?  Lab Flowsheet   ? Order Details   ? View Encounter   ? Lab and Collection Details   ? Routing   ? Result History    ?View Encounter Conversation    ?  ?CM=Additional comments    ?  ?Result Care Coordination ? ? ?Patient Communication ? ? Add Comments   Seen Back to Top  ?  ?  ? ?Other Results from 09/16/2021 ? ? Contains abnormal data Comprehensive metabolic panel ?Order: [id number] ?Status: Final result    ?Visible to patient: Yes (seen)    ?Next appt: 09/27/2021 at 03:00 PM in Oncology Dylan Guadeloupe, Dylan Ho)    ?Dx: Prostate cancer metastatic to bone [facility])    ?0 Result Notes ?          ?Component Ref Range & Units 8 d ago ?(09/16/21) 3 wk ago ?(08/28/21) 4 wk ago ?(08/27/21) 4 wk ago ?(08/26/21) 1 mo ago ?(08/25/21) 1 mo ago ?(08/24/21) 1 mo ago ?(08/23/21)  ?Sodium 135 - 145 mmol/L 134 Low    132 Low   132 Low   132 Low   133 Low   131 Low   130 Low    ?Potassium 3.5 - 5.1 mmol/L 3.0 Low   3.6  3.7  4.2  4.1  4.6  5.6 High    ?Chloride 98 - 111 mmol/L 95 Low   96 Low   96 Low   95 Low   95 Low   93 Low   96 Low    ?CO2 22 - 32 mmol/L '31  28  25  26  25  25  19 '$ Low    ?Glucose, Bld 70 - 99 mg/dL 88  85 CM  91 CM  83 CM  84 CM  79 CM  87 CM   ?Comment: Glucose reference range applies only to samples taken after fasting for at least 8 hours.  ?BUN 8 - 23 mg/dL 8  33 High   27 High   46 High   42  High   64 High   92 High    ?Creatinine, Ser 0.61 - 1.24 mg/dL 3.69 High   9.49 High   7.97 High   11.23 High   9.88 High   12.87 High   17.03 High    ?Calcium 8.9 - 10.3 mg/dL 7.6 Low   7.8 Low   7.7 Low   8.1 Low   8.0 Low   8.0 Low   8.0 Low    ?Total Protein 6.5 - 8.1 g/dL 5.3 Low          ?Albumin 3.5 - 5.0 g/dL 2.8 Low          ?AST 15 - 41 U/L 34         ?ALT 0 - 44 U/L 7         ?Alkaline Phosphatase 38 - 126 U/L 93         ?Total Bilirubin 0.3 - 1.2 mg/dL 1.9 High          ?GFR, Estimated >60 mL/min 17 Low   6 Low  CM  7 Low  CM  5 Low  CM  5 Low  CM  4 Low  CM  3 Low  CM   ?Comment: (NOTE)  ?Calculated using the CKD-EPI Creatinine Equation (2021)   ?Anion gap 5 - '15 8  8 '$ CM  11 CM  11 CM  13 CM  13 CM  15 CM   ?Comment: Performed at [facility], [address] [place],  [address]  ?Resulting Agency  [place] [facility] [place] [facility] [place] [facility] [place] [facility] [place] [facility] [place] [facility] [place] [facility]  ?  ? ?  ?  ?Specimen Collected: 09/16/21 13:47 Last Resulted: 09/16/21 14:23  ?  ?  Lab Flowsheet   ? Order Details   ? View Encounter   ? Lab and Collection Details   ? Routing   ? Result History    ?View Encounter Conversation    ?  ?CM=Additional comments    ?  ?Result Care Coordination ? ? ?Patient Communication ? ? Add Comments   Seen Back to Top  ?  ?  ? ?  ? Contains abnormal data CBC with Differential/Platelet ?Order: [id number] ?Status: Final result    ?Visible to patient: Yes (seen)    ?Next appt:  09/27/2021 at 03:00 PM in Oncology Dylan Guadeloupe, Dylan Ho)    ?Dx: Prostate cancer metastatic to bone [facility])    ?0 Result Notes ?          ?Component Ref Range & Units 8 d ago ?(09/16/21) 3 wk ago ?(08/28/21) 4 wk ago ?(08/27/21) 4 wk ago ?(08/26/21) 1 mo ago ?(08/25/21) 1 mo ago ?(08/24/21) 1 mo ago ?(08/23/21)  ?WBC 4.0 - 10.5 K/uL 2.6 Low   4.5  4.6  4.1  3.6 Low   4.5  4.3   ?RBC 4.22 - 5.81 MIL/uL 2.70 Low   2.43 Low   2.54 Low   2.88 Low   2.82 Low   3.25 Low   3.34 Low    ?Hemoglobin 13.0 - 17.0 g/dL 8.9 Low   7.6 Low   8.0 Low   9.2 Low   8.8 Low   10.0 Low   10.4 Low    ?HCT 39.0 - 52.0 % 26.6 Low   22.4 Low   23.4 Low   26.2 Low   25.6 Low   29.3 Low   30.1 Low    ?  MCV 80.0 - 100.0 fL 98.5  92.2  92.1  91.0  90.8  90.2  90.1   ?MCH 26.0 - 34.0 pg 33.0  31.3  31.5  31.9  31.2  30.8  31.1   ?MCHC 30.0 - 36.0 g/dL 33.5  33.9  34.2  35.1  34.4  34.1  34.6   ?RDW 11.5 - 15.5 % 16.6 High   13.6  13.3  13.3  13.3  13.2  13.1   ?Platelets 150 - 400 K/uL 182  92 Low  CM  100 Low  CM  99 Low  CM  96 Low  CM  110 Low  CM  106 Low  CM   ?nRBC 0.0 - 0.2 % 0.0  0.0 CM  0.0 CM  0.0 CM  0.0 CM  0.0 CM  0.0 CM   ?Neutrophils Relative % % 80         ?Neutro Abs 1.7 - 7.7 K/uL 2.0         ?Lymphocytes Relative % 15         ?Lymphs Abs 0.7 - 4.0 K/uL 0.4 Low          ?Monocytes Relative % 5         ?Monocytes Absolute 0.1 - 1.0 K/uL 0.1         ?Eosinophils Relative % 0         ?Eosinophils Absolute 0.0 - 0.5 K/uL 0.0         ?Basophils Relative % 0         ?Basophils Absolute 0.0 - 0.1 K/uL 0.0         ?Immature Granulocytes % 0         ?Abs Immature Granulocytes 0.00 - 0.07 K/uL 0.01         ?Comment: Performed at [facility], [address], [place], [place] [address]  ?Resulting Agency  [place] [facility] [place] [facility] [place] [facility] [place] [facility] [place] [facility] [place] [facility] [place] [facility]  ?  ? ?  ?  ?Specimen Collected: 09/16/21 13:47 Last Resulted: 09/16/21 14:16  ?  ?  ?  ? ?

## 2021-09-27 ENCOUNTER — Encounter: Payer: Self-pay | Admitting: Oncology

## 2021-09-27 ENCOUNTER — Other Ambulatory Visit (HOSPITAL_COMMUNITY): Payer: Self-pay

## 2021-09-27 ENCOUNTER — Inpatient Hospital Stay (HOSPITAL_BASED_OUTPATIENT_CLINIC_OR_DEPARTMENT_OTHER): Payer: PPO | Admitting: Oncology

## 2021-09-27 ENCOUNTER — Other Ambulatory Visit: Payer: Self-pay

## 2021-09-27 ENCOUNTER — Ambulatory Visit: Payer: PPO | Admitting: Oncology

## 2021-09-27 VITALS — BP 106/65 | HR 116 | Temp 100.0°F | Resp 18

## 2021-09-27 DIAGNOSIS — Z79899 Other long term (current) drug therapy: Secondary | ICD-10-CM

## 2021-09-27 DIAGNOSIS — C61 Malignant neoplasm of prostate: Secondary | ICD-10-CM

## 2021-09-27 DIAGNOSIS — R630 Anorexia: Secondary | ICD-10-CM | POA: Diagnosis not present

## 2021-09-27 DIAGNOSIS — N186 End stage renal disease: Secondary | ICD-10-CM

## 2021-09-27 DIAGNOSIS — C7951 Secondary malignant neoplasm of bone: Secondary | ICD-10-CM

## 2021-09-27 NOTE — Progress Notes (Signed)
pt has a urge to urinate but not able to go. per brother this has been a continous issue even before hospital admission. Also, was seen by a urologist last week but per brother visit did not provide many answers.  ?

## 2021-09-29 NOTE — H&P (View-Only) (Signed)
? ? ? ?Hematology/Oncology Consult note ?[place]  ?[phone]) [phone] [phone] ? ?Patient Care Team: ?Kirk Ruths, MD as PCP - General (Internal Medicine) ?Nickie Retort, MD (Inactive) as Consulting Physician (Urology) ?Lucilla Lame, MD as Consulting Physician (Gastroenterology) ?Lovell Sheehan, MD as Consulting Physician (Orthopedic Surgery) ?Sindy Guadeloupe, MD as Consulting Physician (Oncology)  ? ?Name of the patient: Dylan Ho  ?[id number]  ?Apr 11, 1951  ? ?Date of visit: 09/29/21 ? 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 ?Chief complaint/ Reason for visit-routine follow-up of prostate cancer on Zytiga ? ?Heme/Onc history: Patient is a 71 year old male with a past medical history significant for alcohol-related cirrhosis.  He has not had any ascites or labs suggestive of chronic liver disease.  He does not drink alcohol anymore.  Patient was seen by scale for ongoing urinary symptoms of frequency especially at night.  This was followed by a CT renal stone study which showed cyst in the upper pole of the right kidney as well as left kidney.  S/p gastric bypass.  Pelvic node enlargement in the right pelvis 11 mm.  Bulky rounded lymph node along the external iliac chain 17 mm.  Prostate was enlarged measuring 4.3 cm.  Right obturator lymph node 1 cm.  Sclerosis of L3.  Left hemisacrum with sclerotic lesion.  Sclerosis of the superior pubic rami.  Large area of sclerosis affecting the right hemisacrum 4.7 x 2.4 cm.  Left ninth and 11th rib with sclerotic lesions compatible with bone metastases.  Right hip sclerosis.  PSA was elevated at 18.6. ?  ?Patient underwent 12 core prostate biopsy which showed adenocarcinoma Gleason score 9(4+5) grade group 5.  ?  ?Patient is currently on Lupron and finished 6 cycles of docetaxel chemotherapy In September 2021. ?  ?  ? ?Interval history-patient feels poorly overall.  He is currently  on dialysis 3 times a week and reports having no energy postdialysis.  Appetite is fair.  He is tolerating Zytiga well ? ?ECOG PS- 2-3 ?Pain scale- 0 ? ? ?Review of systems- Review of Systems  ?Constitutional:  Positive for malaise/fatigue. Negative for chills, fever and weight loss.  ?     Loss of appetite  ?HENT:  Negative for congestion, ear discharge and nosebleeds.   ?Eyes:  Negative for blurred vision.  ?Respiratory:  Negative for cough, hemoptysis, sputum production, shortness of breath and wheezing.   ?Cardiovascular:  Negative for chest pain, palpitations, orthopnea and claudication.  ?Gastrointestinal:  Negative for abdominal pain, blood in stool, constipation, diarrhea, heartburn, melena, nausea and vomiting.  ?Genitourinary:  Negative for dysuria, flank pain, frequency, hematuria and urgency.  ?Musculoskeletal:  Negative for back pain, joint pain and myalgias.  ?Skin:  Negative for rash.  ?Neurological:  Negative for dizziness, tingling, focal weakness, seizures, weakness and headaches.  ?Endo/Heme/Allergies:  Does not bruise/bleed easily.  ?Psychiatric/Behavioral:  Positive for depression. Negative for suicidal ideas. The patient does not have insomnia.    ?No Known Allergies ? ? ?Past Medical History:  ?Diagnosis Date  ? Anemia   ? Arthritis   ? knees, shoulders  ? CAD (coronary artery disease)   ? No Stents Present- per patient  ? Cancer [facility])   ? Cirrhosis ([place])   ? mild  ? Colon polyps   ? Essential hypertension 08/24/2008  ? GERD (gastroesophageal reflux disease)   ? patient denies  ? History of alcohol abuse Last Drink- 2011  ? History of gout   ? Hypertension   ?  no longer on meds  ? Hypothyroidism   ? Intestinal ulcer   ? Iron deficiency anemia due to chronic blood loss 02/27/2017  ? Other pancytopenia ([place]) 02/25/2017  ? Overactive bladder   ? Prostate cancer ([place])   ? Sleep apnea   ? improved since gastric bypass  ? Thyroid disease   ? ? ? ?Past Surgical History:  ?Procedure Laterality Date  ?  BILATERAL TOTAL SHOULDER ARTHROPLASTY  1990's  ? CIRCUMCISION REVISION N/A 03/13/2017  ? Procedure: CIRCUMCISION REVISION;  Surgeon: Nickie Retort, MD;  Location: [facility];  Service: Urology;  Laterality: N/A;  ? COLONOSCOPY WITH PROPOFOL N/A 10/30/2016  ? Procedure: COLONOSCOPY WITH PROPOFOL;  Surgeon: Lucilla Lame, MD;  Location: [facility];  Service: Endoscopy;  Laterality: N/A;  ? DIALYSIS/PERMA CATHETER INSERTION N/A 08/26/2021  ? Procedure: DIALYSIS/PERMA CATHETER INSERTION;  Surgeon: Algernon Huxley, MD;  Location: [place] [facility];  Service: Cardiovascular;  Laterality: N/A;  ? ESOPHAGOGASTRODUODENOSCOPY (EGD) WITH PROPOFOL N/A 10/30/2016  ? Procedure: ESOPHAGOGASTRODUODENOSCOPY (EGD) WITH PROPOFOL;  Surgeon: Lucilla Lame, MD;  Location: [facility];  Service: Endoscopy;  Laterality: N/A;  ? ESOPHAGOGASTRODUODENOSCOPY (EGD) WITH PROPOFOL N/A 03/26/2017  ? Procedure: ESOPHAGOGASTRODUODENOSCOPY (EGD) WITH PROPOFOL;  Surgeon: Lucilla Lame, MD;  Location: [place];  Service: Gastroenterology;  Laterality: N/A;  requests early  ? GASTRIC BYPASS  2016  ? Dr. Nolon Rod, [place]  ? KNEE ARTHROSCOPY  1990s  ? KNEE ARTHROSCOPY W/ MENISCAL REPAIR Bilateral   ? KNEE ARTHROSCOPY WITH MEDIAL MENISECTOMY Left 04/21/2019  ? Procedure: KNEE ARTHROSCOPY WITH MEDIAL MENISECTOMY;  Surgeon: Lovell Sheehan, MD;  Location: [place];  Service: Orthopedics;  Laterality: Left;  ? PORTA CATH INSERTION N/A 11/23/2019  ? Procedure: PORTA CATH INSERTION;  Surgeon: Algernon Huxley, MD;  Location: [place] [facility];  Service: Cardiovascular;  Laterality: N/A;  ? TEMPORARY DIALYSIS CATHETER N/A 08/23/2021  ? Procedure: TEMPORARY DIALYSIS CATHETER;  Surgeon: Katha Cabal, MD;  Location: [place] [facility];  Service: Cardiovascular;  Laterality: N/A;  ? TONSILLECTOMY  2004  ? XI ROBOTIC ASSISTED INGUINAL HERNIA REPAIR WITH MESH Right 08/13/2021  ? Procedure: XI ROBOTIC ASSISTED INGUINAL HERNIA REPAIR WITH MESH,  possible bilateral;  Surgeon: Jules Husbands, MD;  Location: [facility];  Service: General;  Laterality: Right;  ? ? ?Social History  ? ?Socioeconomic History  ? Marital status: Married  ?  Spouse name: Not on file  ? Number of children: Not on file  ? Years of education: Not on file  ? Highest education level: Not on file  ?Occupational History  ? Not on file  ?Tobacco Use  ? Smoking status: Never  ? Smokeless tobacco: Current  ?  Types: Chew  ?Vaping Use  ? Vaping Use: Never used  ?Substance and Sexual Activity  ? Alcohol use: No  ?  Comment: Heavy ETOH in past- Last Drink 2011 per patient  ? Drug use: No  ? Sexual activity: Not Currently  ?Other Topics Concern  ? Not on file  ?Social History Narrative  ? Not on file  ? ?Social Determinants of Health  ? ?Financial Resource Strain: Not on file  ?Food Insecurity: Not on file  ?Transportation Needs: Not on file  ?Physical Activity: Not on file  ?Stress: Not on file  ?Social Connections: Not on file  ?Intimate Partner Violence: Not on file  ? ? ?Family History  ?Problem Relation Age of Onset  ? Stroke Mother   ? Breast cancer  Mother   ? Heart disease Father   ? Stroke Father   ? AAA (abdominal aortic aneurysm) Father   ? Lung cancer Paternal Grandmother   ? Prostate cancer Brother   ? Bladder Cancer Neg Hx   ? Kidney cancer Neg Hx   ? ? ? ?Current Outpatient Medications:  ?  abiraterone acetate (ZYTIGA) 250 MG tablet, Take 4 tablets (1,000 mg total) by mouth daily. Take on an empty stomach 1 hour before or 2 hours after a meal, Disp: 120 tablet, Rfl: 0 ?  calcium carbonate (OSCAL) 1500 (600 Ca) MG TABS tablet, Take by mouth 2 (two) times daily with a meal., Disp: , Rfl:  ?  HYDROcodone-acetaminophen (NORCO/VICODIN) 5-325 MG tablet, Take 1-2 tablets by mouth every 4 (four) hours as needed for moderate pain., Disp: 15 tablet, Rfl: 0 ?  Lactulose 20 GM/30ML SOLN, Take 15-30 mLs (10-20 g total) by mouth daily as needed (constipation). (Patient taking differently: Take 30  mLs by mouth daily.), Disp: 450 mL, Rfl: 2 ?  levothyroxine (SYNTHROID) 200 MCG tablet, Take 200 mcg by mouth daily., Disp: , Rfl:  ?  predniSONE (DELTASONE) 5 MG tablet, Take 1 tablet (5 mg total) by m

## 2021-09-29 NOTE — Progress Notes (Signed)
? ? ? ?Hematology/Oncology Consult note ?[place]  ?Telephone:(336) [phone] [phone] ? ?Patient Care Team: ?Kirk Ruths, MD as PCP - General (Internal Medicine) ?Nickie Retort, MD (Inactive) as Consulting Physician (Urology) ?Lucilla Lame, MD as Consulting Physician (Gastroenterology) ?Lovell Sheehan, MD as Consulting Physician (Orthopedic Surgery) ?Sindy Guadeloupe, MD as Consulting Physician (Oncology)  ? ?Name of the patient: Dylan Ho  ?[id number]  ?01/11/1951  ? ?Date of visit: 09/29/21 ? 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 ?Chief complaint/ Reason for visit-routine follow-up of prostate cancer on Zytiga ? ?Heme/Onc history: Patient is a 71 year old male with a past medical history significant for alcohol-related cirrhosis.  He has not had any ascites or labs suggestive of chronic liver disease.  He does not drink alcohol anymore.  Patient was seen by scale for ongoing urinary symptoms of frequency especially at night.  This was followed by a CT renal stone study which showed cyst in the upper pole of the right kidney as well as left kidney.  S/p gastric bypass.  Pelvic node enlargement in the right pelvis 11 mm.  Bulky rounded lymph node along the external iliac chain 17 mm.  Prostate was enlarged measuring 4.3 cm.  Right obturator lymph node 1 cm.  Sclerosis of L3.  Left hemisacrum with sclerotic lesion.  Sclerosis of the superior pubic rami.  Large area of sclerosis affecting the right hemisacrum 4.7 x 2.4 cm.  Left ninth and 11th rib with sclerotic lesions compatible with bone metastases.  Right hip sclerosis.  PSA was elevated at 18.6. ?  ?Patient underwent 12 core prostate biopsy which showed adenocarcinoma Gleason score 9(4+5) grade group 5.  ?  ?Patient is currently on Lupron and finished 6 cycles of docetaxel chemotherapy In September 2021. ?  ?  ? ?Interval history-patient feels poorly overall.  He is currently  on dialysis 3 times a week and reports having no energy postdialysis.  Appetite is fair.  He is tolerating Zytiga well ? ?ECOG PS- 2-3 ?Pain scale- 0 ? ? ?Review of systems- Review of Systems  ?Constitutional:  Positive for malaise/fatigue. Negative for chills, fever and weight loss.  ?     Loss of appetite  ?HENT:  Negative for congestion, ear discharge and nosebleeds.   ?Eyes:  Negative for blurred vision.  ?Respiratory:  Negative for cough, hemoptysis, sputum production, shortness of breath and wheezing.   ?Cardiovascular:  Negative for chest pain, palpitations, orthopnea and claudication.  ?Gastrointestinal:  Negative for abdominal pain, blood in stool, constipation, diarrhea, heartburn, melena, nausea and vomiting.  ?Genitourinary:  Negative for dysuria, flank pain, frequency, hematuria and urgency.  ?Musculoskeletal:  Negative for back pain, joint pain and myalgias.  ?Skin:  Negative for rash.  ?Neurological:  Negative for dizziness, tingling, focal weakness, seizures, weakness and headaches.  ?Endo/Heme/Allergies:  Does not bruise/bleed easily.  ?Psychiatric/Behavioral:  Positive for depression. Negative for suicidal ideas. The patient does not have insomnia.    ?No Known Allergies ? ? ?Past Medical History:  ?Diagnosis Date  ? Anemia   ? Arthritis   ? knees, shoulders  ? CAD (coronary artery disease)   ? No Stents Present- per patient  ? Cancer [facility])   ? Cirrhosis ([place])   ? mild  ? Colon polyps   ? Essential hypertension 08/24/2008  ? GERD (gastroesophageal reflux disease)   ? patient denies  ? History of alcohol abuse Last Drink- 2011  ? History of gout   ? Hypertension   ?  no longer on meds  ? Hypothyroidism   ? Intestinal ulcer   ? Iron deficiency anemia due to chronic blood loss 02/27/2017  ? Other pancytopenia ([place]) 02/25/2017  ? Overactive bladder   ? Prostate cancer ([place])   ? Sleep apnea   ? improved since gastric bypass  ? Thyroid disease   ? ? ? ?Past Surgical History:  ?Procedure Laterality Date  ?  BILATERAL TOTAL SHOULDER ARTHROPLASTY  1990's  ? CIRCUMCISION REVISION N/A 03/13/2017  ? Procedure: CIRCUMCISION REVISION;  Surgeon: Nickie Retort, MD;  Location: [facility];  Service: Urology;  Laterality: N/A;  ? COLONOSCOPY WITH PROPOFOL N/A 10/30/2016  ? Procedure: COLONOSCOPY WITH PROPOFOL;  Surgeon: Lucilla Lame, MD;  Location: [facility];  Service: Endoscopy;  Laterality: N/A;  ? DIALYSIS/PERMA CATHETER INSERTION N/A 08/26/2021  ? Procedure: DIALYSIS/PERMA CATHETER INSERTION;  Surgeon: Algernon Huxley, MD;  Location: [place] [facility];  Service: Cardiovascular;  Laterality: N/A;  ? ESOPHAGOGASTRODUODENOSCOPY (EGD) WITH PROPOFOL N/A 10/30/2016  ? Procedure: ESOPHAGOGASTRODUODENOSCOPY (EGD) WITH PROPOFOL;  Surgeon: Lucilla Lame, MD;  Location: [facility];  Service: Endoscopy;  Laterality: N/A;  ? ESOPHAGOGASTRODUODENOSCOPY (EGD) WITH PROPOFOL N/A 03/26/2017  ? Procedure: ESOPHAGOGASTRODUODENOSCOPY (EGD) WITH PROPOFOL;  Surgeon: Lucilla Lame, MD;  Location: [place];  Service: Gastroenterology;  Laterality: N/A;  requests early  ? GASTRIC BYPASS  2016  ? Dr. Nolon Rod, [place]  ? KNEE ARTHROSCOPY  1990s  ? KNEE ARTHROSCOPY W/ MENISCAL REPAIR Bilateral   ? KNEE ARTHROSCOPY WITH MEDIAL MENISECTOMY Left 04/21/2019  ? Procedure: KNEE ARTHROSCOPY WITH MEDIAL MENISECTOMY;  Surgeon: Lovell Sheehan, MD;  Location: [place];  Service: Orthopedics;  Laterality: Left;  ? PORTA CATH INSERTION N/A 11/23/2019  ? Procedure: PORTA CATH INSERTION;  Surgeon: Algernon Huxley, MD;  Location: [place] [facility];  Service: Cardiovascular;  Laterality: N/A;  ? TEMPORARY DIALYSIS CATHETER N/A 08/23/2021  ? Procedure: TEMPORARY DIALYSIS CATHETER;  Surgeon: Katha Cabal, MD;  Location: [place] [facility];  Service: Cardiovascular;  Laterality: N/A;  ? TONSILLECTOMY  2004  ? XI ROBOTIC ASSISTED INGUINAL HERNIA REPAIR WITH MESH Right 08/13/2021  ? Procedure: XI ROBOTIC ASSISTED INGUINAL HERNIA REPAIR WITH MESH,  possible bilateral;  Surgeon: Jules Husbands, MD;  Location: [facility];  Service: General;  Laterality: Right;  ? ? ?Social History  ? ?Socioeconomic History  ? Marital status: Married  ?  Spouse name: Not on file  ? Number of children: Not on file  ? Years of education: Not on file  ? Highest education level: Not on file  ?Occupational History  ? Not on file  ?Tobacco Use  ? Smoking status: Never  ? Smokeless tobacco: Current  ?  Types: Chew  ?Vaping Use  ? Vaping Use: Never used  ?Substance and Sexual Activity  ? Alcohol use: No  ?  Comment: Heavy ETOH in past- Last Drink 2011 per patient  ? Drug use: No  ? Sexual activity: Not Currently  ?Other Topics Concern  ? Not on file  ?Social History Narrative  ? Not on file  ? ?Social Determinants of Health  ? ?Financial Resource Strain: Not on file  ?Food Insecurity: Not on file  ?Transportation Needs: Not on file  ?Physical Activity: Not on file  ?Stress: Not on file  ?Social Connections: Not on file  ?Intimate Partner Violence: Not on file  ? ? ?Family History  ?Problem Relation Age of Onset  ? Stroke Mother   ? Breast cancer  Mother   ? Heart disease Father   ? Stroke Father   ? AAA (abdominal aortic aneurysm) Father   ? Lung cancer Paternal Grandmother   ? Prostate cancer Brother   ? Bladder Cancer Neg Hx   ? Kidney cancer Neg Hx   ? ? ? ?Current Outpatient Medications:  ?  abiraterone acetate (ZYTIGA) 250 MG tablet, Take 4 tablets (1,000 mg total) by mouth daily. Take on an empty stomach 1 hour before or 2 hours after a meal, Disp: 120 tablet, Rfl: 0 ?  calcium carbonate (OSCAL) 1500 (600 Ca) MG TABS tablet, Take by mouth 2 (two) times daily with a meal., Disp: , Rfl:  ?  HYDROcodone-acetaminophen (NORCO/VICODIN) 5-325 MG tablet, Take 1-2 tablets by mouth every 4 (four) hours as needed for moderate pain., Disp: 15 tablet, Rfl: 0 ?  Lactulose 20 GM/30ML SOLN, Take 15-30 mLs (10-20 g total) by mouth daily as needed (constipation). (Patient taking differently: Take 30  mLs by mouth daily.), Disp: 450 mL, Rfl: 2 ?  levothyroxine (SYNTHROID) 200 MCG tablet, Take 200 mcg by mouth daily., Disp: , Rfl:  ?  predniSONE (DELTASONE) 5 MG tablet, Take 1 tablet (5 mg total) by m

## 2021-10-02 ENCOUNTER — Other Ambulatory Visit (HOSPITAL_COMMUNITY): Payer: Self-pay

## 2021-10-03 ENCOUNTER — Encounter: Payer: Self-pay | Admitting: Oncology

## 2021-10-07 ENCOUNTER — Telehealth: Payer: Self-pay | Admitting: *Deleted

## 2021-10-07 ENCOUNTER — Other Ambulatory Visit: Payer: Self-pay

## 2021-10-07 DIAGNOSIS — Z992 Dependence on renal dialysis: Secondary | ICD-10-CM | POA: Diagnosis not present

## 2021-10-07 DIAGNOSIS — T82868A Thrombosis of vascular prosthetic devices, implants and grafts, initial encounter: Secondary | ICD-10-CM | POA: Diagnosis not present

## 2021-10-07 DIAGNOSIS — N179 Acute kidney failure, unspecified: Secondary | ICD-10-CM | POA: Diagnosis not present

## 2021-10-07 DIAGNOSIS — N184 Chronic kidney disease, stage 4 (severe): Secondary | ICD-10-CM | POA: Diagnosis not present

## 2021-10-07 DIAGNOSIS — D509 Iron deficiency anemia, unspecified: Secondary | ICD-10-CM | POA: Diagnosis not present

## 2021-10-07 DIAGNOSIS — D631 Anemia in chronic kidney disease: Secondary | ICD-10-CM | POA: Diagnosis not present

## 2021-10-07 DIAGNOSIS — C61 Malignant neoplasm of prostate: Secondary | ICD-10-CM

## 2021-10-07 DIAGNOSIS — Z23 Encounter for immunization: Secondary | ICD-10-CM | POA: Diagnosis not present

## 2021-10-07 DIAGNOSIS — N186 End stage renal disease: Secondary | ICD-10-CM | POA: Diagnosis not present

## 2021-10-07 NOTE — Telephone Encounter (Signed)
I faxed the papers and put the order in dme in epic for rolling walker with seat ?

## 2021-10-09 ENCOUNTER — Other Ambulatory Visit: Payer: Self-pay | Admitting: Oncology

## 2021-10-09 DIAGNOSIS — N179 Acute kidney failure, unspecified: Secondary | ICD-10-CM | POA: Diagnosis not present

## 2021-10-09 DIAGNOSIS — R531 Weakness: Secondary | ICD-10-CM | POA: Diagnosis not present

## 2021-10-09 DIAGNOSIS — C7951 Secondary malignant neoplasm of bone: Secondary | ICD-10-CM | POA: Diagnosis not present

## 2021-10-10 ENCOUNTER — Encounter: Payer: Self-pay | Admitting: Oncology

## 2021-10-10 ENCOUNTER — Telehealth (INDEPENDENT_AMBULATORY_CARE_PROVIDER_SITE_OTHER): Payer: Self-pay

## 2021-10-10 MED ORDER — OXYCODONE HCL 5 MG PO TABS: 5.0000 mg | ORAL_TABLET | ORAL | 0 refills | Status: DC | PRN

## 2021-10-10 NOTE — Telephone Encounter (Signed)
Spoke with the patient's spouse and he is scheduled with Dr. Lucky Cowboy for a permcath exchange on 10/17/21 with a 9:30 am arrival time to the MM. Pre-procedure instructions were discussed and will be mailed. ?

## 2021-10-11 ENCOUNTER — Telehealth: Payer: Self-pay

## 2021-10-11 NOTE — Telephone Encounter (Signed)
Reached out to pt in regards to his rolling walker, checking to see if he has recieved it before following up with [employer]. Pt did not answer, left VM for pt to return call. ?

## 2021-10-14 ENCOUNTER — Other Ambulatory Visit: Payer: PPO

## 2021-10-14 ENCOUNTER — Ambulatory Visit: Payer: PPO | Admitting: Pharmacist

## 2021-10-14 ENCOUNTER — Ambulatory Visit: Payer: PPO

## 2021-10-14 ENCOUNTER — Ambulatory Visit: Payer: PPO | Admitting: Oncology

## 2021-10-16 ENCOUNTER — Other Ambulatory Visit: Payer: Self-pay | Admitting: Nephrology

## 2021-10-16 DIAGNOSIS — N186 End stage renal disease: Secondary | ICD-10-CM

## 2021-10-17 ENCOUNTER — Ambulatory Visit
Admission: RE | Admit: 2021-10-17 | Discharge: 2021-10-17 | Disposition: A | Discharge status: Home / Self Care | Payer: PPO | Attending: Vascular Surgery | Admitting: Vascular Surgery

## 2021-10-17 ENCOUNTER — Other Ambulatory Visit: Payer: Self-pay

## 2021-10-17 ENCOUNTER — Encounter: Payer: Self-pay | Admitting: Vascular Surgery

## 2021-10-17 ENCOUNTER — Encounter
Admission: RE | Disposition: A | Discharge status: Home / Self Care | Payer: Self-pay | Source: Home / Self Care | Attending: Vascular Surgery

## 2021-10-17 DIAGNOSIS — C61 Malignant neoplasm of prostate: Secondary | ICD-10-CM | POA: Insufficient documentation

## 2021-10-17 DIAGNOSIS — F17220 Nicotine dependence, chewing tobacco, uncomplicated: Secondary | ICD-10-CM | POA: Diagnosis not present

## 2021-10-17 DIAGNOSIS — I120 Hypertensive chronic kidney disease with stage 5 chronic kidney disease or end stage renal disease: Secondary | ICD-10-CM | POA: Diagnosis not present

## 2021-10-17 DIAGNOSIS — T8249XA Other complication of vascular dialysis catheter, initial encounter: Secondary | ICD-10-CM | POA: Diagnosis not present

## 2021-10-17 DIAGNOSIS — N186 End stage renal disease: Secondary | ICD-10-CM | POA: Insufficient documentation

## 2021-10-17 DIAGNOSIS — Z992 Dependence on renal dialysis: Secondary | ICD-10-CM | POA: Diagnosis not present

## 2021-10-17 DIAGNOSIS — R630 Anorexia: Secondary | ICD-10-CM | POA: Diagnosis not present

## 2021-10-17 DIAGNOSIS — C7951 Secondary malignant neoplasm of bone: Secondary | ICD-10-CM | POA: Insufficient documentation

## 2021-10-17 HISTORY — PX: DIALYSIS/PERMA CATHETER INSERTION: CATH118288

## 2021-10-17 LAB — BASIC METABOLIC PANEL
Anion gap: 9 (ref 5–15)
BUN: 19 mg/dL (ref 8–23)
CO2: 28 mmol/L (ref 22–32)
Calcium: 7.7 mg/dL — ABNORMAL LOW (ref 8.9–10.3)
Chloride: 100 mmol/L (ref 98–111)
Creatinine, Ser: 4.33 mg/dL — ABNORMAL HIGH (ref 0.61–1.24)
GFR, Estimated: 14 mL/min — ABNORMAL LOW (ref >60)
Glucose, Bld: 90 mg/dL (ref 70–99)
Potassium: 2.2 mmol/L — CL (ref 3.5–5.1)
Sodium: 137 mmol/L (ref 135–145)

## 2021-10-17 LAB — POTASSIUM (ARMC VASCULAR LAB ONLY): Potassium (ARMC vascular lab): 2.1 mmol/L — CL (ref 3.5–5.1)

## 2021-10-17 SURGERY — DIALYSIS/PERMA CATHETER INSERTION
Anesthesia: Moderate Sedation

## 2021-10-17 MED ORDER — ONDANSETRON HCL 4 MG/2ML IJ SOLN: 4.0000 mg | Freq: Four times a day (QID) | INTRAMUSCULAR | Status: DC | PRN

## 2021-10-17 MED ORDER — MIDAZOLAM HCL 2 MG/2ML IJ SOLN
INTRAMUSCULAR | Status: AC
  Filled 2021-10-17: qty 4

## 2021-10-17 MED ORDER — MIDAZOLAM HCL 2 MG/2ML IJ SOLN
INTRAMUSCULAR | Status: DC | PRN
  Administered 2021-10-17: 2 mg via INTRAVENOUS

## 2021-10-17 MED ORDER — POTASSIUM CHLORIDE 10 MEQ/100ML IV SOLN
10.0000 meq | INTRAVENOUS | Status: DC
  Filled 2021-10-17 (×4): qty 100

## 2021-10-17 MED ORDER — POTASSIUM CHLORIDE 10 MEQ/100ML IV SOLN
10.0000 meq | INTRAVENOUS | Status: AC
  Administered 2021-10-17 (×2): 10 meq via INTRAVENOUS
  Filled 2021-10-17: qty 100

## 2021-10-17 MED ORDER — FENTANYL CITRATE (PF) 100 MCG/2ML IJ SOLN
INTRAMUSCULAR | Status: DC | PRN
  Administered 2021-10-17: 50 ug via INTRAVENOUS

## 2021-10-17 MED ORDER — POTASSIUM CHLORIDE CRYS ER 20 MEQ PO TBCR: 40.0000 meq | EXTENDED_RELEASE_TABLET | Freq: Once | ORAL | Status: AC

## 2021-10-17 MED ORDER — FENTANYL CITRATE PF 50 MCG/ML IJ SOSY
PREFILLED_SYRINGE | INTRAMUSCULAR | Status: AC
  Filled 2021-10-17: qty 2

## 2021-10-17 MED ORDER — SODIUM CHLORIDE 0.9 % IV SOLN: INTRAVENOUS | Status: DC

## 2021-10-17 MED ORDER — MIDAZOLAM HCL 2 MG/ML PO SYRP: 8.0000 mg | ORAL_SOLUTION | Freq: Once | ORAL | Status: DC | PRN

## 2021-10-17 MED ORDER — POTASSIUM CHLORIDE CRYS ER 20 MEQ PO TBCR
EXTENDED_RELEASE_TABLET | ORAL | Status: AC
  Administered 2021-10-17: 40 meq via ORAL
  Filled 2021-10-17: qty 2

## 2021-10-17 MED ORDER — CEFAZOLIN SODIUM-DEXTROSE 1-4 GM/50ML-% IV SOLN: 1.0000 g | Freq: Once | INTRAVENOUS | Status: AC

## 2021-10-17 MED ORDER — HEPARIN SODIUM (PORCINE) 10000 UNIT/ML IJ SOLN
INTRAMUSCULAR | Status: DC
Start: 2021-10-17 — End: 2021-10-17
  Filled 2021-10-17: qty 1

## 2021-10-17 MED ORDER — DIPHENHYDRAMINE HCL 50 MG/ML IJ SOLN: 50.0000 mg | Freq: Once | INTRAMUSCULAR | Status: DC | PRN

## 2021-10-17 MED ORDER — HYDROMORPHONE HCL 1 MG/ML IJ SOLN: 1.0000 mg | Freq: Once | INTRAMUSCULAR | Status: DC | PRN

## 2021-10-17 MED ORDER — METHYLPREDNISOLONE SODIUM SUCC 125 MG IJ SOLR
125.0000 mg | Freq: Once | INTRAMUSCULAR | Status: DC | PRN
Start: 2021-10-17 — End: 2021-10-17

## 2021-10-17 MED ORDER — FAMOTIDINE 20 MG PO TABS: 40.0000 mg | ORAL_TABLET | Freq: Once | ORAL | Status: DC | PRN

## 2021-10-17 MED ORDER — CEFAZOLIN SODIUM-DEXTROSE 1-4 GM/50ML-% IV SOLN
INTRAVENOUS | Status: AC
  Administered 2021-10-17: 1 g via INTRAVENOUS
  Filled 2021-10-17: qty 50

## 2021-10-17 SURGICAL SUPPLY — 6 items
BIOPATCH RED 1 DISK 7.0 (GAUZE/BANDAGES/DRESSINGS) ×1 IMPLANT
CATH PALINDROME-SP 14.5FX23 (CATHETERS) ×1 IMPLANT
GUIDEWIRE SUPER STIFF .035X180 (WIRE) ×1 IMPLANT
PACK ANGIOGRAPHY (CUSTOM PROCEDURE TRAY) ×1 IMPLANT
SUT MNCRL AB 4-0 PS2 18 (SUTURE) ×1 IMPLANT
SUT PROLENE 0 CT 1 30 (SUTURE) ×1 IMPLANT

## 2021-10-17 NOTE — Progress Notes (Signed)
This RN informed Dr. Lucky Cowboy of patient's potassium level. See new MAR orders.  ?

## 2021-10-17 NOTE — Interval H&P Note (Signed)
History and Physical Interval Note: ? ?10/17/2021 ?10:00 AM ? ?Dylan Ho  has presented today for surgery, with the diagnosis of Perma Cath Exchange   End Stage Renal.  The various methods of treatment have been discussed with the patient and family. After consideration of risks, benefits and other options for treatment, the patient has consented to  Procedure(s): ?DIALYSIS/PERMA CATHETER INSERTION (N/A) as a surgical intervention.  The patient's history has been reviewed, patient examined, no change in status, stable for surgery.  I have reviewed the patient's chart and labs.  Questions were answered to the patient's satisfaction.   ? ? ?Leotis Pain ? ? ?

## 2021-10-17 NOTE — Op Note (Signed)
OPERATIVE NOTE ? ? ? ?PRE-OPERATIVE DIAGNOSIS: 1. ESRD ?2. Non-functional permcath ? ?POST-OPERATIVE DIAGNOSIS: same as above ? ?PROCEDURE: ?Fluoroscopic guidance for placement of catheter ?Placement of a 23 cm tip to cuff tunneled hemodialysis catheter via the left internal jugular vein and removal of previous catheter ? ?SURGEON: Leotis Pain, MD ? ?ANESTHESIA:  Local with moderate conscious sedation for 16 minutes using 2 mg of Versed and 50 mcg of Fentanyl ? ?ESTIMATED BLOOD LOSS: 5 cc ? ?FINDING(S): ?none ? ?SPECIMEN(S):  None ? ?INDICATIONS:   ?Patient is a 71 y.o.male who presents with non-functional dialysis catheter and ESRD.  The patient needs long term dialysis access for their ESRD, and a Permcath is necessary.  Risks and benefits are discussed and informed consent is obtained.   ? ?DESCRIPTION: ?After obtaining full informed written consent, the patient was brought back to the vascular suite. The patient received moderate conscious sedation during a face-to-face encounter with me present throughout the entire procedure and supervising the RN monitoring the vital signs, pulse oximetry, telemetry, and mental status throughout the entire procedure. The patient's existing catheter, left neck and chest were sterilely prepped and draped in a sterile surgical field was created.  The existing catheter was dissected free from the fibrous sheath securing the cuff with hemostats and blunt dissection.  A wire was placed. The existing catheter was then removed and the wire used to keep venous access. I selected a 23 cm tip to cuff tunneled dialysis catheter.  Using fluoroscopic guidance the catheter tips were parked in the right atrium. The appropriate distal connectors were placed. It withdrew blood well and flushed easily with heparinized saline and a concentrated heparin solution was then placed. It was secured to the chest wall with 2 Prolene sutures. A 4-0 Monocryl pursestring suture was placed around the exit  site. Sterile dressings were placed. ?The patient tolerated the procedure well and was taken to the recovery room in stable condition. ? ?COMPLICATIONS: None ? ?CONDITION: Stable ? ?Leotis Pain ?10/17/2021 ?11:13 AM ? ? ?This note was created with Dragon Medical transcription system. Any errors in dictation are purely unintentional.  ?

## 2021-10-18 ENCOUNTER — Other Ambulatory Visit (INDEPENDENT_AMBULATORY_CARE_PROVIDER_SITE_OTHER): Payer: Self-pay | Admitting: Nurse Practitioner

## 2021-10-18 ENCOUNTER — Ambulatory Visit: Payer: Self-pay

## 2021-10-18 ENCOUNTER — Ambulatory Visit
Admission: RE | Admit: 2021-10-18 | Discharge: 2021-10-18 | Disposition: A | Discharge status: Home / Self Care | Payer: PPO | Attending: Vascular Surgery | Admitting: Vascular Surgery

## 2021-10-18 ENCOUNTER — Encounter
Admission: RE | Disposition: A | Discharge status: Home / Self Care | Payer: Self-pay | Source: Home / Self Care | Attending: Vascular Surgery

## 2021-10-18 DIAGNOSIS — Y841 Kidney dialysis as the cause of abnormal reaction of the patient, or of later complication, without mention of misadventure at the time of the procedure: Secondary | ICD-10-CM | POA: Diagnosis not present

## 2021-10-18 DIAGNOSIS — Z992 Dependence on renal dialysis: Secondary | ICD-10-CM | POA: Insufficient documentation

## 2021-10-18 DIAGNOSIS — T8249XA Other complication of vascular dialysis catheter, initial encounter: Secondary | ICD-10-CM | POA: Insufficient documentation

## 2021-10-18 DIAGNOSIS — N186 End stage renal disease: Secondary | ICD-10-CM | POA: Diagnosis not present

## 2021-10-18 HISTORY — PX: DIALYSIS/PERMA CATHETER INSERTION: CATH118288

## 2021-10-18 LAB — POTASSIUM (ARMC VASCULAR LAB ONLY): Potassium (ARMC vascular lab): 2.4 mmol/L — CL (ref 3.5–5.1)

## 2021-10-18 SURGERY — DIALYSIS/PERMA CATHETER INSERTION
Anesthesia: Moderate Sedation

## 2021-10-18 MED ORDER — DIPHENHYDRAMINE HCL 50 MG/ML IJ SOLN: 50.0000 mg | Freq: Once | INTRAMUSCULAR | Status: DC | PRN

## 2021-10-18 MED ORDER — FENTANYL CITRATE PF 50 MCG/ML IJ SOSY
PREFILLED_SYRINGE | INTRAMUSCULAR | Status: AC
  Filled 2021-10-18: qty 2

## 2021-10-18 MED ORDER — METHYLPREDNISOLONE SODIUM SUCC 125 MG IJ SOLR: 125.0000 mg | Freq: Once | INTRAMUSCULAR | Status: DC | PRN

## 2021-10-18 MED ORDER — ONDANSETRON HCL 4 MG/2ML IJ SOLN: 4.0000 mg | Freq: Four times a day (QID) | INTRAMUSCULAR | Status: DC | PRN

## 2021-10-18 MED ORDER — CEFAZOLIN SODIUM-DEXTROSE 1-4 GM/50ML-% IV SOLN
1.0000 g | Freq: Once | INTRAVENOUS | Status: AC
  Administered 2021-10-18: 1 g via INTRAVENOUS

## 2021-10-18 MED ORDER — POTASSIUM CHLORIDE 10 MEQ/50ML IV SOLN
10.0000 meq | INTRAVENOUS | Status: AC
  Administered 2021-10-18 (×2): 10 meq via INTRAVENOUS
  Filled 2021-10-18 (×2): qty 50

## 2021-10-18 MED ORDER — POTASSIUM CHLORIDE CRYS ER 20 MEQ PO TBCR
EXTENDED_RELEASE_TABLET | ORAL | Status: AC
  Administered 2021-10-18: 40 meq via ORAL
  Filled 2021-10-18: qty 2

## 2021-10-18 MED ORDER — MIDAZOLAM HCL 2 MG/2ML IJ SOLN
INTRAMUSCULAR | Status: AC
  Filled 2021-10-18: qty 4

## 2021-10-18 MED ORDER — HYDROMORPHONE HCL 1 MG/ML IJ SOLN: 1.0000 mg | Freq: Once | INTRAMUSCULAR | Status: DC | PRN

## 2021-10-18 MED ORDER — SODIUM CHLORIDE 0.9 % IV SOLN: INTRAVENOUS | Status: DC

## 2021-10-18 MED ORDER — POTASSIUM CHLORIDE CRYS ER 20 MEQ PO TBCR: 40.0000 meq | EXTENDED_RELEASE_TABLET | Freq: Once | ORAL | Status: AC

## 2021-10-18 MED ORDER — MIDAZOLAM HCL 2 MG/ML PO SYRP: 8.0000 mg | ORAL_SOLUTION | Freq: Once | ORAL | Status: DC | PRN

## 2021-10-18 MED ORDER — FENTANYL CITRATE (PF) 100 MCG/2ML IJ SOLN
INTRAMUSCULAR | Status: DC | PRN
  Administered 2021-10-18: 50 ug via INTRAVENOUS
  Administered 2021-10-18: 25 ug via INTRAVENOUS

## 2021-10-18 MED ORDER — HEPARIN SOD (PORK) LOCK FLUSH 100 UNIT/ML IV SOLN
INTRAVENOUS | Status: AC
  Filled 2021-10-18: qty 5

## 2021-10-18 MED ORDER — MIDAZOLAM HCL 2 MG/2ML IJ SOLN
INTRAMUSCULAR | Status: DC | PRN
  Administered 2021-10-18: 2 mg via INTRAVENOUS
  Administered 2021-10-18: 1 mg via INTRAVENOUS

## 2021-10-18 MED ORDER — FAMOTIDINE 20 MG PO TABS: 40.0000 mg | ORAL_TABLET | Freq: Once | ORAL | Status: DC | PRN

## 2021-10-18 SURGICAL SUPPLY — 5 items
CATH PALINDROME-SP 14.5FX23 (CATHETERS) ×1 IMPLANT
GUIDEWIRE SUPER STIFF .035X180 (WIRE) ×1 IMPLANT
PACK ANGIOGRAPHY (CUSTOM PROCEDURE TRAY) ×1 IMPLANT
SUT MNCRL AB 4-0 PS2 18 (SUTURE) ×1 IMPLANT
SUT PROLENE 0 CT 1 30 (SUTURE) ×1 IMPLANT

## 2021-10-18 NOTE — Op Note (Signed)
OPERATIVE NOTE ? ? ? ?PRE-OPERATIVE DIAGNOSIS: 1. ESRD ?2. Non-functional permcath ? ?POST-OPERATIVE DIAGNOSIS: same as above ? ?PROCEDURE: ?Fluoroscopic guidance for placement of catheter ?Placement of a 23 cm tip to cuff tunneled hemodialysis catheter via the left internal jugular vein and removal of previous catheter ? ?SURGEON: Leotis Pain, MD ? ?ANESTHESIA:  Local with moderate conscious sedation for 15 minutes using 3 mg of Versed and 75 mcg of Fentanyl ? ?ESTIMATED BLOOD LOSS: 3 cc ? ?FINDING(S): ?none ? ?SPECIMEN(S):  None ? ?INDICATIONS:   ?Patient is a 71 y.o.male who presents with non-functional dialysis catheter and ESRD.  The patient needs long term dialysis access for their ESRD, and a Permcath is necessary.  Risks and benefits are discussed and informed consent is obtained.   ? ?DESCRIPTION: ?After obtaining full informed written consent, the patient was brought back to the vascular suite. The patient received moderate conscious sedation during a face-to-face encounter with me present throughout the entire procedure and supervising the RN monitoring the vital signs, pulse oximetry, telemetry, and mental status throughout the entire procedure. The patient's existing catheter, left neck and chest were sterilely prepped and draped in a sterile surgical field was created.  The existing catheter was dissected free from the fibrous sheath securing the cuff with hemostats and blunt dissection.  A wire was placed. The existing catheter was then removed and the wire used to keep venous access. I selected a 23 cm tip to cuff tunneled dialysis catheter.  Using fluoroscopic guidance the catheter tips were parked in the right atrium. The appropriate distal connectors were placed. It withdrew blood well and flushed easily with heparinized saline and a concentrated heparin solution was then placed. It was secured to the chest wall with 2 Prolene sutures. A 4-0 Monocryl pursestring suture was placed around the exit  site. Sterile dressings were placed. ?The patient tolerated the procedure well and was taken to the recovery room in stable condition. ? ?COMPLICATIONS: None ? ?CONDITION: Stable ? ?Leotis Pain ?10/18/2021 ?12:37 PM ? ? ?This note was created with Dragon Medical transcription system. Any errors in dictation are purely unintentional.  ?

## 2021-10-18 NOTE — Progress Notes (Signed)
K+ 2.4. Called MD: orders received. Pt. K+ to be replaced orally and IVPB.  ?

## 2021-10-18 NOTE — Telephone Encounter (Signed)
?  Chief Complaint: wife called to double check if ED is right place as advised by Vascular doctor ?Symptoms: nurse could not use catheter ?Frequency: 2nd day without HD ?Pertinent Negatives:  ?Disposition: '[x]'$ ED /'[]'$ Urgent Care (no appt availability in office) / '[]'$ Appointment(In office/virtual)/ '[]'$  [place]/ '[]'$ Home Care/ '[]'$ Refused Recommended Disposition /'[]'$ [facility]/ '[]'$  Follow-up with PCP ?Additional Notes: advised to go to ED as recommended ? ? ? ? ?Reason for Disposition ? Caller has already spoken with another triager or PCP AND has further questions AND triager able to answer questions. ? ?Protocols used: No Contact or Duplicate Contact Call-A-AH ? ?

## 2021-10-21 ENCOUNTER — Encounter: Payer: Self-pay | Admitting: Vascular Surgery

## 2021-10-22 ENCOUNTER — Encounter
Admission: RE | Admit: 2021-10-22 | Discharge: 2021-10-22 | Disposition: A | Discharge status: Home / Self Care | Payer: PPO | Source: Ambulatory Visit | Attending: Oncology | Admitting: Oncology

## 2021-10-22 ENCOUNTER — Other Ambulatory Visit (HOSPITAL_COMMUNITY): Payer: Self-pay

## 2021-10-22 ENCOUNTER — Inpatient Hospital Stay
Admission: EM | Admit: 2021-10-22 | Discharge: 2021-10-23 | DRG: 299 | Disposition: A | Discharge status: Hospice | Payer: PPO | Attending: Internal Medicine | Admitting: Internal Medicine

## 2021-10-22 ENCOUNTER — Emergency Department: Payer: PPO

## 2021-10-22 ENCOUNTER — Other Ambulatory Visit (HOSPITAL_COMMUNITY): Payer: PPO

## 2021-10-22 ENCOUNTER — Inpatient Hospital Stay (HOSPITAL_COMMUNITY)
Admit: 2021-10-22 | Discharge: 2021-10-22 | Disposition: A | Discharge status: Home / Self Care | Payer: PPO | Attending: Internal Medicine | Admitting: Internal Medicine

## 2021-10-22 ENCOUNTER — Encounter: Admission: RE | Admit: 2021-10-22 | Payer: PPO | Source: Ambulatory Visit

## 2021-10-22 ENCOUNTER — Other Ambulatory Visit: Payer: Self-pay

## 2021-10-22 ENCOUNTER — Ambulatory Visit
Admission: RE | Admit: 2021-10-22 | Discharge: 2021-10-22 | Disposition: A | Discharge status: Home / Self Care | Payer: PPO | Source: Ambulatory Visit | Attending: Oncology | Admitting: Oncology

## 2021-10-22 DIAGNOSIS — K219 Gastro-esophageal reflux disease without esophagitis: Secondary | ICD-10-CM | POA: Diagnosis present

## 2021-10-22 DIAGNOSIS — E039 Hypothyroidism, unspecified: Secondary | ICD-10-CM | POA: Diagnosis not present

## 2021-10-22 DIAGNOSIS — Z20822 Contact with and (suspected) exposure to covid-19: Secondary | ICD-10-CM | POA: Diagnosis present

## 2021-10-22 DIAGNOSIS — D631 Anemia in chronic kidney disease: Secondary | ICD-10-CM | POA: Diagnosis not present

## 2021-10-22 DIAGNOSIS — N1330 Unspecified hydronephrosis: Secondary | ICD-10-CM | POA: Diagnosis not present

## 2021-10-22 DIAGNOSIS — I2609 Other pulmonary embolism with acute cor pulmonale: Secondary | ICD-10-CM | POA: Diagnosis not present

## 2021-10-22 DIAGNOSIS — R000 Tachycardia, unspecified: Secondary | ICD-10-CM | POA: Diagnosis not present

## 2021-10-22 DIAGNOSIS — C7989 Secondary malignant neoplasm of other specified sites: Secondary | ICD-10-CM | POA: Diagnosis not present

## 2021-10-22 DIAGNOSIS — I120 Hypertensive chronic kidney disease with stage 5 chronic kidney disease or end stage renal disease: Secondary | ICD-10-CM | POA: Diagnosis not present

## 2021-10-22 DIAGNOSIS — R600 Localized edema: Secondary | ICD-10-CM | POA: Diagnosis not present

## 2021-10-22 DIAGNOSIS — Z8042 Family history of malignant neoplasm of prostate: Secondary | ICD-10-CM

## 2021-10-22 DIAGNOSIS — F1010 Alcohol abuse, uncomplicated: Secondary | ICD-10-CM | POA: Diagnosis present

## 2021-10-22 DIAGNOSIS — E876 Hypokalemia: Secondary | ICD-10-CM | POA: Diagnosis not present

## 2021-10-22 DIAGNOSIS — C7801 Secondary malignant neoplasm of right lung: Secondary | ICD-10-CM | POA: Diagnosis not present

## 2021-10-22 DIAGNOSIS — I959 Hypotension, unspecified: Secondary | ICD-10-CM | POA: Diagnosis not present

## 2021-10-22 DIAGNOSIS — M7989 Other specified soft tissue disorders: Secondary | ICD-10-CM | POA: Diagnosis not present

## 2021-10-22 DIAGNOSIS — Z7952 Long term (current) use of systemic steroids: Secondary | ICD-10-CM

## 2021-10-22 DIAGNOSIS — Z992 Dependence on renal dialysis: Secondary | ICD-10-CM

## 2021-10-22 DIAGNOSIS — Z801 Family history of malignant neoplasm of trachea, bronchus and lung: Secondary | ICD-10-CM

## 2021-10-22 DIAGNOSIS — I82431 Acute embolism and thrombosis of right popliteal vein: Principal | ICD-10-CM | POA: Diagnosis present

## 2021-10-22 DIAGNOSIS — C7951 Secondary malignant neoplasm of bone: Secondary | ICD-10-CM | POA: Diagnosis not present

## 2021-10-22 DIAGNOSIS — N186 End stage renal disease: Secondary | ICD-10-CM | POA: Diagnosis not present

## 2021-10-22 DIAGNOSIS — C61 Malignant neoplasm of prostate: Secondary | ICD-10-CM | POA: Diagnosis not present

## 2021-10-22 DIAGNOSIS — E038 Other specified hypothyroidism: Secondary | ICD-10-CM | POA: Diagnosis not present

## 2021-10-22 DIAGNOSIS — Z66 Do not resuscitate: Secondary | ICD-10-CM | POA: Diagnosis present

## 2021-10-22 DIAGNOSIS — N3281 Overactive bladder: Secondary | ICD-10-CM | POA: Diagnosis present

## 2021-10-22 DIAGNOSIS — E785 Hyperlipidemia, unspecified: Secondary | ICD-10-CM | POA: Diagnosis present

## 2021-10-22 DIAGNOSIS — Z9884 Bariatric surgery status: Secondary | ICD-10-CM

## 2021-10-22 DIAGNOSIS — W1839XA Other fall on same level, initial encounter: Secondary | ICD-10-CM | POA: Diagnosis present

## 2021-10-22 DIAGNOSIS — I82411 Acute embolism and thrombosis of right femoral vein: Secondary | ICD-10-CM | POA: Diagnosis not present

## 2021-10-22 DIAGNOSIS — G4730 Sleep apnea, unspecified: Secondary | ICD-10-CM | POA: Diagnosis present

## 2021-10-22 DIAGNOSIS — K7460 Unspecified cirrhosis of liver: Secondary | ICD-10-CM | POA: Diagnosis not present

## 2021-10-22 DIAGNOSIS — I10 Essential (primary) hypertension: Secondary | ICD-10-CM | POA: Diagnosis not present

## 2021-10-22 DIAGNOSIS — C7911 Secondary malignant neoplasm of bladder: Secondary | ICD-10-CM | POA: Diagnosis not present

## 2021-10-22 DIAGNOSIS — K7031 Alcoholic cirrhosis of liver with ascites: Secondary | ICD-10-CM | POA: Diagnosis not present

## 2021-10-22 DIAGNOSIS — I2699 Other pulmonary embolism without acute cor pulmonale: Secondary | ICD-10-CM | POA: Diagnosis not present

## 2021-10-22 DIAGNOSIS — F17220 Nicotine dependence, chewing tobacco, uncomplicated: Secondary | ICD-10-CM | POA: Diagnosis present

## 2021-10-22 DIAGNOSIS — Z96611 Presence of right artificial shoulder joint: Secondary | ICD-10-CM | POA: Diagnosis present

## 2021-10-22 DIAGNOSIS — C7802 Secondary malignant neoplasm of left lung: Secondary | ICD-10-CM | POA: Diagnosis present

## 2021-10-22 DIAGNOSIS — I2510 Atherosclerotic heart disease of native coronary artery without angina pectoris: Secondary | ICD-10-CM | POA: Diagnosis present

## 2021-10-22 DIAGNOSIS — Z515 Encounter for palliative care: Secondary | ICD-10-CM | POA: Diagnosis not present

## 2021-10-22 DIAGNOSIS — R531 Weakness: Secondary | ICD-10-CM | POA: Diagnosis not present

## 2021-10-22 DIAGNOSIS — Z86010 Personal history of colonic polyps: Secondary | ICD-10-CM

## 2021-10-22 DIAGNOSIS — Y92009 Unspecified place in unspecified non-institutional (private) residence as the place of occurrence of the external cause: Secondary | ICD-10-CM

## 2021-10-22 DIAGNOSIS — Z7401 Bed confinement status: Secondary | ICD-10-CM | POA: Diagnosis not present

## 2021-10-22 DIAGNOSIS — N2581 Secondary hyperparathyroidism of renal origin: Secondary | ICD-10-CM | POA: Diagnosis not present

## 2021-10-22 DIAGNOSIS — R9431 Abnormal electrocardiogram [ECG] [EKG]: Secondary | ICD-10-CM | POA: Diagnosis not present

## 2021-10-22 DIAGNOSIS — W19XXXA Unspecified fall, initial encounter: Secondary | ICD-10-CM | POA: Diagnosis not present

## 2021-10-22 DIAGNOSIS — I82409 Acute embolism and thrombosis of unspecified deep veins of unspecified lower extremity: Secondary | ICD-10-CM | POA: Diagnosis present

## 2021-10-22 DIAGNOSIS — Z79899 Other long term (current) drug therapy: Secondary | ICD-10-CM

## 2021-10-22 DIAGNOSIS — D6800 Von Willebrand disease, unspecified: Secondary | ICD-10-CM | POA: Diagnosis not present

## 2021-10-22 DIAGNOSIS — Z79890 Hormone replacement therapy (postmenopausal): Secondary | ICD-10-CM

## 2021-10-22 DIAGNOSIS — G8929 Other chronic pain: Secondary | ICD-10-CM | POA: Diagnosis present

## 2021-10-22 DIAGNOSIS — Z96612 Presence of left artificial shoulder joint: Secondary | ICD-10-CM | POA: Diagnosis present

## 2021-10-22 DIAGNOSIS — R42 Dizziness and giddiness: Secondary | ICD-10-CM | POA: Diagnosis not present

## 2021-10-22 DIAGNOSIS — R0689 Other abnormalities of breathing: Secondary | ICD-10-CM | POA: Diagnosis not present

## 2021-10-22 DIAGNOSIS — Z8249 Family history of ischemic heart disease and other diseases of the circulatory system: Secondary | ICD-10-CM

## 2021-10-22 DIAGNOSIS — Z043 Encounter for examination and observation following other accident: Secondary | ICD-10-CM | POA: Diagnosis not present

## 2021-10-22 LAB — CBC WITH DIFFERENTIAL/PLATELET
Abs Immature Granulocytes: 0.09 10*3/uL — ABNORMAL HIGH (ref 0.00–0.07)
Basophils Absolute: 0.1 10*3/uL (ref 0.0–0.1)
Basophils Relative: 1 %
Eosinophils Absolute: 0.2 10*3/uL (ref 0.0–0.5)
Eosinophils Relative: 3 %
HCT: 26.5 % — ABNORMAL LOW (ref 39.0–52.0)
Hemoglobin: 8.8 g/dL — ABNORMAL LOW (ref 13.0–17.0)
Immature Granulocytes: 1 %
Lymphocytes Relative: 11 %
Lymphs Abs: 0.7 10*3/uL (ref 0.7–4.0)
MCH: 33.8 pg (ref 26.0–34.0)
MCHC: 33.2 g/dL (ref 30.0–36.0)
MCV: 101.9 fL — ABNORMAL HIGH (ref 80.0–100.0)
Monocytes Absolute: 0.4 10*3/uL (ref 0.1–1.0)
Monocytes Relative: 7 %
Neutro Abs: 5 10*3/uL (ref 1.7–7.7)
Neutrophils Relative %: 77 %
Platelets: 244 10*3/uL (ref 150–400)
RBC: 2.6 MIL/uL — ABNORMAL LOW (ref 4.22–5.81)
RDW: 15.9 % — ABNORMAL HIGH (ref 11.5–15.5)
WBC: 6.5 10*3/uL (ref 4.0–10.5)
nRBC: 0 % (ref 0.0–0.2)

## 2021-10-22 LAB — COMPREHENSIVE METABOLIC PANEL
ALT: <5 U/L (ref 0–44)
AST: 56 U/L — ABNORMAL HIGH (ref 15–41)
Albumin: 2.4 g/dL — ABNORMAL LOW (ref 3.5–5.0)
Alkaline Phosphatase: 97 U/L (ref 38–126)
Anion gap: 14 (ref 5–15)
BUN: 35 mg/dL — ABNORMAL HIGH (ref 8–23)
CO2: 25 mmol/L (ref 22–32)
Calcium: 7.9 mg/dL — ABNORMAL LOW (ref 8.9–10.3)
Chloride: 99 mmol/L (ref 98–111)
Creatinine, Ser: 7.32 mg/dL — ABNORMAL HIGH (ref 0.61–1.24)
GFR, Estimated: 8 mL/min — ABNORMAL LOW (ref >60)
Glucose, Bld: 94 mg/dL (ref 70–99)
Potassium: 2.8 mmol/L — ABNORMAL LOW (ref 3.5–5.1)
Sodium: 138 mmol/L (ref 135–145)
Total Bilirubin: 1.6 mg/dL — ABNORMAL HIGH (ref 0.3–1.2)
Total Protein: 4.9 g/dL — ABNORMAL LOW (ref 6.5–8.1)

## 2021-10-22 LAB — ECHOCARDIOGRAM COMPLETE
AR max vel: 3.34 cm2
AV Area VTI: 3.4 cm2
AV Area mean vel: 3.67 cm2
AV Mean grad: 6 mmHg
AV Peak grad: 11.3 mmHg
Ao pk vel: 1.68 m/s
Area-P 1/2: 2.43 cm2
Height: 71 in
MV VTI: 2.12 cm2
P 1/2 time: 300 msec
S' Lateral: 2.55 cm
Weight: 3472 oz

## 2021-10-22 LAB — APTT: aPTT: 35 seconds (ref 24–36)

## 2021-10-22 LAB — TSH: TSH: 165 u[IU]/mL — ABNORMAL HIGH (ref 0.350–4.500)

## 2021-10-22 LAB — RESP PANEL BY RT-PCR (FLU A&B, COVID) ARPGX2
Influenza A by PCR: NEGATIVE
Influenza B by PCR: NEGATIVE
SARS Coronavirus 2 by RT PCR: NEGATIVE

## 2021-10-22 LAB — MAGNESIUM: Magnesium: 1.9 mg/dL (ref 1.7–2.4)

## 2021-10-22 LAB — LACTIC ACID, PLASMA
Lactic Acid, Venous: 3.8 mmol/L (ref 0.5–1.9)
Lactic Acid, Venous: 1.7 mmol/L (ref 0.5–1.9)

## 2021-10-22 LAB — HEPARIN LEVEL (UNFRACTIONATED): Heparin Unfractionated: 1.06 IU/mL — ABNORMAL HIGH (ref 0.30–0.70)

## 2021-10-22 LAB — PROTIME-INR
INR: 1.2 (ref 0.8–1.2)
Prothrombin Time: 15.1 seconds (ref 11.4–15.2)

## 2021-10-22 MED ORDER — SODIUM CHLORIDE 0.9 % IV SOLN
2.0000 g | Freq: Once | INTRAVENOUS | Status: AC
  Administered 2021-10-22: 2 g via INTRAVENOUS
  Filled 2021-10-22: qty 12.5

## 2021-10-22 MED ORDER — TRAZODONE HCL 50 MG PO TABS
450.0000 mg | ORAL_TABLET | Freq: Every day | ORAL | Status: DC
  Administered 2021-10-22: 450 mg via ORAL
  Filled 2021-10-22: qty 1

## 2021-10-22 MED ORDER — VANCOMYCIN HCL 2000 MG/400ML IV SOLN
2000.0000 mg | Freq: Once | INTRAVENOUS | Status: AC
  Administered 2021-10-22: 2000 mg via INTRAVENOUS
  Filled 2021-10-22: qty 400

## 2021-10-22 MED ORDER — OXYCODONE HCL 5 MG PO TABS
5.0000 mg | ORAL_TABLET | Freq: Once | ORAL | Status: AC
  Administered 2021-10-22: 5 mg via ORAL
  Filled 2021-10-22: qty 1

## 2021-10-22 MED ORDER — HYDROMORPHONE HCL 1 MG/ML IJ SOLN
0.5000 mg | Freq: Once | INTRAMUSCULAR | Status: AC
  Administered 2021-10-22: 0.5 mg via INTRAVENOUS
  Filled 2021-10-22: qty 1

## 2021-10-22 MED ORDER — CALCIUM CARBONATE 1250 (500 CA) MG PO TABS
1250.0000 mg | ORAL_TABLET | Freq: Two times a day (BID) | ORAL | Status: DC
  Filled 2021-10-22: qty 1

## 2021-10-22 MED ORDER — LEVOTHYROXINE SODIUM 100 MCG PO TABS
200.0000 ug | ORAL_TABLET | Freq: Every day | ORAL | Status: DC
  Administered 2021-10-23: 200 ug via ORAL
  Filled 2021-10-22: qty 2

## 2021-10-22 MED ORDER — HYDROMORPHONE HCL 1 MG/ML IJ SOLN
1.0000 mg | INTRAMUSCULAR | Status: DC | PRN
  Administered 2021-10-22 – 2021-10-23 (×3): 1 mg via INTRAVENOUS
  Filled 2021-10-22 (×3): qty 1

## 2021-10-22 MED ORDER — ABIRATERONE ACETATE 250 MG PO TABS: 1000.0000 mg | ORAL_TABLET | Freq: Every day | ORAL | Status: DC

## 2021-10-22 MED ORDER — METRONIDAZOLE 500 MG/100ML IV SOLN
500.0000 mg | Freq: Once | INTRAVENOUS | Status: AC
  Administered 2021-10-22: 500 mg via INTRAVENOUS
  Filled 2021-10-22: qty 100

## 2021-10-22 MED ORDER — ONDANSETRON HCL 4 MG/2ML IJ SOLN
4.0000 mg | Freq: Once | INTRAMUSCULAR | Status: AC
Start: 2021-10-22 — End: 2021-10-22
  Administered 2021-10-22: 4 mg via INTRAVENOUS
  Filled 2021-10-22: qty 2

## 2021-10-22 MED ORDER — ONDANSETRON HCL 4 MG/2ML IJ SOLN: 4.0000 mg | Freq: Four times a day (QID) | INTRAMUSCULAR | Status: DC | PRN

## 2021-10-22 MED ORDER — ACETAMINOPHEN 650 MG RE SUPP: 650.0000 mg | Freq: Four times a day (QID) | RECTAL | Status: DC | PRN

## 2021-10-22 MED ORDER — HEPARIN (PORCINE) 25000 UT/250ML-% IV SOLN
1350.0000 [IU]/h | INTRAVENOUS | Status: DC
  Administered 2021-10-22: 1600 [IU]/h via INTRAVENOUS
  Administered 2021-10-23: 1350 [IU]/h via INTRAVENOUS
  Filled 2021-10-22 (×2): qty 250

## 2021-10-22 MED ORDER — CHLORHEXIDINE GLUCONATE CLOTH 2 % EX PADS: 6.0000 | MEDICATED_PAD | Freq: Every day | CUTANEOUS | Status: DC

## 2021-10-22 MED ORDER — SODIUM CHLORIDE 0.9 % IV BOLUS (SEPSIS)
500.0000 mL | Freq: Once | INTRAVENOUS | Status: AC
  Administered 2021-10-22: 500 mL via INTRAVENOUS

## 2021-10-22 MED ORDER — ONDANSETRON HCL 4 MG PO TABS: 4.0000 mg | ORAL_TABLET | Freq: Four times a day (QID) | ORAL | Status: DC | PRN

## 2021-10-22 MED ORDER — LACTATED RINGERS IV SOLN: INTRAVENOUS | Status: DC

## 2021-10-22 MED ORDER — POTASSIUM CHLORIDE 10 MEQ/100ML IV SOLN
10.0000 meq | INTRAVENOUS | Status: AC
  Administered 2021-10-22 (×2): 10 meq via INTRAVENOUS
  Filled 2021-10-22 (×2): qty 100

## 2021-10-22 MED ORDER — IOHEXOL 350 MG/ML SOLN
50.0000 mL | Freq: Once | INTRAVENOUS | Status: AC | PRN
  Administered 2021-10-22: 50 mL via INTRAVENOUS

## 2021-10-22 MED ORDER — ACETAMINOPHEN 325 MG PO TABS: 650.0000 mg | ORAL_TABLET | Freq: Four times a day (QID) | ORAL | Status: DC | PRN

## 2021-10-22 MED ORDER — VANCOMYCIN HCL IN DEXTROSE 1-5 GM/200ML-% IV SOLN: 1000.0000 mg | Freq: Once | INTRAVENOUS | Status: DC

## 2021-10-22 NOTE — Consult Note (Signed)
ANTICOAGULATION CONSULT NOTE - Initial Consult ? ?Pharmacy Consult for heparin ?Indication: DVT ? ?No Known Allergies ? ?Patient Measurements: ?Height: '5\' 11"'$  (180.3 cm) ?Weight: 98.4 kg (217 lb) ?IBW/kg (Calculated) : 75.3 ?Heparin Dosing Weight: 95.4 kg ? ?Vital Signs: ?Temp: 98.9 ?F (37.2 ?C) (04/18 1200) ?Temp Source: Oral (04/18 1200) ?BP: 114/72 (04/18 1200) ?Pulse Rate: 92 (04/18 1200) ? ?Labs: ?Recent Labs  ?  10/22/21 ?1015  ?HGB 8.8*  ?HCT 26.5*  ?PLT 244  ?LABPROT 15.1  ?INR 1.2  ?CREATININE 7.32*  ? ? ? ?Estimated Creatinine Clearance: 11.5 mL/min (A) (by C-G formula based on SCr of 7.32 mg/dL (H)). ? ? ?Medical History: ?Past Medical History:  ?Diagnosis Date  ? Anemia   ? Arthritis   ? knees, shoulders  ? CAD (coronary artery disease)   ? No Stents Present- per patient  ? Cancer [facility])   ? Cirrhosis ([place])   ? mild  ? Colon polyps   ? Essential hypertension 08/24/2008  ? GERD (gastroesophageal reflux disease)   ? patient denies  ? History of alcohol abuse Last Drink- 2011  ? History of gout   ? Hypertension   ? no longer on meds  ? Hypothyroidism   ? Intestinal ulcer   ? Iron deficiency anemia due to chronic blood loss 02/27/2017  ? Other pancytopenia ([place]) 02/25/2017  ? Overactive bladder   ? Prostate cancer ([place])   ? Sleep apnea   ? improved since gastric bypass  ? Thyroid disease   ? ? ?Medications:  ?No PTA anticoagulation or antiplatelet therapy ? ?Assessment: ?71 y.o. 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 Venous US significant for extensive right lower extremity DVT. Pharmacy has been consulted for heparin dosing for DVT. Per MD, titrate heparin drip without bolus doses due to history of Von Willebrand disease. ? ?Baseline labs: Hgb 8.8, plts, 244, INR 1.2, aPTT ordered  ? ?Goal of Therapy:  ?Heparin level 0.3-0.7 units/ml **NO bolus**  ?Monitor platelets by anticoagulation protocol: Yes ?  ?Plan:   ?Start heparin infusion at 1600 units/hr ?Check anti-Xa level in 8 hours and daily while on heparin ?Continue to monitor H&H and platelets ? ?Darnelle Bos, PharmD ?10/22/2021,12:37 PM ? ? ?

## 2021-10-22 NOTE — Consult Note (Signed)
ANTICOAGULATION CONSULT NOTE - Initial Consult ? ?Pharmacy Consult for heparin ?Indication: DVT ? ?No Known Allergies ? ?Patient Measurements: ?Height: '5\' 11"'$  (180.3 cm) ?Weight: 98.4 kg (217 lb) ?IBW/kg (Calculated) : 75.3 ?Heparin Dosing Weight: 95.4 kg ? ?Vital Signs: ?Temp: 98 ?F (36.7 ?C) (04/18 2108) ?Temp Source: Oral (04/18 2014) ?BP: 116/67 (04/18 2108) ?Pulse Rate: 84 (04/18 2108) ? ?Labs: ?Recent Labs  ?  10/22/21 ?1015 10/22/21 ?1305 10/22/21 ?2125  ?HGB 8.8*  --   --   ?HCT 26.5*  --   --   ?PLT 244  --   --   ?APTT  --  35  --   ?LABPROT 15.1  --   --   ?INR 1.2  --   --   ?HEPARINUNFRC  --   --  1.06*  ?CREATININE 7.32*  --   --   ? ? ? ?Estimated Creatinine Clearance: 11.5 mL/min (A) (by C-G formula based on SCr of 7.32 mg/dL (H)). ? ? ?Medical History: ?Past Medical History:  ?Diagnosis Date  ? Anemia   ? Arthritis   ? knees, shoulders  ? CAD (coronary artery disease)   ? No Stents Present- per patient  ? Cancer [facility])   ? Cirrhosis ([place])   ? mild  ? Colon polyps   ? Essential hypertension 08/24/2008  ? GERD (gastroesophageal reflux disease)   ? patient denies  ? History of alcohol abuse Last Drink- 2011  ? History of gout   ? Hypertension   ? no longer on meds  ? Hypothyroidism   ? Intestinal ulcer   ? Iron deficiency anemia due to chronic blood loss 02/27/2017  ? Other pancytopenia ([place]) 02/25/2017  ? Overactive bladder   ? Prostate cancer ([place])   ? Sleep apnea   ? improved since gastric bypass  ? Thyroid disease   ? ? ?Medications:  ?No PTA anticoagulation or antiplatelet therapy ? ?Assessment: ?71 y.o. 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 Venous US significant for extensive right lower extremity DVT. Pharmacy has been consulted for heparin dosing for DVT. Per MD, titrate heparin drip without bolus doses due to history of Von Willebrand disease. ? ?4/18 21:25 HL 1.06  ? ?Goal of Therapy:   ?Heparin level 0.3-0.7 units/ml **NO bolus**  ?Monitor platelets by anticoagulation protocol: Yes ?  ?Plan:  ?Heparin level is supratherapeutic. Will decrease heparin infusion to 1350 units/hr. Recheck heparin level in 8 hours. CBC daily while on heparin.  ? ?Oswald Hillock, PharmD, BCPS ?10/22/2021,9:46 PM ? ? ?

## 2021-10-22 NOTE — Assessment & Plan Note (Addendum)
Patient presented to the ER for evaluation of weakness and was found to be hypotensive and tachycardic.  Also noted to have significant right lower extremity swelling. ?CT angiogram of the chest shows acute bilateral pulmonary embolism, moderate clot burden, right greater than left, extending most proximally to the right upper lobar level. CT evidence of right heart strain (RV/LV Ratio = 1.1) consistent with at least submassive (intermediate risk) PE. ?Right lower extremity DVT is consistent with extensive right lower extremity DVT extending from the popliteal vein through the common femoral vein.  ?Venous thromboembolic disease appears to be related to underlying malignancy ?Continue heparin drip initiated in the ER ?Obtain 2D echocardiogram to assess RV strain ?Vascular surgery consult to evaluate for possible mechanical thrombectomy ?Oncology consult for recommendation for anticoagulation therapy ?

## 2021-10-22 NOTE — Assessment & Plan Note (Signed)
Stable

## 2021-10-22 NOTE — Consult Note (Signed)
ANTICOAGULATION CONSULT NOTE - Initial Consult ? ?Pharmacy Consult for heparin ?Indication: DVT ? ?No Known Allergies ? ?Patient Measurements: ?Height: '5\' 11"'$  (180.3 cm) ?Weight: 98.4 kg (217 lb) ?IBW/kg (Calculated) : 75.3 ?Heparin Dosing Weight: 95.4 kg ? ?Vital Signs: ?BP: 115/73 (04/18 1130) ?Pulse Rate: 89 (04/18 1130) ? ?Labs: ?Recent Labs  ?  10/22/21 ?1015  ?HGB 8.8*  ?HCT 26.5*  ?PLT 244  ?LABPROT 15.1  ?INR 1.2  ?CREATININE 7.32*  ? ? ?Estimated Creatinine Clearance: 11.5 mL/min (A) (by C-G formula based on SCr of 7.32 mg/dL (H)). ? ? ?Medical History: ?Past Medical History:  ?Diagnosis Date  ? Anemia   ? Arthritis   ? knees, shoulders  ? CAD (coronary artery disease)   ? No Stents Present- per patient  ? Cancer [facility])   ? Cirrhosis ([place])   ? mild  ? Colon polyps   ? Essential hypertension 08/24/2008  ? GERD (gastroesophageal reflux disease)   ? patient denies  ? History of alcohol abuse Last Drink- 2011  ? History of gout   ? Hypertension   ? no longer on meds  ? Hypothyroidism   ? Intestinal ulcer   ? Iron deficiency anemia due to chronic blood loss 02/27/2017  ? Other pancytopenia ([place]) 02/25/2017  ? Overactive bladder   ? Prostate cancer ([place])   ? Sleep apnea   ? improved since gastric bypass  ? Thyroid disease   ? ? ?Medications:  ?No PTA anticoagulation or antiplatelet therapy ? ?Assessment: ?71 y.o. 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 Venous US significant for extensive right lower extremity DVT. Pharmacy has been consulted for heparin dosing for DVT. Per MD, titrate heparin drip without bolus doses due to history of Von Willebrand disease. ? ?Baseline labs: Hgb 8.8, plts, 244, INR 1.2, aPTT ordered  ? ?Goal of Therapy:  ?Heparin level 0.3-0.7 units/ml **NO boluses**  ?Monitor platelets by anticoagulation protocol: Yes ?  ?Plan:  ?Start heparin infusion at 1600 units/hr ?Check anti-Xa level in 8  hours and daily while on heparin ?Continue to monitor H&H and platelets ? ?Darnelle Bos, PharmD ?10/22/2021,12:08 PM ? ? ?

## 2021-10-22 NOTE — Consult Note (Signed)
CODE SEPSIS - PHARMACY COMMUNICATION ? ?**Broad Spectrum Antibiotics should be administered within 1 hour of Sepsis diagnosis** ? ?Time Code Sepsis Called/Page Received: 1112 ? ?Antibiotics Ordered: 1112 ? ?Time of 1st antibiotic administration: 1134 ? ?Additional action taken by pharmacy: N/A ? ?If necessary, Name of Provider/Nurse Contacted: N/A ? ? ? ?Darnelle Bos ,PharmD ?Clinical Pharmacist  ?10/22/2021  11:31 AM ? ?

## 2021-10-22 NOTE — H&P (Addendum)
?History and Physical  ? ? ?Patient: Dylan Ho PXT:[id number] DOB: May 10, 1951 ?DOA: 10/22/2021 ?DOS: the patient was seen and examined on 10/22/2021 ?PCP: Kirk Ruths, MD  ?Patient coming from: Home ? ?Chief Complaint:  ?Chief Complaint  ?Patient presents with  ? Code Sepsis  ? ?HPI: Dylan Ho is a 71 y.o. male with medical history significant for ESRD on HD (M/W/F), history of metastatic prostate cancer, hypothyroidism, hypertension, gout who presents to the ER via EMS for evaluation of weakness. ?Patient states that he was on his way to the hospital to get some scheduled imaging done and as he approached his vehicle he felt very weak and dropped to his knees.  He denies hitting his head or loss of consciousness.  EMS was called and when they arrived he was noted to be hypotensive with blood pressure in the 80s, patient was also tachycardic. ?He has significant swelling involving his right lower extremity. ?He denies having any chest pain, no shortness of breath, no nausea, no vomiting, no abdominal pain, no dizziness, no lightheadedness, no headache, no blurred vision or focal deficit. ?Patient has had poor oral intake but denies having any nausea or vomiting. ?He missed his scheduled dialysis on Friday because his access was not working but completed his session on Saturday. ?Right lower extremity venous Doppler shows evidence of a DVT and CT angiogram shows acute bilateral pulmonary embolism. ?Patient initially presented to the ER as a code sepsis due to hypotension but does not have any signs of an infectious process at this time.  He received IV fluids and antibiotics. ?He will be admitted to the hospital for further evaluation. ?Review of Systems: As mentioned in the history of present illness. All other systems reviewed and are negative. ?Past Medical History:  ?Diagnosis Date  ? Anemia   ? Arthritis   ? knees, shoulders  ? CAD (coronary artery disease)   ? No Stents Present- per patient  ?  Cancer [facility])   ? Cirrhosis ([place])   ? mild  ? Colon polyps   ? Essential hypertension 08/24/2008  ? GERD (gastroesophageal reflux disease)   ? patient denies  ? History of alcohol abuse Last Drink- 2011  ? History of gout   ? Hypertension   ? no longer on meds  ? Hypothyroidism   ? Intestinal ulcer   ? Iron deficiency anemia due to chronic blood loss 02/27/2017  ? Other pancytopenia ([place]) 02/25/2017  ? Overactive bladder   ? Prostate cancer ([place])   ? Sleep apnea   ? improved since gastric bypass  ? Thyroid disease   ? ?Past Surgical History:  ?Procedure Laterality Date  ? BILATERAL TOTAL SHOULDER ARTHROPLASTY  1990's  ? CIRCUMCISION REVISION N/A 03/13/2017  ? Procedure: CIRCUMCISION REVISION;  Surgeon: Nickie Retort, MD;  Location: [facility] ORS;  Service: Urology;  Laterality: N/A;  ? COLONOSCOPY WITH PROPOFOL N/A 10/30/2016  ? Procedure: COLONOSCOPY WITH PROPOFOL;  Surgeon: Lucilla Lame, MD;  Location: [facility] ENDOSCOPY;  Service: Endoscopy;  Laterality: N/A;  ? DIALYSIS/PERMA CATHETER INSERTION N/A 08/26/2021  ? Procedure: DIALYSIS/PERMA CATHETER INSERTION;  Surgeon: Algernon Huxley, MD;  Location: [place] CV LAB;  Service: Cardiovascular;  Laterality: N/A;  ? DIALYSIS/PERMA CATHETER INSERTION N/A 10/17/2021  ? Procedure: DIALYSIS/PERMA CATHETER INSERTION;  Surgeon: Algernon Huxley, MD;  Location: [place] CV [facility];  Service: Cardiovascular;  Laterality: N/A;  ? DIALYSIS/PERMA CATHETER INSERTION N/A 10/18/2021  ? Procedure: DIALYSIS/PERMA CATHETER INSERTION;  Surgeon: Algernon Huxley, MD;  Location: [place] CV LAB;  Service: Cardiovascular;  Laterality: N/A;  ? ESOPHAGOGASTRODUODENOSCOPY (EGD) WITH PROPOFOL N/A 10/30/2016  ? Procedure: ESOPHAGOGASTRODUODENOSCOPY (EGD) WITH PROPOFOL;  Surgeon: Lucilla Lame, MD;  Location: [facility] ENDOSCOPY;  Service: Endoscopy;  Laterality: N/A;  ? ESOPHAGOGASTRODUODENOSCOPY (EGD) WITH PROPOFOL N/A 03/26/2017  ? Procedure: ESOPHAGOGASTRODUODENOSCOPY (EGD) WITH PROPOFOL;  Surgeon: Lucilla Lame, MD;  Location: [place];  Service: Gastroenterology;  Laterality: N/A;  requests early  ? GASTRIC BYPASS  2016  ? Dr. Nolon Rod, [place]  ? KNEE ARTHROSCOPY  1990s  ? KNEE ARTHROSCOPY W/ MENISCAL REPAIR Bilateral   ? KNEE ARTHROSCOPY WITH MEDIAL MENISECTOMY Left 04/21/2019  ? Procedure: KNEE ARTHROSCOPY WITH MEDIAL MENISECTOMY;  Surgeon: Lovell Sheehan, MD;  Location: [place];  Service: Orthopedics;  Laterality: Left;  ? PORTA CATH INSERTION N/A 11/23/2019  ? Procedure: PORTA CATH INSERTION;  Surgeon: Algernon Huxley, MD;  Location: [place] CV LAB;  Service: Cardiovascular;  Laterality: N/A;  ? TEMPORARY DIALYSIS CATHETER N/A 08/23/2021  ? Procedure: TEMPORARY DIALYSIS CATHETER;  Surgeon: Katha Cabal, MD;  Location: [place] CV LAB;  Service: Cardiovascular;  Laterality: N/A;  ? TONSILLECTOMY  2004  ? XI ROBOTIC ASSISTED INGUINAL HERNIA REPAIR WITH MESH Right 08/13/2021  ? Procedure: XI ROBOTIC ASSISTED INGUINAL HERNIA REPAIR WITH MESH, possible bilateral;  Surgeon: Jules Husbands, MD;  Location: [facility] ORS;  Service: General;  Laterality: Right;  ? ?Social History:  reports that he has never smoked. His smokeless tobacco use includes chew. He reports that he does not drink alcohol and does not use drugs. ? ?No Known Allergies ? ?Family History  ?Problem Relation Age of Onset  ? Stroke Mother   ? Breast cancer Mother   ? Heart disease Father   ? Stroke Father   ? AAA (abdominal aortic aneurysm) Father   ? Lung cancer Paternal Grandmother   ? Prostate cancer Brother   ? Bladder Cancer Neg Hx   ? Kidney cancer Neg Hx   ? ? ?Prior to Admission medications   ?Medication Sig Start Date End Date Taking? Authorizing Provider  ?abiraterone acetate (ZYTIGA) 250 MG tablet Take 4 tablets (1,000 mg total) by mouth daily. Take on an empty stomach 1 hour before or 2 hours after a meal 09/24/21  Yes Sindy Guadeloupe, MD  ?calcium carbonate (OSCAL) 1500 (600 Ca) MG TABS tablet Take by mouth 2  (two) times daily with a meal.   Yes [provider]  ?Lactulose 20 GM/30ML SOLN Take 15-30 mLs (10-20 g total) by mouth daily as needed (constipation). ?Patient taking differently: Take 30 mLs by mouth daily. 07/12/21  Yes Borders, Kirt Boys, NP  ?levothyroxine (SYNTHROID) 200 MCG tablet Take 200 mcg by mouth daily. 03/21/21  Yes [provider]  ?oxyCODONE (OXY IR/ROXICODONE) 5 MG immediate release tablet Take 1 tablet (5 mg total) by mouth every 4 (four) hours as needed for severe pain. 10/10/21  Yes Sindy Guadeloupe, MD  ?predniSONE (DELTASONE) 5 MG tablet Take 1 tablet (5 mg total) by mouth 2 (two) times daily with a meal. 08/05/21  Yes Sindy Guadeloupe, MD  ?aspirin-acetaminophen-caffeine (EXCEDRIN MIGRAINE) 640 102 9035 MG tablet Take 2 tablets by mouth every 6 (six) hours as needed for headache. ?Patient not taking: Reported on 09/16/2021    [provider]  ?hydrALAZINE (APRESOLINE) 50 MG tablet Take 1 tablet (50 mg total) by mouth every 8 (eight) hours as needed (for sbp > 170). ?Patient not taking: Reported on  09/16/2021 08/28/21   Florencia Reasons, MD  ?HYDROcodone-acetaminophen (NORCO/VICODIN) 5-325 MG tablet Take 1-2 tablets by mouth every 4 (four) hours as needed for moderate pain. ?Patient not taking: Reported on 10/22/2021 08/13/21   Caroleen Hamman F, MD  ?pantoprazole (PROTONIX) 40 MG tablet Take 1 tablet (40 mg total) by mouth daily. ?Patient not taking: Reported on 09/27/2021 07/17/20   Sindy Guadeloupe, MD  ?solifenacin (VESICARE) 10 MG tablet Take 1 tablet (10 mg total) by mouth daily. ?Patient not taking: Reported on 09/27/2021 06/17/21   Abbie Sons, MD  ?traZODone (DESYREL) 150 MG tablet Take 4 tablets (600 mg total) by mouth at bedtime. ?Patient taking differently: Take 450 mg by mouth at bedtime. 05/17/18   Glean Hess, MD  ?prochlorperazine (COMPAZINE) 10 MG tablet Take 1 tablet (10 mg total) by mouth every 6 (six) hours as needed (Nausea or vomiting). ?Patient not taking: Reported on  01/27/2020 11/16/19 10/29/20  Sindy Guadeloupe, MD  ? ? ?Physical Exam: ?Vitals:  ? 10/22/21 1008 10/22/21 1130 10/22/21 1200 10/22/21 1300  ?BP:  115/73 114/72 131/76  ?Pulse:  89 92 95  ?Resp:  (!) '22 16 20  '$ ?

## 2021-10-22 NOTE — Assessment & Plan Note (Signed)
Stable Continue Synthroid 

## 2021-10-22 NOTE — Assessment & Plan Note (Signed)
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 ?Imaging shows new bulky pelvic metastatic disease encasing the urinary bladder, bilateral ureterovesical junctions, right external iliac vein and extending throughout the space of [place] and into the right ?inguinal canal. New moderate bilateral hydroureteronephrosis due to ?malignant occlusion at the level of the UVJ bilaterally. Additional progression of widespread metastatic disease to the bones and bilateral lungs. ?We will request oncology consult ?Supportive care ?

## 2021-10-22 NOTE — ED Provider Notes (Signed)
? ?[facility] ?Provider Note ? ? ? None  ?  (approximate) ? ? ?History  ? ?Code Sepsis ? ? ?HPI ? ?Dylan Ho is a 71 y.o. male with extensive past medical history including metastatic prostate cancer with mets to the bone, hypothyroidism, end-stage renal disease on hemodialysis, here with multiple complaints.  The patient states that he is here mostly because he was essentially too weak to even get to the car today, causing him to have a fall.  He was gently lowered to the ground onto his knees.  No direct trauma.  Patient states that in the last several days, he has felt generally weak.  He uses a walker and does not ambulate much at baseline, but has had decreasing mobility due to this weakness over the last several days.  He was actually scheduled to get a full body CT today, which is why he was going to the car.  Patient also missed dialysis on Monday because he was too weak to go.  He has had very poor appetite.  No vomiting or diarrhea.  No cough or shortness of breath.  No abdominal pain.  He has chronic pelvic pain that is not significantly worse than baseline. ?  ? ? ?Physical Exam  ? ?Triage Vital Signs: ?ED Triage Vitals [10/22/21 1007]  ?Enc Vitals Group  ?   BP 132/85  ?   Pulse Rate (!) 102  ?   Resp 20  ?   Temp   ?   Temp src   ?   SpO2   ?   Weight   ?   Height   ?   Head Circumference   ?   Peak Flow   ?   Pain Score   ?   Pain Loc   ?   Pain Edu?   ?   Excl. in [place]?   ? ? ?Most recent vital signs: ?Vitals:  ? 10/22/21 1700 10/22/21 1720  ?BP: 126/80 111/75  ?Pulse: 92 91  ?Resp: 18 14  ?Temp: 98.2 ?F (36.8 ?C) 98.9 ?F (37.2 ?C)  ?SpO2: 100% 96%  ? ? ? ?General: Awake, no distress.  ?CV:  Good peripheral perfusion.  Tachycardic.  No murmurs. ?Resp:  Normal effort.  Lungs clear to auscultation bilaterally. ?Abd:  No distention.  No tenderness.  No rebound or guarding. ?Other:  Marked asymmetric swelling and pitting edema of the right lower extremity, significantly larger  than the left.  1+ pulses bilateral lower extremities.  Normal cap refill.  No erythema or warmth.  No cyanosis. ? ? ?ED Results / Procedures / Treatments  ? ?Labs ?(all labs ordered are listed, but only abnormal results are displayed) ?Labs Reviewed  ?COMPREHENSIVE METABOLIC PANEL - Abnormal; Notable for the following components:  ?    Result Value  ? Potassium 2.8 (*)   ? BUN 35 (*)   ? Creatinine, Ser 7.32 (*)   ? Calcium 7.9 (*)   ? Total Protein 4.9 (*)   ? Albumin 2.4 (*)   ? AST 56 (*)   ? Total Bilirubin 1.6 (*)   ? GFR, Estimated 8 (*)   ? All other components within normal limits  ?LACTIC ACID, PLASMA - Abnormal; Notable for the following components:  ? Lactic Acid, Venous 3.8 (*)   ? All other components within normal limits  ?CBC WITH DIFFERENTIAL/PLATELET - Abnormal; Notable for the following components:  ? RBC 2.60 (*)   ? Hemoglobin 8.8 (*)   ?  HCT 26.5 (*)   ? MCV 101.9 (*)   ? RDW 15.9 (*)   ? Abs Immature Granulocytes 0.09 (*)   ? All other components within normal limits  ?TSH - Abnormal; Notable for the following components:  ? TSH 165.000 (*)   ? All other components within normal limits  ?RESP PANEL BY RT-PCR (FLU A&B, COVID) [id number]  ?CULTURE, BLOOD (ROUTINE X 2)  ?CULTURE, BLOOD (ROUTINE X 2)  ?LACTIC ACID, PLASMA  ?PROTIME-INR  ?APTT  ?MAGNESIUM  ?URINALYSIS, ROUTINE W REFLEX MICROSCOPIC  ?HEPARIN LEVEL (UNFRACTIONATED)  ?HEPATITIS B SURFACE ANTIGEN  ?HEPATITIS B SURFACE ANTIBODY,QUALITATIVE  ?HEPATITIS B SURFACE ANTIBODY, QUANTITATIVE  ?CBC  ?BASIC METABOLIC PANEL  ? ? ? ?EKG ?Normal sinus rhythm, VR 94. PR 162, QRS 86, QTc 435. No acute ST elevations. TWI in inferior leads, V6. No acute ST elevations.  ? ? ?RADIOLOGY ?[place] RLE: Extensive DVT from CFV to popliteal vein ?CT Angio Chest; Bilatral PEs with signs of heart strain, new bulky pelvic metastatic disease ? ? ?I also independently reviewed and agree wit radiologist interpretations. ? ? ?PROCEDURES: ? ?Critical Care performed: Yes, see  critical care procedure note(s) ? ?.Critical Care ?Performed by: Duffy Bruce, MD ?Authorized by: Duffy Bruce, MD  ? ?Critical care provider statement:  ?  Critical care time (minutes):  30 ?  Critical care time was exclusive of:  Separately billable procedures and treating other patients ?  Critical care was necessary to treat or prevent imminent or life-threatening deterioration of the following conditions:  Respiratory failure, circulatory failure and cardiac failure ?  Critical care was time spent personally by me on the following activities:  Development of treatment plan with patient or surrogate, discussions with consultants, evaluation of patient's response to treatment, examination of patient, ordering and review of laboratory studies, ordering and review of radiographic studies, ordering and performing treatments and interventions, pulse oximetry, re-evaluation of patient's condition and review of old charts ? ? ? ?MEDICATIONS ORDERED IN ED: ?Medications  ?lactated ringers infusion ( Intravenous New Bag/Given 10/22/21 1245)  ?heparin ADULT infusion 100 units/mL (25000 units/227m) (1,600 Units/hr Intravenous New Bag/Given 10/22/21 1330)  ?abiraterone acetate (ZYTIGA) tablet 1,000 mg (has no administration in time range)  ?traZODone (DESYREL) tablet 450 mg (has no administration in time range)  ?levothyroxine (SYNTHROID) tablet 200 mcg (has no administration in time range)  ?calcium carbonate (OS-CAL - dosed in mg of elemental calcium) tablet 1,250 mg (has no administration in time range)  ?acetaminophen (TYLENOL) tablet 650 mg (has no administration in time range)  ?  Or  ?acetaminophen (TYLENOL) suppository 650 mg (has no administration in time range)  ?ondansetron (ZOFRAN) tablet 4 mg (has no administration in time range)  ?  Or  ?ondansetron (ZOFRAN) injection 4 mg (has no administration in time range)  ?HYDROmorphone (DILAUDID) injection 1 mg (1 mg Intravenous Given 10/22/21 1635)  ?Chlorhexidine  Gluconate Cloth 2 % PADS 6 each (has no administration in time range)  ?oxyCODONE (Oxy IR/ROXICODONE) immediate release tablet 5 mg (5 mg Oral Given 10/22/21 1126)  ?ondansetron (ZOFRAN) injection 4 mg (4 mg Intravenous Given 10/22/21 1124)  ?sodium chloride 0.9 % bolus 500 mL (0 mLs Intravenous Stopped 10/22/21 1244)  ?ceFEPIme (MAXIPIME) 2 g in sodium chloride 0.9 % 100 mL IVPB (0 g Intravenous Stopped 10/22/21 1225)  ?metroNIDAZOLE (FLAGYL) IVPB 500 mg (0 mg Intravenous Stopped 10/22/21 1354)  ?potassium chloride 10 mEq in 100 mL IVPB (0 mEq Intravenous Stopped 10/22/21 1410)  ?vancomycin (VANCOREADY) IVPB 2000  mg/400 mL (0 mg Intravenous Stopped 10/22/21 1501)  ?iohexol (OMNIPAQUE) 350 MG/ML injection 50 mL (50 mLs Intravenous Contrast Given 10/22/21 1225)  ?HYDROmorphone (DILAUDID) injection 0.5 mg (0.5 mg Intravenous Given 10/22/21 1319)  ? ? ? ?IMPRESSION / MDM / ASSESSMENT AND PLAN / ED COURSE  ?I reviewed the triage vital signs and the nursing notes. ?             ?               ? ? ?The patient is on the cardiac monitor to evaluate for evidence of arrhythmia and/or significant heart rate changes. ? ?MDM:  ?Pleasant 71 yo M with PMHx prostate CA here with RLE swelling, weakness, hypotension, and fall. Re: fall - no signs of injury directly related to this. Re: leg swelling, weakness, primary concern is DVT based on h/o prostate CA and asymmetric edema, with concern for possible PE with his syncope. DDx includes worsening pelvic prostate CA burden with occlusion, lymphangitis.  ? ?Labs show normal WBC. Hgb is at baseline. LA elevated at 3.8, but I suspect some of this elevation is 2/2 hepatic disease.CMP is c/w his ESRD, though interestingly pt also hypokalemic likely related to his poor PO intake. US shows extensive RLE DVT. No signs of phlegmasia clinically. CT Angio, AP subsequently obtained and unfortunately shows b/l PE, worsening tumor burden. Will admit to hospitalist service with Heme-Onc consultation.  Heparin gtt started.  ? ? ?MEDICATIONS GIVEN IN ED: ?Medications  ?lactated ringers infusion ( Intravenous New Bag/Given 10/22/21 1245)  ?heparin ADULT infusion 100 units/mL (25000 units/230m) (1,600 Units/hr

## 2021-10-22 NOTE — Consult Note (Signed)
PHARMACY -  BRIEF ANTIBIOTIC NOTE  ? ?Pharmacy has received consult(s) for vancomycin and cefepime from an ED provider.  The patient's profile has been reviewed for ht/wt/allergies/indication/available labs.   ? ?One time order(s) placed for cefepime 2g and vancomycin 2g IV ? ?Further antibiotics/pharmacy consults should be ordered by admitting physician if indicated.       ?                ?Thank you, ?Darnelle Bos, PharmD ?10/22/2021  11:35 AM ? ?

## 2021-10-22 NOTE — Discharge Planning (Addendum)
Patient informed Nephrology that he does not want anymore dialysis.  Wife was also in the room when patient informed Dr. of this decision.  ? ?Then I, RN, went into the room and also had a lengthy conversation confirming the patient's decision and also making sure they understood exactly what to expect.  Though we do not know the exact timing - this decision may, fairly shortly, end his life.  It was clear they understood and also confirmed they would prefer for him to die at his home (with hospice).  They are also asking for comfort care with no more treatments - he just wants to be comfortable and not in too much pain!  ? ?Hospitalist informed and is placing a palliative care consult to assist with further preparations. ?They hope to be discharged as soon as these preparations are confirmed. ?

## 2021-10-22 NOTE — Progress Notes (Signed)
?Dylan Ho  ?ROUNDING NOTE  ? ?Subjective:  ? ?Dylan Ho is a 71 year old male with past medical conditions including metastatic prostate cancer, hypertension, hypothyroidism, gout, and end-stage renal disease on hemodialysis.  Patient presents to the emergency department after after becoming weak at an appointment and dropping to his knees.  Patient has been admitted for DVT (deep venous thrombosis) ([place]) [I82.409] ? ?Patient is known to our practice and has recently began dialysis treatments at [facility] on a MWF schedule, supervised by Dr. Holley Raring.  Patient reports last dialysis treatment on Monday, completed full treatment with no complications.  Patient states he was arriving to an appointment and his legs became weak.  He denies falling or head trauma.  He states he did fall to his knees.  He denies loss of consciousness.  Reports appropriate appetite without nausea and vomiting.  Denies diarrhea.  Denies shortness of breath.  Denies chest or abdominal pain.  States he has maintained prescribed diet and fluid restriction. ? ?Labs on arrival include potassium 2.8, BUN 35, and creatinine 7.3 with GFR 8.  Albumin 2.4 with lactic acid 3.8.  Respiratory panel negative for influenza and COVID-19.  CT angio chest with and without contrast show acute bilateral pulmonary embolism with moderate clot burden right greater than left, no bulky pelvic metastatic disease encasing the bladder with bilateral hydroureteronephrosis, and additional progression of widespread metastatic disease to bones and bilateral.  Right lower extremity ultrasound shows extensive DVT from popliteal vein through common femoral vein. ? ?We have been consulted to evaluate and manage dialysis needs during this admission ? ? ?Objective:  ?Vital signs in last 24 hours:  ?Temp:  [98.9 ?F (37.2 ?C)] 98.9 ?F (37.2 ?C) (04/18 1200) ?Pulse Rate:  [89-102] 95 (04/18 1300) ?Resp:  [16-22] 20 (04/18 1300) ?BP: (114-132)/(72-85)  131/76 (04/18 1300) ?SpO2:  [95 %-99 %] 99 % (04/18 1300) ?Weight:  [98.4 kg] 98.4 kg (04/18 1008) ? ?Weight change:  ?Filed Weights  ? 10/22/21 1008  ?Weight: 98.4 kg  ? ? ?Intake/Output: ?No intake/output data recorded. ?  ?Intake/Output this shift: ? No intake/output data recorded. ? ?Physical Exam: ?General: NAD, resting comfortably  ?Head: Normocephalic, atraumatic. Moist oral mucosal membranes  ?Eyes: Anicteric  ?Lungs:  Clear to auscultation, normal effort  ?Heart: Regular rate and rhythm, tachycardic  ?Abdomen:  Soft, nontender, obese  ?Extremities: 1-2+ peripheral edema.  ?Neurologic: Nonfocal, moving all four extremities  ?Skin: No lesions  ?Access: Left PermCath  ? ? ?Basic Metabolic Panel: ?Recent Labs  ?Lab 10/17/21 ?1034 10/22/21 ?1012 10/22/21 ?1015  ?NA 137  --  138  ?K 2.2*  --  2.8*  ?CL 100  --  99  ?CO2 28  --  25  ?GLUCOSE 90  --  94  ?BUN 19  --  35*  ?CREATININE 4.33*  --  7.32*  ?CALCIUM 7.7*  --  7.9*  ?MG  --  1.9  --   ? ? ?Liver Function Tests: ?Recent Labs  ?Lab 10/22/21 ?1015  ?AST 56*  ?ALT <5  ?ALKPHOS 97  ?BILITOT 1.6*  ?PROT 4.9*  ?ALBUMIN 2.4*  ? ?No results for input(s): LIPASE, AMYLASE in the last 168 hours. ?No results for input(s): AMMONIA in the last 168 hours. ? ?CBC: ?Recent Labs  ?Lab 10/22/21 ?1015  ?WBC 6.5  ?NEUTROABS 5.0  ?HGB 8.8*  ?HCT 26.5*  ?MCV 101.9*  ?PLT 244  ? ? ?Cardiac Enzymes: ?No results for input(s): CKTOTAL, CKMB, CKMBINDEX, TROPONINI in the last  168 hours. ? ?BNP: ?Invalid input(s): POCBNP ? ?CBG: ?No results for input(s): GLUCAP in the last 168 hours. ? ?Microbiology: ?Results for orders placed or performed during the hospital encounter of 10/22/21  ?Resp Panel by RT-PCR (Flu A&B, Covid) Nasopharyngeal Swab     Status: None  ? Collection Time: 10/22/21  1:05 PM  ? Specimen: Nasopharyngeal Swab; Nasopharyngeal(NP) swabs in vial transport medium  ?Result Value Ref Range Status  ? SARS Coronavirus 2 by RT PCR NEGATIVE NEGATIVE Final  ?  Comment:  (NOTE) ?SARS-CoV-2 target nucleic acids are NOT DETECTED. ? ?The SARS-CoV-2 RNA is generally detectable in upper respiratory ?specimens during the acute phase of infection. The lowest ?concentration of SARS-CoV-2 viral copies this assay can detect is ?138 copies/mL. A negative result does not preclude SARS-Cov-2 ?infection and should not be used as the sole basis for treatment or ?other patient management decisions. A negative result may occur with  ?improper specimen collection/handling, submission of specimen other ?than nasopharyngeal swab, presence of viral mutation(s) within the ?areas targeted by this assay, and inadequate number of viral ?copies(<138 copies/mL). A negative result must be combined with ?clinical observations, patient history, and epidemiological ?information. The expected result is Negative. ? ?Fact Sheet for Patients:  ?EntrepreneurPulse.com.au ? ?Fact Sheet for Healthcare Providers:  ?IncredibleEmployment.be ? ?This test is no t yet approved or cleared by the [place] FDA and  ?has been authorized for detection and/or diagnosis of SARS-CoV-2 by ?FDA under an Emergency Use Authorization (EUA). This EUA will remain  ?in effect (meaning this test can be used) for the duration of the ?COVID-19 declaration under Section 564(b)(1) of the Act, 21 ?U.S.C.section 360bbb-3(b)(1), unless the authorization is terminated  ?or revoked sooner.  ? ? ?  ? Influenza A by PCR NEGATIVE NEGATIVE Final  ? Influenza B by PCR NEGATIVE NEGATIVE Final  ?  Comment: (NOTE) ?The Xpert Xpress SARS-CoV-2/FLU/RSV plus assay is intended as an aid ?in the diagnosis of influenza from Nasopharyngeal swab specimens and ?should not be used as a sole basis for treatment. Nasal washings and ?aspirates are unacceptable for Xpert Xpress SARS-CoV-2/FLU/RSV ?testing. ? ?Fact Sheet for Patients: ?EntrepreneurPulse.com.au ? ?Fact Sheet for Healthcare  Providers: ?IncredibleEmployment.be ? ?This test is not yet approved or cleared by the [place] FDA and ?has been authorized for detection and/or diagnosis of SARS-CoV-2 by ?FDA under an Emergency Use Authorization (EUA). This EUA will remain ?in effect (meaning this test can be used) for the duration of the ?COVID-19 declaration under Section 564(b)(1) of the Act, 21 U.S.C. ?section 360bbb-3(b)(1), unless the authorization is terminated or ?revoked. ? ?Performed at [facility], [place], ?[place] [address] ?  ? ? ?Coagulation Studies: ?Recent Labs  ?  10/22/21 ?1015  ?LABPROT 15.1  ?INR 1.2  ? ? ?Urinalysis: ?No results for input(s): COLORURINE, LABSPEC, [place], GLUCOSEU, HGBUR, BILIRUBINUR, KETONESUR, PROTEINUR, UROBILINOGEN, NITRITE, LEUKOCYTESUR in the last 72 hours. ? ?Invalid input(s): APPERANCEUR  ? ? ?Imaging: ?CT Angio Chest PE W and/or Wo Contrast ? ?Result Date: 10/22/2021 ?CLINICAL DATA:  Fall at home. Weakness. Hypotension. Tachycardia. Stage IV prostate cancer on oral chemotherapy. * Tracking Code: BO * EXAM: CT ANGIOGRAPHY CHEST CT ABDOMEN AND PELVIS WITH CONTRAST TECHNIQUE: Multidetector CT imaging of the chest was performed using the standard protocol during bolus administration of intravenous contrast. Multiplanar CT image reconstructions and MIPs were obtained to evaluate the vascular anatomy. Multidetector CT imaging of the abdomen and pelvis was performed using the standard protocol during bolus  administration of intravenous contrast. RADIATION DOSE REDUCTION: This exam was performed according to the departmental dose-optimization program which includes automated exposure control, adjustment of the mA and/or kV according to patient size and/or use of iterative reconstruction technique. CONTRAST:  13m OMNIPAQUE IOHEXOL 350 MG/ML SOLN COMPARISON:  04/12/2021 CT chest, abdomen and pelvis. FINDINGS: CTA CHEST FINDINGS Cardiovascular: The study is  high quality for the evaluation of pulmonary embolism. Acute bilateral pulmonary embolism, moderate clot burden, right greater than left, involving lobar and segmental right upper lobe, subsegmental right middle lobe, segmental and subsegmental r

## 2021-10-22 NOTE — Assessment & Plan Note (Signed)
Patient received 2 doses of IV potassium ?Check magnesium levels ?

## 2021-10-22 NOTE — Consult Note (Addendum)
? ?Hematology/Oncology Consult note ?[place] ?[phone] [phone] ? ?Patient Care Team: ?Kirk Ruths, MD as PCP - General (Internal Medicine) ?Nickie Retort, MD (Inactive) as Consulting Physician (Urology) ?Lucilla Lame, MD as Consulting Physician (Gastroenterology) ?Lovell Sheehan, MD as Consulting Physician (Orthopedic Surgery) ?Sindy Guadeloupe, MD as Consulting Physician (Oncology)  ? ?Name of the patient: Dylan Ho  ?[id number]  ?1950-08-10  ? ? ?Reason for consult: Castrate resistant metastatic prostate cancer ?  ?Requesting physician: Dr. Francine Graven ? ?Date of visit: 10/22/2021 ? ? ? 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  He presented to the hospital in February 2023 with symptoms of anuria and acute kidney injury requiring dialysis.  Just prior to that he has had PSMA PET scan in January 2023 which mainly showed bone metastases but no significant intra-abdominal adenopathy or obstructive pathology that would explain the kidney injury.  Following this patient was switched to [facility] in early March 2023. ? ?Patient has been doing poorly overall over the last 3 to 4 weeks.  This week patient has not been able to go for his dialysis because of weakness. ? ?He was supposed to get outpatient scans in the near future but presented to the ER with symptoms of worsening weakness and following a fall.  He had a CT angio chest as well as CT venogram abdomen and pelvis.  CT chest showed acute bilateral PE with moderate clot burden with evidence of right heart strain.  CT abdomen showed new bulky pelvic metastatic disease encasing the urinary bladder bilateral ureterovesical junctions, right external iliac vein and extending throughout the space of Retzius and into the right inguinal canal.  New moderate  bilateral hydronephroureter process due to malignant obstruction at the level of UVJ bilaterally.  Progression of disease in bones and bilateral lungs. ? ?ECOG PS- 4 ? ?Pain scale- 3 ? ? ?Review of systems- Review of Systems  ?Constitutional:  Positive for malaise/fatigue and weight loss. Negative for chills and fever.  ?HENT:  Negative for congestion, ear discharge and nosebleeds.   ?Eyes:  Negative for blurred vision.  ?Respiratory:  Negative for cough, hemoptysis, sputum production, shortness of breath and wheezing.   ?Cardiovascular:  Negative for chest pain, palpitations, orthopnea and claudication.  ?Gastrointestinal:  Negative for abdominal pain, blood in stool, constipation, diarrhea, heartburn, melena, nausea and vomiting.  ?Genitourinary:  Negative for dysuria, flank pain, frequency, hematuria and urgency.  ?Musculoskeletal:  Negative for back pain, joint pain and myalgias.  ?Skin:  Negative for rash.  ?Neurological:  Negative for dizziness, tingling, focal weakness, seizures, weakness and headaches.  ?Endo/Heme/Allergies:  Does not bruise/bleed easily.  ?Psychiatric/Behavioral:  Negative for depression and suicidal ideas. The patient does not have insomnia.   ? ?No Known Allergies ? ?Patient Active Problem List  ? Diagnosis Date Noted  ? DVT (deep venous thrombosis) ([place]) 10/22/2021  ? Acute pulmonary embolism ([place]) 10/22/2021  ? Hypokalemia 10/22/2021  ? ESRD on dialysis ([place]) 10/18/2021  ? AKI (acute kidney injury) ([place]) 08/22/2021  ? Prostate cancer metastatic to bone ([place]) 11/16/2019  ? Goals of care, counseling/discussion 11/16/2019  ? History of arthroscopy of knee 05/02/2019  ? Tear of meniscus of knee 01/28/2019  ? Constipation 07/09/2017  ? Impingement syndrome of right shoulder region 05/13/2017  ? Iron deficiency anemia due to chronic blood loss 02/27/2017  ? B12 deficiency  01/09/2017  ? 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 Osteoarthritis of both knees 01/09/2017  ? Insomnia 12/12/2016  ? Hx of  gastric bypass 12/12/2016  ? Cirrhosis of liver ([place]) 11/19/2016  ? Positive Helicobacter pylori serology 11/19/2016  ? Sleep apnea 11/19/2016  ? Von Willebrand disease 11/19/2016  ? Anastomotic ulcer S/P gastric bypass   ? Obesity (BMI 30.0-34.9) 01/19/2014  ? Nondependent alcohol abuse 01/11/2010  ? Hypothyroidism 01/11/2010  ? Hyperlipidemia 01/11/2010  ? Coronary atherosclerosis of native coronary artery 09/22/2008  ? ? ? ?Past Medical History:  ?Diagnosis Date  ? Anemia   ? Arthritis   ? knees, shoulders  ? CAD (coronary artery disease)   ? No Stents Present- per patient  ? Cancer [facility])   ? Cirrhosis ([place])   ? mild  ? Colon polyps   ? Essential hypertension 08/24/2008  ? GERD (gastroesophageal reflux disease)   ? patient denies  ? History of alcohol abuse Last Drink- 2011  ? History of gout   ? Hypertension   ? no longer on meds  ? Hypothyroidism   ? Intestinal ulcer   ? Iron deficiency anemia due to chronic blood loss 02/27/2017  ? Other pancytopenia ([place]) 02/25/2017  ? Overactive bladder   ? Prostate cancer ([place])   ? Sleep apnea   ? improved since gastric bypass  ? Thyroid disease   ? ? ? ?Past Surgical History:  ?Procedure Laterality Date  ? BILATERAL TOTAL SHOULDER ARTHROPLASTY  1990's  ? CIRCUMCISION REVISION N/A 03/13/2017  ? Procedure: CIRCUMCISION REVISION;  Surgeon: Nickie Retort, MD;  Location: [facility] ORS;  Service: Urology;  Laterality: N/A;  ? COLONOSCOPY WITH PROPOFOL N/A 10/30/2016  ? Procedure: COLONOSCOPY WITH PROPOFOL;  Surgeon: Lucilla Lame, MD;  Location: [facility];  Service: Endoscopy;  Laterality: N/A;  ? DIALYSIS/PERMA CATHETER INSERTION N/A 08/26/2021  ? Procedure: DIALYSIS/PERMA CATHETER INSERTION;  Surgeon: Algernon Huxley, MD;  Location: [place] CV LAB;  Service: Cardiovascular;  Laterality: N/A;  ? DIALYSIS/PERMA CATHETER INSERTION N/A 10/17/2021  ? Procedure: DIALYSIS/PERMA CATHETER INSERTION;  Surgeon: Algernon Huxley, MD;  Location: [place] CV LAB;  Service:  Cardiovascular;  Laterality: N/A;  ? DIALYSIS/PERMA CATHETER INSERTION N/A 10/18/2021  ? Procedure: DIALYSIS/PERMA CATHETER INSERTION;  Surgeon: Algernon Huxley, MD;  Location: [place] CV LAB;  Service: Cardiovascular;  Laterality: N/A;  ? ESOPHAGOGASTRODUODENOSCOPY (EGD) WITH PROPOFOL N/A 10/30/2016  ? Procedure: ESOPHAGOGASTRODUODENOSCOPY (EGD) WITH PROPOFOL;  Surgeon: Lucilla Lame, MD;  Location: [facility];  Service: Endoscopy;  Laterality: N/A;  ? ESOPHAGOGASTRODUODENOSCOPY (EGD) WITH PROPOFOL N/A 03/26/2017  ? Procedure: ESOPHAGOGASTRODUODENOSCOPY (EGD) WITH PROPOFOL;  Surgeon: Lucilla Lame, MD;  Location: [place];  Service: Gastroenterology;  Laterality: N/A;  requests early  ? GASTRIC BYPASS  2016  ? Dr. Nolon Rod, [place]  ? KNEE ARTHROSCOPY  1990s  ? KNEE ARTHROSCOPY W/ MENISCAL REPAIR Bilateral   ? KNEE ARTHROSCOPY WITH MEDIAL MENISECTOMY Left 04/21/2019  ? Procedure: KNEE ARTHROSCOPY WITH MEDIAL MENISECTOMY;  Surgeon: Lovell Sheehan, MD;  Location: [place];  Service: Orthopedics;  Laterality: Left;  ? PORTA CATH INSERTION N/A 11/23/2019  ? Procedure: PORTA CATH INSERTION;  Surgeon: Algernon Huxley, MD;  Location: [place] CV LAB;  Service: Cardiovascular;  Laterality: N/A;  ? TEMPORARY DIALYSIS CATHETER N/A 08/23/2021  ? Procedure: TEMPORARY DIALYSIS CATHETER;  Surgeon: Katha Cabal, MD;  Location: [place] CV LAB;  Service: Cardiovascular;  Laterality: N/A;  ? TONSILLECTOMY  2004  ? XI ROBOTIC ASSISTED  INGUINAL HERNIA REPAIR WITH MESH Right 08/13/2021  ? Procedure: XI ROBOTIC ASSISTED INGUINAL HERNIA REPAIR WITH MESH, possible bilateral;  Surgeon: Jules Husbands, MD;  Location: [facility];  Service: General;  Laterality: Right;  ? ? ?Social History  ? ?Socioeconomic History  ? Marital status: Married  ?  Spouse name: Not on file  ? Number of children: Not on file  ? Years of education: Not on file  ? Highest education level: Not on file  ?Occupational History  ? Not on  file  ?Tobacco Use  ? Smoking status: Never  ? Smokeless tobacco: Current  ?  Types: Chew  ?Vaping Use  ? Vaping Use: Never used  ?Substance and Sexual Activity  ? Alcohol use: No  ?  Comment: Heavy ETOH in past- [place]

## 2021-10-22 NOTE — Assessment & Plan Note (Signed)
Treatment as outlined in 1 

## 2021-10-22 NOTE — ED Triage Notes (Signed)
Pt arrives via EMS from home for a fall- pt fell to his knees d/t weakness- pt did not hit head, pt BP was low in the 80s per EMS- last BP was 120/70, pt tachy at 110 per EMS- pt has stage 4 prostate cancer and takes chemo pills- pt has had blood in his urine the last few says- pt also does dialysis MWF and is unsure if he went yesterday ?

## 2021-10-22 NOTE — Assessment & Plan Note (Signed)
Dialysis days are M/W/F ?We will consult nephrology for renal replacement therapy during this hospitalization ?

## 2021-10-22 NOTE — Sepsis Progress Note (Signed)
eLink is following this Code Sepsis. °

## 2021-10-23 DIAGNOSIS — Z515 Encounter for palliative care: Secondary | ICD-10-CM | POA: Diagnosis not present

## 2021-10-23 DIAGNOSIS — I2609 Other pulmonary embolism with acute cor pulmonale: Secondary | ICD-10-CM | POA: Diagnosis not present

## 2021-10-23 DIAGNOSIS — N186 End stage renal disease: Secondary | ICD-10-CM | POA: Diagnosis not present

## 2021-10-23 DIAGNOSIS — K7460 Unspecified cirrhosis of liver: Secondary | ICD-10-CM

## 2021-10-23 DIAGNOSIS — I82411 Acute embolism and thrombosis of right femoral vein: Secondary | ICD-10-CM | POA: Diagnosis not present

## 2021-10-23 LAB — HEPARIN LEVEL (UNFRACTIONATED): Heparin Unfractionated: 0.51 IU/mL (ref 0.30–0.70)

## 2021-10-23 LAB — CBC
HCT: 22.4 % — ABNORMAL LOW (ref 39.0–52.0)
Hemoglobin: 7 g/dL — ABNORMAL LOW (ref 13.0–17.0)
MCH: 32.9 pg (ref 26.0–34.0)
MCHC: 31.3 g/dL (ref 30.0–36.0)
MCV: 105.2 fL — ABNORMAL HIGH (ref 80.0–100.0)
Platelets: 150 10*3/uL (ref 150–400)
RBC: 2.13 MIL/uL — ABNORMAL LOW (ref 4.22–5.81)
RDW: 15.9 % — ABNORMAL HIGH (ref 11.5–15.5)
WBC: 3.8 10*3/uL — ABNORMAL LOW (ref 4.0–10.5)
nRBC: 0 % (ref 0.0–0.2)

## 2021-10-23 LAB — BASIC METABOLIC PANEL
Anion gap: 10 (ref 5–15)
BUN: 38 mg/dL — ABNORMAL HIGH (ref 8–23)
CO2: 24 mmol/L (ref 22–32)
Calcium: 7.3 mg/dL — ABNORMAL LOW (ref 8.9–10.3)
Chloride: 101 mmol/L (ref 98–111)
Creatinine, Ser: 7.79 mg/dL — ABNORMAL HIGH (ref 0.61–1.24)
GFR, Estimated: 7 mL/min — ABNORMAL LOW (ref >60)
Glucose, Bld: 72 mg/dL (ref 70–99)
Potassium: 3.3 mmol/L — ABNORMAL LOW (ref 3.5–5.1)
Sodium: 135 mmol/L (ref 135–145)

## 2021-10-23 MED ORDER — ONDANSETRON HCL 4 MG PO TABS: 4.0000 mg | ORAL_TABLET | Freq: Four times a day (QID) | ORAL | 0 refills | Status: AC | PRN

## 2021-10-23 MED ORDER — MORPHINE SULFATE (CONCENTRATE) 20 MG/ML PO SOLN: 5.0000 mg | ORAL | 0 refills | Status: DC | PRN

## 2021-10-23 MED ORDER — HYOSCYAMINE SULFATE 0.125 MG SL SUBL: 0.1250 mg | SUBLINGUAL_TABLET | SUBLINGUAL | 0 refills | Status: AC | PRN

## 2021-10-23 MED ORDER — SENNOSIDES 8.6 MG PO TABS: 2.0000 | ORAL_TABLET | Freq: Two times a day (BID) | ORAL | 0 refills | Status: AC

## 2021-10-23 MED ORDER — HEPARIN SODIUM (PORCINE) 1000 UNIT/ML DIALYSIS: 1000.0000 [IU] | INTRAMUSCULAR | Status: DC | PRN

## 2021-10-23 MED ORDER — OXYCODONE HCL 5 MG PO TABS
5.0000 mg | ORAL_TABLET | ORAL | 0 refills | Status: DC | PRN
Start: 2021-10-23 — End: 2021-10-25

## 2021-10-23 MED ORDER — LIDOCAINE-PRILOCAINE 2.5-2.5 % EX CREA: 1.0000 "application " | TOPICAL_CREAM | CUTANEOUS | Status: DC | PRN

## 2021-10-23 MED ORDER — LORAZEPAM 0.5 MG PO TABS: 0.5000 mg | ORAL_TABLET | ORAL | 0 refills | Status: AC | PRN

## 2021-10-23 MED ORDER — LIDOCAINE HCL (PF) 1 % IJ SOLN: 5.0000 mL | INTRAMUSCULAR | Status: DC | PRN

## 2021-10-23 MED ORDER — SODIUM CHLORIDE 0.9 % IV SOLN: 100.0000 mL | INTRAVENOUS | Status: DC | PRN

## 2021-10-23 MED ORDER — PENTAFLUOROPROP-TETRAFLUOROETH EX AERO: 1.0000 "application " | INHALATION_SPRAY | CUTANEOUS | Status: DC | PRN

## 2021-10-23 MED ORDER — MIDODRINE HCL 5 MG PO TABS
5.0000 mg | ORAL_TABLET | Freq: Once | ORAL | Status: AC
  Administered 2021-10-23: 5 mg via ORAL
  Filled 2021-10-23: qty 1

## 2021-10-23 MED ORDER — LACTATED RINGERS IV BOLUS: 250.0000 mL | Freq: Once | INTRAVENOUS | Status: DC

## 2021-10-23 MED ORDER — ALTEPLASE 2 MG IJ SOLR: 2.0000 mg | Freq: Once | INTRAMUSCULAR | Status: DC | PRN

## 2021-10-23 NOTE — Progress Notes (Addendum)
[facility] ED [facility]) Hospital Liaison Note ?  ?Received request from Transitions of Care Manager, Judson Roch, for hospice services at home after discharge. Chart and patient information under review by [facility] physician. Hospice eligibility APPROVED. ?  ?Spoke with patient at bedside, brother/Mike & wife/Teresa to initiate education related to hospice philosophy, services, and team approach to care. All verbalized understanding of information given. Per discussion, the plan is for patient to discharge home via [facility] once cleared to DC.  ?  ?DME needs discussed. Patient has the following equipment in the home ?(Purchased privately): ?N/A ?Patient requests the following equipment for delivery: ?[employer] ? ?Address verified and is correct in the chart. Ronalee Belts is the family member to contact to arrange time of equipment delivery.  ?  ?Please send signed and completed DNR home with patient/family. Please provide prescriptions at discharge as needed to ensure ongoing symptom management.  ?  ?[facility] information and contact numbers given to family & above information shared with TOC. ?  ?Please call with any questions/concerns.  ?  ?Thank you for the opportunity to participate in this patient's care. ?  ?Daphene Calamity, MSW ?[place]  ?[phone] ? ?

## 2021-10-23 NOTE — Consult Note (Signed)
? ?  ?Palliative Medicine ?[place] at [facility] ?Telephone:(336) [phone] [phone] ? ? ?Name: Dylan Ho ?Date: 10/23/2021 ?MRN: [id number]  ?DOB: 01/17/1951 ? ?Patient Care Team: ?Kirk Ruths, MD as PCP - General (Internal Medicine) ?Nickie Retort, MD (Inactive) as Consulting Physician (Urology) ?Lucilla Lame, MD as Consulting Physician (Gastroenterology) ?Lovell Sheehan, MD as Consulting Physician (Orthopedic Surgery) ?Sindy Guadeloupe, MD as Consulting Physician (Oncology)  ? ? ?REASON FOR CONSULTATION: ?Dylan Ho is a 71 y.o. male with multiple medical problems including ESRD on HD, metastatic prostate cancer, history of alcohol-related cirrhosis.  Patient was admitted to the hospital 10/22/2021 with DVT and acute PE after presenting to the ED with progressive weakness and hypertension.  CT is revealed overall disease progression with new bulky pelvic metastatic disease, new moderate bilateral hydroureteronephrosis, progression of metastatic disease to bones and lungs.  Patient ultimately decided to transition to comfort care and discontinue hemodialysis.  Palliative care was consulted to address goals and coordinate care. ? ?SOCIAL HISTORY:    ? reports that he has never smoked. His smokeless tobacco use includes chew. He reports that he does not drink alcohol and does not use drugs. ? ?Patient is married and lives at home with his wife. ? ?ADVANCE DIRECTIVES:  ?None ? ?CODE STATUS: DNR ? ?PAST MEDICAL HISTORY: ?Past Medical History:  ?Diagnosis Date  ? Anemia   ? Arthritis   ? knees, shoulders  ? CAD (coronary artery disease)   ? No Stents Present- per patient  ? Cancer [facility])   ? Cirrhosis ([place])   ? mild  ? Colon polyps   ? Essential hypertension 08/24/2008  ? GERD (gastroesophageal reflux disease)   ? patient denies  ? History of alcohol abuse Last Drink- 2011  ? History of gout   ? Hypertension   ? no longer on meds  ? Hypothyroidism   ? Intestinal  ulcer   ? Iron deficiency anemia due to chronic blood loss 02/27/2017  ? Other pancytopenia ([place]) 02/25/2017  ? Overactive bladder   ? Prostate cancer ([place])   ? Sleep apnea   ? improved since gastric bypass  ? Thyroid disease   ? ? ?PAST SURGICAL HISTORY:  ?Past Surgical History:  ?Procedure Laterality Date  ? BILATERAL TOTAL SHOULDER ARTHROPLASTY  1990's  ? CIRCUMCISION REVISION N/A 03/13/2017  ? Procedure: CIRCUMCISION REVISION;  Surgeon: Nickie Retort, MD;  Location: [facility];  Service: Urology;  Laterality: N/A;  ? COLONOSCOPY WITH PROPOFOL N/A 10/30/2016  ? Procedure: COLONOSCOPY WITH PROPOFOL;  Surgeon: Lucilla Lame, MD;  Location: [facility];  Service: Endoscopy;  Laterality: N/A;  ? DIALYSIS/PERMA CATHETER INSERTION N/A 08/26/2021  ? Procedure: DIALYSIS/PERMA CATHETER INSERTION;  Surgeon: Algernon Huxley, MD;  Location: [place] CV LAB;  Service: Cardiovascular;  Laterality: N/A;  ? DIALYSIS/PERMA CATHETER INSERTION N/A 10/17/2021  ? Procedure: DIALYSIS/PERMA CATHETER INSERTION;  Surgeon: Algernon Huxley, MD;  Location: [place] CV LAB;  Service: Cardiovascular;  Laterality: N/A;  ? DIALYSIS/PERMA CATHETER INSERTION N/A 10/18/2021  ? Procedure: DIALYSIS/PERMA CATHETER INSERTION;  Surgeon: Algernon Huxley, MD;  Location: [place] CV LAB;  Service: Cardiovascular;  Laterality: N/A;  ? ESOPHAGOGASTRODUODENOSCOPY (EGD) WITH PROPOFOL N/A 10/30/2016  ? Procedure: ESOPHAGOGASTRODUODENOSCOPY (EGD) WITH PROPOFOL;  Surgeon: Lucilla Lame, MD;  Location: [facility];  Service: Endoscopy;  Laterality: N/A;  ? ESOPHAGOGASTRODUODENOSCOPY (EGD) WITH PROPOFOL N/A 03/26/2017  ? Procedure: ESOPHAGOGASTRODUODENOSCOPY (EGD) WITH PROPOFOL;  Surgeon: Lucilla Lame, MD;  Location: [facility];  Service: Gastroenterology;  Laterality: N/A;  requests early  ? GASTRIC BYPASS  2016  ? Dr. Nolon Rod, [place]  ? KNEE ARTHROSCOPY  1990s  ? KNEE ARTHROSCOPY W/ MENISCAL REPAIR Bilateral   ? KNEE ARTHROSCOPY WITH MEDIAL  MENISECTOMY Left 04/21/2019  ? Procedure: KNEE ARTHROSCOPY WITH MEDIAL MENISECTOMY;  Surgeon: Lovell Sheehan, MD;  Location: [place];  Service: Orthopedics;  Laterality: Left;  ? PORTA CATH INSERTION N/A 11/23/2019  ? Procedure: PORTA CATH INSERTION;  Surgeon: Algernon Huxley, MD;  Location: [place] CV LAB;  Service: Cardiovascular;  Laterality: N/A;  ? TEMPORARY DIALYSIS CATHETER N/A 08/23/2021  ? Procedure: TEMPORARY DIALYSIS CATHETER;  Surgeon: Katha Cabal, MD;  Location: [place] CV LAB;  Service: Cardiovascular;  Laterality: N/A;  ? TONSILLECTOMY  2004  ? XI ROBOTIC ASSISTED INGUINAL HERNIA REPAIR WITH MESH Right 08/13/2021  ? Procedure: XI ROBOTIC ASSISTED INGUINAL HERNIA REPAIR WITH MESH, possible bilateral;  Surgeon: Jules Husbands, MD;  Location: [facility];  Service: General;  Laterality: Right;  ? ? ?HEMATOLOGY/ONCOLOGY HISTORY:  ?Oncology History  ?Prostate cancer ([place])  ?11/16/2019 Initial Diagnosis  ? Prostate cancer metastatic to bone [facility]) ? ?  ?12/16/2019 -  Chemotherapy  ? The patient had dexamethasone (DECADRON) 4 MG tablet, 8 mg, Oral, 2 times daily, 1 of 1 cycle, Start date: 11/16/2019, End date: -- ?pegfilgrastim (NEULASTA ONPRO KIT) injection 6 mg, 6 mg, Subcutaneous, Once, 6 of 6 cycles ?Administration: 6 mg (12/16/2019), 6 mg (01/06/2020), 6 mg (01/27/2020), 6 mg (02/17/2020), 6 mg (03/09/2020), 6 mg (03/30/2020) ?DOCEtaxel (TAXOTERE) 180 mg in sodium chloride 0.9 % 250 mL chemo infusion, 75 mg/m2 = 180 mg, Intravenous,  Once, 6 of 6 cycles ?Administration: 180 mg (12/16/2019), 180 mg (01/06/2020), 180 mg (01/27/2020), 180 mg (02/17/2020), 180 mg (03/09/2020), 180 mg (03/30/2020) ? ? for chemotherapy treatment.  ? ?  ? ? ?ALLERGIES:  has No Known Allergies. ? ?MEDICATIONS:  ?Current Facility-Administered Medications  ?Medication Dose Route Frequency Provider Last Rate Last Admin  ? 0.9 %  sodium chloride infusion  100 mL Intravenous PRN Breeze, Benancio Deeds, NP      ? 0.9 %  sodium chloride  infusion  100 mL Intravenous PRN Colon Flattery, NP      ? abiraterone acetate (ZYTIGA) tablet 1,000 mg  1,000 mg Oral Daily Agbata, Tochukwu, MD      ? acetaminophen (TYLENOL) tablet 650 mg  650 mg Oral Q6H PRN Agbata, Tochukwu, MD      ? Or  ? acetaminophen (TYLENOL) suppository 650 mg  650 mg Rectal Q6H PRN Agbata, Tochukwu, MD      ? alteplase (CATHFLO ACTIVASE) injection 2 mg  2 mg Intracatheter Once PRN Colon Flattery, NP      ? calcium carbonate (OS-CAL - dosed in mg of elemental calcium) tablet 1,250 mg  1,250 mg Oral BID WC Agbata, Tochukwu, MD      ? heparin ADULT infusion 100 units/mL (25000 units/221m)  1,350 Units/hr Intravenous Continuous POswald Hillock RPH   Stopped at 10/23/21 1309  ? heparin injection 1,000 Units  1,000 Units Dialysis PRN BColon Flattery NP      ? HYDROmorphone (DILAUDID) injection 1 mg  1 mg Intravenous Q4H PRN Agbata, Tochukwu, MD   1 mg at 10/23/21 1242  ? levothyroxine (SYNTHROID) tablet 200 mcg  200 mcg Oral Daily Agbata, Tochukwu, MD   200 mcg at 10/23/21 0611  ? lidocaine (PF) (XYLOCAINE) 1 % injection 5 mL  5 mL Intradermal PRN Colon Flattery, NP      ? lidocaine-prilocaine (EMLA) cream 1 application.  1 application. Topical PRN Colon Flattery, NP      ? ondansetron (ZOFRAN) tablet 4 mg  4 mg Oral Q6H PRN Agbata, Tochukwu, MD      ? Or  ? ondansetron (ZOFRAN) injection 4 mg  4 mg Intravenous Q6H PRN Agbata, Tochukwu, MD      ? pentafluoroprop-tetrafluoroeth (GEBAUERS) aerosol 1 application.  1 application. Topical PRN Colon Flattery, NP      ? traZODone (DESYREL) tablet 450 mg  450 mg Oral QHS Agbata, Tochukwu, MD   450 mg at 10/22/21 2108  ? ?Current Outpatient Medications  ?Medication Sig Dispense Refill  ? abiraterone acetate (ZYTIGA) 250 MG tablet Take 4 tablets (1,000 mg total) by mouth daily. Take on an empty stomach 1 hour before or 2 hours after a meal 120 tablet 0  ? calcium carbonate (OSCAL) 1500 (600 Ca) MG TABS tablet Take by mouth 2 (two)  times daily with a meal.    ? hyoscyamine (LEVSIN SL) 0.125 MG SL tablet Place 1 tablet (0.125 mg total) under the tongue every 4 (four) hours as needed (excess oral secretions). 42 tablet 0  ? Lactulose 20 GM/30

## 2021-10-23 NOTE — Progress Notes (Signed)
ED secretary to call EMS for transport.  ?

## 2021-10-23 NOTE — Hospital Course (Signed)
From H&P and prior notes. ? ?Dylan Ho is a 71 y.o. male with medical history significant for ESRD on HD (M/W/F), history of metastatic prostate cancer, hypothyroidism, hypertension, gout who presents to the ER via EMS for evaluation of weakness. ?Patient states that he was on his way to the hospital to get some scheduled imaging done and as he approached his vehicle he felt very weak and dropped to his knees.  He denies hitting his head or loss of consciousness.  EMS was called and when they arrived he was noted to be hypotensive with blood pressure in the 80s, patient was also tachycardic. ?He has significant swelling involving his right lower extremity. ?He denies having any chest pain, no shortness of breath, no nausea, no vomiting, no abdominal pain, no dizziness, no lightheadedness, no headache, no blurred vision or focal deficit. ?Patient has had poor oral intake but denies having any nausea or vomiting. ?He missed his scheduled dialysis on Friday because his access was not working but completed his session on Saturday. ?Right lower extremity venous Doppler shows evidence of a DVT and CT angiogram shows acute bilateral pulmonary embolism, there was also some concern of right heart strain.  He was initially started on heparin infusion and vascular surgery was also consulted. ? ?Patient is a dialysis patient and decided to withdraw treatment and would like to stay comfortable.  He will not be a candidate for any NOAC due to renal function.  Coumadin use can also be challenging due to persistent hematuria and overall prognosis. ?Discussed with patient and his wife and we agree not to do any anticoagulation at this time. ? ?Patient also had a CTA of abdomen and pelvis done which shows new bulky pelvic metastatic disease encasing the urinary bladder, bilateral ureterovesical junctions, right external iliac vein and extending throughout the space of Retzius and into the right inguinal canal. New moderate  bilateral hydroureteronephrosis due to malignant occlusion at the level of the UVJ bilaterally. Additional progression of widespread metastatic disease to the bones and bilateral lungs. ? ?Oncology was consulted and they have a long discussion with patient.  Apparently he also has a gene mutation which will not be able to get treated based on his renal function. ?Oncology has nothing much to offer at this time and after discussing with patient and wife they would like to withdraw all treatment and go home with hospice care. ? ?Patient was also recently started on dialysis will not continue further treatment which will limit his life expectancy to 7 to 10 days. ? ?Hospice care was consulted and patient is being discharged home with hospice for end-of-life care. ? ?Patient has a very poor prognosis and limited life expectancy. ? ?

## 2021-10-23 NOTE — Consult Note (Signed)
ANTICOAGULATION CONSULT NOTE - Initial Consult ? ?Pharmacy Consult for heparin ?Indication: pulmonary embolus and DVT ? ?No Known Allergies ? ?Patient Measurements: ?Height: '5\' 11"'$  (180.3 cm) ?Weight: 98.4 kg (217 lb) ?IBW/kg (Calculated) : 75.3 ?Heparin Dosing Weight: 95.4 kg ? ?Vital Signs: ?Temp: 98 ?F (36.7 ?C) (04/18 2108) ?Temp Source: Oral (04/18 2014) ?BP: 99/67 (04/19 0500) ?Pulse Rate: 78 (04/19 0500) ? ?Labs: ?Recent Labs  ?  10/22/21 ?1015 10/22/21 ?1305 10/22/21 ?2125 10/23/21 ?0600 10/23/21 ?6948  ?HGB 8.8*  --   --   --  7.0*  ?HCT 26.5*  --   --   --  22.4*  ?PLT 244  --   --   --  150  ?APTT  --  35  --   --   --   ?LABPROT 15.1  --   --   --   --   ?INR 1.2  --   --   --   --   ?HEPARINUNFRC  --   --  1.06* 0.51  --   ?CREATININE 7.32*  --   --   --   --   ? ? ? ?Estimated Creatinine Clearance: 11.5 mL/min (A) (by C-G formula based on SCr of 7.32 mg/dL (H)). ? ? ?Medical History: ?Past Medical History:  ?Diagnosis Date  ? Anemia   ? Arthritis   ? knees, shoulders  ? CAD (coronary artery disease)   ? No Stents Present- per patient  ? Cancer [facility])   ? Cirrhosis ([place])   ? mild  ? Colon polyps   ? Essential hypertension 08/24/2008  ? GERD (gastroesophageal reflux disease)   ? patient denies  ? History of alcohol abuse Last Drink- 2011  ? History of gout   ? Hypertension   ? no longer on meds  ? Hypothyroidism   ? Intestinal ulcer   ? Iron deficiency anemia due to chronic blood loss 02/27/2017  ? Other pancytopenia ([place]) 02/25/2017  ? Overactive bladder   ? Prostate cancer ([place])   ? Sleep apnea   ? improved since gastric bypass  ? Thyroid disease   ? ? ?Medications:  ?No PTA anticoagulation or antiplatelet therapy ? ?Assessment: ?71 y.o. male with past medical history including metastatic prostate cancer with mets to the bone, ESRD on HD, CAD, HLD and Von Willebrand disease who presents to the ED for weakness and a fall. CTA shows bilateral PE and evidence of right heart strain. Venous US significant  for extensive right lower extremity DVT. Pharmacy has been consulted for heparin dosing for new DVT and PE. Per MD, titrate heparin drip without bolus doses due to history of Von Willebrand disease. ? ?Date Time HL Rate/comment ?4/18  2125  1.06  1600 un/hr, supratherapeutic  ?4/19 0600 0.51 1350 un/hr, therapeutic  ? ?Goal of Therapy:  ?Heparin level 0.3-0.7 units/ml **NO bolus**  ?Monitor platelets by anticoagulation protocol: Yes ?  ?Plan:  ?Heparin level 0.51, therapeutic ?Continue heparin infusion at 1350 units/hr ?Check confirmatory heparin level in 8 hours ?Monitor daily CBC, s/s of bleed ? ? ?Darnelle Bos, PharmD ?10/23/2021,6:43 AM ? ? ?

## 2021-10-23 NOTE — Progress Notes (Signed)
?  10/23/21 1430  ?Clinical Encounter Type  ?Visited With Patient and family together  ?Visit Type Initial;Spiritual support;Social support  ?Spiritual Encounters  ?Spiritual Needs Prayer  ? ?Dylan Ho met with Pt and his brother, Dylan Ho. Chaplain B offered prayer at Pt's request. Chaplain B also invited sharing memories of growing up around [place] and what their earlier life as brothers was like. Chaplain B encouraged Pt to express his needs and ask others to support his needs (be that presence, privacy, music, etc.) as he goes home or other setting. ?

## 2021-10-23 NOTE — Consult Note (Signed)
Asked to evaluate patient for Bilateral pulmonary emboli and DVT. ?Patient with ESRD, Metastatic Prostate CA. ?Per notes patient is now Palliative and Hospice care. ? ?Therefore, no Vascular Surgery Intervention recommended. ?

## 2021-10-23 NOTE — Discharge Summary (Signed)
?Physician Discharge Summary ?  ?Patient: Dylan Ho MRN: [id number] DOB: [birth year]  ?Admit date:     10/22/2021  ?Discharge date: 10/23/21  ?Discharge Physician: Lorella Nimrod  ? ?PCP: Kirk Ruths, MD  ? ?Recommendations at discharge:  ?Patient is being discharged home with hospice. ? ?Discharge Diagnoses: ?Principal Problem: ?  DVT (deep venous thrombosis) ([place]) ?Active Problems: ?  Acute pulmonary embolism ([place]) ?  Prostate cancer ([place]) ?  Hypothyroidism ?  Cirrhosis of liver ([place]) ?  ESRD on dialysis [facility]) ?  Hypokalemia ? ?Resolved Problems: ?  * No resolved hospital problems. * ? ?Hospital Course: ?From H&P and prior notes. ? ?Dylan Ho is a 71 y.o. male with medical history significant for ESRD on HD (M/W/F), history of metastatic prostate cancer, hypothyroidism, hypertension, gout who presents to the ER via EMS for evaluation of weakness. ?Patient states that he was on his way to the hospital to get some scheduled imaging done and as he approached his vehicle he felt very weak and dropped to his knees.  He denies hitting his head or loss of consciousness.  EMS was called and when they arrived he was noted to be hypotensive with blood pressure in the 80s, patient was also tachycardic. ?He has significant swelling involving his right lower extremity. ?He denies having any chest pain, no shortness of breath, no nausea, no vomiting, no abdominal pain, no dizziness, no lightheadedness, no headache, no blurred vision or focal deficit. ?Patient has had poor oral intake but denies having any nausea or vomiting. ?He missed his scheduled dialysis on Friday because his access was not working but completed his session on Saturday. ?Right lower extremity venous Doppler shows evidence of a DVT and CT angiogram shows acute bilateral pulmonary embolism, there was also some concern of right heart strain.  He was initially started on heparin infusion and vascular surgery was also consulted. ? ?Patient is a  dialysis patient and decided to withdraw treatment and would like to stay comfortable.  He will not be a candidate for any NOAC due to renal function.  Coumadin use can also be challenging due to persistent hematuria and overall prognosis. ?Discussed with patient and his wife and we agree not to do any anticoagulation at this time. ? ?Patient also had a CTA of abdomen and pelvis done which shows new bulky pelvic metastatic disease encasing the urinary bladder, bilateral ureterovesical junctions, right external iliac vein and extending throughout the space of Retzius and into the right inguinal canal. New moderate bilateral hydroureteronephrosis due to malignant occlusion at the level of the UVJ bilaterally. Additional progression of widespread metastatic disease to the bones and bilateral lungs. ? ?Oncology was consulted and they have a long discussion with patient.  Apparently he also has a gene mutation which will not be able to get treated based on his renal function. ?Oncology has nothing much to offer at this time and after discussing with patient and wife they would like to withdraw all treatment and go home with hospice care. ? ?Patient was also recently started on dialysis will not continue further treatment which will limit his life expectancy to 7 to 10 days. ? ?Hospice care was consulted and patient is being discharged home with hospice for end-of-life care. ? ?Patient has a very poor prognosis and limited life expectancy. ? ? ?Assessment and Plan: ?* DVT (deep venous thrombosis) ([place]) ?Treatment as outlined in 1 ? ?Acute pulmonary embolism ([place]) ?Patient presented to the ER for evaluation of  weakness and was found to be hypotensive and tachycardic.  Also noted to have significant right lower extremity swelling. ?CT angiogram of the chest shows acute bilateral pulmonary embolism, moderate clot burden, right greater than left, extending most proximally to the right upper lobar level. CT evidence of right  heart strain (RV/LV Ratio = 1.1) consistent with at least submassive (intermediate risk) PE. ?Right lower extremity DVT is consistent with extensive right lower extremity DVT extending from the popliteal vein through the common femoral vein.  ?Venous thromboembolic disease appears to be related to underlying malignancy ?Continue heparin drip initiated in the ER ?Obtain 2D echocardiogram to assess RV strain ?Vascular surgery consult to evaluate for possible mechanical thrombectomy ?Oncology consult for recommendation for anticoagulation therapy ? 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 ?Imaging shows new bulky pelvic metastatic disease encasing the urinary bladder, bilateral ureterovesical junctions, right external iliac vein and extending throughout the space of Retzius and into the right ?inguinal canal. New moderate bilateral hydroureteronephrosis due to ?malignant occlusion at the level of the UVJ bilaterally. Additional progression of widespread metastatic disease to the bones and bilateral lungs. ?We will request oncology consult ?Supportive care ? ?Hypothyroidism ?Stable ?Continue Synthroid ? ?Cirrhosis of liver ([place]) ?Stable ? ?Hypokalemia ?Patient received 2 doses of IV potassium ?Check magnesium levels ? ?ESRD on dialysis [facility]) ?Dialysis days are M/W/F ?We will consult nephrology for renal replacement therapy during this hospitalization ? ? ?Consultants: Oncology, nephrology, vascular surgery ?Procedures performed: None ?Disposition: Hospice care ?Diet recommendation:  ?Discharge Diet Orders (From admission, onward)  ? ?  Start     Ordered  ? 10/23/21 0000  Diet - low sodium heart healthy       ? 10/23/21 1249  ? ?  ?  ? ?  ? ?Regular diet ?DISCHARGE MEDICATION: ?Allergies as of 10/23/2021   ?No Known Allergies ?  ? ?  ?Medication List  ?  ? ?STOP taking these medications   ? ?aspirin-acetaminophen-caffeine  250-250-65 MG tablet ?Commonly known as: [place] ?  ?hydrALAZINE 50 MG tablet ?Commonly known as: APRESOLINE ?  ?HYDROcodone-acetaminophen 5-325 MG tablet ?Commonly known as: NORCO/VICODIN ?  ?predniSONE 5 MG tablet ?Commonly known as: DELTASONE ?  ?solifenacin 10 MG tablet ?Commonly known as: VESICARE ?  ? ?  ? ?TAKE these medications   ? ?abiraterone acetate 250 MG tablet ?Commonly known as: ZYTIGA ?Take 4 tablets (1,000 mg total) by mouth daily. Take on an empty stomach 1 hour before or 2 hours after a meal ?  ?calcium carbonate 1500 (600 Ca) MG Tabs tablet ?Commonly known as: OSCAL ?Take by mouth 2 (two) times daily with a meal. ?  ?hyoscyamine 0.125 MG SL tablet ?Commonly known as: LEVSIN SL ?Place 1 tablet (0.125 mg total) under the tongue every 4 (four) hours as needed (excess oral secretions). ?  ?Lactulose 20 GM/30ML Soln ?Take 15-30 mLs (10-20 g total) by mouth daily as needed (constipation). ?What changed:  ?how much to take ?when to take this ?  ?levothyroxine 200 MCG tablet ?Commonly known as: SYNTHROID ?Take 200 mcg by mouth daily. ?  ?LORazepam 0.5 MG tablet ?Commonly known as: ATIVAN ?Take 1 tablet (0.5 mg total) by mouth every 4 (four) hours as needed for anxiety. May crush, mix with water and give sublingually if needed. ?  ?morphine 20 MG/ML concentrated solution ?Commonly known as: ROXANOL ?Take 0.25 mLs (5 mg total) by mouth every 4 (four)  hours as needed for shortness of breath. ?  ?ondansetron 4 MG tablet ?Commonly known as: ZOFRAN ?Take 1 tablet (4 mg total) by mouth every 6 (six) hours as needed for nausea. ?  ?oxyCODONE 5 MG immediate release tablet ?Commonly known as: Oxy IR/ROXICODONE ?Take 1 tablet (5 mg total) by mouth every 4 (four) hours as needed for severe pain. ?  ?pantoprazole 40 MG tablet ?Commonly known as: Protonix ?Take 1 tablet (40 mg total) by mouth daily. ?  ?senna 8.6 MG tablet ?Commonly known as: Senokot ?Take 2 tablets (17.2 mg total) by mouth 2 (two) times  daily. May crush, mix with water and give sublingually if needed. ?  ?traZODone 150 MG tablet ?Commonly known as: DESYREL ?Take 4 tablets (600 mg total) by mouth at bedtime. ?What changed: how much to tak

## 2021-10-23 NOTE — ED Notes (Signed)
Called [facility] for transport to pt's home ? ?

## 2021-10-23 NOTE — Progress Notes (Addendum)
Pt BP at 87/56 mAP 65 HR 80 but asymptomatic. NP Foust made aware. Will continue to monitor. ? ?Update 0133: NP Foust ordered 5 mg midodrine once. Latest BP at 82/51 MAP 62 HR 80. Will continue to monitor. ? ?Update 0400: BP at 97/67 MAP 76 HR 76. NP Foust made aware. Will continue to monitor. ?

## 2021-10-23 NOTE — TOC Transition Note (Signed)
Transition of Care (TOC) - CM/SW Discharge Note ? ? ?Patient Details  ?Name: Dylan Ho ?MRN: [id number] ?Date of Birth: 10/17/1950 ? ?Transition of Care (TOC) CM/SW Contact:  ?Candie Chroman, LCSW ?Phone Number: ?10/23/2021, 2:33 PM ? ? ?Clinical Narrative:   Patient has orders to discharge home with hospice today. RN arranged EMS transport. No further concerns. CSW signing off. ? ?Final next level of care: [place] ?Barriers to Discharge: No Barriers Identified ? ? ?Patient Goals and CMS Choice ?  ?  ?Choice offered to / list presented to : Patient, Spouse ? ?Discharge Placement ?  ?           ?  ?Patient to be transferred to facility by: EMS ?  ?Patient and family notified of of transfer: 10/23/21 ? ?Discharge Plan and Services ?  ?  ?Post Acute Care Choice: Durable Medical Equipment, Hospice          ?DME Arranged: Bedside commode ?  ?  ?  ?  ?  ?  ?  ?  ?  ? ?Social Determinants of Health (SDOH) Interventions ?  ? ? ?Readmission Risk Interventions ? ?  08/27/2021  ?  2:24 PM  ?Readmission Risk Prevention Plan  ?Transportation Screening Complete  ?Social Work Consult for [place] Planning/Counseling Complete  ?Palliative Care Screening Not Applicable  ?Medication Review Press photographer) Complete  ? ? ? ? ? ?

## 2021-10-23 NOTE — TOC Initial Note (Signed)
Transition of Care (TOC) - Initial/Assessment Note  ? ? ?Patient Details  ?Name: Dylan Ho ?MRN: [id number] ?Date of Birth: [birth year] ? ?Transition of Care (TOC) CM/SW Contact:    ?Candie Chroman, LCSW ?Phone Number: ?10/23/2021, 10:24 AM ? ?Clinical Narrative:  Per notes, plan for home with hospice services. CSW met with patient. Wife at bedside. CSW introduced role and inquired about interest in services. They confirmed. Reviewed list of home hospice agencies that serve [facility]. Preference is [facility]. Made referral to [facility]. They already have a walker at home and are requesting a bedside commode. They do not want a hospital bed. Patient will need EMS transport home. Address on facesheet is correct. No further concerns. CSW encouraged patient and his wife to contact CSW as needed. CSW will continue to follow patient and his wife for support and facilitate return home once released.               ? ?Expected Discharge Plan: [place] ?Barriers to Discharge:  (Hospice arrangements) ? ? ?Patient Goals and CMS Choice ?  ?  ?  ? ?Expected Discharge Plan and Services ?Expected Discharge Plan: [place] ?  ?  ?Post Acute Care Choice: Durable Medical Equipment, Hospice ?Living arrangements for the past 2 months: Apartment ?                ?  ?  ?  ?  ?  ?  ?  ?  ?  ?  ? ?Prior Living Arrangements/Services ?Living arrangements for the past 2 months: Apartment ?Lives with:: Spouse ?Patient language and need for interpreter reviewed:: Yes ?Do you feel safe going back to the place where you live?: Yes      ?Need for Family Participation in Patient Care: Yes (Comment) ?Care giver support system in place?: Yes (comment) ?Current home services: DME ?Criminal Activity/Legal Involvement Pertinent to Current Situation/Hospitalization: No - Comment as needed ? ?Activities of Daily Living ?Home Assistive Devices/Equipment: Gilford Rile (specify type) ?ADL Screening (condition at time of  admission) ?Patient's cognitive ability adequate to safely complete daily activities?: Yes ?Is the patient deaf or have difficulty hearing?: No ?Does the patient have difficulty seeing, even when wearing glasses/contacts?: No ?Does the patient have difficulty concentrating, remembering, or making decisions?: No ?Patient able to express need for assistance with ADLs?: Yes ?Does the patient have difficulty dressing or bathing?: Yes ?Independently performs ADLs?: Yes (appropriate for developmental age) ?Does the patient have difficulty walking or climbing stairs?: Yes ?Weakness of Legs: Both ?Weakness of Arms/Hands: Both ? ?Permission Sought/Granted ?Permission sought to share information with : Facility Sport and exercise psychologist, Family Supports ?Permission granted to share information with : Yes, Verbal Permission Granted ? Share Information with NAME: Helene Kelp Brindisi ? Permission granted to share info w AGENCY: [facility] ? Permission granted to share info w Relationship: Wife ? Permission granted to share info w Contact Information: [phone] ? ?Emotional Assessment ?Appearance:: Appears stated age ?Attitude/Demeanor/Rapport: Engaged, Gracious ?Affect (typically observed): Accepting, Appropriate, Calm, Pleasant ?Orientation: : Oriented to Self, Oriented to Place, Oriented to  Time, Oriented to Situation ?Alcohol / Substance Use: Not Applicable ?Psych Involvement: No (comment) ? ?Admission diagnosis:  DVT (deep venous thrombosis) ([place]) [I82.409] ?Patient Active Problem List  ? Diagnosis Date Noted  ? DVT (deep venous thrombosis) ([place]) 10/22/2021  ? Acute pulmonary embolism ([place]) 10/22/2021  ? Hypokalemia 10/22/2021  ? ESRD on dialysis ([place]) 10/18/2021  ? AKI (acute kidney injury) ([place]) 08/22/2021  ? Prostate  cancer metastatic to bone ([place]) 11/16/2019  ? Goals of care, counseling/discussion 11/16/2019  ? History of arthroscopy of knee 05/02/2019  ? Tear of meniscus of knee 01/28/2019  ? Constipation 07/09/2017  ?  Impingement syndrome of right shoulder region 05/13/2017  ? Iron deficiency anemia due to chronic blood loss 02/27/2017  ? B12 deficiency 01/09/2017  ? 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 Osteoarthritis of both knees 01/09/2017  ? Insomnia 12/12/2016  ? Hx of gastric bypass 12/12/2016  ? Cirrhosis of liver ([place]) 11/19/2016  ? Positive Helicobacter pylori serology 11/19/2016  ? Sleep apnea 11/19/2016  ? Von Willebrand disease 11/19/2016  ? Anastomotic ulcer S/P gastric bypass   ? Obesity (BMI 30.0-34.9) 01/19/2014  ? Nondependent alcohol abuse 01/11/2010  ? Hypothyroidism 01/11/2010  ? Hyperlipidemia 01/11/2010  ? Coronary atherosclerosis of native coronary artery 09/22/2008  ? ?PCP:  Kirk Ruths, MD ?Pharmacy:   ?[facility] [address] ?[place] [address] ?Phone: [phone] Fax: [phone] ? ?[facility] [place] [facility] [place] ?[address] ?[place] [address] ?Phone: [phone] Fax: [phone] ? ?[facility] ?[address] ?[place] [address] ?Phone: [phone] Fax: [phone] ? ? ? ? ?Social Determinants of Health (SDOH) Interventions ?  ? ?Readmission Risk Interventions ? ?  08/27/2021  ?  2:24 PM  ?Readmission Risk Prevention Plan  ?Transportation Screening Complete  ?Social Work Consult for [place] Planning/Counseling Complete  ?Palliative Care Screening Not Applicable  ?Medication Review Press photographer) Complete  ? ? ? ?

## 2021-10-23 NOTE — ED Notes (Signed)
Pt meal tray sat at bedside. Family remains in room with pt. Pt resting comfortably at this time.  ?

## 2021-10-23 NOTE — H&P (View-Only) (Signed)
Asked to evaluate patient for Bilateral pulmonary emboli and DVT. ?Patient with ESRD, Metastatic Prostate CA. ?Per notes patient is now Palliative and Hospice care. ? ?Therefore, no Vascular Surgery Intervention recommended. ?

## 2021-10-23 NOTE — ED Notes (Signed)
Port remains accessed per hospice nurse instructions upon discharge ?

## 2021-10-23 NOTE — Progress Notes (Signed)
Patient resting quietly in bed.

## 2021-10-24 ENCOUNTER — Telehealth: Payer: Self-pay | Admitting: Emergency Medicine

## 2021-10-24 ENCOUNTER — Other Ambulatory Visit: Payer: Self-pay | Admitting: *Deleted

## 2021-10-24 ENCOUNTER — Ambulatory Visit: Payer: PPO

## 2021-10-24 ENCOUNTER — Other Ambulatory Visit (HOSPITAL_COMMUNITY): Payer: Self-pay

## 2021-10-24 NOTE — Telephone Encounter (Signed)
Hospice nurse Hoyle Sauer called reporting that patient states that the Oxycodone 5 mg is not controlling his pain. He is rating it at 8-9/10 and is asking for an increase or change in medicine. Please advise ?

## 2021-10-24 NOTE — Telephone Encounter (Signed)
Called spouse to inform the patient's DNR form was at the front desk to pick up at their convenience.  ?

## 2021-10-25 ENCOUNTER — Encounter: Payer: Self-pay | Admitting: Oncology

## 2021-10-25 ENCOUNTER — Other Ambulatory Visit: Payer: Self-pay | Admitting: *Deleted

## 2021-10-25 ENCOUNTER — Other Ambulatory Visit (HOSPITAL_COMMUNITY): Payer: Self-pay

## 2021-10-25 NOTE — Telephone Encounter (Signed)
Dylan Ho informed of dose change and she asking we send prescription to pharmacy as he will not have enough to last the weekend. Sent request to Dr Janese Banks  ?

## 2021-10-27 LAB — CULTURE, BLOOD (ROUTINE X 2)
Culture: NO GROWTH
Culture: NO GROWTH

## 2021-10-28 NOTE — Interval H&P Note (Signed)
History and Physical Interval Note: ? ?10/28/2021 ?2:58 PM ? ?Dylan Ho  has presented today for surgery, with the diagnosis of End stage renal disease.  The various methods of treatment have been discussed with the patient and family. After consideration of risks, benefits and other options for treatment, the patient has consented to  Procedure(s): ?DIALYSIS/PERMA CATHETER INSERTION (N/A) as a surgical intervention.  The patient's history has been reviewed, patient examined, no change in status, stable for surgery.  I have reviewed the patient's chart and labs.  Questions were answered to the patient's satisfaction.   ? ? ?Leotis Pain ? ? ?

## 2021-10-29 ENCOUNTER — Ambulatory Visit: Payer: PPO | Admitting: Oncology

## 2021-10-29 ENCOUNTER — Inpatient Hospital Stay: Payer: PPO | Admitting: Pharmacist

## 2021-10-29 ENCOUNTER — Other Ambulatory Visit: Payer: PPO

## 2021-10-29 ENCOUNTER — Inpatient Hospital Stay: Payer: PPO

## 2021-10-29 ENCOUNTER — Ambulatory Visit: Payer: PPO

## 2021-10-29 ENCOUNTER — Inpatient Hospital Stay: Payer: PPO | Admitting: Oncology

## 2021-10-30 ENCOUNTER — Telehealth: Payer: Self-pay | Admitting: *Deleted

## 2021-10-30 NOTE — Telephone Encounter (Signed)
Hospice asking for order to deaccess patient port a cath ?

## 2021-10-31 ENCOUNTER — Other Ambulatory Visit: Payer: Self-pay | Admitting: Hospice and Palliative Medicine

## 2021-10-31 MED ORDER — FENTANYL 12 MCG/HR TD PT72: 1.0000 | MEDICATED_PATCH | TRANSDERMAL | 0 refills | Status: DC

## 2021-10-31 NOTE — Progress Notes (Signed)
I spoke with patient's hospice nurse. Patient is using oxycodone '10mg'$  Q3-4 hours around the clock. Pain is poorly controlled. Will start on transdermal fentanyl at 12.35mg Q72H ?

## 2021-11-01 ENCOUNTER — Encounter: Payer: Self-pay | Admitting: Oncology

## 2021-11-01 NOTE — Telephone Encounter (Signed)
Verbal order called to Triage nurse [place] as I was unable to reach [place] ?

## 2021-11-03 DIAGNOSIS — Z992 Dependence on renal dialysis: Secondary | ICD-10-CM | POA: Diagnosis not present

## 2021-11-03 DIAGNOSIS — N186 End stage renal disease: Secondary | ICD-10-CM | POA: Diagnosis not present

## 2021-11-07 ENCOUNTER — Other Ambulatory Visit: Payer: Self-pay | Admitting: Hospice and Palliative Medicine

## 2021-11-07 MED ORDER — OXYCODONE HCL 10 MG PO TABS: 10.0000 mg | ORAL_TABLET | ORAL | 0 refills | Status: AC | PRN

## 2021-11-07 MED ORDER — FENTANYL 25 MCG/HR TD PT72: 1.0000 | MEDICATED_PATCH | TRANSDERMAL | 0 refills | Status: AC

## 2021-11-07 NOTE — Progress Notes (Signed)
I spoke with hospice nurse. Pain is poorly controlled on oxycodone and fentanyl. He needs refills on both. Will increase fentanyl to 74mg Q72H and increase oxycodone to 10-'20mg'$  Q3H PRN. Would not use morphine given risk of neurotoxicity due to ESRD.  ?

## 2021-12-03 ENCOUNTER — Encounter (INDEPENDENT_AMBULATORY_CARE_PROVIDER_SITE_OTHER): Payer: PPO | Admitting: Vascular Surgery

## 2021-12-05 DEATH — deceased

## 2022-03-06 ENCOUNTER — Other Ambulatory Visit (HOSPITAL_COMMUNITY): Payer: Self-pay
